# Patient Record
Sex: Female | Born: 1935
Health system: Southern US, Community
[De-identification: ages and names within clinical notes are randomized; demographics above are authoritative.]

## PROBLEM LIST (undated history)

## (undated) DIAGNOSIS — I701 Atherosclerosis of renal artery: Secondary | ICD-10-CM

## (undated) DIAGNOSIS — M1990 Unspecified osteoarthritis, unspecified site: Secondary | ICD-10-CM

## (undated) DIAGNOSIS — G8929 Other chronic pain: Secondary | ICD-10-CM

## (undated) DIAGNOSIS — J449 Chronic obstructive pulmonary disease, unspecified: Secondary | ICD-10-CM

## (undated) DIAGNOSIS — M545 Low back pain, unspecified: Secondary | ICD-10-CM

## (undated) DIAGNOSIS — R51 Headache: Secondary | ICD-10-CM

## (undated) DIAGNOSIS — J189 Pneumonia, unspecified organism: Secondary | ICD-10-CM

## (undated) DIAGNOSIS — D649 Anemia, unspecified: Secondary | ICD-10-CM

## (undated) DIAGNOSIS — K219 Gastro-esophageal reflux disease without esophagitis: Secondary | ICD-10-CM

## (undated) DIAGNOSIS — I10 Essential (primary) hypertension: Secondary | ICD-10-CM

## (undated) DIAGNOSIS — G43909 Migraine, unspecified, not intractable, without status migrainosus: Secondary | ICD-10-CM

## (undated) DIAGNOSIS — E781 Pure hyperglyceridemia: Secondary | ICD-10-CM

## (undated) DIAGNOSIS — H269 Unspecified cataract: Secondary | ICD-10-CM

## (undated) DIAGNOSIS — R519 Headache, unspecified: Secondary | ICD-10-CM

## (undated) DIAGNOSIS — J42 Unspecified chronic bronchitis: Secondary | ICD-10-CM

## (undated) HISTORY — DX: Unspecified cataract: H26.9

## (undated) HISTORY — DX: Essential (primary) hypertension: I10

## (undated) HISTORY — PX: CATARACT EXTRACTION, BILATERAL: SHX1313

## (undated) HISTORY — DX: Atherosclerosis of renal artery: I70.1

## (undated) HISTORY — DX: Unspecified chronic bronchitis: J42

## (undated) HISTORY — PX: TOE SURGERY: SHX1073

## (undated) HISTORY — PX: RENAL ANGIOPLASTY: SHX2316

## (undated) HISTORY — PX: BALLOON DILATION: SHX5330

## (undated) HISTORY — PX: TONSILLECTOMY: SUR1361

---

## 1971-12-01 HISTORY — PX: VAGINAL HYSTERECTOMY: SUR661

## 1999-01-03 ENCOUNTER — Other Ambulatory Visit: Admission: RE | Admit: 1999-01-03 | Discharge: 1999-01-03 | Payer: Self-pay | Admitting: Gastroenterology

## 1999-01-20 ENCOUNTER — Ambulatory Visit (HOSPITAL_COMMUNITY): Admission: RE | Admit: 1999-01-20 | Discharge: 1999-01-20 | Payer: Self-pay | Admitting: Gastroenterology

## 2000-03-29 ENCOUNTER — Other Ambulatory Visit: Admission: RE | Admit: 2000-03-29 | Discharge: 2000-03-29 | Payer: Self-pay | Admitting: Obstetrics and Gynecology

## 2002-06-26 ENCOUNTER — Ambulatory Visit (HOSPITAL_COMMUNITY): Admission: RE | Admit: 2002-06-26 | Discharge: 2002-06-26 | Payer: Self-pay | Admitting: Gastroenterology

## 2002-07-03 ENCOUNTER — Encounter: Admission: RE | Admit: 2002-07-03 | Discharge: 2002-07-03 | Payer: Self-pay | Admitting: Obstetrics and Gynecology

## 2002-07-03 ENCOUNTER — Encounter: Payer: Self-pay | Admitting: Obstetrics and Gynecology

## 2009-04-23 ENCOUNTER — Encounter: Admission: RE | Admit: 2009-04-23 | Discharge: 2009-04-23 | Payer: Self-pay | Admitting: Gastroenterology

## 2011-12-29 DIAGNOSIS — Z124 Encounter for screening for malignant neoplasm of cervix: Secondary | ICD-10-CM | POA: Diagnosis not present

## 2011-12-29 DIAGNOSIS — Z0000 Encounter for general adult medical examination without abnormal findings: Secondary | ICD-10-CM | POA: Diagnosis not present

## 2011-12-29 DIAGNOSIS — Z01419 Encounter for gynecological examination (general) (routine) without abnormal findings: Secondary | ICD-10-CM | POA: Diagnosis not present

## 2012-02-08 DIAGNOSIS — D126 Benign neoplasm of colon, unspecified: Secondary | ICD-10-CM | POA: Diagnosis not present

## 2012-02-08 DIAGNOSIS — R7301 Impaired fasting glucose: Secondary | ICD-10-CM | POA: Diagnosis not present

## 2012-02-08 DIAGNOSIS — I10 Essential (primary) hypertension: Secondary | ICD-10-CM | POA: Diagnosis not present

## 2012-02-08 DIAGNOSIS — E785 Hyperlipidemia, unspecified: Secondary | ICD-10-CM | POA: Diagnosis not present

## 2012-02-19 DIAGNOSIS — Z1231 Encounter for screening mammogram for malignant neoplasm of breast: Secondary | ICD-10-CM | POA: Diagnosis not present

## 2012-04-22 DIAGNOSIS — B079 Viral wart, unspecified: Secondary | ICD-10-CM | POA: Diagnosis not present

## 2012-04-22 DIAGNOSIS — B351 Tinea unguium: Secondary | ICD-10-CM | POA: Diagnosis not present

## 2012-04-22 DIAGNOSIS — L821 Other seborrheic keratosis: Secondary | ICD-10-CM | POA: Diagnosis not present

## 2012-08-09 DIAGNOSIS — I10 Essential (primary) hypertension: Secondary | ICD-10-CM | POA: Diagnosis not present

## 2012-08-09 DIAGNOSIS — M810 Age-related osteoporosis without current pathological fracture: Secondary | ICD-10-CM | POA: Diagnosis not present

## 2012-08-09 DIAGNOSIS — Z0000 Encounter for general adult medical examination without abnormal findings: Secondary | ICD-10-CM | POA: Diagnosis not present

## 2012-08-09 DIAGNOSIS — R7301 Impaired fasting glucose: Secondary | ICD-10-CM | POA: Diagnosis not present

## 2012-08-09 DIAGNOSIS — E785 Hyperlipidemia, unspecified: Secondary | ICD-10-CM | POA: Diagnosis not present

## 2012-08-09 DIAGNOSIS — D649 Anemia, unspecified: Secondary | ICD-10-CM | POA: Diagnosis not present

## 2012-08-12 DIAGNOSIS — Z0000 Encounter for general adult medical examination without abnormal findings: Secondary | ICD-10-CM | POA: Diagnosis not present

## 2012-08-12 DIAGNOSIS — R0602 Shortness of breath: Secondary | ICD-10-CM | POA: Diagnosis not present

## 2012-08-12 DIAGNOSIS — J209 Acute bronchitis, unspecified: Secondary | ICD-10-CM | POA: Diagnosis not present

## 2012-08-12 DIAGNOSIS — I10 Essential (primary) hypertension: Secondary | ICD-10-CM | POA: Diagnosis not present

## 2012-08-16 DIAGNOSIS — Z1212 Encounter for screening for malignant neoplasm of rectum: Secondary | ICD-10-CM | POA: Diagnosis not present

## 2012-09-12 DIAGNOSIS — Z23 Encounter for immunization: Secondary | ICD-10-CM | POA: Diagnosis not present

## 2012-10-25 DIAGNOSIS — Z85828 Personal history of other malignant neoplasm of skin: Secondary | ICD-10-CM | POA: Diagnosis not present

## 2012-10-25 DIAGNOSIS — D1801 Hemangioma of skin and subcutaneous tissue: Secondary | ICD-10-CM | POA: Diagnosis not present

## 2012-10-25 DIAGNOSIS — L821 Other seborrheic keratosis: Secondary | ICD-10-CM | POA: Diagnosis not present

## 2012-10-25 DIAGNOSIS — L820 Inflamed seborrheic keratosis: Secondary | ICD-10-CM | POA: Diagnosis not present

## 2012-10-25 DIAGNOSIS — L570 Actinic keratosis: Secondary | ICD-10-CM | POA: Diagnosis not present

## 2012-12-12 DIAGNOSIS — R7301 Impaired fasting glucose: Secondary | ICD-10-CM | POA: Diagnosis not present

## 2012-12-12 DIAGNOSIS — D649 Anemia, unspecified: Secondary | ICD-10-CM | POA: Diagnosis not present

## 2012-12-12 DIAGNOSIS — E785 Hyperlipidemia, unspecified: Secondary | ICD-10-CM | POA: Diagnosis not present

## 2012-12-12 DIAGNOSIS — R002 Palpitations: Secondary | ICD-10-CM | POA: Diagnosis not present

## 2012-12-27 DIAGNOSIS — H2510 Age-related nuclear cataract, unspecified eye: Secondary | ICD-10-CM | POA: Diagnosis not present

## 2012-12-30 DIAGNOSIS — R339 Retention of urine, unspecified: Secondary | ICD-10-CM | POA: Diagnosis not present

## 2012-12-30 DIAGNOSIS — Z01419 Encounter for gynecological examination (general) (routine) without abnormal findings: Secondary | ICD-10-CM | POA: Diagnosis not present

## 2012-12-30 DIAGNOSIS — Z124 Encounter for screening for malignant neoplasm of cervix: Secondary | ICD-10-CM | POA: Diagnosis not present

## 2013-01-23 DIAGNOSIS — H269 Unspecified cataract: Secondary | ICD-10-CM | POA: Diagnosis not present

## 2013-01-23 DIAGNOSIS — H2510 Age-related nuclear cataract, unspecified eye: Secondary | ICD-10-CM | POA: Diagnosis not present

## 2013-01-24 DIAGNOSIS — H2510 Age-related nuclear cataract, unspecified eye: Secondary | ICD-10-CM | POA: Diagnosis not present

## 2013-02-04 ENCOUNTER — Ambulatory Visit (INDEPENDENT_AMBULATORY_CARE_PROVIDER_SITE_OTHER): Payer: Medicare Other | Admitting: Internal Medicine

## 2013-02-04 VITALS — BP 150/62 | HR 90 | Temp 98.0°F | Resp 18 | Ht 62.0 in | Wt 95.0 lb

## 2013-02-04 DIAGNOSIS — R1310 Dysphagia, unspecified: Secondary | ICD-10-CM | POA: Diagnosis not present

## 2013-02-04 DIAGNOSIS — I10 Essential (primary) hypertension: Secondary | ICD-10-CM

## 2013-02-04 DIAGNOSIS — R0602 Shortness of breath: Secondary | ICD-10-CM | POA: Diagnosis not present

## 2013-02-04 DIAGNOSIS — T17208A Unspecified foreign body in pharynx causing other injury, initial encounter: Secondary | ICD-10-CM

## 2013-02-04 DIAGNOSIS — R070 Pain in throat: Secondary | ICD-10-CM | POA: Diagnosis not present

## 2013-02-06 DIAGNOSIS — I10 Essential (primary) hypertension: Secondary | ICD-10-CM | POA: Insufficient documentation

## 2013-02-06 NOTE — Progress Notes (Signed)
  Subjective:    Patient ID: Jessica [name], female    DOB: [birth year], 79 y.o.   MRN: [id number]  HPI called by staff state patient urgently because of her inability to speak and anxiety Actually she is hoarse/able to whisper/ complaining of pain in throat She swallowed an iron tablet which got lodged in her throat She began having a burning sensation and was coughing and coughed up several fragments of this pill over the next 30 or 40 minutes She continues to describe pain in her throat which gets worse with trying to swallow She is not short of breath  There is no problem list on file for this patient.  Prior to Admission medications   Medication Sig Start Date End Date Taking? Authorizing Provider  fenofibrate 160 MG tablet Take 160 mg by mouth daily.   Yes Historical Provider, MD  iron polysaccharides (NIFEREX) 150 MG capsule Take 150 mg by mouth 2 (two) times daily.   Yes Historical Provider, MD  mirtazapine (REMERON) 15 MG tablet Take 15 mg by mouth at bedtime.   Yes Historical Provider, MD  propranolol (INDERAL) 20 MG tablet Take 15 mg by mouth 3 (three) times daily.   Yes Historical Provider, MD  quinapril (ACCUPRIL) 20 MG tablet Take 20 mg by mouth at bedtime.   Yes Historical Provider, MD   Review of Systems Delayed due to urgency    Objective:   Physical Exam BP 150/62  Pulse 90  Temp(Src) 98 F (36.7 C)  Resp 18  Ht 5\' 2"  (1.575 m)  Wt 95 lb (43.092 kg)  BMI 17.37 kg/m2  SpO2 98% TMs clear/nares clear/oral pharynx clear/no thyromegaly/no nodes/neck has a good range of motion Chest-moving air well in all lungs Has tremendous rhonchi over the neck and anterior chest particularly expiration/no stridor No use of accessory muscles Heart regular without murmur  5 cc of viscous Xylocaine given to swallow No change in symptoms so this was repeated  Reports less discomfort in response/rhonchi  are still present though diminished She is more calm  Had her sip 30 cc of  children's Motrin slowly over 30 minutes Reexam-no longer complaining of pain/breathing easily/voice still hoarse but improving Rhonchi have resolved       Assessment & Plan:  Pain in throat  Shortness of breath  Foreign body in throat, initial encounter  Dysphagia, unspecified  Advised to liquids and soft diet for the next 24 hours No further pills except her blood pressure medicines for the next 2 days Followup with primary care/followup here tomorrow at if not stable

## 2013-02-07 DIAGNOSIS — I10 Essential (primary) hypertension: Secondary | ICD-10-CM | POA: Diagnosis not present

## 2013-02-07 DIAGNOSIS — R1310 Dysphagia, unspecified: Secondary | ICD-10-CM | POA: Diagnosis not present

## 2013-02-07 DIAGNOSIS — K219 Gastro-esophageal reflux disease without esophagitis: Secondary | ICD-10-CM | POA: Diagnosis not present

## 2013-02-07 DIAGNOSIS — J040 Acute laryngitis: Secondary | ICD-10-CM | POA: Diagnosis not present

## 2013-02-07 DIAGNOSIS — R490 Dysphonia: Secondary | ICD-10-CM | POA: Diagnosis not present

## 2013-02-09 DIAGNOSIS — R490 Dysphonia: Secondary | ICD-10-CM | POA: Diagnosis not present

## 2013-02-09 DIAGNOSIS — J020 Streptococcal pharyngitis: Secondary | ICD-10-CM | POA: Diagnosis not present

## 2013-02-09 DIAGNOSIS — K219 Gastro-esophageal reflux disease without esophagitis: Secondary | ICD-10-CM | POA: Diagnosis not present

## 2013-02-09 DIAGNOSIS — J029 Acute pharyngitis, unspecified: Secondary | ICD-10-CM | POA: Diagnosis not present

## 2013-02-09 DIAGNOSIS — R070 Pain in throat: Secondary | ICD-10-CM | POA: Diagnosis not present

## 2013-02-14 DIAGNOSIS — D380 Neoplasm of uncertain behavior of larynx: Secondary | ICD-10-CM | POA: Diagnosis not present

## 2013-02-14 DIAGNOSIS — J040 Acute laryngitis: Secondary | ICD-10-CM | POA: Diagnosis not present

## 2013-02-14 DIAGNOSIS — R490 Dysphonia: Secondary | ICD-10-CM | POA: Diagnosis not present

## 2013-02-14 DIAGNOSIS — J384 Edema of larynx: Secondary | ICD-10-CM | POA: Diagnosis not present

## 2013-02-20 DIAGNOSIS — J384 Edema of larynx: Secondary | ICD-10-CM | POA: Diagnosis not present

## 2013-02-20 DIAGNOSIS — J040 Acute laryngitis: Secondary | ICD-10-CM | POA: Diagnosis not present

## 2013-03-06 DIAGNOSIS — H2510 Age-related nuclear cataract, unspecified eye: Secondary | ICD-10-CM | POA: Diagnosis not present

## 2013-03-06 DIAGNOSIS — H52209 Unspecified astigmatism, unspecified eye: Secondary | ICD-10-CM | POA: Diagnosis not present

## 2013-03-06 DIAGNOSIS — H269 Unspecified cataract: Secondary | ICD-10-CM | POA: Diagnosis not present

## 2013-03-08 DIAGNOSIS — J383 Other diseases of vocal cords: Secondary | ICD-10-CM | POA: Diagnosis not present

## 2013-03-16 DIAGNOSIS — Z1231 Encounter for screening mammogram for malignant neoplasm of breast: Secondary | ICD-10-CM | POA: Diagnosis not present

## 2013-04-03 ENCOUNTER — Other Ambulatory Visit: Payer: Self-pay | Admitting: Nurse Practitioner

## 2013-04-03 NOTE — Telephone Encounter (Signed)
Patient requests rx for Estrace vaginal cream/[facility] [address] [phone].

## 2013-04-03 NOTE — Telephone Encounter (Signed)
Refill request for ESTRACE CREAM Last filled-12/29/11, #1 X 12 Last AEX - 12/12/12 Next AEX - not scheduled Please advise refills.

## 2013-04-04 MED ORDER — ESTRADIOL 0.1 MG/GM VA CREA: TOPICAL_CREAM | VAGINAL | Status: DC

## 2013-04-04 NOTE — Telephone Encounter (Signed)
Ok to refill vaginal estrogen for the 3 months and she can come in the interim for AEX.

## 2013-04-04 NOTE — Telephone Encounter (Signed)
Pt notified and RX sent. 

## 2013-04-19 DIAGNOSIS — R498 Other voice and resonance disorders: Secondary | ICD-10-CM | POA: Diagnosis not present

## 2013-04-19 DIAGNOSIS — J383 Other diseases of vocal cords: Secondary | ICD-10-CM | POA: Diagnosis not present

## 2013-04-19 DIAGNOSIS — R490 Dysphonia: Secondary | ICD-10-CM | POA: Diagnosis not present

## 2013-06-26 DIAGNOSIS — L820 Inflamed seborrheic keratosis: Secondary | ICD-10-CM | POA: Diagnosis not present

## 2013-06-26 DIAGNOSIS — L821 Other seborrheic keratosis: Secondary | ICD-10-CM | POA: Diagnosis not present

## 2013-06-26 DIAGNOSIS — L819 Disorder of pigmentation, unspecified: Secondary | ICD-10-CM | POA: Diagnosis not present

## 2013-06-26 DIAGNOSIS — Z85828 Personal history of other malignant neoplasm of skin: Secondary | ICD-10-CM | POA: Diagnosis not present

## 2013-06-26 DIAGNOSIS — D1801 Hemangioma of skin and subcutaneous tissue: Secondary | ICD-10-CM | POA: Diagnosis not present

## 2013-06-26 DIAGNOSIS — L570 Actinic keratosis: Secondary | ICD-10-CM | POA: Diagnosis not present

## 2013-06-26 DIAGNOSIS — L719 Rosacea, unspecified: Secondary | ICD-10-CM | POA: Diagnosis not present

## 2013-08-11 DIAGNOSIS — R7301 Impaired fasting glucose: Secondary | ICD-10-CM | POA: Diagnosis not present

## 2013-08-11 DIAGNOSIS — M899 Disorder of bone, unspecified: Secondary | ICD-10-CM | POA: Diagnosis not present

## 2013-08-11 DIAGNOSIS — E785 Hyperlipidemia, unspecified: Secondary | ICD-10-CM | POA: Diagnosis not present

## 2013-08-11 DIAGNOSIS — I10 Essential (primary) hypertension: Secondary | ICD-10-CM | POA: Diagnosis not present

## 2013-08-24 DIAGNOSIS — M810 Age-related osteoporosis without current pathological fracture: Secondary | ICD-10-CM | POA: Diagnosis not present

## 2013-08-24 DIAGNOSIS — I701 Atherosclerosis of renal artery: Secondary | ICD-10-CM | POA: Diagnosis not present

## 2013-08-24 DIAGNOSIS — F329 Major depressive disorder, single episode, unspecified: Secondary | ICD-10-CM | POA: Diagnosis not present

## 2013-08-24 DIAGNOSIS — Z0000 Encounter for general adult medical examination without abnormal findings: Secondary | ICD-10-CM | POA: Diagnosis not present

## 2013-08-24 DIAGNOSIS — Z23 Encounter for immunization: Secondary | ICD-10-CM | POA: Diagnosis not present

## 2013-08-24 DIAGNOSIS — F3289 Other specified depressive episodes: Secondary | ICD-10-CM | POA: Diagnosis not present

## 2013-08-24 DIAGNOSIS — D126 Benign neoplasm of colon, unspecified: Secondary | ICD-10-CM | POA: Diagnosis not present

## 2013-08-24 DIAGNOSIS — R002 Palpitations: Secondary | ICD-10-CM | POA: Diagnosis not present

## 2013-08-24 DIAGNOSIS — D649 Anemia, unspecified: Secondary | ICD-10-CM | POA: Diagnosis not present

## 2013-08-24 DIAGNOSIS — E785 Hyperlipidemia, unspecified: Secondary | ICD-10-CM | POA: Diagnosis not present

## 2013-09-13 DIAGNOSIS — Z1212 Encounter for screening for malignant neoplasm of rectum: Secondary | ICD-10-CM | POA: Diagnosis not present

## 2013-10-17 DIAGNOSIS — Z961 Presence of intraocular lens: Secondary | ICD-10-CM | POA: Diagnosis not present

## 2013-10-23 DIAGNOSIS — J383 Other diseases of vocal cords: Secondary | ICD-10-CM | POA: Diagnosis not present

## 2014-02-21 DIAGNOSIS — F329 Major depressive disorder, single episode, unspecified: Secondary | ICD-10-CM | POA: Diagnosis not present

## 2014-02-21 DIAGNOSIS — E785 Hyperlipidemia, unspecified: Secondary | ICD-10-CM | POA: Diagnosis not present

## 2014-02-21 DIAGNOSIS — G43909 Migraine, unspecified, not intractable, without status migrainosus: Secondary | ICD-10-CM | POA: Diagnosis not present

## 2014-02-21 DIAGNOSIS — F3289 Other specified depressive episodes: Secondary | ICD-10-CM | POA: Diagnosis not present

## 2014-02-21 DIAGNOSIS — R7301 Impaired fasting glucose: Secondary | ICD-10-CM | POA: Diagnosis not present

## 2014-02-21 DIAGNOSIS — I701 Atherosclerosis of renal artery: Secondary | ICD-10-CM | POA: Diagnosis not present

## 2014-02-21 DIAGNOSIS — Z1331 Encounter for screening for depression: Secondary | ICD-10-CM | POA: Diagnosis not present

## 2014-02-21 DIAGNOSIS — M1990 Unspecified osteoarthritis, unspecified site: Secondary | ICD-10-CM | POA: Diagnosis not present

## 2014-02-21 DIAGNOSIS — I10 Essential (primary) hypertension: Secondary | ICD-10-CM | POA: Diagnosis not present

## 2014-03-27 DIAGNOSIS — Z1231 Encounter for screening mammogram for malignant neoplasm of breast: Secondary | ICD-10-CM | POA: Diagnosis not present

## 2014-04-25 DIAGNOSIS — R0982 Postnasal drip: Secondary | ICD-10-CM | POA: Insufficient documentation

## 2014-04-25 DIAGNOSIS — J384 Edema of larynx: Secondary | ICD-10-CM | POA: Diagnosis not present

## 2014-04-25 DIAGNOSIS — R1310 Dysphagia, unspecified: Secondary | ICD-10-CM | POA: Insufficient documentation

## 2014-04-25 DIAGNOSIS — J383 Other diseases of vocal cords: Secondary | ICD-10-CM | POA: Diagnosis not present

## 2014-04-25 DIAGNOSIS — J329 Chronic sinusitis, unspecified: Secondary | ICD-10-CM | POA: Diagnosis not present

## 2014-05-09 ENCOUNTER — Ambulatory Visit (INDEPENDENT_AMBULATORY_CARE_PROVIDER_SITE_OTHER): Payer: Medicare Other | Admitting: Nurse Practitioner

## 2014-05-09 ENCOUNTER — Encounter: Payer: Self-pay | Admitting: Nurse Practitioner

## 2014-05-09 VITALS — BP 108/66 | HR 60 | Ht 61.5 in | Wt 95.0 lb

## 2014-05-09 DIAGNOSIS — R519 Headache, unspecified: Secondary | ICD-10-CM

## 2014-05-09 DIAGNOSIS — M545 Low back pain, unspecified: Secondary | ICD-10-CM | POA: Diagnosis not present

## 2014-05-09 DIAGNOSIS — R51 Headache: Secondary | ICD-10-CM | POA: Diagnosis not present

## 2014-05-09 DIAGNOSIS — Z124 Encounter for screening for malignant neoplasm of cervix: Secondary | ICD-10-CM | POA: Diagnosis not present

## 2014-05-09 DIAGNOSIS — N952 Postmenopausal atrophic vaginitis: Secondary | ICD-10-CM | POA: Diagnosis not present

## 2014-05-09 DIAGNOSIS — Z01419 Encounter for gynecological examination (general) (routine) without abnormal findings: Secondary | ICD-10-CM

## 2014-05-09 DIAGNOSIS — G8929 Other chronic pain: Secondary | ICD-10-CM | POA: Insufficient documentation

## 2014-05-09 DIAGNOSIS — M5416 Radiculopathy, lumbar region: Secondary | ICD-10-CM | POA: Insufficient documentation

## 2014-05-09 NOTE — Progress Notes (Signed)
Patient ID: Jessica [name], female   DOB: 1932/07/17, 80 y.o.   MRN: [id number] 80 y.o. G1P1001 Windowed Caucasian Fe here for annual exam. Husband died 2012/08/21 from CHF and respiratory distress.  Dr Forde Dandy is following for her chronic anemia.   Recent CBC and anemia was better at [facility]EX in 08/22/23.  Last year had taken an iron tablet that lodged in her throat.  It broke apart and burned the esophagus.  She ended up seeing specialist at [facility].  Could not talk for about a month last spring.   Patient's last menstrual period was 11/30/1976.          Sexually active: yes  The current method of family planning is status post hysterectomy.    Exercising: yes  Home exercise routine includes walking 4-5 times per week. Smoker:  no  Health Maintenance: Pap:  03/29/00, WNL, post hyst MMG:  03/16/13, Bi-Rads 1: negative Colonoscopy:  2008, Medoff, repeat in 5 years BMD:  07/2011, -1.9/-1.3/-0.9 TDaP:  ? 2004 Shingles: 2011 Labs:  PCP   reports that she has been smoking.  She does not have any smokeless tobacco history on file. She reports that she drinks alcohol. She reports that she does not use illicit drugs.  Past Medical History  Diagnosis Date  . Hypertension   . Bilateral renal artery stenosis     Past Surgical History  Procedure Laterality Date  . Total vaginal hysterectomy  1978    Current Outpatient Prescriptions  Medication Sig Dispense Refill  . aspirin EC 81 MG tablet Take 81 mg by mouth daily.      . fenofibrate 160 MG tablet Take 160 mg by mouth daily.      Marland Kitchen HYDROcodone-acetaminophen (NORCO/VICODIN) 5-325 MG per tablet Take 1 tablet by mouth as needed.      . mirtazapine (REMERON) 15 MG tablet Take 15 mg by mouth at bedtime.      . propranolol (INDERAL) 20 MG tablet Take 15 mg by mouth daily.       . quinapril (ACCUPRIL) 20 MG tablet Take 20 mg by mouth at bedtime.      Marland Kitchen estradiol (ESTRACE) 0.1 MG/GM vaginal cream Place 1 gram intravaginally two times per week as directed  43 g  3    No current facility-administered medications for this visit.    No family history on file.  ROS:  Pertinent items are noted in HPI.  Otherwise, a comprehensive ROS was negative.  Exam:   BP 108/66  Pulse 60  Ht 5' 1.5" (1.562 m)  Wt 95 lb (43.092 kg)  BMI 17.66 kg/m2  LMP 11/30/1976 Height: 5' 1.5" (156.2 cm)  Ht Readings from Last 3 Encounters:  05/09/14 5' 1.5" (1.562 m)  02/04/13 5\' 2"  (1.575 m)    General appearance: alert, cooperative and appears stated age, memory issues with some things.  She is able to live alone and functions OK. Head: Normocephalic, without obvious abnormality, atraumatic Neck: no adenopathy, supple, symmetrical, trachea midline and thyroid normal to inspection and palpation Lungs: clear to auscultation bilaterally Breasts: normal appearance, no masses or tenderness Heart: regular rate and rhythm Abdomen: soft, non-tender; no masses,  no organomegaly Extremities: extremities normal, atraumatic, no cyanosis or edema Skin: Skin color, texture, turgor normal. No rashes or lesions Lymph nodes: Cervical, supraclavicular, and axillary nodes normal. No abnormal inguinal nodes palpated Neurologic: Grossly normal   Pelvic: External genitalia:  no lesions  Urethra:  Pale but not prolapsed appearing urethra with no masses, tenderness or lesions              Bartholin's and Skene's: normal                 Vagina: normal appearing vagina with normal color and discharge, no lesions              Cervix: absent              Pap taken: no Bimanual Exam:  Uterus:  uterus absent              Adnexa: no mass, fullness, tenderness               Rectovaginal: Confirms               Anus:  normal sphincter tone, no lesions  A:  Well Woman with normal exam  Postmenopausal  off ERT 1993- 2004  S/P TVH  Atrophic vaginitis - off estrogen cream for a year  HTN  Osteoporosis - off Fosamax - PCP is following      P:   Reviewed health and wellness  pertinent to exam  Pap smear taken today  Mammogram is due 4/16 per pt.  Will get ROI for 2015 Mammo  Counseled on breast self exam, mammography screening, adequate intake of calcium and vitamin D, diet and exercise, Kegel's exercises return annually or prn  An After Visit Summary was printed and given to the patient.

## 2014-05-09 NOTE — Patient Instructions (Signed)

## 2014-05-14 NOTE — Progress Notes (Signed)
Encounter reviewed by Dr. Brook Silva.  

## 2014-06-18 DIAGNOSIS — L820 Inflamed seborrheic keratosis: Secondary | ICD-10-CM | POA: Diagnosis not present

## 2014-06-18 DIAGNOSIS — D1801 Hemangioma of skin and subcutaneous tissue: Secondary | ICD-10-CM | POA: Diagnosis not present

## 2014-06-18 DIAGNOSIS — L821 Other seborrheic keratosis: Secondary | ICD-10-CM | POA: Diagnosis not present

## 2014-06-18 DIAGNOSIS — D239 Other benign neoplasm of skin, unspecified: Secondary | ICD-10-CM | POA: Diagnosis not present

## 2014-06-18 DIAGNOSIS — B351 Tinea unguium: Secondary | ICD-10-CM | POA: Diagnosis not present

## 2014-06-18 DIAGNOSIS — B079 Viral wart, unspecified: Secondary | ICD-10-CM | POA: Diagnosis not present

## 2014-06-18 DIAGNOSIS — L819 Disorder of pigmentation, unspecified: Secondary | ICD-10-CM | POA: Diagnosis not present

## 2014-06-18 DIAGNOSIS — Z85828 Personal history of other malignant neoplasm of skin: Secondary | ICD-10-CM | POA: Diagnosis not present

## 2014-08-20 DIAGNOSIS — I10 Essential (primary) hypertension: Secondary | ICD-10-CM | POA: Diagnosis not present

## 2014-08-20 DIAGNOSIS — M810 Age-related osteoporosis without current pathological fracture: Secondary | ICD-10-CM | POA: Diagnosis not present

## 2014-08-20 DIAGNOSIS — R7301 Impaired fasting glucose: Secondary | ICD-10-CM | POA: Diagnosis not present

## 2014-08-27 DIAGNOSIS — I10 Essential (primary) hypertension: Secondary | ICD-10-CM | POA: Diagnosis not present

## 2014-08-27 DIAGNOSIS — I701 Atherosclerosis of renal artery: Secondary | ICD-10-CM | POA: Diagnosis not present

## 2014-08-27 DIAGNOSIS — Z23 Encounter for immunization: Secondary | ICD-10-CM | POA: Diagnosis not present

## 2014-08-27 DIAGNOSIS — R7301 Impaired fasting glucose: Secondary | ICD-10-CM | POA: Diagnosis not present

## 2014-08-27 DIAGNOSIS — Z0000 Encounter for general adult medical examination without abnormal findings: Secondary | ICD-10-CM | POA: Diagnosis not present

## 2014-08-27 DIAGNOSIS — M810 Age-related osteoporosis without current pathological fracture: Secondary | ICD-10-CM | POA: Diagnosis not present

## 2014-08-27 DIAGNOSIS — E785 Hyperlipidemia, unspecified: Secondary | ICD-10-CM | POA: Diagnosis not present

## 2014-08-27 DIAGNOSIS — G43909 Migraine, unspecified, not intractable, without status migrainosus: Secondary | ICD-10-CM | POA: Diagnosis not present

## 2014-08-27 DIAGNOSIS — R0602 Shortness of breath: Secondary | ICD-10-CM | POA: Diagnosis not present

## 2014-08-28 DIAGNOSIS — Z1212 Encounter for screening for malignant neoplasm of rectum: Secondary | ICD-10-CM | POA: Diagnosis not present

## 2014-09-03 ENCOUNTER — Telehealth: Payer: Self-pay | Admitting: Nurse Practitioner

## 2014-09-03 ENCOUNTER — Telehealth: Payer: Self-pay

## 2014-09-03 DIAGNOSIS — R300 Dysuria: Secondary | ICD-10-CM | POA: Diagnosis not present

## 2014-09-03 DIAGNOSIS — R8299 Other abnormal findings in urine: Secondary | ICD-10-CM | POA: Diagnosis not present

## 2014-09-03 NOTE — Telephone Encounter (Signed)
See next telephone encounter from 10/5.  Routing to provider for final review. Patient agreeable to disposition. Will close encounter

## 2014-09-03 NOTE — Telephone Encounter (Signed)
Patient calling stating she wants an appointment with Waldemar Dickens, NP due to "having spotting when urinating two times yesterday." She saw PCP and didn't have a UTI. Please advise?

## 2014-09-03 NOTE — Telephone Encounter (Signed)
Spoke with patient at time of incoming call. Patient states that she had bleeding twice yesterday with urination. "It really concerned me." Patient was seen with PCP today and urine culture was negative. Patient denies any pain or changes with urination. "I have had problems with vaginal dryness and some bleeding before. It may be that." Patient is requesting to see Milford Cage, FNP for evaluation. Appointment scheduled for tomorrow at 10:30am with Milford Cage, [place]. Patient is agreeable to date and time.  Routing to provider for final review. Patient agreeable to disposition. Will close encounter ;

## 2014-09-04 ENCOUNTER — Encounter: Payer: Self-pay | Admitting: Nurse Practitioner

## 2014-09-04 ENCOUNTER — Ambulatory Visit (INDEPENDENT_AMBULATORY_CARE_PROVIDER_SITE_OTHER): Payer: Medicare Other | Admitting: Nurse Practitioner

## 2014-09-04 VITALS — BP 140/82 | HR 72 | Ht 61.5 in | Wt 94.0 lb

## 2014-09-04 DIAGNOSIS — R319 Hematuria, unspecified: Secondary | ICD-10-CM

## 2014-09-04 DIAGNOSIS — S30814A Abrasion of vagina and vulva, initial encounter: Secondary | ICD-10-CM

## 2014-09-04 MED ORDER — ESTRADIOL 0.1 MG/GM VA CREA: TOPICAL_CREAM | VAGINAL | Status: DC

## 2014-09-04 NOTE — Progress Notes (Signed)
Subjective:     Patient ID: Jessica [name], female   DOB: [birth year], 80 y.o.   MRN: [id number]  HPI This 80 yo WD Fe presents with history of vaginal bleeding.  On Saturday pm was SA with partner.  She felt some dryness initially that soon improved.  She had been off her vaginal estrogen cream for several months.  He has erectile dysfunction and has to use a pump.  The ring of the pump is at the base of the penis and she felt this was uncomfortable for her.  The next day had hematuria without dysuria.  No fever or chills.  No abdominal pain.  She went to her PCP yesterday and urine was clear and she was told to monitor the symptoms.  She felt she should have further investigation and came here.  She has no vaginal pain or hematuria now.  She was just uncomfortable with that amount of bleeding at one time.  She was also pushing to have a BM at the same time.  The bleeding was not rectal.   Nothing since then.  Urine at PCP yesterday was normal.  No urine for C&S was done.   She is S/P  [facility].   Review of Systems  Constitutional: Negative for fever, chills and fatigue.  Respiratory: Negative.   Cardiovascular: Negative.   Gastrointestinal: Negative.  Negative for nausea, vomiting, abdominal pain, diarrhea, constipation and blood in stool.  Genitourinary: Positive for hematuria, vaginal bleeding, vaginal pain and dyspareunia. Negative for dysuria, urgency, frequency, flank pain, vaginal discharge, genital sores and pelvic pain.  Musculoskeletal: Positive for arthralgias.  Skin: Negative.   Neurological: Negative.   Psychiatric/Behavioral: Negative.        Objective:   Physical Exam  Constitutional: She is oriented to person, place, and time. She appears well-developed and well-nourished. No distress.  Abdominal: Soft. She exhibits no distension. There is no tenderness. There is no rebound and no guarding.  No flank pain  Genitourinary:  There is a tear at 6:00 position of the introitus.  No  bleeding at this time. No other vaginal abrasions or lesions.  No vaginal discharge.   Urethra is not red, friable, or lesions.  She does have atrophic findings. Uterus is absent.  Neurological: She is alert and oriented to person, place, and time.  Skin: Skin is warm and dry.  Psychiatric: She has a normal mood and affect. Her behavior is normal. Thought content normal.       Assessment:     Atrophic vaginitis Vaginal trauma with bleeding after SA Partner with use of penile pump with ring at the introitus most likely causing the abrasion S/P TVH 1973    Plan:     Will have her restart the vaginal estrogen cream 1/2 gm three times a week initially then twice weekly in a month Counseled her with risk of DVT, CVA, cancer, etc

## 2014-09-05 LAB — URINALYSIS, MICROSCOPIC ONLY
Bacteria, UA: NONE SEEN
CASTS: NONE SEEN
CRYSTALS: NONE SEEN
SQUAMOUS EPITHELIAL / LPF: NONE SEEN

## 2014-09-06 LAB — URINE CULTURE
COLONY COUNT: NO GROWTH
Organism ID, Bacteria: NO GROWTH

## 2014-09-09 NOTE — Progress Notes (Signed)
Encounter reviewed by Dr. Brook Silva.  

## 2014-10-01 ENCOUNTER — Encounter: Payer: Self-pay | Admitting: Nurse Practitioner

## 2014-10-19 DIAGNOSIS — Z961 Presence of intraocular lens: Secondary | ICD-10-CM | POA: Diagnosis not present

## 2014-10-19 DIAGNOSIS — I10 Essential (primary) hypertension: Secondary | ICD-10-CM | POA: Diagnosis not present

## 2014-10-19 DIAGNOSIS — H02839 Dermatochalasis of unspecified eye, unspecified eyelid: Secondary | ICD-10-CM | POA: Diagnosis not present

## 2014-10-24 DIAGNOSIS — R319 Hematuria, unspecified: Secondary | ICD-10-CM | POA: Diagnosis not present

## 2014-10-24 DIAGNOSIS — R350 Frequency of micturition: Secondary | ICD-10-CM | POA: Diagnosis not present

## 2014-10-24 DIAGNOSIS — N390 Urinary tract infection, site not specified: Secondary | ICD-10-CM | POA: Diagnosis not present

## 2015-02-25 DIAGNOSIS — Z1389 Encounter for screening for other disorder: Secondary | ICD-10-CM | POA: Diagnosis not present

## 2015-02-25 DIAGNOSIS — D126 Benign neoplasm of colon, unspecified: Secondary | ICD-10-CM | POA: Diagnosis not present

## 2015-02-25 DIAGNOSIS — J309 Allergic rhinitis, unspecified: Secondary | ICD-10-CM | POA: Diagnosis not present

## 2015-02-25 DIAGNOSIS — R7301 Impaired fasting glucose: Secondary | ICD-10-CM | POA: Diagnosis not present

## 2015-02-25 DIAGNOSIS — F329 Major depressive disorder, single episode, unspecified: Secondary | ICD-10-CM | POA: Diagnosis not present

## 2015-02-25 DIAGNOSIS — I10 Essential (primary) hypertension: Secondary | ICD-10-CM | POA: Diagnosis not present

## 2015-02-25 DIAGNOSIS — E785 Hyperlipidemia, unspecified: Secondary | ICD-10-CM | POA: Diagnosis not present

## 2015-02-25 DIAGNOSIS — R002 Palpitations: Secondary | ICD-10-CM | POA: Diagnosis not present

## 2015-02-25 DIAGNOSIS — M810 Age-related osteoporosis without current pathological fracture: Secondary | ICD-10-CM | POA: Diagnosis not present

## 2015-05-02 DIAGNOSIS — Z1231 Encounter for screening mammogram for malignant neoplasm of breast: Secondary | ICD-10-CM | POA: Diagnosis not present

## 2015-05-14 ENCOUNTER — Encounter: Payer: Self-pay | Admitting: Nurse Practitioner

## 2015-05-14 ENCOUNTER — Ambulatory Visit (INDEPENDENT_AMBULATORY_CARE_PROVIDER_SITE_OTHER): Payer: Medicare Other | Admitting: Nurse Practitioner

## 2015-05-14 VITALS — BP 120/76 | HR 64 | Ht 61.5 in | Wt 94.0 lb

## 2015-05-14 DIAGNOSIS — Z01419 Encounter for gynecological examination (general) (routine) without abnormal findings: Secondary | ICD-10-CM

## 2015-05-14 NOTE — Patient Instructions (Addendum)

## 2015-05-14 NOTE — Progress Notes (Signed)
Patient ID: Jessica [name], female   DOB: 02/24/1932, 81 y.o.   MRN: [id number] 81 y.o. G1P1001 Widowed Caucasian Fe here for annual exam.  Since last year voice quality is a little better but still raspy.  She had a lodged iron tablet in her throat that caused esophageal erosion.  Her 2 neighbor friends have passed this year and she is very sad about that.  Patient's last menstrual period was 11/30/1976.          Sexually active: Yes.    The current method of family planning is status post hysterectomy.    Exercising: Yes.    walking occasionally and housework Smoker:  no  Health Maintenance: Pap:  03/29/00, negative, (hysterectomy) MMG:  03/27/14, 3D, Bi-Rads 2:  Benign, [facility] - repeated 03/2015 - report is pending Colonoscopy:  2008, Medoff, repeat in 5 years BMD:   07/2011, T Score -1.9 S/-1.3 R/-0.9 L TDaP:  ? 2011 Shingles: up to date Prevnar 32:  Has not been done yet Labs:  PCP   reports that she has quit smoking. She has never used smokeless tobacco. She reports that she drinks alcohol. She reports that she does not use illicit drugs.  Past Medical History  Diagnosis Date  . Hypertension   . Bilateral renal artery stenosis     Past Surgical History  Procedure Laterality Date  . Total vaginal hysterectomy  1973    Current Outpatient Prescriptions  Medication Sig Dispense Refill  . aspirin EC 81 MG tablet Take 81 mg by mouth daily.    Marland Kitchen estradiol (ESTRACE) 0.1 MG/GM vaginal cream Use 1/2 g vaginally three times weekly 42.5 g 3  . fenofibrate 160 MG tablet Take 160 mg by mouth daily.    Marland Kitchen HYDROcodone-acetaminophen (NORCO/VICODIN) 5-325 MG per tablet Take 1 tablet by mouth as needed.    . mirtazapine (REMERON) 15 MG tablet Take 15 mg by mouth at bedtime.    . propranolol ER (INDERAL LA) 120 MG 24 hr capsule Take 120 mg by mouth daily.  3  . quinapril (ACCUPRIL) 20 MG tablet Take 20 mg by mouth at bedtime.    . Vitamin D, Ergocalciferol, (DRISDOL) 50000 UNITS CAPS capsule Take 1  capsule by mouth once a week.     No current facility-administered medications for this visit.    History reviewed. No pertinent family history.  ROS:  Pertinent items are noted in HPI.  Otherwise, a comprehensive ROS was negative.  Exam:   BP 120/76 mmHg  Pulse 64  Ht 5' 1.5" (1.562 m)  Wt 94 lb (42.638 kg)  BMI 17.48 kg/m2  LMP 11/30/1976 Height: 5' 1.5" (156.2 cm) Ht Readings from Last 3 Encounters:  05/14/15 5' 1.5" (1.562 m)  09/04/14 5' 1.5" (1.562 m)  05/09/14 5' 1.5" (1.562 m)    General appearance: alert, cooperative and appears stated age Head: Normocephalic, without obvious abnormality, atraumatic Neck: no adenopathy, supple, symmetrical, trachea midline and thyroid normal to inspection and palpation Lungs: clear to auscultation bilaterally Breasts: normal appearance, no masses or tenderness Heart: regular rate and rhythm Abdomen: soft, non-tender; no masses,  no organomegaly Extremities: extremities normal, atraumatic, no cyanosis or edema Skin: Skin color, texture, turgor normal. No rashes or lesions Lymph nodes: Cervical, supraclavicular, and axillary nodes normal. No abnormal inguinal nodes palpated Neurologic: Grossly normal   Pelvic: External genitalia:  no lesions              Urethra:  normal appearing urethra with no masses, tenderness  or lesions              Bartholin's and Skene's: normal                 Vagina: normal appearing vagina with normal color and discharge, no lesions              Cervix: absent              Pap taken: No. Bimanual Exam:  Uterus:  uterus absent              Adnexa: no mass, fullness, tenderness               Rectovaginal: Confirms               Anus:  normal sphincter tone, no lesions  Chaperone present:  no  A:  Well Woman with normal exam  Postmenopausal off ERT 1993- 2004 S/P TVH 1973 Atrophic vaginitis - off estrogen cream for a year HTN Osteoporosis - off  Fosamax - PCP is following   P:   Reviewed health and wellness pertinent to exam  Pap smear as above  Mammogram is due 03/2016 - ROI for current Mammo  Counseled on breast self exam, mammography screening, use and side effects of HRT, adequate intake of calcium and vitamin D, diet and exercise return annually or prn  An After Visit Summary was printed and given to the patient.

## 2015-05-16 NOTE — Progress Notes (Signed)
Reviewed personally.  M. Suzanne Husein Guedes, MD.  

## 2015-06-26 ENCOUNTER — Encounter (HOSPITAL_COMMUNITY): Payer: Self-pay | Admitting: *Deleted

## 2015-06-26 ENCOUNTER — Inpatient Hospital Stay (HOSPITAL_COMMUNITY)
Admission: EM | Admit: 2015-06-26 | Discharge: 2015-06-28 | DRG: 069 | Disposition: A | Discharge status: Home / Self Care | Payer: Medicare Other | Attending: Internal Medicine | Admitting: Internal Medicine

## 2015-06-26 DIAGNOSIS — G451 Carotid artery syndrome (hemispheric): Secondary | ICD-10-CM | POA: Diagnosis not present

## 2015-06-26 DIAGNOSIS — I509 Heart failure, unspecified: Secondary | ICD-10-CM | POA: Diagnosis not present

## 2015-06-26 DIAGNOSIS — Z87891 Personal history of nicotine dependence: Secondary | ICD-10-CM

## 2015-06-26 DIAGNOSIS — R200 Anesthesia of skin: Secondary | ICD-10-CM | POA: Diagnosis not present

## 2015-06-26 DIAGNOSIS — I10 Essential (primary) hypertension: Secondary | ICD-10-CM | POA: Diagnosis not present

## 2015-06-26 DIAGNOSIS — E781 Pure hyperglyceridemia: Secondary | ICD-10-CM | POA: Diagnosis present

## 2015-06-26 DIAGNOSIS — G8929 Other chronic pain: Secondary | ICD-10-CM | POA: Diagnosis present

## 2015-06-26 DIAGNOSIS — I169 Hypertensive crisis, unspecified: Secondary | ICD-10-CM

## 2015-06-26 DIAGNOSIS — Z8679 Personal history of other diseases of the circulatory system: Secondary | ICD-10-CM

## 2015-06-26 DIAGNOSIS — M1990 Unspecified osteoarthritis, unspecified site: Secondary | ICD-10-CM | POA: Diagnosis present

## 2015-06-26 DIAGNOSIS — R531 Weakness: Secondary | ICD-10-CM | POA: Diagnosis not present

## 2015-06-26 DIAGNOSIS — K219 Gastro-esophageal reflux disease without esophagitis: Secondary | ICD-10-CM | POA: Diagnosis present

## 2015-06-26 DIAGNOSIS — I77811 Abdominal aortic ectasia: Secondary | ICD-10-CM | POA: Diagnosis not present

## 2015-06-26 DIAGNOSIS — E785 Hyperlipidemia, unspecified: Secondary | ICD-10-CM

## 2015-06-26 DIAGNOSIS — I701 Atherosclerosis of renal artery: Secondary | ICD-10-CM | POA: Diagnosis present

## 2015-06-26 DIAGNOSIS — I6523 Occlusion and stenosis of bilateral carotid arteries: Secondary | ICD-10-CM | POA: Diagnosis not present

## 2015-06-26 DIAGNOSIS — G43909 Migraine, unspecified, not intractable, without status migrainosus: Secondary | ICD-10-CM | POA: Diagnosis present

## 2015-06-26 DIAGNOSIS — Z90710 Acquired absence of both cervix and uterus: Secondary | ICD-10-CM

## 2015-06-26 DIAGNOSIS — M545 Low back pain: Secondary | ICD-10-CM | POA: Diagnosis present

## 2015-06-26 DIAGNOSIS — G459 Transient cerebral ischemic attack, unspecified: Secondary | ICD-10-CM | POA: Diagnosis not present

## 2015-06-26 DIAGNOSIS — I6789 Other cerebrovascular disease: Secondary | ICD-10-CM | POA: Diagnosis not present

## 2015-06-26 DIAGNOSIS — I160 Hypertensive urgency: Secondary | ICD-10-CM

## 2015-06-26 DIAGNOSIS — I129 Hypertensive chronic kidney disease with stage 1 through stage 4 chronic kidney disease, or unspecified chronic kidney disease: Secondary | ICD-10-CM | POA: Diagnosis not present

## 2015-06-26 DIAGNOSIS — G450 Vertebro-basilar artery syndrome: Secondary | ICD-10-CM | POA: Diagnosis not present

## 2015-06-26 HISTORY — DX: Other chronic pain: G89.29

## 2015-06-26 HISTORY — DX: Gastro-esophageal reflux disease without esophagitis: K21.9

## 2015-06-26 HISTORY — DX: Headache, unspecified: R51.9

## 2015-06-26 HISTORY — DX: Low back pain: M54.5

## 2015-06-26 HISTORY — DX: Anemia, unspecified: D64.9

## 2015-06-26 HISTORY — DX: Headache: R51

## 2015-06-26 HISTORY — DX: Pure hyperglyceridemia: E78.1

## 2015-06-26 HISTORY — DX: Unspecified osteoarthritis, unspecified site: M19.90

## 2015-06-26 HISTORY — DX: Low back pain, unspecified: M54.50

## 2015-06-26 HISTORY — DX: Migraine, unspecified, not intractable, without status migrainosus: G43.909

## 2015-06-26 NOTE — ED Notes (Signed)
Pt to ED home via [facility] c/o numbness to R hand and face onset at 9pm, lasted approximately 5 mins, then resolved. At 11pm, Pt had numbness to R leg that has since resolved. Pt noted to be hypertensive at 239/109; symptoms have resolved at this time

## 2015-06-26 NOTE — ED Provider Notes (Addendum)
CSN: [id number]     Arrival date & time 06/26/15  2341 History  This chart was scribed for Linton Flemings, MD by Mercy Moore, ED scribe.  This patient was seen in room D31C/D31C and the patient's care was started at 12:00 AM.   Chief Complaint  Patient presents with  . Numbness    The history is provided by the EMS personnel. No language interpreter was used.   HPI Comments: Jessica [name] is a 81 y.o. female brought in by ambulance, who presents to the Emergency Department complaining of six total episodes of numbness this week to right first and second fingers, mouth and tongue. Patient describes fleeting episodes each enduring for approximately five minutes. Patient reports onset of right leg and foot weakness tonight, five hours ago, when getting up to let her dog out. Patient characterizes sensation as "not being able to pick up her foot." Patient reports full resolution since her episode tonight.   Patient reports remote history of renal artery stenosis 40 years ago for which she takes propanolol and  quinapril to control her blood pressure. Patient does not monitor her blood pressure at home.  Patient states that sees her PCP every six months and her blood pressure was normal in March at her last visit. Patient states that she has taken her medication as directed today. Patient denies numbness, shortness of breath, chest pain, weakness or numbness.  PCP: Dr. Reynold Bowen      Past Medical History  Diagnosis Date  . Hypertension   . Bilateral renal artery stenosis    Past Surgical History  Procedure Laterality Date  . Total vaginal hysterectomy  1973  . Balloon dilation    . Foot surgery     History reviewed. No pertinent family history. History  Substance Use Topics  . Smoking status: Former Research scientist (life sciences)  . Smokeless tobacco: Never Used  . Alcohol Use: 0.0 - 1.2 oz/week    0-2 Standard drinks or equivalent per week   OB History    Gravida Para Term Preterm AB TAB SAB Ectopic  Multiple Living   1 1 1  0 0 0 0 0 0 1     Review of Systems  Constitutional: Negative for fever.  Eyes: Negative for visual disturbance.  Respiratory: Negative for shortness of breath.   Cardiovascular: Negative for chest pain.  Neurological: Negative for speech difficulty, weakness and numbness.    Allergies  Review of patient's allergies indicates no known allergies.  Home Medications   Prior to Admission medications   Medication Sig Start Date End Date Taking? Authorizing Provider  aspirin EC 81 MG tablet Take 81 mg by mouth daily.    Historical Provider, MD  fenofibrate 160 MG tablet Take 160 mg by mouth daily.    Historical Provider, MD  HYDROcodone-acetaminophen (NORCO/VICODIN) 5-325 MG per tablet Take 1 tablet by mouth as needed. 05/04/14   Historical Provider, MD  mirtazapine (REMERON) 15 MG tablet Take 15 mg by mouth at bedtime.    Historical Provider, MD  propranolol ER (INDERAL LA) 120 MG 24 hr capsule Take 120 mg by mouth daily. 04/08/15   Reynold Bowen, MD  quinapril (ACCUPRIL) 20 MG tablet Take 20 mg by mouth at bedtime.    Historical Provider, MD  Vitamin D, Ergocalciferol, (DRISDOL) 50000 UNITS CAPS capsule Take 1 capsule by mouth once a week. 03/21/15   Reynold Bowen, MD   Triage Vitals: BP 225/83 mmHg  Pulse 80  Temp(Src) 97.9 F (36.6 C) (Oral)  Resp 18  Ht 5\' 2"  (1.575 m)  Wt 95 lb (43.092 kg)  BMI 17.37 kg/m2  SpO2 99%  LMP 11/30/1976 Physical Exam  Constitutional: She is oriented to person, place, and time. She appears well-developed and well-nourished. No distress.  HENT:  Head: Normocephalic and atraumatic.  Nose: Nose normal.  Mouth/Throat: Oropharynx is clear and moist.  Eyes: Conjunctivae and EOM are normal. Pupils are equal, round, and reactive to light.  Neck: Normal range of motion. Neck supple. No JVD present. No tracheal deviation present. No thyromegaly present.  Cardiovascular: Normal rate, regular rhythm, normal heart sounds and intact distal  pulses.  Exam reveals no gallop and no friction rub.   No murmur heard. Pulmonary/Chest: Effort normal and breath sounds normal. No stridor. No respiratory distress. She has no wheezes. She has no rales. She exhibits no tenderness.  Abdominal: Soft. Bowel sounds are normal. She exhibits no distension and no mass. There is no tenderness. There is no rebound and no guarding.  Musculoskeletal: Normal range of motion. She exhibits no edema or tenderness.  Lymphadenopathy:    She has no cervical adenopathy.  Neurological: She is alert and oriented to person, place, and time. She displays normal reflexes. She exhibits normal muscle tone. Coordination normal.  Skin: Skin is warm and dry. No rash noted. No erythema. No pallor.  Psychiatric: She has a normal mood and affect. Her behavior is normal. Judgment and thought content normal.  Nursing note and vitals reviewed.   ED Course  Procedures (including critical care time)  COORDINATION OF CARE: 12:11 AM- Discussed treatment plan with patient at bedside and patient agreed to plan.   Labs Review Labs Reviewed  CBC - Abnormal; Notable for the following:    MCH 25.2 (*)    RDW 15.7 (*)    All other components within normal limits  COMPREHENSIVE METABOLIC PANEL - Abnormal; Notable for the following:    Glucose, Bld 133 (*)    BUN 24 (*)    All other components within normal limits  PROTIME-INR  APTT  DIFFERENTIAL  TROPONIN I  I-STAT TROPOININ, ED  CBG MONITORING, ED  I-STAT CHEM 8, ED    Imaging Review Ct Head Wo Contrast  06/27/2015   CLINICAL DATA:  Stroke-like symptoms. Intermittent numbness to right side. Hypertension.  EXAM: CT HEAD WITHOUT CONTRAST  TECHNIQUE: Contiguous axial images were obtained from the base of the skull through the vertex without intravenous contrast.  COMPARISON:  None.  FINDINGS: Mild generalized cerebral atrophy. No CT findings of acute ischemia or territorial infarct. No intracranial hemorrhage, mass effect,  or midline shift. No hydrocephalus. The basilar cisterns are patent. No intracranial fluid collection. Atherosclerotic calcifications of the skullbase vasculature. Calvarium is intact. Included paranasal sinuses and mastoid air cells are well aerated.  IMPRESSION: Mild generalized atrophy without acute intracranial abnormality.   Electronically Signed   By: Jeb Levering M.D.   On: 06/27/2015 02:04   Mr Brain Wo Contrast  06/27/2015   CLINICAL DATA:  Intermittent facial and extremity numbness. Remote history of renal artery stenosis and hypertension.  EXAM: MRI HEAD WITHOUT CONTRAST  TECHNIQUE: Multiplanar, multiecho pulse sequences of the brain and surrounding structures were obtained without intravenous contrast.  COMPARISON:  CT head June 27, 2015 at 1:48 a.m.  FINDINGS: The ventricles and sulci are normal for patient's age. No suspicious parenchymal signal, mass lesions, mass effect. A few scattered subcentimeter supratentorial white matter T2 hyperintensities compatible chronic small vessel ischemic disease, less than expected  for age. No reduced diffusion to suggest acute ischemia. No susceptibility artifact to suggest hemorrhage.  No abnormal extra-axial fluid collections. No extra-axial masses though, contrast enhanced sequences would be more sensitive. Normal major intracranial vascular flow voids seen at the skull base. Tortuous LEFT vertebral artery mildly deforms the ventral LEFT medulla.  Ocular globes and orbital contents are nonsuspicious though not tailored for evaluation, bilateral ocular lens implants. No abnormal sellar expansion. Visualized paranasal sinuses and mastoid air cells are well-aerated. No suspicious calvarial bone marrow signal. No abnormal sellar expansion. Craniocervical junction maintained. 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  IMPRESSION: No acute intracranial process. Normal noncontrast MRI of the head for age.   Electronically Signed   By: Elon Alas  M.D.   On: 06/27/2015 05:57     EKG Interpretation   Date/Time:  Wednesday June 26 2015 23:51:19 EDT Ventricular Rate:  64 PR Interval:  146 QRS Duration: 84 QT Interval:  435 QTC Calculation: 449 R Axis:   20 Text Interpretation:  Sinus rhythm Atrial premature complex Borderline ST  depression, lateral leads No old tracing to compare Confirmed by Johnita Palleschi   MD, Seward Coran ([id number]) on 06/27/2015 2:29:06 AM      MDM   Final diagnoses:  Hypertensive urgency  Transient cerebral ischemia, unspecified transient cerebral ischemia type   I personally performed the services described in this documentation, which was scribed in my presence. The recorded information has been reviewed and is accurate.  81 year old female with intermittent paresthesias of face, tongue, and hand lasting about 5 minutes intermittently this week.  Patient arrives significantly hypertensive.  She reports that she has been upset recently due to flies in the house and this offense her as she normally keeps a very clean house.  After labetalol, patient's blood pressure has been well controlled.  She arrives a symptomatically has had no recurrence.  Case briefly discussed with Dr. Nicole Kindred on call with neurology.  If MRI negative, can follow-up with primary care Dr. for outpatient carotids.  Suspect hypertensive urgency versus TIA.  Patient sees Dr. Forde Dandy and has good follow-up with him.  Patient is awaiting MRI, if negative, she is stable for discharge home.   Linton Flemings, MD 06/27/15 [id number]  6:29 AM Patient's MRI has returned without acute findings.  Patient's blood pressure, however, has shot back up again.  Concerned given neurologic symptoms with hypertension earlier that she may have another episode of hypertensive urgency/crisis.  Patient to be admitted.    Linton Flemings, MD 06/27/15 [phone]

## 2015-06-27 ENCOUNTER — Inpatient Hospital Stay (HOSPITAL_COMMUNITY): Payer: Medicare Other

## 2015-06-27 ENCOUNTER — Emergency Department (HOSPITAL_COMMUNITY): Payer: Medicare Other

## 2015-06-27 ENCOUNTER — Encounter (HOSPITAL_COMMUNITY): Payer: Self-pay | Admitting: Radiology

## 2015-06-27 DIAGNOSIS — R531 Weakness: Secondary | ICD-10-CM | POA: Diagnosis present

## 2015-06-27 DIAGNOSIS — I129 Hypertensive chronic kidney disease with stage 1 through stage 4 chronic kidney disease, or unspecified chronic kidney disease: Secondary | ICD-10-CM | POA: Diagnosis not present

## 2015-06-27 DIAGNOSIS — R200 Anesthesia of skin: Secondary | ICD-10-CM | POA: Diagnosis not present

## 2015-06-27 DIAGNOSIS — I10 Essential (primary) hypertension: Secondary | ICD-10-CM | POA: Diagnosis not present

## 2015-06-27 DIAGNOSIS — E781 Pure hyperglyceridemia: Secondary | ICD-10-CM | POA: Diagnosis present

## 2015-06-27 DIAGNOSIS — Z90710 Acquired absence of both cervix and uterus: Secondary | ICD-10-CM | POA: Diagnosis not present

## 2015-06-27 DIAGNOSIS — G459 Transient cerebral ischemic attack, unspecified: Secondary | ICD-10-CM | POA: Diagnosis not present

## 2015-06-27 DIAGNOSIS — I160 Hypertensive urgency: Secondary | ICD-10-CM | POA: Insufficient documentation

## 2015-06-27 DIAGNOSIS — I6523 Occlusion and stenosis of bilateral carotid arteries: Secondary | ICD-10-CM | POA: Diagnosis not present

## 2015-06-27 DIAGNOSIS — K219 Gastro-esophageal reflux disease without esophagitis: Secondary | ICD-10-CM | POA: Diagnosis present

## 2015-06-27 DIAGNOSIS — G43909 Migraine, unspecified, not intractable, without status migrainosus: Secondary | ICD-10-CM | POA: Diagnosis present

## 2015-06-27 DIAGNOSIS — Z87891 Personal history of nicotine dependence: Secondary | ICD-10-CM | POA: Diagnosis not present

## 2015-06-27 DIAGNOSIS — M545 Low back pain: Secondary | ICD-10-CM | POA: Diagnosis present

## 2015-06-27 DIAGNOSIS — G451 Carotid artery syndrome (hemispheric): Secondary | ICD-10-CM | POA: Diagnosis not present

## 2015-06-27 DIAGNOSIS — M1990 Unspecified osteoarthritis, unspecified site: Secondary | ICD-10-CM | POA: Diagnosis present

## 2015-06-27 DIAGNOSIS — G450 Vertebro-basilar artery syndrome: Secondary | ICD-10-CM | POA: Diagnosis not present

## 2015-06-27 DIAGNOSIS — I509 Heart failure, unspecified: Secondary | ICD-10-CM | POA: Diagnosis not present

## 2015-06-27 DIAGNOSIS — I77811 Abdominal aortic ectasia: Secondary | ICD-10-CM | POA: Diagnosis not present

## 2015-06-27 DIAGNOSIS — I701 Atherosclerosis of renal artery: Secondary | ICD-10-CM | POA: Diagnosis not present

## 2015-06-27 DIAGNOSIS — G8929 Other chronic pain: Secondary | ICD-10-CM | POA: Diagnosis present

## 2015-06-27 LAB — DIFFERENTIAL
BASOS PCT: 1 % (ref 0–1)
Basophils Absolute: 0.1 10*3/uL (ref 0.0–0.1)
EOS ABS: 0.3 10*3/uL (ref 0.0–0.7)
EOS PCT: 4 % (ref 0–5)
LYMPHS ABS: 2.3 10*3/uL (ref 0.7–4.0)
Lymphocytes Relative: 29 % (ref 12–46)
MONO ABS: 0.6 10*3/uL (ref 0.1–1.0)
MONOS PCT: 8 % (ref 3–12)
Neutro Abs: 4.5 10*3/uL (ref 1.7–7.7)
Neutrophils Relative %: 58 % (ref 43–77)

## 2015-06-27 LAB — COMPREHENSIVE METABOLIC PANEL
ALBUMIN: 4 g/dL (ref 3.5–5.0)
ALT: 14 U/L (ref 14–54)
ANION GAP: 9 (ref 5–15)
AST: 25 U/L (ref 15–41)
Alkaline Phosphatase: 46 U/L (ref 38–126)
BILIRUBIN TOTAL: 0.4 mg/dL (ref 0.3–1.2)
BUN: 24 mg/dL — ABNORMAL HIGH (ref 6–20)
CO2: 24 mmol/L (ref 22–32)
Calcium: 10 mg/dL (ref 8.9–10.3)
Chloride: 106 mmol/L (ref 101–111)
Creatinine, Ser: 0.88 mg/dL (ref 0.44–1.00)
GFR calc Af Amer: >60 mL/min (ref >60)
GFR calc non Af Amer: >60 mL/min (ref >60)
Glucose, Bld: 133 mg/dL — ABNORMAL HIGH (ref 65–99)
Potassium: 4 mmol/L (ref 3.5–5.1)
SODIUM: 139 mmol/L (ref 135–145)
TOTAL PROTEIN: 7 g/dL (ref 6.5–8.1)

## 2015-06-27 LAB — CBC
HCT: 38.7 % (ref 36.0–46.0)
Hemoglobin: 12 g/dL (ref 12.0–15.0)
MCH: 25.2 pg — AB (ref 26.0–34.0)
MCHC: 31 g/dL (ref 30.0–36.0)
MCV: 81.1 fL (ref 78.0–100.0)
PLATELETS: 372 10*3/uL (ref 150–400)
RBC: 4.77 MIL/uL (ref 3.87–5.11)
RDW: 15.7 % — AB (ref 11.5–15.5)
WBC: 7.8 10*3/uL (ref 4.0–10.5)

## 2015-06-27 LAB — I-STAT TROPONIN, ED: TROPONIN I, POC: 0 ng/mL (ref 0.00–0.08)

## 2015-06-27 LAB — PROTIME-INR
INR: 1.03 (ref 0.00–1.49)
PROTHROMBIN TIME: 13.7 s (ref 11.6–15.2)

## 2015-06-27 LAB — TROPONIN I: Troponin I: <0.03 ng/mL (ref <0.031)

## 2015-06-27 LAB — APTT: aPTT: 30 seconds (ref 24–37)

## 2015-06-27 MED ORDER — ONDANSETRON HCL 4 MG/2ML IJ SOLN: 4.0000 mg | Freq: Four times a day (QID) | INTRAMUSCULAR | Status: DC | PRN

## 2015-06-27 MED ORDER — LISINOPRIL 20 MG PO TABS
20.0000 mg | ORAL_TABLET | Freq: Every day | ORAL | Status: DC
  Administered 2015-06-27: 20 mg via ORAL
  Filled 2015-06-27: qty 1

## 2015-06-27 MED ORDER — QUINAPRIL HCL 10 MG PO TABS: 20.0000 mg | ORAL_TABLET | Freq: Every day | ORAL | Status: DC

## 2015-06-27 MED ORDER — MIRTAZAPINE 15 MG PO TABS
15.0000 mg | ORAL_TABLET | Freq: Every day | ORAL | Status: DC
  Administered 2015-06-27: 15 mg via ORAL
  Filled 2015-06-27: qty 1

## 2015-06-27 MED ORDER — ACETAMINOPHEN 325 MG PO TABS: 650.0000 mg | ORAL_TABLET | Freq: Four times a day (QID) | ORAL | Status: DC | PRN

## 2015-06-27 MED ORDER — FENOFIBRATE 160 MG PO TABS
160.0000 mg | ORAL_TABLET | Freq: Every day | ORAL | Status: DC
  Administered 2015-06-27 – 2015-06-28 (×2): 160 mg via ORAL
  Filled 2015-06-27 (×2): qty 1

## 2015-06-27 MED ORDER — ONDANSETRON HCL 4 MG PO TABS: 4.0000 mg | ORAL_TABLET | Freq: Four times a day (QID) | ORAL | Status: DC | PRN

## 2015-06-27 MED ORDER — LABETALOL HCL 5 MG/ML IV SOLN
20.0000 mg | Freq: Once | INTRAVENOUS | Status: AC
  Administered 2015-06-27: 20 mg via INTRAVENOUS
  Filled 2015-06-27: qty 4

## 2015-06-27 MED ORDER — SENNOSIDES-DOCUSATE SODIUM 8.6-50 MG PO TABS: 1.0000 | ORAL_TABLET | Freq: Every evening | ORAL | Status: DC | PRN

## 2015-06-27 MED ORDER — ENOXAPARIN SODIUM 30 MG/0.3ML ~~LOC~~ SOLN
30.0000 mg | SUBCUTANEOUS | Status: DC
  Administered 2015-06-27: 30 mg via SUBCUTANEOUS
  Filled 2015-06-27: qty 0.3

## 2015-06-27 MED ORDER — ALUM & MAG HYDROXIDE-SIMETH 200-200-20 MG/5ML PO SUSP: 30.0000 mL | Freq: Four times a day (QID) | ORAL | Status: DC | PRN

## 2015-06-27 MED ORDER — ACETAMINOPHEN 650 MG RE SUPP: 650.0000 mg | Freq: Four times a day (QID) | RECTAL | Status: DC | PRN

## 2015-06-27 MED ORDER — SODIUM CHLORIDE 0.9 % IV SOLN
INTRAVENOUS | Status: AC
Start: 2015-06-27 — End: 2015-06-28
  Administered 2015-06-27: 18:00:00 via INTRAVENOUS

## 2015-06-27 MED ORDER — ASPIRIN EC 81 MG PO TBEC
81.0000 mg | DELAYED_RELEASE_TABLET | Freq: Every day | ORAL | Status: DC
  Administered 2015-06-27 – 2015-06-28 (×2): 81 mg via ORAL
  Filled 2015-06-27 (×2): qty 1

## 2015-06-27 MED ORDER — PROPRANOLOL HCL ER 120 MG PO CP24
120.0000 mg | ORAL_CAPSULE | Freq: Every day | ORAL | Status: DC
  Administered 2015-06-27 – 2015-06-28 (×2): 120 mg via ORAL
  Filled 2015-06-27 (×2): qty 1

## 2015-06-27 MED ORDER — SODIUM CHLORIDE 0.9 % IJ SOLN
3.0000 mL | Freq: Two times a day (BID) | INTRAMUSCULAR | Status: DC
  Administered 2015-06-28: 3 mL via INTRAVENOUS

## 2015-06-27 MED ORDER — ENOXAPARIN SODIUM 40 MG/0.4ML ~~LOC~~ SOLN: 40.0000 mg | SUBCUTANEOUS | Status: DC

## 2015-06-27 MED ORDER — HYDRALAZINE HCL 25 MG PO TABS
25.0000 mg | ORAL_TABLET | Freq: Three times a day (TID) | ORAL | Status: DC
  Administered 2015-06-27 – 2015-06-28 (×4): 25 mg via ORAL
  Filled 2015-06-27 (×4): qty 1

## 2015-06-27 MED ORDER — IOHEXOL 350 MG/ML SOLN
100.0000 mL | Freq: Once | INTRAVENOUS | Status: AC | PRN
  Administered 2015-06-27: 100 mL via INTRAVENOUS

## 2015-06-27 MED ORDER — PANTOPRAZOLE SODIUM 40 MG PO TBEC
40.0000 mg | DELAYED_RELEASE_TABLET | Freq: Every day | ORAL | Status: DC
  Administered 2015-06-27 – 2015-06-28 (×2): 40 mg via ORAL
  Filled 2015-06-27 (×2): qty 1

## 2015-06-27 NOTE — ED Notes (Signed)
Pt c/o chest tightness and fluttering in chest, MD aware; denies numbness.

## 2015-06-27 NOTE — ED Notes (Signed)
Meal tray ordered per Dr.Otter

## 2015-06-27 NOTE — ED Notes (Signed)
Pt from home c/o intermittent numbness for the past week. At 2100, reports numbness to R side of face and hand that resolved at 52mins. At 2300 pt reports numbness to R leg that has also resolved. Pt noted to be hypertensive. Denies dizziness or headache. No neuro deficits at this time

## 2015-06-27 NOTE — H&P (Signed)
History and Physical  Jessica [name]:[id number] DOB: 1932-04-10 DOA: 06/26/2015  Referring physician: Dr. Sharol Given, ED physician PCP: Sheela Stack, MD   Chief Complaint: Right leg and foot weakness  HPI: Jessica [name] is a 81 y.o. female  With a history of hypertension, history of bilateral renal artery stenosis - status post percutaneous angioplasty approximately 40 years ago, hyperlipidemia. The patient presents to the emergency department following several episodes of numbness in her right first and second digits and periorally. The episodes would last for approximately 5 minutes. Last night the patient had new onset of right leg and foot weakness after getting up to let the dog out. She reports difficulty in ambulating and picking up her foot. On the way to the hospital, her symptoms resolved. She reports no palliating or provoking factors. Upon arrival to the emergency department, her blood pressure was noted to be 225/83. She had IV labetalol, a CT of her head and an MRI of her head which ruled out stroke. She was about to be released from the hospital when her blood pressure spiked again to 213/98.  She reports no symptoms that she had prior.   Review of Systems:   Pt denies any fevers, chills, nausea, vomiting, chest pain, shortness of breath, palpitations, abdominal pain, nausea, vomiting, diarrhea, constipation, melena, rectal bleeding, headache, blurred vision, diplopia.  Review of systems are otherwise negative  Past Medical History  Diagnosis Date  . Hypertension   . Bilateral renal artery stenosis    Past Surgical History  Procedure Laterality Date  . Total vaginal hysterectomy  1973  . Balloon dilation    . Foot surgery     Social History:  reports that she has quit smoking. She has never used smokeless tobacco. She reports that she drinks alcohol. She reports that she does not use illicit drugs. Patient lives at home & is able to participate in activities of daily  living  No Known Allergies  Reports family history of hypertension  Prior to Admission medications   Medication Sig Start Date End Date Taking? Authorizing Provider  aspirin EC 81 MG tablet Take 81 mg by mouth daily.   Yes Historical Provider, MD  fenofibrate 160 MG tablet Take 160 mg by mouth daily.   Yes Historical Provider, MD  mirtazapine (REMERON) 15 MG tablet Take 15 mg by mouth at bedtime.   Yes Historical Provider, MD  omeprazole (PRILOSEC) 20 MG capsule TAKE 1 CAPSULE IN THE EVENING 06/13/15  Yes Historical Provider, MD  propranolol ER (INDERAL LA) 120 MG 24 hr capsule Take 120 mg by mouth daily. 04/08/15  Yes Reynold Bowen, MD  quinapril (ACCUPRIL) 20 MG tablet Take 20 mg by mouth at bedtime.   Yes Historical Provider, MD  Vitamin D, Ergocalciferol, (DRISDOL) 50000 UNITS CAPS capsule Take 1 capsule by mouth once a week. 03/21/15  Yes Reynold Bowen, MD    Physical Exam: BP 176/79 mmHg  Pulse 62  Temp(Src) 98.1 F (36.7 C) (Oral)  Resp 19  Ht 5\' 2"  (1.575 m)  Wt 43.092 kg (95 lb)  BMI 17.37 kg/m2  SpO2 95%  LMP 11/30/1976  General: Elderly Caucasian female who appears her stated age.. Awake and alert and oriented x3. No acute cardiopulmonary distress.  Eyes: Pupils equal, round, reactive to light. Extraocular muscles are intact. Sclerae anicteric and noninjected.  ENT: Moist mucosal membranes. No mucosal lesions. Teeth in moderate repair  Neck: Neck supple without lymphadenopathy. No carotid bruits. No masses palpated.  Cardiovascular: Regular  rate with normal S1-S2 sounds. No murmurs, rubs, gallops auscultated. No JVD.  Respiratory: Good respiratory effort with no wheezes, rales, rhonchi. Lungs clear to auscultation bilaterally.  Abdomen: Soft, nontender, nondistended. Active bowel sounds. No abdominal bruits auscultated. No masses or hepatosplenomegaly  Skin: Dry, warm to touch. 2+ dorsalis pedis and radial pulses. Musculoskeletal: No calf or leg pain. All major joints not  erythematous nontender.  Psychiatric: Intact judgment and insight.  Neurologic: No focal neurological deficits. Cranial nerves II through XII are grossly intact.           Labs on Admission:  Basic Metabolic Panel:  Recent Labs Lab 06/27/15 0025  NA 139  K 4.0  CL 106  CO2 24  GLUCOSE 133*  BUN 24*  CREATININE 0.88  CALCIUM 10.0   Liver Function Tests:  Recent Labs Lab 06/27/15 0025  AST 25  ALT 14  ALKPHOS 46  BILITOT 0.4  PROT 7.0  ALBUMIN 4.0   No results for input(s): LIPASE, AMYLASE in the last 168 hours. No results for input(s): AMMONIA in the last 168 hours. CBC:  Recent Labs Lab 06/27/15 0025  WBC 7.8  NEUTROABS 4.5  HGB 12.0  HCT 38.7  MCV 81.1  PLT 372   Cardiac Enzymes:  Recent Labs Lab 06/27/15 0025  TROPONINI <0.03    BNP (last 3 results) No results for input(s): BNP in the last 8760 hours.  ProBNP (last 3 results) No results for input(s): PROBNP in the last 8760 hours.  CBG: No results for input(s): GLUCAP in the last 168 hours.  Radiological Exams on Admission: Ct Head Wo Contrast  06/27/2015   CLINICAL DATA:  Stroke-like symptoms. Intermittent numbness to right side. Hypertension.  EXAM: CT HEAD WITHOUT CONTRAST  TECHNIQUE: Contiguous axial images were obtained from the base of the skull through the vertex without intravenous contrast.  COMPARISON:  None.  FINDINGS: Mild generalized cerebral atrophy. No CT findings of acute ischemia or territorial infarct. No intracranial hemorrhage, mass effect, or midline shift. No hydrocephalus. The basilar cisterns are patent. No intracranial fluid collection. Atherosclerotic calcifications of the skullbase vasculature. Calvarium is intact. Included paranasal sinuses and mastoid air cells are well aerated.  IMPRESSION: Mild generalized atrophy without acute intracranial abnormality.   Electronically Signed   By: Jeb Levering M.D.   On: 06/27/2015 02:04   Mr Brain Wo Contrast  06/27/2015    CLINICAL DATA:  Intermittent facial and extremity numbness. Remote history of renal artery stenosis and hypertension.  EXAM: MRI HEAD WITHOUT CONTRAST  TECHNIQUE: Multiplanar, multiecho pulse sequences of the brain and surrounding structures were obtained without intravenous contrast.  COMPARISON:  CT head June 27, 2015 at 1:48 a.m.  FINDINGS: The ventricles and sulci are normal for patient's age. No suspicious parenchymal signal, mass lesions, mass effect. A few scattered subcentimeter supratentorial white matter T2 hyperintensities compatible chronic small vessel ischemic disease, less than expected for age. No reduced diffusion to suggest acute ischemia. No susceptibility artifact to suggest hemorrhage.  No abnormal extra-axial fluid collections. No extra-axial masses though, contrast enhanced sequences would be more sensitive. Normal major intracranial vascular flow voids seen at the skull base. Tortuous LEFT vertebral artery mildly deforms the ventral LEFT medulla.  Ocular globes and orbital contents are nonsuspicious though not tailored for evaluation, bilateral ocular lens implants. No abnormal sellar expansion. Visualized paranasal sinuses and mastoid air cells are well-aerated. No suspicious calvarial bone marrow signal. No abnormal sellar expansion. Craniocervical junction maintained. 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  IMPRESSION: No acute intracranial process. Normal noncontrast MRI of the head for age.   Electronically Signed   By: Elon Alas M.D.   On: 06/27/2015 05:57    EKG: Independently reviewed. Normal sinus rhythm. Normal intervals. No acute ST changes. Negative for STEMI  Assessment/Plan Present on Admission:  . Hypertensive urgency  This patient was discussed with the ED physician, including pertinent vitals, physical exam findings, labs, and imaging.  We also discussed care given by the ED provider.  #1 hypertension urgency #2 history of renal artery  stenosis  Admit to telemetry  Patient has good blood pressure response to IV labetalol - current blood pressure is 973 systolically. At this point, I do not see the need to adjust her medications.  We'll give her her oral medications and see how her blood pressure maintains over the course of the day.  As she has a history of renal artery stenosis, will obtain a CT angiogram of the abdomen and pelvis to rule out recurrence  Check metabolic panel in the morning  DVT prophylaxis: Lovenox  Consultants: None  Code Status: Full code  Family Communication: Brother in the room   Disposition Plan: Home following with improved blood pressure control   Truett Mainland, DO [facility] Pager [phone]

## 2015-06-27 NOTE — ED Notes (Signed)
Pt transported to MRI 

## 2015-06-27 NOTE — Progress Notes (Signed)
Report received from [place], [place] for admission to [phone]

## 2015-06-27 NOTE — ED Notes (Signed)
MD at bedside. 

## 2015-06-27 NOTE — Progress Notes (Signed)
AUSTRALIA DROLL is a 81 y.o. female patient admitted from ED awake, alert - oriented  X 3 - no acute distress noted.  VSS - Blood pressure 170/54, pulse 71, temperature 98.2 F (36.8 C), temperature source Oral, resp. rate 20, height 5\' 2"  (1.575 m), weight 98.4 kg (216 lb 14.9 oz), last menstrual period 11/30/1976, SpO2 100 %.    IV in place, occlusive dsg intact without redness.  Orientation to room, and floor completed with information packet given to patient/family.  Patient declined safety video at this time.  Admission INP armband ID verified with patient/family, and in place.   SR up x 2, fall assessment complete, with patient and family able to verbalize understanding of risk associated with falls, and verbalized understanding to call nsg before up out of bed.  Call light within reach, patient able to voice, and demonstrate understanding.  Skin, clean-dry- intact without evidence of bruising, or skin tears.   No evidence of skin break down noted on exam.     Will cont to eval and treat per MD orders.  Janalyn Shy, RN 06/27/2015 5:49 PM

## 2015-06-27 NOTE — Progress Notes (Signed)
Utilization review completed.  

## 2015-06-27 NOTE — ED Notes (Signed)
Placed order for meal tray.

## 2015-06-28 ENCOUNTER — Inpatient Hospital Stay (HOSPITAL_COMMUNITY): Payer: Medicare Other

## 2015-06-28 DIAGNOSIS — E785 Hyperlipidemia, unspecified: Secondary | ICD-10-CM

## 2015-06-28 DIAGNOSIS — G451 Carotid artery syndrome (hemispheric): Secondary | ICD-10-CM

## 2015-06-28 DIAGNOSIS — I10 Essential (primary) hypertension: Secondary | ICD-10-CM

## 2015-06-28 DIAGNOSIS — I509 Heart failure, unspecified: Secondary | ICD-10-CM

## 2015-06-28 DIAGNOSIS — G459 Transient cerebral ischemic attack, unspecified: Secondary | ICD-10-CM | POA: Diagnosis present

## 2015-06-28 DIAGNOSIS — I701 Atherosclerosis of renal artery: Secondary | ICD-10-CM

## 2015-06-28 DIAGNOSIS — G450 Vertebro-basilar artery syndrome: Secondary | ICD-10-CM

## 2015-06-28 DIAGNOSIS — I6523 Occlusion and stenosis of bilateral carotid arteries: Secondary | ICD-10-CM

## 2015-06-28 LAB — LIPID PANEL
CHOL/HDL RATIO: 3.1 ratio
Cholesterol: 148 mg/dL (ref 0–200)
HDL: 48 mg/dL (ref >40)
LDL CALC: 62 mg/dL (ref 0–99)
Triglycerides: 188 mg/dL — ABNORMAL HIGH (ref <150)
VLDL: 38 mg/dL (ref 0–40)

## 2015-06-28 LAB — BASIC METABOLIC PANEL
ANION GAP: 6 (ref 5–15)
BUN: 15 mg/dL (ref 6–20)
CALCIUM: 8.8 mg/dL — AB (ref 8.9–10.3)
CO2: 21 mmol/L — AB (ref 22–32)
Chloride: 113 mmol/L — ABNORMAL HIGH (ref 101–111)
Creatinine, Ser: 0.81 mg/dL (ref 0.44–1.00)
Glucose, Bld: 95 mg/dL (ref 65–99)
Potassium: 3.7 mmol/L (ref 3.5–5.1)
Sodium: 140 mmol/L (ref 135–145)

## 2015-06-28 LAB — CBC
HEMATOCRIT: 35.7 % — AB (ref 36.0–46.0)
Hemoglobin: 11.1 g/dL — ABNORMAL LOW (ref 12.0–15.0)
MCH: 25.2 pg — ABNORMAL LOW (ref 26.0–34.0)
MCHC: 31.1 g/dL (ref 30.0–36.0)
MCV: 81 fL (ref 78.0–100.0)
Platelets: 284 10*3/uL (ref 150–400)
RBC: 4.41 MIL/uL (ref 3.87–5.11)
RDW: 15.7 % — ABNORMAL HIGH (ref 11.5–15.5)
WBC: 7.7 10*3/uL (ref 4.0–10.5)

## 2015-06-28 MED ORDER — SODIUM CHLORIDE 0.9 % IV SOLN: INTRAVENOUS | Status: DC

## 2015-06-28 MED ORDER — IOHEXOL 350 MG/ML SOLN
100.0000 mL | Freq: Once | INTRAVENOUS | Status: AC | PRN
Start: 2015-06-28 — End: 2015-06-28
  Administered 2015-06-28: 80 mL via INTRAVENOUS

## 2015-06-28 MED ORDER — AMLODIPINE BESYLATE 10 MG PO TABS: 10.0000 mg | ORAL_TABLET | Freq: Every day | ORAL | Status: DC

## 2015-06-28 MED ORDER — CLOPIDOGREL BISULFATE 75 MG PO TABS: 75.0000 mg | ORAL_TABLET | Freq: Every day | ORAL | Status: DC

## 2015-06-28 NOTE — Progress Notes (Signed)
VASCULAR LAB PRELIMINARY  PRELIMINARY  PRELIMINARY  PRELIMINARY  Carotid duplex completed.    Preliminary report:  Bilateral:  1-39% ICA stenosis.  Vertebral artery flow is antegrade.     Zema Lizardo, RVS 06/28/2015, 1:47 PM

## 2015-06-28 NOTE — Progress Notes (Signed)
PT Cancellation Note  Patient Details Name: CHAKA BOYSON MRN: [id number] DOB: 04/21/1932   Cancelled Treatment:    Reason Eval/Treat Not Completed: Patient at procedure or test/unavailable Pt off floor at Vascular lab. Will follow up next available time to perform PT evaluation.   Marguarite Arbour A Landynn Dupler 06/28/2015, 1:32 PM  Wray Kearns, [place], DPT [phone]

## 2015-06-28 NOTE — Progress Notes (Signed)
Patient Demographics:    Jessica [name], is a 81 y.o. female, DOB - 03-22-1932, ZOX:[id number]  Admit date - 06/26/2015   Admitting Physician Etta Quill, DO  Outpatient Primary MD for the patient is Sheela Stack, MD  LOS - 1   Chief Complaint  Patient presents with  . Numbness        Subjective:    Jessica [name] today has, No headache, No chest pain, No abdominal pain - No Nausea, No new weakness tingling or numbness, No Cough - SOB.   Assessment  & Plan :    Active Problems:   Hypertensive urgency   1. Right-sided tingling numbness. Suspicious for TIA., on tele, obtain PT, OT, speech input that patient has refused saying PTOT her speech, lipid panel stable, MRI brain stable, pending A1c, echogram, carotid duplex, neurology following. Currently placed on Plavix from aspirin.   No results found for: HGBA1C  Lab Results  Component Value Date   CHOL 148 06/28/2015   HDL 48 06/28/2015   LDLCALC 62 06/28/2015   TRIG 188* 06/28/2015   CHOLHDL 3.1 06/28/2015   2. Essential hypertension. Her pressure was high upon admission likely due to #1 above. Currently blood pressure is stable. Continue on current regimen which includes beta blocker and hydralazine.   3. Hypertriglyceridemia. Continue fibrate at home dose.  4. Smoking. Counseled to quit  5. Running bilateral renal artery stenosis, right-sided subclavian stenosis, aortoiliac plaque. Continue Plavix and fib rate for second-degree prevention. Vascular surgery consulted.     Code Status : Full  Family Communication  : Brother bedside   Disposition Plan  : Home likely in the morning  Consults  :  Neurology, vascular surgery  Procedures  :  CT and MRI head nonacute.  CT chest showing a right-sided subclavian stenosis,  aortoiliac plaque.  CT abdomen and pelvis showing some bilateral renal artery disease.  DVT Prophylaxis  :  Lovenox   Lab Results  Component Value Date   PLT 284 06/28/2015    Inpatient Medications  Scheduled Meds: . aspirin EC  81 mg Oral Daily  . enoxaparin (LOVENOX) injection  30 mg Subcutaneous Q24H  . fenofibrate  160 mg Oral Daily  . hydrALAZINE  25 mg Oral 3 times per day  . mirtazapine  15 mg Oral QHS  . pantoprazole  40 mg Oral Daily  . propranolol ER  120 mg Oral Daily  . sodium chloride  3 mL Intravenous Q12H   Continuous Infusions: . sodium chloride     PRN Meds:.acetaminophen **OR** acetaminophen, alum & mag hydroxide-simeth, ondansetron **OR** ondansetron (ZOFRAN) IV, senna-docusate  Antibiotics  :    Anti-infectives    None        Objective:   Filed Vitals:   06/28/15 1038 06/28/15 1043 06/28/15 1134 06/28/15 1136  BP: 122/50 122/50 166/65 158/52  Pulse: 68 63 63 65  Temp:      TempSrc:      Resp:   16   Height:      Weight:      SpO2:   98% 99%    Wt Readings from Last 3 Encounters:  06/28/15 42.5 kg (93 lb 11.1 oz)  05/14/15 42.638 kg (94 lb)  09/04/14 42.638 kg (  94 lb)     Intake/Output Summary (Last 24 hours) at 06/28/15 1308 Last data filed at 06/28/15 0933  Gross per 24 hour  Intake 1372.5 ml  Output    550 ml  Net  822.5 ml     Physical Exam  Awake Alert, Oriented X 3, No new F.N deficits, Normal affect Jessica [name].AT,PERRAL Supple Neck,No JVD, No cervical lymphadenopathy appriciated.  Symmetrical Chest wall movement, Good air movement bilaterally, CTAB RRR,No Gallops,Rubs or new Murmurs, No Parasternal Heave +ve B.Sounds, Abd Soft, No tenderness, No organomegaly appriciated, No rebound - guarding or rigidity. No Cyanosis, Clubbing or edema, No new Rash or bruise      Data Review:   Micro Results No results found for this or any previous visit (from the past 240 hour(s)).  Radiology Reports Ct Head Wo Contrast  06/27/2015    CLINICAL DATA:  Stroke-like symptoms. Intermittent numbness to right side. Hypertension.  EXAM: CT HEAD WITHOUT CONTRAST  TECHNIQUE: Contiguous axial images were obtained from the base of the skull through the vertex without intravenous contrast.  COMPARISON:  None.  FINDINGS: Mild generalized cerebral atrophy. No CT findings of acute ischemia or territorial infarct. No intracranial hemorrhage, mass effect, or midline shift. No hydrocephalus. The basilar cisterns are patent. No intracranial fluid collection. Atherosclerotic calcifications of the skullbase vasculature. Calvarium is intact. Included paranasal sinuses and mastoid air cells are well aerated.  IMPRESSION: Mild generalized atrophy without acute intracranial abnormality.   Electronically Signed   By: Jeb Levering M.D.   On: 06/27/2015 02:04   Mr Brain Wo Contrast  06/27/2015   CLINICAL DATA:  Intermittent facial and extremity numbness. Remote history of renal artery stenosis and hypertension.  EXAM: MRI HEAD WITHOUT CONTRAST  TECHNIQUE: Multiplanar, multiecho pulse sequences of the brain and surrounding structures were obtained without intravenous contrast.  COMPARISON:  CT head June 27, 2015 at 1:48 a.m.  FINDINGS: The ventricles and sulci are normal for patient's age. No suspicious parenchymal signal, mass lesions, mass effect. A few scattered subcentimeter supratentorial white matter T2 hyperintensities compatible chronic small vessel ischemic disease, less than expected for age. No reduced diffusion to suggest acute ischemia. No susceptibility artifact to suggest hemorrhage.  No abnormal extra-axial fluid collections. No extra-axial masses though, contrast enhanced sequences would be more sensitive. Normal major intracranial vascular flow voids seen at the skull base. Tortuous LEFT vertebral artery mildly deforms the ventral LEFT medulla.  Ocular globes and orbital contents are nonsuspicious though not tailored for evaluation, bilateral ocular  lens implants. No abnormal sellar expansion. Visualized paranasal sinuses and mastoid air cells are well-aerated. No suspicious calvarial bone marrow signal. No abnormal sellar expansion. Craniocervical junction maintained. 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  IMPRESSION: No acute intracranial process. Normal noncontrast MRI of the head for age.   Electronically Signed   By: Elon Alas M.D.   On: 06/27/2015 05:57   Ct Angio Abd/pel W/ And/or W/o  06/28/2015   CLINICAL DATA:  renal artery stenosis, post angioplasty. Currently asymptomatic.  EXAM: CT ANGIOGRAPHY ABDOMEN  TECHNIQUE: Multidetector CT imaging of the abdomen was performed using the standard protocol during bolus administration of intravenous contrast. Multiplanar reconstructed images including MIPs were obtained and reviewed to evaluate the vascular anatomy.  CONTRAST:  58mL OMNIPAQUE IOHEXOL 350 MG/ML SOLN  COMPARISON:  COMPARISON None available  FINDINGS: ARTERIAL FINDINGS:  Aorta: Calcified plaque throughout. Fusiform ectasia of the infrarenal segment up to 2 cm, with some eccentric mural thrombus. No dissection, stenosis, or overt aneurysm.  Celiac axis: Calcified ostial plaque resulting in only mild short segment stenosis, patent distally  Superior mesenteric: Eccentric calcified nonocclusive ostial plaque. Replaced right hepatic arterial supply, an anatomic variant.  Left renal: Single, with mild mild nonocclusive partially calcified ostial plaque as well as eccentric plaque more distally over a length of approximately 1 cm, just proximal to the bifurcation of the main renal artery, without definite high-grade stenosis.  Right renal: Single, widely patent, with some scattered plaque distally.  Inferior mesenteric:  Patent  Left iliac:           Visualized proximal common iliac unremarkable.  Right iliac: Extensive calcified plaque through the length of the common iliac without high-grade stenosis.  Venous findings: Patent  hepatic veins, portal vein, SMV, IMV, splenic vein, bilateral renal veins, and visualized segments of IVC.  Review of the MIP images confirms the above findings.  Nonvascular findings: Minimal dependent atelectasis in the visualized lung bases. Innumerable hepatic and small bilateral renal cysts with a dominant inferior cyst from the lower pole left kidney measuring 3.6 cm. No hydronephrosis. Unremarkable spleen, adrenal glands, pancreas. Stomach and visualized segments of small bowel and colon are nondilated. No free air. No ascites. No adenopathy. Mild spondylitic changes in the lower lumbar spine.  IMPRESSION: 1. Bilateral renal arterial plaque, left greater than right, without evidence of high-grade stenosis. 2. Extensive aortoiliac arterial plaque. 3. Additional  nonacute ancillary findings as above.   Electronically Signed   By: Lucrezia Europe M.D.   On: 06/28/2015 10:11   Ct Angio Abd/pel W/ And/or W/o  06/27/2015   CLINICAL DATA:  Hypertensive crisis. Evaluate for pulmonary embolism.  EXAM: CT ANGIOGRAPHY CHEST WITH CONTRAST  TECHNIQUE: Multidetector CT imaging of the chest was performed using the standard protocol during bolus administration of intravenous contrast. Multiplanar CT image reconstructions and MIPs were obtained to evaluate the vascular anatomy.  CONTRAST:  152mL OMNIPAQUE IOHEXOL 350 MG/ML SOLN  COMPARISON:  None.  FINDINGS: Vascular Findings:  There is adequate opacification of the pulmonary arterial system with the main pulmonary artery measuring 687 Hounsfield units. No discrete filling defects are seen within the pulmonary arterial tree to suggest pulmonary embolism. Normal caliber the main pulmonary artery.  Normal heart size. No pericardial effusion. Scattered coronary artery calcifications.  Scattered minimal amount of mixed calcified and noncalcified atherosclerotic plaque with a normal caliber thoracic aorta, not resulting in a hemodynamically significant stenosis. No definite thoracic  aortic dissection or periaortic stranding on this nongated examination.  There is eccentric calcified plaque involving the origin right subclavian artery (representative axial image 26, series 501 all coronal image 61, series 504) potentially resulting in 8 hemodynamically significant narrowing at this location. There is a minimal amount of eccentric mixed calcified and noncalcified atherosclerotic plaque involving the origin of the left subclavian artery, not definitely resulting in a hemodynamically significant stenosis. The branch vessels of the aortic arch are widely patent throughout their imaged course.  Review of the MIP images confirms the above findings.   ----------------------------------------------------------------------------------  Nonvascular Findings:  Moderate to severe apical predominant centrilobular emphysematous change.  Minimal dependent subpleural ground-glass atelectasis. Minimal grossly symmetric biapical pleural parenchymal thickening. No focal airspace opacities. No pleural effusion or pneumothorax. The central pulmonary airways are widely patent.  No discrete pulmonary nodules. No mediastinal, hilar or axillary lymphadenopathy.  Limited early arterial phase evaluation of the upper abdomen demonstrates a slightly ill-defined approximately 1.3 cm hypo attenuating (5 Hounsfield unit) cyst within in the dome of the left lobe  of the liver (image 95, series 501). Additional scattered sub cm hypoattenuating hepatic lesions are too small to adequately characterize of favored to represent additional hepatic cysts. There is mild thickening of the medial limb of the left adrenal gland without discrete nodule.  No acute or aggressive osseous abnormalities.  Regional soft tissues appear normal. Normal appearance of the thyroid gland.  IMPRESSION: 1. No acute cardiopulmonary disease. Specifically, no evidence of pulmonary embolism. 2. Moderate to severe emphysematous change. 3. Minimal coronary  artery calcifications. 4. Scattered atherosclerotic plaque within a normal caliber thoracic aorta. 5. Potential hemodynamically significant narrowing involving the origin of the right subclavian artery. Correlation for symptoms of right subclavian steal syndrome is recommended. Further evaluation could be performed with the acquisition of bilateral upper extremity blood pressures as well as a carotid Doppler ultrasound (to evaluate for retrograde flow within the ipsilateral right vertebral artery) as indicated.   Electronically Signed   By: Sandi Mariscal M.D.   On: 06/27/2015 15:43     CBC  Recent Labs Lab 06/27/15 0025 06/28/15 0610  WBC 7.8 7.7  HGB 12.0 11.1*  HCT 38.7 35.7*  PLT 372 284  MCV 81.1 81.0  MCH 25.2* 25.2*  MCHC 31.0 31.1  RDW 15.7* 15.7*  LYMPHSABS 2.3  --   MONOABS 0.6  --   EOSABS 0.3  --   BASOSABS 0.1  --     Chemistries   Recent Labs Lab 06/27/15 0025 06/28/15 0610  NA 139 140  K 4.0 3.7  CL 106 113*  CO2 24 21*  GLUCOSE 133* 95  BUN 24* 15  CREATININE 0.88 0.81  CALCIUM 10.0 8.8*  AST 25  --   ALT 14  --   ALKPHOS 46  --   BILITOT 0.4  --    ------------------------------------------------------------------------------------------------------------------ estimated creatinine clearance is 37.2 mL/min (by C-G formula based on Cr of 0.81). ------------------------------------------------------------------------------------------------------------------ No results for input(s): HGBA1C in the last 72 hours. ------------------------------------------------------------------------------------------------------------------  Recent Labs  06/28/15 0610  CHOL 148  HDL 48  LDLCALC 62  TRIG 188*  CHOLHDL 3.1   ------------------------------------------------------------------------------------------------------------------ No results for input(s): TSH, T4TOTAL, T3FREE, THYROIDAB in the last 72 hours.  Invalid input(s):  FREET3 ------------------------------------------------------------------------------------------------------------------ No results for input(s): VITAMINB12, FOLATE, FERRITIN, TIBC, IRON, RETICCTPCT in the last 72 hours.  Coagulation profile  Recent Labs Lab 06/27/15 0025  INR 1.03    No results for input(s): DDIMER in the last 72 hours.  Cardiac Enzymes  Recent Labs Lab 06/27/15 0025  TROPONINI <0.03   ------------------------------------------------------------------------------------------------------------------ Invalid input(s): POCBNP   Time Spent in minutes   30   SINGH,PRASHANT K M.D on 06/28/2015 at 1:08 PM  Between 7am to 7pm - Pager - [phone]  After 7pm go to [email or web address] - password [email or web address]  [facility] -  Office  [phone]

## 2015-06-28 NOTE — Discharge Summary (Signed)
Jessica [name], is a 81 y.o. female  DOB 1932/08/03  MRN [id number].  Admission date:  06/26/2015  Admitting Physician  Etta Quill, DO  Discharge Date:  06/28/2015   Primary MD  Sheela Stack, MD  Recommendations for primary care physician for things to follow:   Monitor CBC, CMP, blood pressure, A1c next visit  One-time outpatient neurology and vascular surgery follow-up recommended   Admission Diagnosis  Hypertensive crisis [I10] Hypertensive urgency [I10] Transient cerebral ischemia, unspecified transient cerebral ischemia type [G45.9]   Discharge Diagnosis  Hypertensive crisis [I10] Hypertensive urgency [I10] Transient cerebral ischemia, unspecified transient cerebral ischemia type [G45.9]     Active Problems:   Essential hypertension, benign   TIA (transient ischemic attack)   Dyslipidemia   Renal artery stenosis      Past Medical History  Diagnosis Date  . Hypertension   . Bilateral renal artery stenosis   . Hypertriglyceridemia   . Anemia   . GERD (gastroesophageal reflux disease)   . Headache     "probably weekly" (06/27/2015)  . Migraine     "maybe monthly" (06/27/2015)  . Arthritis     "thumbs" (06/27/2015)  . Chronic lower back pain     Past Surgical History  Procedure Laterality Date  . Balloon dilation  1980's?    "for renal stenosis"  . Toe surgery Bilateral     "straightened big toe"  . Vaginal hysterectomy  1973  . Tonsillectomy  ~ 1941       HPI  from the history and physical done on the day of admission:    Jessica [name] is a 81 y.o. female With a history of hypertension, history of bilateral renal artery stenosis - status post percutaneous angioplasty approximately 40 years ago, hyperlipidemia. The patient presents to the emergency department following several  episodes of numbness in her right first and second digits and periorally. The episodes would last for approximately 5 minutes. Last night the patient had new onset of right leg and foot weakness after getting up to let the dog out. She reports difficulty in ambulating and picking up her foot. On the way to the hospital, her symptoms resolved. She reports no palliating or provoking factors. Upon arrival to the emergency department, her blood pressure was noted to be 225/83. She had IV labetalol, a CT of her head and an MRI of her head which ruled out stroke. She was about to be released from the hospital when her blood pressure spiked again to 213/98. She reports no symptoms that she had prior.     Hospital Course:     1. Right-sided tingling numbness. Suspicious for TIA., on tele, patient has refused PTOT and speech therapy evaluation, lipid panel stable, MRI brain stable, pending A1c, stable echogram, stable carotid duplex, was seen by neurology. Currently placed on Plavix from aspirin. We'll request one-time outpatient neurology follow-up postdischarge. All her symptoms have resolved and she wants to be discharged right away.   2. Essential hypertension. Blood pressure was somewhat  high this morning as well, she is on ACE inhibitor which she will continue I will add Norvasc for better control.   3. Hypertriglyceridemia. Continue fibrate at home dose. LDL was less than 70.   4. Smoking. Counseled to quit   5. Running bilateral renal artery stenosis, right-sided subclavian stenosis, aortoiliac plaque. Continue Plavix and fibrate for secondary prevention. Discussed her case with vascular surgeon Dr. Adele Barthel for her subclavian stenosis since narrowing is under 50% and no intervention is needed, she can follow with him one time in the office. He attempted to see the patient but she was out for testing at that time.   Discharge Condition: Stable  Follow UP  Follow-up Information    Follow  up with Sheela Stack, MD. Schedule an appointment as soon as possible for a visit in 1 week.   Specialty:  Endocrinology   Contact information:   [address] [place] [address] [phone]       Follow up with Adele Barthel, MD. Schedule an appointment as soon as possible for a visit in 1 week.   Specialties:  Vascular Surgery, Cardiology   Contact information:   [address] [place] [address] [phone]       Follow up with [place]. Schedule an appointment as soon as possible for a visit in 2 weeks.   Why:  TIA   Contact information:   [address] [place] [address] [phone]       Consults obtained - Vascular, Neuro  Diet and Activity recommendation: See Discharge Instructions below  Discharge Instructions       Discharge Instructions    Diet - low sodium heart healthy    Complete by:  As directed      Discharge instructions    Complete by:  As directed   Follow with Primary MD Sheela Stack, MD in 7 days   Get CBC, CMP, A1c, 2 view Chest X ray checked  by Primary MD next visit.    Activity: As tolerated with Full fall precautions use walker/cane & assistance as needed   Disposition Home     Diet: Heart Healthy    For Heart failure patients - Check your Weight same time everyday, if you gain over 2 pounds, or you develop in leg swelling, experience more shortness of breath or chest pain, call your Primary MD immediately. Follow Cardiac Low Salt Diet and 1.5 lit/day fluid restriction.   On your next visit with your primary care physician please Get Medicines reviewed and adjusted.   Please request your Prim.MD to go over all Hospital Tests and Procedure/Radiological results at the follow up, please get all Hospital records sent to your Prim MD by signing hospital release before you go home.   If you experience worsening of your admission symptoms, develop shortness of breath, life  threatening emergency, suicidal or homicidal thoughts you must seek medical attention immediately by calling 911 or calling your MD immediately  if symptoms less severe.  You Must read complete instructions/literature along with all the possible adverse reactions/side effects for all the Medicines you take and that have been prescribed to you. Take any new Medicines after you have completely understood and accpet all the possible adverse reactions/side effects.   Do not drive, operating heavy machinery, perform activities at heights, swimming or participation in water activities or provide baby sitting services if your were admitted for syncope or siezures until you have seen by Primary MD or  a Neurologist and advised to do so again.  Do not drive when taking Pain medications.    Do not take more than prescribed Pain, Sleep and Anxiety Medications  Special Instructions: If you have smoked or chewed Tobacco  in the last 2 yrs please stop smoking, stop any regular Alcohol  and or any Recreational drug use.  Wear Seat belts while driving.   Please note  You were cared for by a hospitalist during your hospital stay. If you have any questions about your discharge medications or the care you received while you were in the hospital after you are discharged, you can call the unit and asked to speak with the hospitalist on call if the hospitalist that took care of you is not available. Once you are discharged, your primary care physician will handle any further medical issues. Please note that NO REFILLS for any discharge medications will be authorized once you are discharged, as it is imperative that you return to your primary care physician (or establish a relationship with a primary care physician if you do not have one) for your aftercare needs so that they can reassess your need for medications and monitor your lab values.     Increase activity slowly    Complete by:  As directed               Discharge Medications       Medication List    STOP taking these medications        aspirin EC 81 MG tablet      TAKE these medications        amLODipine 10 MG tablet  Commonly known as:  NORVASC  Take 1 tablet (10 mg total) by mouth daily.     clopidogrel 75 MG tablet  Commonly known as:  PLAVIX  Take 1 tablet (75 mg total) by mouth daily.     fenofibrate 160 MG tablet  Take 160 mg by mouth daily.     mirtazapine 15 MG tablet  Commonly known as:  REMERON  Take 15 mg by mouth at bedtime.     omeprazole 20 MG capsule  Commonly known as:  PRILOSEC  TAKE 1 CAPSULE IN THE EVENING     propranolol ER 120 MG 24 hr capsule  Commonly known as:  INDERAL LA  Take 120 mg by mouth daily.     quinapril 20 MG tablet  Commonly known as:  ACCUPRIL  Take 20 mg by mouth at bedtime.     Vitamin D (Ergocalciferol) 50000 UNITS Caps capsule  Commonly known as:  DRISDOL  Take 1 capsule by mouth once a week.        Major procedures and Radiology Reports - PLEASE review detailed and final reports for all details, in brief -   TTE  - Left ventricle: The cavity size was normal. Wall thickness was increased in a pattern of moderate LVH. Systolic function was vigorous. The estimated ejection fraction was in the range of 65% to 70%. Wall motion was normal; there were no regional wall motion abnormalities. Doppler parameters are consistent with abnormal left ventricular relaxation (grade 1 diastolic dysfunction). Doppler parameters are consistent with high ventricular filling pressure.   Ct Head Wo Contrast  06/27/2015   CLINICAL DATA:  Stroke-like symptoms. Intermittent numbness to right side. Hypertension.  EXAM: CT HEAD WITHOUT CONTRAST  TECHNIQUE: Contiguous axial images were obtained from the base of the skull through the vertex without intravenous contrast.  COMPARISON:  None.  FINDINGS: Mild generalized cerebral atrophy. No CT findings of acute ischemia or  territorial infarct. No intracranial hemorrhage, mass effect, or midline shift. No hydrocephalus. The basilar cisterns are patent. No intracranial fluid collection. Atherosclerotic calcifications of the skullbase vasculature. Calvarium is intact. Included paranasal sinuses and mastoid air cells are well aerated.  IMPRESSION: Mild generalized atrophy without acute intracranial abnormality.   Electronically Signed   By: Jeb Levering M.D.   On: 06/27/2015 02:04   Mr Brain Wo Contrast  06/27/2015   CLINICAL DATA:  Intermittent facial and extremity numbness. Remote history of renal artery stenosis and hypertension.  EXAM: MRI HEAD WITHOUT CONTRAST  TECHNIQUE: Multiplanar, multiecho pulse sequences of the brain and surrounding structures were obtained without intravenous contrast.  COMPARISON:  CT head June 27, 2015 at 1:48 a.m.  FINDINGS: The ventricles and sulci are normal for patient's age. No suspicious parenchymal signal, mass lesions, mass effect. A few scattered subcentimeter supratentorial white matter T2 hyperintensities compatible chronic small vessel ischemic disease, less than expected for age. No reduced diffusion to suggest acute ischemia. No susceptibility artifact to suggest hemorrhage.  No abnormal extra-axial fluid collections. No extra-axial masses though, contrast enhanced sequences would be more sensitive. Normal major intracranial vascular flow voids seen at the skull base. Tortuous LEFT vertebral artery mildly deforms the ventral LEFT medulla.  Ocular globes and orbital contents are nonsuspicious though not tailored for evaluation, bilateral ocular lens implants. No abnormal sellar expansion. Visualized paranasal sinuses and mastoid air cells are well-aerated. No suspicious calvarial bone marrow signal. No abnormal sellar expansion. Craniocervical junction maintained. 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  IMPRESSION: No acute intracranial process. Normal noncontrast MRI of the  head for age.   Electronically Signed   By: Elon Alas M.D.   On: 06/27/2015 05:57   Ct Angio Abd/pel W/ And/or W/o  06/28/2015   CLINICAL DATA:  renal artery stenosis, post angioplasty. Currently asymptomatic.  EXAM: CT ANGIOGRAPHY ABDOMEN  TECHNIQUE: Multidetector CT imaging of the abdomen was performed using the standard protocol during bolus administration of intravenous contrast. Multiplanar reconstructed images including MIPs were obtained and reviewed to evaluate the vascular anatomy.  CONTRAST:  86mL OMNIPAQUE IOHEXOL 350 MG/ML SOLN  COMPARISON:  COMPARISON None available  FINDINGS: ARTERIAL FINDINGS:  Aorta: Calcified plaque throughout. Fusiform ectasia of the infrarenal segment up to 2 cm, with some eccentric mural thrombus. No dissection, stenosis, or overt aneurysm.  Celiac axis: Calcified ostial plaque resulting in only mild short segment stenosis, patent distally  Superior mesenteric: Eccentric calcified nonocclusive ostial plaque. Replaced right hepatic arterial supply, an anatomic variant.  Left renal: Single, with mild mild nonocclusive partially calcified ostial plaque as well as eccentric plaque more distally over a length of approximately 1 cm, just proximal to the bifurcation of the main renal artery, without definite high-grade stenosis.  Right renal: Single, widely patent, with some scattered plaque distally.  Inferior mesenteric:  Patent  Left iliac:           Visualized proximal common iliac unremarkable.  Right iliac: Extensive calcified plaque through the length of the common iliac without high-grade stenosis.  Venous findings: Patent hepatic veins, portal vein, SMV, IMV, splenic vein, bilateral renal veins, and visualized segments of IVC.  Review of the MIP images confirms the above findings.  Nonvascular findings: Minimal dependent atelectasis in the visualized lung bases. Innumerable hepatic and small bilateral renal cysts with a dominant inferior cyst from the lower pole left  kidney measuring 3.6 cm. No hydronephrosis. Unremarkable  spleen, adrenal glands, pancreas. Stomach and visualized segments of small bowel and colon are nondilated. No free air. No ascites. No adenopathy. Mild spondylitic changes in the lower lumbar spine.  IMPRESSION: 1. Bilateral renal arterial plaque, left greater than right, without evidence of high-grade stenosis. 2. Extensive aortoiliac arterial plaque. 3. Additional  nonacute ancillary findings as above.   Electronically Signed   By: Lucrezia Europe M.D.   On: 06/28/2015 10:11   Ct Angio Abd/pel W/ And/or W/o  06/27/2015   CLINICAL DATA:  Hypertensive crisis. Evaluate for pulmonary embolism.  EXAM: CT ANGIOGRAPHY CHEST WITH CONTRAST  TECHNIQUE: Multidetector CT imaging of the chest was performed using the standard protocol during bolus administration of intravenous contrast. Multiplanar CT image reconstructions and MIPs were obtained to evaluate the vascular anatomy.  CONTRAST:  125mL OMNIPAQUE IOHEXOL 350 MG/ML SOLN  COMPARISON:  None.  FINDINGS: Vascular Findings:  There is adequate opacification of the pulmonary arterial system with the main pulmonary artery measuring 687 Hounsfield units. No discrete filling defects are seen within the pulmonary arterial tree to suggest pulmonary embolism. Normal caliber the main pulmonary artery.  Normal heart size. No pericardial effusion. Scattered coronary artery calcifications.  Scattered minimal amount of mixed calcified and noncalcified atherosclerotic plaque with a normal caliber thoracic aorta, not resulting in a hemodynamically significant stenosis. No definite thoracic aortic dissection or periaortic stranding on this nongated examination.  There is eccentric calcified plaque involving the origin right subclavian artery (representative axial image 26, series 501 all coronal image 61, series 504) potentially resulting in 8 hemodynamically significant narrowing at this location. There is a minimal amount of  eccentric mixed calcified and noncalcified atherosclerotic plaque involving the origin of the left subclavian artery, not definitely resulting in a hemodynamically significant stenosis. The branch vessels of the aortic arch are widely patent throughout their imaged course.  Review of the MIP images confirms the above findings.   ----------------------------------------------------------------------------------  Nonvascular Findings:  Moderate to severe apical predominant centrilobular emphysematous change.  Minimal dependent subpleural ground-glass atelectasis. Minimal grossly symmetric biapical pleural parenchymal thickening. No focal airspace opacities. No pleural effusion or pneumothorax. The central pulmonary airways are widely patent.  No discrete pulmonary nodules. No mediastinal, hilar or axillary lymphadenopathy.  Limited early arterial phase evaluation of the upper abdomen demonstrates a slightly ill-defined approximately 1.3 cm hypo attenuating (5 Hounsfield unit) cyst within in the dome of the left lobe of the liver (image 95, series 501). Additional scattered sub cm hypoattenuating hepatic lesions are too small to adequately characterize of favored to represent additional hepatic cysts. There is mild thickening of the medial limb of the left adrenal gland without discrete nodule.  No acute or aggressive osseous abnormalities.  Regional soft tissues appear normal. Normal appearance of the thyroid gland.  IMPRESSION: 1. No acute cardiopulmonary disease. Specifically, no evidence of pulmonary embolism. 2. Moderate to severe emphysematous change. 3. Minimal coronary artery calcifications. 4. Scattered atherosclerotic plaque within a normal caliber thoracic aorta. 5. Potential hemodynamically significant narrowing involving the origin of the right subclavian artery. Correlation for symptoms of right subclavian steal syndrome is recommended. Further evaluation could be performed with the acquisition of  bilateral upper extremity blood pressures as well as a carotid Doppler ultrasound (to evaluate for retrograde flow within the ipsilateral right vertebral artery) as indicated.   Electronically Signed   By: Sandi Mariscal M.D.   On: 06/27/2015 15:43    Micro Results      No results found for this or  any previous visit (from the past 240 hour(s)).     Today   Subjective    Jessica [name] today has no headache,no chest abdominal pain,no new weakness tingling or numbness, feels much better wants to go home today.    Objective   Blood pressure 174/61, pulse 61, temperature 98.1 F (36.7 C), temperature source Oral, resp. rate 20, height 5\' 2"  (1.575 m), weight 42.5 kg (93 lb 11.1 oz), last menstrual period 11/30/1976, SpO2 100 %.   Intake/Output Summary (Last 24 hours) at 06/28/15 1634 Last data filed at 06/28/15 0933  Gross per 24 hour  Intake 1372.5 ml  Output    550 ml  Net  822.5 ml    Exam Awake Alert, Oriented x 3, No new F.N deficits, Normal affect [place].AT,PERRAL Supple Neck,No JVD, No cervical lymphadenopathy appriciated.  Symmetrical Chest wall movement, Good air movement bilaterally, CTAB RRR,No Gallops,Rubs or new Murmurs, No Parasternal Heave +ve B.Sounds, Abd Soft, Non tender, No organomegaly appriciated, No rebound -guarding or rigidity. No Cyanosis, Clubbing or edema, No new Rash or bruise   Data Review   CBC w Diff: Lab Results  Component Value Date   WBC 7.7 06/28/2015   HGB 11.1* 06/28/2015   HCT 35.7* 06/28/2015   PLT 284 06/28/2015   LYMPHOPCT 29 06/27/2015   MONOPCT 8 06/27/2015   EOSPCT 4 06/27/2015   BASOPCT 1 06/27/2015    CMP: Lab Results  Component Value Date   NA 140 06/28/2015   K 3.7 06/28/2015   CL 113* 06/28/2015   CO2 21* 06/28/2015   BUN 15 06/28/2015   CREATININE 0.81 06/28/2015   PROT 7.0 06/27/2015   ALBUMIN 4.0 06/27/2015   BILITOT 0.4 06/27/2015   ALKPHOS 46 06/27/2015   AST 25 06/27/2015   ALT 14 06/27/2015  . Lab Results   Component Value Date   CHOL 148 06/28/2015   HDL 48 06/28/2015   LDLCALC 62 06/28/2015   TRIG 188* 06/28/2015   CHOLHDL 3.1 06/28/2015    No results found for: HGBA1C   Total Time in preparing paper work, data evaluation and todays exam - 35 minutes  Thurnell Lose M.D on 06/28/2015 at 4:34 PM  [facility]   Office  [phone]

## 2015-06-28 NOTE — Consult Note (Signed)
Referring Physician: Dr Candiss Norse    Chief Complaint: transient right leg weakness-right face and hand tingling  HPI:                                                                                                                                         Jessica [name] is an 81 y.o. female with a past medical history significant for HTN, hypertriglyceridemia, bilateral renal artery stenosis s/p balloon dilatation, admitted to [facility] after sustaining a transient episode of the above stated symptoms. Patient indicated that few days before admission to the hospital she experienced " couple of short lived episodes of tingling/numbness of the right hand and around the lower portion of the right side of my mouth". Then, yesterday she had simultaneous onset of tingling of the right lower face-right hand and heaviness of the right leg. She tells me that she noted those symptoms when she got up to let the dog out. By the time EMS arrived her symptoms were gone. Upon arrival to the ED she was noted to have blood pressure 225/83 and was treated with IV labetalol.  CT/MRI brain were personally reviewed and showed no acute abnormality. She takes aspirin 81 mg daily.   Date last known well: 06/27/15 Time last known well: unclear tPA Given: no, symptoms resolved NIHSS: 0 MRS: 0  Past Medical History  Diagnosis Date  . Hypertension   . Bilateral renal artery stenosis   . Hypertriglyceridemia   . Anemia   . GERD (gastroesophageal reflux disease)   . Headache     "probably weekly" (06/27/2015)  . Migraine     "maybe monthly" (06/27/2015)  . Arthritis     "thumbs" (06/27/2015)  . Chronic lower back pain     Past Surgical History  Procedure Laterality Date  . Balloon dilation  1980's?    "for renal stenosis"  . Toe surgery Bilateral     "straightened big toe"  . Vaginal hysterectomy  1973  . Tonsillectomy  ~ 1941    History reviewed. No pertinent family history. Social History:  reports that she has quit  smoking. Her smoking use included Cigarettes. She has a 10 pack-year smoking history. She has never used smokeless tobacco. She reports that she drinks about 0.6 - 1.8 oz of alcohol per week. She reports that she does not use illicit drugs.  Allergies: No Known Allergies  Medications:  Scheduled: . aspirin EC  81 mg Oral Daily  . enoxaparin (LOVENOX) injection  30 mg Subcutaneous Q24H  . fenofibrate  160 mg Oral Daily  . hydrALAZINE  25 mg Oral 3 times per day  . mirtazapine  15 mg Oral QHS  . pantoprazole  40 mg Oral Daily  . propranolol ER  120 mg Oral Daily  . sodium chloride  3 mL Intravenous Q12H    ROS:                                                                                                                                       History obtained from chart review and the patient  General ROS: negative for - chills, fatigue, fever, night sweats, weight gain or weight loss Psychological ROS: negative for - behavioral disorder, hallucinations, memory difficulties, mood swings or suicidal ideation Ophthalmic ROS: negative for - blurry vision, double vision, eye pain or loss of vision ENT ROS: negative for - epistaxis, nasal discharge, oral lesions, sore throat, tinnitus or vertigo Allergy and Immunology ROS: negative for - hives or itchy/watery eyes Hematological and Lymphatic ROS: negative for - bleeding problems, bruising or swollen lymph nodes Endocrine ROS: negative for - galactorrhea, hair pattern changes, polydipsia/polyuria or temperature intolerance Respiratory ROS: negative for - cough, hemoptysis, shortness of breath or wheezing Cardiovascular ROS: negative for - chest pain, dyspnea on exertion, edema or irregular heartbeat Gastrointestinal ROS: negative for - abdominal pain, diarrhea, hematemesis, nausea/vomiting or stool incontinence Genito-Urinary  ROS: negative for - dysuria, hematuria, incontinence or urinary frequency/urgency Musculoskeletal ROS: negative for - joint swelling or muscular weakness Neurological ROS: as noted in HPI Dermatological ROS: negative for rash and skin lesion changes    Physical exam: pleasant female in no apparent distress. Blood pressure 158/52, pulse 65, temperature 98.3 F (36.8 C), temperature source Oral, resp. rate 16, height 5' 2"  (1.575 m), weight 42.5 kg (93 lb 11.1 oz), last menstrual period 11/30/1976, SpO2 99 %. Head: normocephalic. Neck: supple, no bruits, no JVD. Cardiac: no murmurs. Lungs: clear. Abdomen: soft, no tender, no mass. Extremities: no edema. Skin: no rash  Neurologic Examination:                                                                                                      General: Mental Status: Alert, oriented, thought content appropriate.  Speech fluent without evidence of aphasia.  Able to follow 3 step commands without difficulty. Cranial Nerves: II: Discs flat bilaterally; Visual fields  grossly normal, pupils equal, round, reactive to light and accommodation III,IV, VI: ptosis not present, extra-ocular motions intact bilaterally V,VII: smile symmetric, facial light touch sensation normal bilaterally VIII: hearing normal bilaterally IX,X: uvula rises symmetrically XI: bilateral shoulder shrug XII: midline tongue extension without atrophy or fasciculations  Motor: Right : Upper extremity   5/5    Left:     Upper extremity   5/5  Lower extremity   5/5     Lower extremity   5/5 Tone and bulk:normal tone throughout; no atrophy noted Sensory: Pinprick and light touch intact throughout, bilaterally Deep Tendon Reflexes:  1+ all over Plantars: Right: downgoing   Left: downgoing Cerebellar: normal finger-to-nose,  normal heel-to-shin test Gait: No ataxia.    Results for orders placed or performed during the hospital encounter of 06/26/15 (from the past 48  hour(s))  Protime-INR     Status: None   Collection Time: 06/27/15 12:25 AM  Result Value Ref Range   Prothrombin Time 13.7 11.6 - 15.2 seconds   INR 1.03 0.00 - 1.49  APTT     Status: None   Collection Time: 06/27/15 12:25 AM  Result Value Ref Range   aPTT 30 24 - 37 seconds  CBC     Status: Abnormal   Collection Time: 06/27/15 12:25 AM  Result Value Ref Range   WBC 7.8 4.0 - 10.5 K/uL   RBC 4.77 3.87 - 5.11 MIL/uL   Hemoglobin 12.0 12.0 - 15.0 g/dL   HCT 38.7 36.0 - 46.0 %   MCV 81.1 78.0 - 100.0 fL   MCH 25.2 (L) 26.0 - 34.0 pg   MCHC 31.0 30.0 - 36.0 g/dL   RDW 15.7 (H) 11.5 - 15.5 %   Platelets 372 150 - 400 K/uL  Differential     Status: None   Collection Time: 06/27/15 12:25 AM  Result Value Ref Range   Neutrophils Relative % 58 43 - 77 %   Neutro Abs 4.5 1.7 - 7.7 K/uL   Lymphocytes Relative 29 12 - 46 %   Lymphs Abs 2.3 0.7 - 4.0 K/uL   Monocytes Relative 8 3 - 12 %   Monocytes Absolute 0.6 0.1 - 1.0 K/uL   Eosinophils Relative 4 0 - 5 %   Eosinophils Absolute 0.3 0.0 - 0.7 K/uL   Basophils Relative 1 0 - 1 %   Basophils Absolute 0.1 0.0 - 0.1 K/uL  Comprehensive metabolic panel     Status: Abnormal   Collection Time: 06/27/15 12:25 AM  Result Value Ref Range   Sodium 139 135 - 145 mmol/L   Potassium 4.0 3.5 - 5.1 mmol/L   Chloride 106 101 - 111 mmol/L   CO2 24 22 - 32 mmol/L   Glucose, Bld 133 (H) 65 - 99 mg/dL   BUN 24 (H) 6 - 20 mg/dL   Creatinine, Ser 0.88 0.44 - 1.00 mg/dL   Calcium 10.0 8.9 - 10.3 mg/dL   Total Protein 7.0 6.5 - 8.1 g/dL   Albumin 4.0 3.5 - 5.0 g/dL   AST 25 15 - 41 U/L   ALT 14 14 - 54 U/L   Alkaline Phosphatase 46 38 - 126 U/L   Total Bilirubin 0.4 0.3 - 1.2 mg/dL   GFR calc non Af Amer >60 >60 mL/min   GFR calc Af Amer >60 >60 mL/min    Comment: (NOTE) The eGFR has been calculated using the CKD EPI equation. This calculation has not been validated in all clinical situations.  eGFR's persistently <60 mL/min signify possible  Chronic Kidney Disease.    Anion gap 9 5 - 15  Troponin I     Status: None   Collection Time: 06/27/15 12:25 AM  Result Value Ref Range   Troponin I <0.03 <0.031 ng/mL    Comment:        NO INDICATION OF MYOCARDIAL INJURY.   I-stat troponin, ED (not at [facility])     Status: None   Collection Time: 06/27/15 12:31 AM  Result Value Ref Range   Troponin i, poc 0.00 0.00 - 0.08 ng/mL   Comment 3            Comment: Due to the release kinetics of cTnI, a negative result within the first hours of the onset of symptoms does not rule out myocardial infarction with certainty. If myocardial infarction is still suspected, repeat the test at appropriate intervals.   Basic metabolic panel     Status: Abnormal   Collection Time: 06/28/15  6:10 AM  Result Value Ref Range   Sodium 140 135 - 145 mmol/L   Potassium 3.7 3.5 - 5.1 mmol/L   Chloride 113 (H) 101 - 111 mmol/L   CO2 21 (L) 22 - 32 mmol/L   Glucose, Bld 95 65 - 99 mg/dL   BUN 15 6 - 20 mg/dL   Creatinine, Ser 0.81 0.44 - 1.00 mg/dL   Calcium 8.8 (L) 8.9 - 10.3 mg/dL   GFR calc non Af Amer >60 >60 mL/min   GFR calc Af Amer >60 >60 mL/min    Comment: (NOTE) The eGFR has been calculated using the CKD EPI equation. This calculation has not been validated in all clinical situations. eGFR's persistently <60 mL/min signify possible Chronic Kidney Disease.    Anion gap 6 5 - 15  CBC     Status: Abnormal   Collection Time: 06/28/15  6:10 AM  Result Value Ref Range   WBC 7.7 4.0 - 10.5 K/uL   RBC 4.41 3.87 - 5.11 MIL/uL   Hemoglobin 11.1 (L) 12.0 - 15.0 g/dL   HCT 35.7 (L) 36.0 - 46.0 %   MCV 81.0 78.0 - 100.0 fL   MCH 25.2 (L) 26.0 - 34.0 pg   MCHC 31.1 30.0 - 36.0 g/dL   RDW 15.7 (H) 11.5 - 15.5 %   Platelets 284 150 - 400 K/uL   Ct Head Wo Contrast  06/27/2015   CLINICAL DATA:  Stroke-like symptoms. Intermittent numbness to right side. Hypertension.  EXAM: CT HEAD WITHOUT CONTRAST  TECHNIQUE: Contiguous axial images were  obtained from the base of the skull through the vertex without intravenous contrast.  COMPARISON:  None.  FINDINGS: Mild generalized cerebral atrophy. No CT findings of acute ischemia or territorial infarct. No intracranial hemorrhage, mass effect, or midline shift. No hydrocephalus. The basilar cisterns are patent. No intracranial fluid collection. Atherosclerotic calcifications of the skullbase vasculature. Calvarium is intact. Included paranasal sinuses and mastoid air cells are well aerated.  IMPRESSION: Mild generalized atrophy without acute intracranial abnormality.   Electronically Signed   By: Jeb Levering M.D.   On: 06/27/2015 02:04   Mr Brain Wo Contrast  06/27/2015   CLINICAL DATA:  Intermittent facial and extremity numbness. Remote history of renal artery stenosis and hypertension.  EXAM: MRI HEAD WITHOUT CONTRAST  TECHNIQUE: Multiplanar, multiecho pulse sequences of the brain and surrounding structures were obtained without intravenous contrast.  COMPARISON:  CT head June 27, 2015 at 1:48 a.m.  FINDINGS: The ventricles  and sulci are normal for patient's age. No suspicious parenchymal signal, mass lesions, mass effect. A few scattered subcentimeter supratentorial white matter T2 hyperintensities compatible chronic small vessel ischemic disease, less than expected for age. No reduced diffusion to suggest acute ischemia. No susceptibility artifact to suggest hemorrhage.  No abnormal extra-axial fluid collections. No extra-axial masses though, contrast enhanced sequences would be more sensitive. Normal major intracranial vascular flow voids seen at the skull base. Tortuous LEFT vertebral artery mildly deforms the ventral LEFT medulla.  Ocular globes and orbital contents are nonsuspicious though not tailored for evaluation, bilateral ocular lens implants. No abnormal sellar expansion. Visualized paranasal sinuses and mastoid air cells are well-aerated. No suspicious calvarial bone marrow signal. No  abnormal sellar expansion. Craniocervical junction maintained. 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  IMPRESSION: No acute intracranial process. Normal noncontrast MRI of the head for age.   Electronically Signed   By: Elon Alas M.D.   On: 06/27/2015 05:57   Ct Angio Abd/pel W/ And/or W/o  06/28/2015   CLINICAL DATA:  renal artery stenosis, post angioplasty. Currently asymptomatic.  EXAM: CT ANGIOGRAPHY ABDOMEN  TECHNIQUE: Multidetector CT imaging of the abdomen was performed using the standard protocol during bolus administration of intravenous contrast. Multiplanar reconstructed images including MIPs were obtained and reviewed to evaluate the vascular anatomy.  CONTRAST:  43m OMNIPAQUE IOHEXOL 350 MG/ML SOLN  COMPARISON:  COMPARISON None available  FINDINGS: ARTERIAL FINDINGS:  Aorta: Calcified plaque throughout. Fusiform ectasia of the infrarenal segment up to 2 cm, with some eccentric mural thrombus. No dissection, stenosis, or overt aneurysm.  Celiac axis: Calcified ostial plaque resulting in only mild short segment stenosis, patent distally  Superior mesenteric: Eccentric calcified nonocclusive ostial plaque. Replaced right hepatic arterial supply, an anatomic variant.  Left renal: Single, with mild mild nonocclusive partially calcified ostial plaque as well as eccentric plaque more distally over a length of approximately 1 cm, just proximal to the bifurcation of the main renal artery, without definite high-grade stenosis.  Right renal: Single, widely patent, with some scattered plaque distally.  Inferior mesenteric:  Patent  Left iliac:           Visualized proximal common iliac unremarkable.  Right iliac: Extensive calcified plaque through the length of the common iliac without high-grade stenosis.  Venous findings: Patent hepatic veins, portal vein, SMV, IMV, splenic vein, bilateral renal veins, and visualized segments of IVC.  Review of the MIP images confirms the above findings.   Nonvascular findings: Minimal dependent atelectasis in the visualized lung bases. Innumerable hepatic and small bilateral renal cysts with a dominant inferior cyst from the lower pole left kidney measuring 3.6 cm. No hydronephrosis. Unremarkable spleen, adrenal glands, pancreas. Stomach and visualized segments of small bowel and colon are nondilated. No free air. No ascites. No adenopathy. Mild spondylitic changes in the lower lumbar spine.  IMPRESSION: 1. Bilateral renal arterial plaque, left greater than right, without evidence of high-grade stenosis. 2. Extensive aortoiliac arterial plaque. 3. Additional  nonacute ancillary findings as above.   Electronically Signed   By: DLucrezia EuropeM.D.   On: 06/28/2015 10:11   Ct Angio Abd/pel W/ And/or W/o  06/27/2015   CLINICAL DATA:  Hypertensive crisis. Evaluate for pulmonary embolism.  EXAM: CT ANGIOGRAPHY CHEST WITH CONTRAST  TECHNIQUE: Multidetector CT imaging of the chest was performed using the standard protocol during bolus administration of intravenous contrast. Multiplanar CT image reconstructions and MIPs were obtained to evaluate the vascular anatomy.  CONTRAST:  1056mOMNIPAQUE IOHEXOL  350 MG/ML SOLN  COMPARISON:  None.  FINDINGS: Vascular Findings:  There is adequate opacification of the pulmonary arterial system with the main pulmonary artery measuring 687 Hounsfield units. No discrete filling defects are seen within the pulmonary arterial tree to suggest pulmonary embolism. Normal caliber the main pulmonary artery.  Normal heart size. No pericardial effusion. Scattered coronary artery calcifications.  Scattered minimal amount of mixed calcified and noncalcified atherosclerotic plaque with a normal caliber thoracic aorta, not resulting in a hemodynamically significant stenosis. No definite thoracic aortic dissection or periaortic stranding on this nongated examination.  There is eccentric calcified plaque involving the origin right subclavian artery  (representative axial image 26, series 501 all coronal image 61, series 504) potentially resulting in 8 hemodynamically significant narrowing at this location. There is a minimal amount of eccentric mixed calcified and noncalcified atherosclerotic plaque involving the origin of the left subclavian artery, not definitely resulting in a hemodynamically significant stenosis. The branch vessels of the aortic arch are widely patent throughout their imaged course.  Review of the MIP images confirms the above findings.   ----------------------------------------------------------------------------------  Nonvascular Findings:  Moderate to severe apical predominant centrilobular emphysematous change.  Minimal dependent subpleural ground-glass atelectasis. Minimal grossly symmetric biapical pleural parenchymal thickening. No focal airspace opacities. No pleural effusion or pneumothorax. The central pulmonary airways are widely patent.  No discrete pulmonary nodules. No mediastinal, hilar or axillary lymphadenopathy.  Limited early arterial phase evaluation of the upper abdomen demonstrates a slightly ill-defined approximately 1.3 cm hypo attenuating (5 Hounsfield unit) cyst within in the dome of the left lobe of the liver (image 95, series 501). Additional scattered sub cm hypoattenuating hepatic lesions are too small to adequately characterize of favored to represent additional hepatic cysts. There is mild thickening of the medial limb of the left adrenal gland without discrete nodule.  No acute or aggressive osseous abnormalities.  Regional soft tissues appear normal. Normal appearance of the thyroid gland.  IMPRESSION: 1. No acute cardiopulmonary disease. Specifically, no evidence of pulmonary embolism. 2. Moderate to severe emphysematous change. 3. Minimal coronary artery calcifications. 4. Scattered atherosclerotic plaque within a normal caliber thoracic aorta. 5. Potential hemodynamically significant narrowing involving  the origin of the right subclavian artery. Correlation for symptoms of right subclavian steal syndrome is recommended. Further evaluation could be performed with the acquisition of bilateral upper extremity blood pressures as well as a carotid Doppler ultrasound (to evaluate for retrograde flow within the ipsilateral right vertebral artery) as indicated.   Electronically Signed   By: Sandi Mariscal M.D.   On: 06/27/2015 15:43    Assessment: 81 y.o. female with a constellation of transient symptoms suggestive of recurrent TIA's involving left brain. CT/MRI brain unremarkable for acute abnormality.  Lipid profile, HgbA1c, TTE and CUS results pending. Consider switching to plavix. Stroke team will follow up tomorrow.  Stroke Risk Factors - age, HTN, hyperlipidemia  Plan: 1. HgbA1c, fasting lipid panel 2. MRI, MRA  of the brain without contrast 3. Echocardiogram 4. Carotid dopplers 5. Prophylactic therapy-plavix 6. Risk factor modification 7. Telemetry monitoring 8. Frequent neuro checks 9. PT/OT SLP (NO NEEDED AT THIS TIME)  Dorian Pod, MD [facility] [phone]  06/28/2015, 12:04 PM

## 2015-06-28 NOTE — Care Management Note (Signed)
Case Management Note  Patient Details  Name: Jessica [name] MRN: [id number] Date of Birth: May 20, 1932  Subjective/Objective:   Patient lives alone, pta indep.  NCM will cont to follow for dc needs.                 Action/Plan:   Expected Discharge Date:                  Expected Discharge Plan:  Home/Self Care  In-House Referral:     Discharge planning Services  CM Consult  Post Acute Care Choice:    Choice offered to:     DME Arranged:    DME Agency:     HH Arranged:    [place] Agency:     Status of Service:  Completed, signed off  Medicare Important Message Given:    Date Medicare IM Given:    Medicare IM give by:    Date Additional Medicare IM Given:    Additional Medicare Important Message give by:     If discussed at [place] of Stay Meetings, dates discussed:    Additional Comments:  Zenon Mayo, RN 06/28/2015, 4:34 PM

## 2015-06-28 NOTE — Consult Note (Addendum)
CONSULT NOTE   Referral: [facility]  MRN : [id number]  Reason for Consult: subclavian stenosis without significant BP differences in bilateral UEs.   History of Present Illness: 81 y/o female with complaints of right hand and right perioral numbness and tingling followed by an episode of weakness in the right leg and foot with difficulty ambulating.  Both episodes resolved.   CVA/TIA was suspected CT/MRI brain showed no acute abnormality.  She was noted to have hypertension urgency in the ED 225/83 and received IV labetalol.   She is currently awaiting carotid duplex studies.  Past medical history includes: tobacco use history 10 years, HTN managed with propranolol and accupril, hypertriglyceridemia managed with fenfibrate, bilateral renal artery stenosis s/p balloon dilatation and daily 81 mg aspirin, admitted to [facility] after sustaining a transient episode of the above stated symptoms.    Current Facility-Administered Medications  Medication Dose Route Frequency Provider Last Rate Last Dose  . 0.9 %  sodium chloride infusion   Intravenous Continuous Thurnell Lose, MD      . acetaminophen (TYLENOL) tablet 650 mg  650 mg Oral Q6H PRN Truett Mainland, DO       Or  . acetaminophen (TYLENOL) suppository 650 mg  650 mg Rectal Q6H PRN Truett Mainland, DO      . alum & mag hydroxide-simeth (MAALOX/MYLANTA) 200-200-20 MG/5ML suspension 30 mL  30 mL Oral Q6H PRN Truett Mainland, DO      . aspirin EC tablet 81 mg  81 mg Oral Daily Tanna Savoy Stinson, DO   81 mg at 06/28/15 1038  . enoxaparin (LOVENOX) injection 30 mg  30 mg Subcutaneous Q24H Etta Quill, DO   30 mg at 06/27/15 2223  . fenofibrate tablet 160 mg  160 mg Oral Daily Tanna Savoy Stinson, DO   160 mg at 06/28/15 1038  . hydrALAZINE (APRESOLINE) tablet 25 mg  25 mg Oral 3 times per day Truett Mainland, DO   25 mg at 06/28/15 0610  . mirtazapine (REMERON) tablet 15 mg  15 mg Oral QHS Tanna Savoy Stinson, DO   15 mg at 06/27/15 2223  .  ondansetron (ZOFRAN) tablet 4 mg  4 mg Oral Q6H PRN Tanna Savoy Stinson, DO       Or  . ondansetron [facility]) injection 4 mg  4 mg Intravenous Q6H PRN Tanna Savoy Stinson, DO      . pantoprazole (PROTONIX) EC tablet 40 mg  40 mg Oral Daily Tanna Savoy Stinson, DO   40 mg at 06/28/15 1044  . propranolol ER (INDERAL LA) 24 hr capsule 120 mg  120 mg Oral Daily Tanna Savoy Stinson, DO   120 mg at 06/28/15 1038  . senna-docusate (Senokot-S) tablet 1 tablet  1 tablet Oral QHS PRN Tanna Savoy Stinson, DO      . sodium chloride 0.9 % injection 3 mL  3 mL Intravenous Q12H Tanna Savoy Stinson, DO   3 mL at 06/28/15 1044    Pt meds include: Statin :No Betablocker: Yes ASA: Yes Other anticoagulants/antiplatelets: none   Past Medical History  Diagnosis Date  . Hypertension   . Bilateral renal artery stenosis   . Hypertriglyceridemia   . Anemia   . GERD (gastroesophageal reflux disease)   . Headache     "probably weekly" (06/27/2015)  . Migraine     "maybe monthly" (06/27/2015)  . Arthritis     "thumbs" (06/27/2015)  . Chronic lower back pain  Past Surgical History  Procedure Laterality Date  . Balloon dilation  1980's?    "for renal stenosis"  . Toe surgery Bilateral     "straightened big toe"  . Vaginal hysterectomy  1973  . Tonsillectomy  ~ 1941    Social History History  Substance Use Topics  . Smoking status: Former Smoker -- 0.50 packs/day for 20 years    Types: Cigarettes  . Smokeless tobacco: Never Used     Comment: 'quit smoking in the 1990's?"  . Alcohol Use: 0.6 - 1.8 oz/week    0-2 Standard drinks or equivalent, 1 Cans of beer per week     Comment: 06/27/2015 "a beer or a glass of wine once/wk"    Family History HTN, CAD  No Known Allergies   REVIEW OF SYSTEMS  General: [ ]  Weight loss, [ ]  Fever, [ ]  chills Neurologic: [ ]  Dizziness, [ ]  Blackouts, [ ]  Seizure, [ ]  Stroke, [ ]  "Mini stroke", [ ]  Slurred speech, [ ]  Temporary blindness; [ x] weakness in arms or legs, [ ]  Hoarseness  [ ]  Dysphagia Cardiac: [ ]  Chest pain/pressure, [ ]  Shortness of breath at rest [ ]  Shortness of breath with exertion, [ ]  Atrial fibrillation or irregular heartbeat  Vascular: [ ]  Pain in legs with walking, [ ]  Pain in legs at rest, [ ]  Pain in legs at night,  [ ]  Non-healing ulcer, [ ]  Blood clot in vein/DVT,   Pulmonary: [ ]  Home oxygen, [ ]  Productive cough, [ ]  Coughing up blood, [ ]  Asthma,  [ ]  Wheezing [ ]  COPD Musculoskeletal:  [ ]  Arthritis, [ ]  Low back pain, [ ]  Joint pain Hematologic: [ ]  Easy Bruising, [ ]  Anemia; [ ]  Hepatitis Gastrointestinal: [ ]  Blood in stool, [ ]  Gastroesophageal Reflux/heartburn, Urinary: [ ]  chronic Kidney disease, [ ]  on HD - [ ]  MWF or [ ]  TTHS, [ ]  Burning with urination, [ ]  Difficulty urinating Skin: [ ]  Rashes, [ ]  Wounds Psychological: [ ]  Anxiety, [ ]  Depression   Physical Examination Filed Vitals:   06/28/15 1038 06/28/15 1043 06/28/15 1134 06/28/15 1136  BP: 122/50 122/50 166/65 158/52  Pulse: 68 63 63 65  Temp:      TempSrc:      Resp:   16   Height:      Weight:      SpO2:   98% 99%   Body mass index is 17.13 kg/(m^2).  General:  WDWN in NAD Gait: Normal HENT: WNL Eyes: Pupils equal Pulmonary: normal non-labored breathing , without Rales, rhonchi,  wheezing Cardiac: RRR, without  Murmurs, rubs or gallops; No carotid bruits Abdomen: soft, NT, no masses Skin: no rashes, ulcers noted;  no Gangrene , no cellulitis; no open wounds;  Vascular Exam/Pulses:Palpable radial and brachial bilaterally. Musculoskeletal: no muscle wasting or atrophy; no edema  Neurologic: A&O X 3; Appropriate Affect ; SENSATION: normal; MOTOR FUNCTION: 5/5 Symmetric; speech is fluent/normal   Significant Diagnostic Studies: CBC Lab Results  Component Value Date   WBC 7.7 06/28/2015   HGB 11.1* 06/28/2015   HCT 35.7* 06/28/2015   MCV 81.0 06/28/2015   PLT 284 06/28/2015    BMET    Component Value Date/Time   NA 140 06/28/2015 0610   K 3.7  06/28/2015 0610   CL 113* 06/28/2015 0610   CO2 21* 06/28/2015 0610   GLUCOSE 95 06/28/2015 0610   BUN 15 06/28/2015 0610   CREATININE 0.81 06/28/2015 0610  CALCIUM 8.8* 06/28/2015 0610   GFRNONAA >60 06/28/2015 0610   GFRAA >60 06/28/2015 0610   Estimated Creatinine Clearance: 37.2 mL/min (by C-G formula based on Cr of 0.81).  COAG Lab Results  Component Value Date   INR 1.03 06/27/2015     Radiology: Ct Head Wo Contrast  06/27/2015   CLINICAL DATA:  Stroke-like symptoms. Intermittent numbness to right side. Hypertension.  EXAM: CT HEAD WITHOUT CONTRAST  TECHNIQUE: Contiguous axial images were obtained from the base of the skull through the vertex without intravenous contrast.  COMPARISON:  None.  FINDINGS: Mild generalized cerebral atrophy. No CT findings of acute ischemia or territorial infarct. No intracranial hemorrhage, mass effect, or midline shift. No hydrocephalus. The basilar cisterns are patent. No intracranial fluid collection. Atherosclerotic calcifications of the skullbase vasculature. Calvarium is intact. Included paranasal sinuses and mastoid air cells are well aerated.  IMPRESSION: Mild generalized atrophy without acute intracranial abnormality.   Electronically Signed   By: Jeb Levering M.D.   On: 06/27/2015 02:04   Mr Brain Wo Contrast  06/27/2015   CLINICAL DATA:  Intermittent facial and extremity numbness. Remote history of renal artery stenosis and hypertension.  EXAM: MRI HEAD WITHOUT CONTRAST  TECHNIQUE: Multiplanar, multiecho pulse sequences of the brain and surrounding structures were obtained without intravenous contrast.  COMPARISON:  CT head June 27, 2015 at 1:48 a.m.  FINDINGS: The ventricles and sulci are normal for patient's age. No suspicious parenchymal signal, mass lesions, mass effect. A few scattered subcentimeter supratentorial white matter T2 hyperintensities compatible chronic small vessel ischemic disease, less than expected for age. No reduced  diffusion to suggest acute ischemia. No susceptibility artifact to suggest hemorrhage.  No abnormal extra-axial fluid collections. No extra-axial masses though, contrast enhanced sequences would be more sensitive. Normal major intracranial vascular flow voids seen at the skull base. Tortuous LEFT vertebral artery mildly deforms the ventral LEFT medulla.  Ocular globes and orbital contents are nonsuspicious though not tailored for evaluation, bilateral ocular lens implants. No abnormal sellar expansion. Visualized paranasal sinuses and mastoid air cells are well-aerated. No suspicious calvarial bone marrow signal. No abnormal sellar expansion. Craniocervical junction maintained. 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  IMPRESSION: No acute intracranial process. Normal noncontrast MRI of the head for age.   Electronically Signed   By: Elon Alas M.D.   On: 06/27/2015 05:57   Ct Angio Abd/pel W/ And/or W/o  06/28/2015   CLINICAL DATA:  renal artery stenosis, post angioplasty. Currently asymptomatic.  EXAM: CT ANGIOGRAPHY ABDOMEN  TECHNIQUE: Multidetector CT imaging of the abdomen was performed using the standard protocol during bolus administration of intravenous contrast. Multiplanar reconstructed images including MIPs were obtained and reviewed to evaluate the vascular anatomy.  CONTRAST:  74mL OMNIPAQUE IOHEXOL 350 MG/ML SOLN  COMPARISON:  COMPARISON None available  FINDINGS: ARTERIAL FINDINGS:  Aorta: Calcified plaque throughout. Fusiform ectasia of the infrarenal segment up to 2 cm, with some eccentric mural thrombus. No dissection, stenosis, or overt aneurysm.  Celiac axis: Calcified ostial plaque resulting in only mild short segment stenosis, patent distally  Superior mesenteric: Eccentric calcified nonocclusive ostial plaque. Replaced right hepatic arterial supply, an anatomic variant.  Left renal: Single, with mild mild nonocclusive partially calcified ostial plaque as well as eccentric  plaque more distally over a length of approximately 1 cm, just proximal to the bifurcation of the main renal artery, without definite high-grade stenosis.  Right renal: Single, widely patent, with some scattered plaque distally.  Inferior mesenteric:  Patent  Left  iliac:           Visualized proximal common iliac unremarkable.  Right iliac: Extensive calcified plaque through the length of the common iliac without high-grade stenosis.  Venous findings: Patent hepatic veins, portal vein, SMV, IMV, splenic vein, bilateral renal veins, and visualized segments of IVC.  Review of the MIP images confirms the above findings.  Nonvascular findings: Minimal dependent atelectasis in the visualized lung bases. Innumerable hepatic and small bilateral renal cysts with a dominant inferior cyst from the lower pole left kidney measuring 3.6 cm. No hydronephrosis. Unremarkable spleen, adrenal glands, pancreas. Stomach and visualized segments of small bowel and colon are nondilated. No free air. No ascites. No adenopathy. Mild spondylitic changes in the lower lumbar spine.  IMPRESSION: 1. Bilateral renal arterial plaque, left greater than right, without evidence of high-grade stenosis. 2. Extensive aortoiliac arterial plaque. 3. Additional  nonacute ancillary findings as above.   Electronically Signed   By: Lucrezia Europe M.D.   On: 06/28/2015 10:11   Ct Angio Abd/pel W/ And/or W/o  06/27/2015   CLINICAL DATA:  Hypertensive crisis. Evaluate for pulmonary embolism.  EXAM: CT ANGIOGRAPHY CHEST WITH CONTRAST  TECHNIQUE: Multidetector CT imaging of the chest was performed using the standard protocol during bolus administration of intravenous contrast. Multiplanar CT image reconstructions and MIPs were obtained to evaluate the vascular anatomy.  CONTRAST:  111mL OMNIPAQUE IOHEXOL 350 MG/ML SOLN  COMPARISON:  None.  FINDINGS: Vascular Findings:  There is adequate opacification of the pulmonary arterial system with the main pulmonary artery  measuring 687 Hounsfield units. No discrete filling defects are seen within the pulmonary arterial tree to suggest pulmonary embolism. Normal caliber the main pulmonary artery.  Normal heart size. No pericardial effusion. Scattered coronary artery calcifications.  Scattered minimal amount of mixed calcified and noncalcified atherosclerotic plaque with a normal caliber thoracic aorta, not resulting in a hemodynamically significant stenosis. No definite thoracic aortic dissection or periaortic stranding on this nongated examination.  There is eccentric calcified plaque involving the origin right subclavian artery (representative axial image 26, series 501 all coronal image 61, series 504) potentially resulting in 8 hemodynamically significant narrowing at this location. There is a minimal amount of eccentric mixed calcified and noncalcified atherosclerotic plaque involving the origin of the left subclavian artery, not definitely resulting in a hemodynamically significant stenosis. The branch vessels of the aortic arch are widely patent throughout their imaged course.  Review of the MIP images confirms the above findings.   ----------------------------------------------------------------------------------  Nonvascular Findings:  Moderate to severe apical predominant centrilobular emphysematous change.  Minimal dependent subpleural ground-glass atelectasis. Minimal grossly symmetric biapical pleural parenchymal thickening. No focal airspace opacities. No pleural effusion or pneumothorax. The central pulmonary airways are widely patent.  No discrete pulmonary nodules. No mediastinal, hilar or axillary lymphadenopathy.  Limited early arterial phase evaluation of the upper abdomen demonstrates a slightly ill-defined approximately 1.3 cm hypo attenuating (5 Hounsfield unit) cyst within in the dome of the left lobe of the liver (image 95, series 501). Additional scattered sub cm hypoattenuating hepatic lesions are too small  to adequately characterize of favored to represent additional hepatic cysts. There is mild thickening of the medial limb of the left adrenal gland without discrete nodule.  No acute or aggressive osseous abnormalities.  Regional soft tissues appear normal. Normal appearance of the thyroid gland.  IMPRESSION: 1. No acute cardiopulmonary disease. Specifically, no evidence of pulmonary embolism. 2. Moderate to severe emphysematous change. 3. Minimal coronary artery calcifications. 4. Scattered  atherosclerotic plaque within a normal caliber thoracic aorta. 5. Potential hemodynamically significant narrowing involving the origin of the right subclavian artery. Correlation for symptoms of right subclavian steal syndrome is recommended. Further evaluation could be performed with the acquisition of bilateral upper extremity blood pressures as well as a carotid Doppler ultrasound (to evaluate for retrograde flow within the ipsilateral right vertebral artery) as indicated.   Electronically Signed   By: Sandi Mariscal M.D.   On: 06/27/2015 15:43    Non-Invasive Vascular Imaging:  Carotid duplex Bilateral: 1-39% ICA stenosis. Vertebral artery flow is antegrade  ASSESSMENT/PLAN:  1. Possible L side TIA,  2. No evidence of ICA stenoses 3. R SCA stenosis <50%    No intervention needed on either carotid artery  No intervention needed on R SCA stenosis: patient's systolic BP earlier was 163 on the right and 158 on the left.  Repeated at  1340 today,  right 190/80 and left 174/61.  This suggests the R SCA stenosis is not hemodynamically significant.   Laurence Slate [facility] 06/28/2015 12:45 PM   Addendum  I have independently interviewed and examined the patient, and I agree with the physician assistant's findings.  I reviewed the patient's chest CTA and B carotid duplex.  Pt's hx is suggestive of a L sided TIA.  B carotid duplex, however, suggests the ICA disease is NOT the etiology.  Despite the CTA chest  reading, the ELEVATED BP in R arm demonstrates the <50% stenosis in the proximal R SCA is not hemodynamically significant.  I doubt this patient's R arm sx are related the R SCA stenosis.  The L SCA has minimal stenosis on CTA, so no intervention on that side is needed either.    - I doubt that any Vascular Surgery intervention are needed in this patient. - Thank you for the consult!  Adele Barthel, MD [facility] Office: [phone] Pager: [phone]  06/28/2015, 3:08 PM

## 2015-06-28 NOTE — Progress Notes (Signed)
  Echocardiogram 2D Echocardiogram has been performed.  Jessica [name] 06/28/2015, 1:21 PM

## 2015-06-28 NOTE — Discharge Instructions (Signed)
Follow with Primary MD Sheela Stack, MD in 7 days   Get CBC, CMP, A1c, 2 view Chest X ray checked  by Primary MD next visit.    Activity: As tolerated with Full fall precautions use walker/cane & assistance as needed   Disposition Home     Diet: Heart Healthy    For Heart failure patients - Check your Weight same time everyday, if you gain over 2 pounds, or you develop in leg swelling, experience more shortness of breath or chest pain, call your Primary MD immediately. Follow Cardiac Low Salt Diet and 1.5 lit/day fluid restriction.   On your next visit with your primary care physician please Get Medicines reviewed and adjusted.   Please request your Prim.MD to go over all Hospital Tests and Procedure/Radiological results at the follow up, please get all Hospital records sent to your Prim MD by signing hospital release before you go home.   If you experience worsening of your admission symptoms, develop shortness of breath, life threatening emergency, suicidal or homicidal thoughts you must seek medical attention immediately by calling 911 or calling your MD immediately  if symptoms less severe.  You Must read complete instructions/literature along with all the possible adverse reactions/side effects for all the Medicines you take and that have been prescribed to you. Take any new Medicines after you have completely understood and accpet all the possible adverse reactions/side effects.   Do not drive, operating heavy machinery, perform activities at heights, swimming or participation in water activities or provide baby sitting services if your were admitted for syncope or siezures until you have seen by Primary MD or a Neurologist and advised to do so again.  Do not drive when taking Pain medications.    Do not take more than prescribed Pain, Sleep and Anxiety Medications  Special Instructions: If you have smoked or chewed Tobacco  in the last 2 yrs please stop smoking, stop any  regular Alcohol  and or any Recreational drug use.  Wear Seat belts while driving.   Please note  You were cared for by a hospitalist during your hospital stay. If you have any questions about your discharge medications or the care you received while you were in the hospital after you are discharged, you can call the unit and asked to speak with the hospitalist on call if the hospitalist that took care of you is not available. Once you are discharged, your primary care physician will handle any further medical issues. Please note that NO REFILLS for any discharge medications will be authorized once you are discharged, as it is imperative that you return to your primary care physician (or establish a relationship with a primary care physician if you do not have one) for your aftercare needs so that they can reassess your need for medications and monitor your lab values.

## 2015-06-28 NOTE — Progress Notes (Signed)
Nsg Discharge Note  Admit Date:  06/26/2015 Discharge date: 06/28/2015   [name] to be D/C'd Home per MD order.  AVS completed.  Copy for chart, and copy for patient signed, and dated. Patient/caregiver able to verbalize understanding.  Discharge Medication:   Medication List    STOP taking these medications        aspirin EC 81 MG tablet      TAKE these medications        amLODipine 10 MG tablet  Commonly known as:  NORVASC  Take 1 tablet (10 mg total) by mouth daily.     clopidogrel 75 MG tablet  Commonly known as:  PLAVIX  Take 1 tablet (75 mg total) by mouth daily.     fenofibrate 160 MG tablet  Take 160 mg by mouth daily.     mirtazapine 15 MG tablet  Commonly known as:  REMERON  Take 15 mg by mouth at bedtime.     omeprazole 20 MG capsule  Commonly known as:  PRILOSEC  TAKE 1 CAPSULE IN THE EVENING     propranolol ER 120 MG 24 hr capsule  Commonly known as:  INDERAL LA  Take 120 mg by mouth daily.     quinapril 20 MG tablet  Commonly known as:  ACCUPRIL  Take 20 mg by mouth at bedtime.     Vitamin D (Ergocalciferol) 50000 UNITS Caps capsule  Commonly known as:  DRISDOL  Take 1 capsule by mouth once a week.        Discharge Assessment: Filed Vitals:   06/28/15 1436  BP: 174/61  Pulse: 61  Temp:   Resp: 20   Skin clean, dry and intact without evidence of skin break down, no evidence of skin tears noted. IV catheter discontinued intact. Site without signs and symptoms of complications - no redness or edema noted at insertion site, patient denies c/o pain - only slight tenderness at site.  Dressing with slight pressure applied.  D/c Instructions-Education: Discharge instructions given to patient/family with verbalized understanding. D/c education completed with patient/family including follow up instructions, medication list, d/c activities limitations if indicated, with other d/c instructions as indicated by MD - patient able to verbalize  understanding, all questions fully answered. Patient instructed to return to ED, call 911, or call MD for any changes in condition.  Patient escorted via [place], and D/C home via private auto.  Loveta Dellis Margaretha [place], RN 06/28/2015 6:14 PM

## 2015-06-28 NOTE — Evaluation (Signed)
Physical Therapy Evaluation Patient Details Name: Jessica [name] MRN: [id number] DOB: [birth year] Today's Date: 06/28/2015   History of Present Illness  Patient is a 81 y/o female HTN, hypertriglyceridemia, bilateral renal artery stenosis s/p balloon dilatation, admitted to [facility] after sustaining a transient episode of right sided weakness, tingling/numbness which resolved after arrival to ED. In ED, pt's BP was blood pressure 225/83. Brain MRI, CT (-).  Clinical Impression  Patient presents close to functional baseline and able to ambulate community distances, while performing balance activities and negotiate steps without difficulty. Pt reports all symptoms have resolved. Encouraged daily ambulation while in hospital. Pt does not require further skilled therapy services. Discharge from therapy.    Follow Up Recommendations No PT follow up    Equipment Recommendations  None recommended by PT    Recommendations for Other Services       Precautions / Restrictions Precautions Precautions: None Restrictions Weight Bearing Restrictions: No      Mobility  Bed Mobility               General bed mobility comments: Sitting in chair upon PT arrival.   Transfers Overall transfer level: Independent Equipment used: None                Ambulation/Gait Ambulation/Gait assistance: Modified independent (Device/Increase time) Ambulation Distance (Feet): 250 Feet Assistive device: None Gait Pattern/deviations: Step-through pattern;Decreased stride length   Gait velocity interpretation: at or above normal speed for age/gender General Gait Details: Pt with steady gait. No LOB even with head turns/challenges.  Stairs Stairs: Yes Stairs assistance: Modified independent (Device/Increase time) Stair Management: One rail Right;Alternating pattern Number of Stairs: 4 General stair comments: safe technique.   Wheelchair Mobility    Modified Rankin (Stroke Patients Only) Modified  Rankin (Stroke Patients Only) Pre-Morbid Rankin Score: No symptoms Modified Rankin: No symptoms     Balance Overall balance assessment: No apparent balance deficits (not formally assessed)                                           Pertinent Vitals/Pain Pain Assessment: No/denies pain    Home Living Family/patient expects to be discharged to:: Private residence Living Arrangements: Alone   Type of Home: House Home Access: Stairs to enter Entrance Stairs-Rails: Right Entrance Stairs-Number of Steps: 3 Home Layout: One level Home Equipment: None      Prior Function Level of Independence: Independent         Comments: Walks dog daily, drives.      Hand Dominance        Extremity/Trunk Assessment   Upper Extremity Assessment: Defer to OT evaluation;Overall WFL for tasks assessed           Lower Extremity Assessment: Overall WFL for tasks assessed         Communication   Communication: No difficulties  Cognition Arousal/Alertness: Awake/alert Behavior During Therapy: WFL for tasks assessed/performed Overall Cognitive Status: Within Functional Limits for tasks assessed                      General Comments      Exercises        Assessment/Plan    PT Assessment Patent does not need any further PT services  PT Diagnosis     PT Problem List    PT Treatment Interventions     PT Goals (Current  goals can be found in the Care Plan section) Acute Rehab PT Goals PT Goal Formulation: All assessment and education complete, DC therapy    Frequency     Barriers to discharge        Co-evaluation               End of Session Equipment Utilized During Treatment: Gait belt Activity Tolerance: Patient tolerated treatment well Patient left: in chair;with call bell/phone within reach Nurse Communication: Mobility status         Time: 4356-8616 PT Time Calculation (min) (ACUTE ONLY): 10 min   Charges:   PT  Evaluation $Initial PT Evaluation Tier I: 1 Procedure     PT G Codes:        Ethin Drummond A Larwence Tu 06/28/2015, 4:45 PM Wray Kearns, [place], DPT [phone]

## 2015-06-28 NOTE — Progress Notes (Signed)
   Daily Progress Note  Pt was not in room when I came by.  I reviewed the chest CTA.  The R SCA stenosis is <50% luminal diameter, thus not hemodynamically significant.  No intervention is needed.  Will check on patient later when she is in her room.  Adele Barthel, MD [facility] Office: [phone] Pager: [phone]  06/28/2015, 2:13 PM

## 2015-06-29 LAB — HEMOGLOBIN A1C
HEMOGLOBIN A1C: 6.2 % — AB (ref 4.8–5.6)
Mean Plasma Glucose: 131 mg/dL

## 2015-07-01 DIAGNOSIS — I10 Essential (primary) hypertension: Secondary | ICD-10-CM | POA: Diagnosis not present

## 2015-07-01 DIAGNOSIS — R7301 Impaired fasting glucose: Secondary | ICD-10-CM | POA: Diagnosis not present

## 2015-07-10 DIAGNOSIS — Z681 Body mass index (BMI) 19 or less, adult: Secondary | ICD-10-CM | POA: Diagnosis not present

## 2015-07-10 DIAGNOSIS — R202 Paresthesia of skin: Secondary | ICD-10-CM | POA: Diagnosis not present

## 2015-07-10 DIAGNOSIS — I152 Hypertension secondary to endocrine disorders: Secondary | ICD-10-CM | POA: Diagnosis not present

## 2015-07-10 DIAGNOSIS — R7301 Impaired fasting glucose: Secondary | ICD-10-CM | POA: Diagnosis not present

## 2015-07-15 DIAGNOSIS — I152 Hypertension secondary to endocrine disorders: Secondary | ICD-10-CM | POA: Diagnosis not present

## 2015-07-27 DIAGNOSIS — Z681 Body mass index (BMI) 19 or less, adult: Secondary | ICD-10-CM | POA: Diagnosis not present

## 2015-07-27 DIAGNOSIS — R7301 Impaired fasting glucose: Secondary | ICD-10-CM | POA: Diagnosis not present

## 2015-07-27 DIAGNOSIS — R209 Unspecified disturbances of skin sensation: Secondary | ICD-10-CM | POA: Diagnosis not present

## 2015-07-27 DIAGNOSIS — I701 Atherosclerosis of renal artery: Secondary | ICD-10-CM | POA: Diagnosis not present

## 2015-07-27 DIAGNOSIS — I10 Essential (primary) hypertension: Secondary | ICD-10-CM | POA: Diagnosis not present

## 2015-07-27 DIAGNOSIS — E785 Hyperlipidemia, unspecified: Secondary | ICD-10-CM | POA: Diagnosis not present

## 2015-08-26 DIAGNOSIS — E785 Hyperlipidemia, unspecified: Secondary | ICD-10-CM | POA: Diagnosis not present

## 2015-08-26 DIAGNOSIS — N390 Urinary tract infection, site not specified: Secondary | ICD-10-CM | POA: Diagnosis not present

## 2015-08-26 DIAGNOSIS — R8290 Unspecified abnormal findings in urine: Secondary | ICD-10-CM | POA: Diagnosis not present

## 2015-08-26 DIAGNOSIS — M810 Age-related osteoporosis without current pathological fracture: Secondary | ICD-10-CM | POA: Diagnosis not present

## 2015-08-26 DIAGNOSIS — I10 Essential (primary) hypertension: Secondary | ICD-10-CM | POA: Diagnosis not present

## 2015-08-26 DIAGNOSIS — R7301 Impaired fasting glucose: Secondary | ICD-10-CM | POA: Diagnosis not present

## 2015-08-28 DIAGNOSIS — K116 Mucocele of salivary gland: Secondary | ICD-10-CM | POA: Diagnosis not present

## 2015-08-28 DIAGNOSIS — L720 Epidermal cyst: Secondary | ICD-10-CM | POA: Diagnosis not present

## 2015-08-28 DIAGNOSIS — Z85828 Personal history of other malignant neoplasm of skin: Secondary | ICD-10-CM | POA: Diagnosis not present

## 2015-08-28 DIAGNOSIS — L570 Actinic keratosis: Secondary | ICD-10-CM | POA: Diagnosis not present

## 2015-08-28 DIAGNOSIS — L821 Other seborrheic keratosis: Secondary | ICD-10-CM | POA: Diagnosis not present

## 2015-08-30 ENCOUNTER — Encounter: Payer: Federal, State, Local not specified - PPO | Admitting: Vascular Surgery

## 2015-09-02 DIAGNOSIS — D649 Anemia, unspecified: Secondary | ICD-10-CM | POA: Diagnosis not present

## 2015-09-02 DIAGNOSIS — I779 Disorder of arteries and arterioles, unspecified: Secondary | ICD-10-CM | POA: Diagnosis not present

## 2015-09-02 DIAGNOSIS — I10 Essential (primary) hypertension: Secondary | ICD-10-CM | POA: Diagnosis not present

## 2015-09-02 DIAGNOSIS — F329 Major depressive disorder, single episode, unspecified: Secondary | ICD-10-CM | POA: Diagnosis not present

## 2015-09-02 DIAGNOSIS — R7301 Impaired fasting glucose: Secondary | ICD-10-CM | POA: Diagnosis not present

## 2015-09-02 DIAGNOSIS — D126 Benign neoplasm of colon, unspecified: Secondary | ICD-10-CM | POA: Diagnosis not present

## 2015-09-02 DIAGNOSIS — Z1389 Encounter for screening for other disorder: Secondary | ICD-10-CM | POA: Diagnosis not present

## 2015-09-02 DIAGNOSIS — I701 Atherosclerosis of renal artery: Secondary | ICD-10-CM | POA: Diagnosis not present

## 2015-09-02 DIAGNOSIS — Z23 Encounter for immunization: Secondary | ICD-10-CM | POA: Diagnosis not present

## 2015-09-02 DIAGNOSIS — M810 Age-related osteoporosis without current pathological fracture: Secondary | ICD-10-CM | POA: Diagnosis not present

## 2015-09-02 DIAGNOSIS — Z0000 Encounter for general adult medical examination without abnormal findings: Secondary | ICD-10-CM | POA: Diagnosis not present

## 2015-09-18 DIAGNOSIS — Z1212 Encounter for screening for malignant neoplasm of rectum: Secondary | ICD-10-CM | POA: Diagnosis not present

## 2015-10-29 DIAGNOSIS — Z961 Presence of intraocular lens: Secondary | ICD-10-CM | POA: Diagnosis not present

## 2015-10-29 DIAGNOSIS — I10 Essential (primary) hypertension: Secondary | ICD-10-CM | POA: Diagnosis not present

## 2015-10-29 DIAGNOSIS — H04121 Dry eye syndrome of right lacrimal gland: Secondary | ICD-10-CM | POA: Diagnosis not present

## 2015-10-29 DIAGNOSIS — H02839 Dermatochalasis of unspecified eye, unspecified eyelid: Secondary | ICD-10-CM | POA: Diagnosis not present

## 2015-12-30 DIAGNOSIS — H53483 Generalized contraction of visual field, bilateral: Secondary | ICD-10-CM | POA: Diagnosis not present

## 2015-12-30 DIAGNOSIS — H11433 Conjunctival hyperemia, bilateral: Secondary | ICD-10-CM | POA: Diagnosis not present

## 2015-12-30 DIAGNOSIS — H02831 Dermatochalasis of right upper eyelid: Secondary | ICD-10-CM | POA: Diagnosis not present

## 2015-12-30 DIAGNOSIS — H02834 Dermatochalasis of left upper eyelid: Secondary | ICD-10-CM | POA: Diagnosis not present

## 2016-01-01 HISTORY — PX: RECONSTRUCTION OF EYELID: SHX6576

## 2016-01-02 DIAGNOSIS — J209 Acute bronchitis, unspecified: Secondary | ICD-10-CM | POA: Diagnosis not present

## 2016-01-02 DIAGNOSIS — Z681 Body mass index (BMI) 19 or less, adult: Secondary | ICD-10-CM | POA: Diagnosis not present

## 2016-01-02 DIAGNOSIS — R05 Cough: Secondary | ICD-10-CM | POA: Diagnosis not present

## 2016-01-02 DIAGNOSIS — R062 Wheezing: Secondary | ICD-10-CM | POA: Diagnosis not present

## 2016-02-20 DIAGNOSIS — G459 Transient cerebral ischemic attack, unspecified: Secondary | ICD-10-CM | POA: Diagnosis not present

## 2016-02-20 DIAGNOSIS — I10 Essential (primary) hypertension: Secondary | ICD-10-CM | POA: Diagnosis not present

## 2016-02-20 DIAGNOSIS — I779 Disorder of arteries and arterioles, unspecified: Secondary | ICD-10-CM | POA: Diagnosis not present

## 2016-02-20 DIAGNOSIS — M810 Age-related osteoporosis without current pathological fracture: Secondary | ICD-10-CM | POA: Diagnosis not present

## 2016-02-20 DIAGNOSIS — Z1389 Encounter for screening for other disorder: Secondary | ICD-10-CM | POA: Diagnosis not present

## 2016-02-20 DIAGNOSIS — R7301 Impaired fasting glucose: Secondary | ICD-10-CM | POA: Diagnosis not present

## 2016-02-20 DIAGNOSIS — I701 Atherosclerosis of renal artery: Secondary | ICD-10-CM | POA: Diagnosis not present

## 2016-02-20 DIAGNOSIS — D126 Benign neoplasm of colon, unspecified: Secondary | ICD-10-CM | POA: Diagnosis not present

## 2016-02-20 DIAGNOSIS — E784 Other hyperlipidemia: Secondary | ICD-10-CM | POA: Diagnosis not present

## 2016-03-02 DIAGNOSIS — H02834 Dermatochalasis of left upper eyelid: Secondary | ICD-10-CM | POA: Diagnosis not present

## 2016-03-02 DIAGNOSIS — H02132 Senile ectropion of right lower eyelid: Secondary | ICD-10-CM | POA: Diagnosis not present

## 2016-03-02 DIAGNOSIS — H02102 Unspecified ectropion of right lower eyelid: Secondary | ICD-10-CM | POA: Diagnosis not present

## 2016-03-02 DIAGNOSIS — H02831 Dermatochalasis of right upper eyelid: Secondary | ICD-10-CM | POA: Diagnosis not present

## 2016-03-02 DIAGNOSIS — H0289 Other specified disorders of eyelid: Secondary | ICD-10-CM | POA: Diagnosis not present

## 2016-03-02 DIAGNOSIS — H02135 Senile ectropion of left lower eyelid: Secondary | ICD-10-CM | POA: Diagnosis not present

## 2016-03-02 DIAGNOSIS — H02105 Unspecified ectropion of left lower eyelid: Secondary | ICD-10-CM | POA: Diagnosis not present

## 2016-03-26 DIAGNOSIS — Z681 Body mass index (BMI) 19 or less, adult: Secondary | ICD-10-CM | POA: Diagnosis not present

## 2016-03-26 DIAGNOSIS — M5432 Sciatica, left side: Secondary | ICD-10-CM | POA: Diagnosis not present

## 2016-05-01 DIAGNOSIS — Z85828 Personal history of other malignant neoplasm of skin: Secondary | ICD-10-CM | POA: Diagnosis not present

## 2016-05-01 DIAGNOSIS — L821 Other seborrheic keratosis: Secondary | ICD-10-CM | POA: Diagnosis not present

## 2016-05-01 DIAGNOSIS — L570 Actinic keratosis: Secondary | ICD-10-CM | POA: Diagnosis not present

## 2016-05-04 DIAGNOSIS — Z1231 Encounter for screening mammogram for malignant neoplasm of breast: Secondary | ICD-10-CM | POA: Diagnosis not present

## 2016-05-18 ENCOUNTER — Ambulatory Visit (INDEPENDENT_AMBULATORY_CARE_PROVIDER_SITE_OTHER): Payer: Medicare Other | Admitting: Nurse Practitioner

## 2016-05-18 ENCOUNTER — Encounter: Payer: Self-pay | Admitting: Nurse Practitioner

## 2016-05-18 VITALS — BP 124/74 | HR 72 | Resp 16 | Ht 61.25 in | Wt 97.0 lb

## 2016-05-18 DIAGNOSIS — Z0000 Encounter for general adult medical examination without abnormal findings: Secondary | ICD-10-CM

## 2016-05-18 DIAGNOSIS — Z01419 Encounter for gynecological examination (general) (routine) without abnormal findings: Secondary | ICD-10-CM

## 2016-05-18 LAB — POCT URINALYSIS DIPSTICK
BILIRUBIN UA: NEGATIVE
Blood, UA: NEGATIVE
GLUCOSE UA: NEGATIVE
KETONES UA: NEGATIVE
Leukocytes, UA: NEGATIVE
Nitrite, UA: NEGATIVE
Protein, UA: NEGATIVE
UROBILINOGEN UA: NEGATIVE
pH, UA: 6

## 2016-05-18 MED ORDER — ESTRADIOL 0.1 MG/GM VA CREA: 1.0000 | TOPICAL_CREAM | VAGINAL | Status: DC

## 2016-05-18 NOTE — Progress Notes (Signed)
Encounter reviewed Jill Jertson, MD   

## 2016-05-18 NOTE — Progress Notes (Signed)
82 y.o. G59P1001 Widowed  Caucasian Fe here for annual exam.  Doing well except for a BP spike that sent her to ED.  She had a multitude of test and PCP did a 24 hour urine test.  No evidence of TIA.   She was upset over making a decision about upgrade in her kitchen and how that would displace her and pet dog.  She has decided not to do the upgrade and wait until she sold the house and allow the price to reflect that someone else could do the upgrade.  Now that she has decided that she is OK and no BP spikes.  Patient's last menstrual period was 11/30/1976.          Sexually active: Yes.    The current method of family planning is status post hysterectomy.    Exercising: Yes.    Walking Smoker:  no  Health Maintenance: Pap:  03/29/2000 Normal  MMG:  05/02/15 BIRADS2:Benign repeat done 2 weeks ago Colonoscopy:  2008 Dr. Earlean Shawl not due secondary to age - IFOB is done with PCP BMD:   07/2011 T Score -1.9 S/-1.3 R/-0.9 L TDaP:  ~2011 with PCP Shingles: done with PCP Pneumonia: done with PCP, Prevnar 13 is pending Hep C and HIV: not done due to age Labs: PCP [id number]   reports that she has quit smoking. Her smoking use included Cigarettes. She has a 10 pack-year smoking history. She has never used smokeless tobacco. She reports that she drinks about 0.6 - 1.8 oz of alcohol per week. She reports that she does not use illicit drugs.  Past Medical History  Diagnosis Date  . Hypertension   . Bilateral renal artery stenosis ([place])   . Hypertriglyceridemia   . Anemia   . GERD (gastroesophageal reflux disease)   . Headache     "probably weekly" (06/27/2015)  . Migraine     "maybe monthly" (06/27/2015)  . Arthritis     "thumbs" (06/27/2015)  . Chronic lower back pain     Past Surgical History  Procedure Laterality Date  . Balloon dilation  1980's?    "for renal stenosis"  . Toe surgery Bilateral     "straightened big toe"  . Vaginal hysterectomy  1973  . Tonsillectomy  ~ 1941  .  Reconstruction of eyelid Bilateral 01/2016    Upper and lower eyelid    Current Outpatient Prescriptions  Medication Sig Dispense Refill  . amLODipine (NORVASC) 10 MG tablet Take 1 tablet (10 mg total) by mouth daily. 30 tablet 0  . clopidogrel (PLAVIX) 75 MG tablet Take 1 tablet (75 mg total) by mouth daily. 30 tablet 0  . estradiol (ESTRACE) 0.1 MG/GM vaginal cream Place 1 Applicatorful vaginally once a week. 1/4 -1/2 applicator once a week or just at the urethra 42.5 g 1  . fenofibrate 160 MG tablet Take 160 mg by mouth daily.    Marland Kitchen HYDROcodone-acetaminophen (NORCO/VICODIN) 5-325 MG tablet Take 1 tablet by mouth every 6 (six) hours as needed. for pain  0  . mirtazapine (REMERON) 15 MG tablet Take 15 mg by mouth at bedtime.    . montelukast (SINGULAIR) 10 MG tablet Take 10 mg by mouth daily.  6  . omeprazole (PRILOSEC) 20 MG capsule TAKE 1 CAPSULE IN THE EVENING  5  . propranolol ER (INDERAL LA) 120 MG 24 hr capsule Take 120 mg by mouth daily.  3  . quinapril (ACCUPRIL) 20 MG tablet Take 20 mg by mouth at bedtime.    Marland Kitchen  Vitamin D, Ergocalciferol, (DRISDOL) 50000 UNITS CAPS capsule Take 1 capsule by mouth once a week.     No current facility-administered medications for this visit.    Family History  Problem Relation Age of Onset  . Hypertension Brother   . Hyperlipidemia Brother   . Parkinsonism Father   . Stroke Maternal Grandfather     ROS:  Pertinent items are noted in HPI.  Otherwise, a comprehensive ROS was negative.  Exam:   BP 124/74 mmHg  Pulse 72  Resp 16  Ht 5' 1.25" (1.556 m)  Wt 97 lb (43.999 kg)  BMI 18.17 kg/m2  LMP 11/30/1976 Height: 5' 1.25" (155.6 cm) Ht Readings from Last 3 Encounters:  05/18/16 5' 1.25" (1.556 m)  06/27/15 5\' 2"  (1.575 m)  05/14/15 5' 1.5" (1.562 m)    General appearance: alert, cooperative and appears stated age Head: Normocephalic, without obvious abnormality, atraumatic Neck: no adenopathy, supple, symmetrical, trachea midline and  thyroid normal to inspection and palpation Lungs: clear to auscultation bilaterally Breasts: normal appearance, no masses or tenderness Heart: regular rate and rhythm Abdomen: soft, non-tender; no masses,  no organomegaly Extremities: extremities normal, atraumatic, no cyanosis or edema Skin: Skin color, texture, turgor normal. No rashes or lesions Lymph nodes: Cervical, supraclavicular, and axillary nodes normal. No abnormal inguinal nodes palpated Neurologic: Grossly normal   Pelvic: External genitalia:  no lesions              Urethra:  normal appearing urethra with no masses, tenderness or lesions              Bartholin's and Skene's: normal                 Vagina: normal appearing vagina with normal color and discharge, no lesions              Cervix: absent              Pap taken: No. Bimanual Exam:  Uterus:  uterus absent              Adnexa: no mass, fullness, tenderness               Rectovaginal: Confirms               Anus:  normal sphincter tone, no lesions  Chaperone present: no  A:  Well Woman with normal exam  Postmenopausal off ERT 1993- 2004 S/P TVH 1973 Atrophic vaginitis/ urethritis - rare use of vaginal estrogen cream HTN with renal stenosis Osteoporosis - off Fosamax - PCP is following   P:   Reviewed health and wellness pertinent to exam  Pap smear as above  Mammogram is due 04/2017- - ROI for current Mammo 2 weeks ago  Refill on Estrace vaginal cream pea size to the urethra only prn - rare use @ 1 tube per year  Counseled with risk of DVT, CVA, cancer, etc  Counseled on breast self exam, mammography screening, use and side effects of ERT, adequate intake of calcium and vitamin D, diet and exercise return annually or prn  An After Visit Summary was printed and given to the patient.

## 2016-05-18 NOTE — Patient Instructions (Signed)

## 2016-06-30 DIAGNOSIS — Z681 Body mass index (BMI) 19 or less, adult: Secondary | ICD-10-CM | POA: Diagnosis not present

## 2016-06-30 DIAGNOSIS — M810 Age-related osteoporosis without current pathological fracture: Secondary | ICD-10-CM | POA: Diagnosis not present

## 2016-06-30 DIAGNOSIS — I779 Disorder of arteries and arterioles, unspecified: Secondary | ICD-10-CM | POA: Diagnosis not present

## 2016-06-30 DIAGNOSIS — R7301 Impaired fasting glucose: Secondary | ICD-10-CM | POA: Diagnosis not present

## 2016-06-30 DIAGNOSIS — G458 Other transient cerebral ischemic attacks and related syndromes: Secondary | ICD-10-CM | POA: Diagnosis not present

## 2016-06-30 DIAGNOSIS — I10 Essential (primary) hypertension: Secondary | ICD-10-CM | POA: Diagnosis not present

## 2016-06-30 DIAGNOSIS — J208 Acute bronchitis due to other specified organisms: Secondary | ICD-10-CM | POA: Diagnosis not present

## 2016-09-09 DIAGNOSIS — E784 Other hyperlipidemia: Secondary | ICD-10-CM | POA: Diagnosis not present

## 2016-09-09 DIAGNOSIS — R7301 Impaired fasting glucose: Secondary | ICD-10-CM | POA: Diagnosis not present

## 2016-09-09 DIAGNOSIS — M810 Age-related osteoporosis without current pathological fracture: Secondary | ICD-10-CM | POA: Diagnosis not present

## 2016-09-09 DIAGNOSIS — I152 Hypertension secondary to endocrine disorders: Secondary | ICD-10-CM | POA: Diagnosis not present

## 2016-09-16 DIAGNOSIS — M810 Age-related osteoporosis without current pathological fracture: Secondary | ICD-10-CM | POA: Diagnosis not present

## 2016-09-16 DIAGNOSIS — Z23 Encounter for immunization: Secondary | ICD-10-CM | POA: Diagnosis not present

## 2016-09-16 DIAGNOSIS — Z1389 Encounter for screening for other disorder: Secondary | ICD-10-CM | POA: Diagnosis not present

## 2016-09-16 DIAGNOSIS — I10 Essential (primary) hypertension: Secondary | ICD-10-CM | POA: Diagnosis not present

## 2016-09-16 DIAGNOSIS — D6489 Other specified anemias: Secondary | ICD-10-CM | POA: Diagnosis not present

## 2016-09-16 DIAGNOSIS — R7301 Impaired fasting glucose: Secondary | ICD-10-CM | POA: Diagnosis not present

## 2016-09-16 DIAGNOSIS — E784 Other hyperlipidemia: Secondary | ICD-10-CM | POA: Diagnosis not present

## 2016-09-16 DIAGNOSIS — F329 Major depressive disorder, single episode, unspecified: Secondary | ICD-10-CM | POA: Diagnosis not present

## 2016-09-16 DIAGNOSIS — Z0000 Encounter for general adult medical examination without abnormal findings: Secondary | ICD-10-CM | POA: Diagnosis not present

## 2016-09-16 DIAGNOSIS — I7789 Other specified disorders of arteries and arterioles: Secondary | ICD-10-CM | POA: Diagnosis not present

## 2016-10-14 DIAGNOSIS — D509 Iron deficiency anemia, unspecified: Secondary | ICD-10-CM | POA: Diagnosis not present

## 2016-11-03 DIAGNOSIS — I10 Essential (primary) hypertension: Secondary | ICD-10-CM | POA: Diagnosis not present

## 2016-11-03 DIAGNOSIS — Z961 Presence of intraocular lens: Secondary | ICD-10-CM | POA: Diagnosis not present

## 2016-11-03 DIAGNOSIS — H04121 Dry eye syndrome of right lacrimal gland: Secondary | ICD-10-CM | POA: Diagnosis not present

## 2016-11-03 DIAGNOSIS — H02839 Dermatochalasis of unspecified eye, unspecified eyelid: Secondary | ICD-10-CM | POA: Diagnosis not present

## 2016-11-11 DIAGNOSIS — Z5181 Encounter for therapeutic drug level monitoring: Secondary | ICD-10-CM | POA: Diagnosis not present

## 2016-11-12 DIAGNOSIS — K641 Second degree hemorrhoids: Secondary | ICD-10-CM | POA: Diagnosis not present

## 2016-11-12 DIAGNOSIS — K5520 Angiodysplasia of colon without hemorrhage: Secondary | ICD-10-CM | POA: Diagnosis not present

## 2016-11-12 DIAGNOSIS — D509 Iron deficiency anemia, unspecified: Secondary | ICD-10-CM | POA: Diagnosis not present

## 2016-11-12 DIAGNOSIS — K5730 Diverticulosis of large intestine without perforation or abscess without bleeding: Secondary | ICD-10-CM | POA: Diagnosis not present

## 2016-11-12 DIAGNOSIS — Z86010 Personal history of colonic polyps: Secondary | ICD-10-CM | POA: Diagnosis not present

## 2016-11-12 DIAGNOSIS — K648 Other hemorrhoids: Secondary | ICD-10-CM | POA: Diagnosis not present

## 2016-11-17 DIAGNOSIS — D509 Iron deficiency anemia, unspecified: Secondary | ICD-10-CM | POA: Diagnosis not present

## 2016-12-10 ENCOUNTER — Other Ambulatory Visit (HOSPITAL_COMMUNITY): Payer: Self-pay | Admitting: *Deleted

## 2016-12-11 ENCOUNTER — Encounter (HOSPITAL_COMMUNITY)
Admission: RE | Admit: 2016-12-11 | Discharge: 2016-12-11 | Disposition: A | Discharge status: Home / Self Care | Payer: Medicare Other | Source: Ambulatory Visit | Attending: Endocrinology | Admitting: Endocrinology

## 2016-12-11 DIAGNOSIS — D649 Anemia, unspecified: Secondary | ICD-10-CM | POA: Insufficient documentation

## 2016-12-11 MED ORDER — SODIUM CHLORIDE 0.9 % IV SOLN
510.0000 mg | INTRAVENOUS | Status: DC
  Administered 2016-12-11: 510 mg via INTRAVENOUS
  Filled 2016-12-11: qty 17

## 2016-12-11 NOTE — Discharge Instructions (Signed)
Ferumoxytol injection What is this medicine? FERUMOXYTOL is an iron complex. Iron is used to make healthy red blood cells, which carry oxygen and nutrients throughout the body. This medicine is used to treat iron deficiency anemia in people with chronic kidney disease. COMMON BRAND NAME(S): Feraheme What should I tell my health care provider before I take this medicine? They need to know if you have any of these conditions: -anemia not caused by low iron levels -high levels of iron in the blood -magnetic resonance imaging (MRI) test scheduled -an unusual or allergic reaction to iron, other medicines, foods, dyes, or preservatives -pregnant or trying to get pregnant -breast-feeding How should I use this medicine? This medicine is for injection into a vein. It is given by a health care professional in a hospital or clinic setting. Talk to your pediatrician regarding the use of this medicine in children. Special care may be needed. What if I miss a dose? It is important not to miss your dose. Call your doctor or health care professional if you are unable to keep an appointment. What may interact with this medicine? This medicine may interact with the following medications: -other iron products What should I watch for while using this medicine? Visit your doctor or healthcare professional regularly. Tell your doctor or healthcare professional if your symptoms do not start to get better or if they get worse. You may need blood work done while you are taking this medicine. You may need to follow a special diet. Talk to your doctor. Foods that contain iron include: whole grains/cereals, dried fruits, beans, or peas, leafy green vegetables, and organ meats (liver, kidney). What side effects may I notice from receiving this medicine? Side effects that you should report to your doctor or health care professional as soon as possible: -allergic reactions like skin rash, itching or hives, swelling of the  face, lips, or tongue -breathing problems -changes in blood pressure -feeling faint or lightheaded, falls -fever or chills -flushing, sweating, or hot feelings -swelling of the ankles or feet Side effects that usually do not require medical attention (report to your doctor or health care professional if they continue or are bothersome): -diarrhea -headache -nausea, vomiting -stomach pain Where should I keep my medicine? This drug is given in a hospital or clinic and will not be stored at home.  2017 Elsevier/Gold Standard (2015-12-19 12:41:49)  

## 2016-12-18 ENCOUNTER — Encounter (HOSPITAL_COMMUNITY)
Admission: RE | Admit: 2016-12-18 | Discharge: 2016-12-18 | Disposition: A | Discharge status: Home / Self Care | Payer: Medicare Other | Source: Ambulatory Visit | Attending: Endocrinology | Admitting: Endocrinology

## 2016-12-18 DIAGNOSIS — D649 Anemia, unspecified: Secondary | ICD-10-CM | POA: Diagnosis not present

## 2016-12-18 MED ORDER — SODIUM CHLORIDE 0.9 % IV SOLN
510.0000 mg | INTRAVENOUS | Status: AC
  Administered 2016-12-18: 510 mg via INTRAVENOUS
  Filled 2016-12-18: qty 17

## 2016-12-24 DIAGNOSIS — L821 Other seborrheic keratosis: Secondary | ICD-10-CM | POA: Diagnosis not present

## 2016-12-24 DIAGNOSIS — D3611 Benign neoplasm of peripheral nerves and autonomic nervous system of face, head, and neck: Secondary | ICD-10-CM | POA: Diagnosis not present

## 2016-12-24 DIAGNOSIS — D485 Neoplasm of uncertain behavior of skin: Secondary | ICD-10-CM | POA: Diagnosis not present

## 2016-12-24 DIAGNOSIS — L570 Actinic keratosis: Secondary | ICD-10-CM | POA: Diagnosis not present

## 2016-12-24 DIAGNOSIS — D1801 Hemangioma of skin and subcutaneous tissue: Secondary | ICD-10-CM | POA: Diagnosis not present

## 2016-12-24 DIAGNOSIS — D2262 Melanocytic nevi of left upper limb, including shoulder: Secondary | ICD-10-CM | POA: Diagnosis not present

## 2016-12-24 DIAGNOSIS — L814 Other melanin hyperpigmentation: Secondary | ICD-10-CM | POA: Diagnosis not present

## 2016-12-24 DIAGNOSIS — Z85828 Personal history of other malignant neoplasm of skin: Secondary | ICD-10-CM | POA: Diagnosis not present

## 2016-12-28 ENCOUNTER — Other Ambulatory Visit: Payer: Self-pay | Admitting: Nurse Practitioner

## 2017-01-18 DIAGNOSIS — M810 Age-related osteoporosis without current pathological fracture: Secondary | ICD-10-CM | POA: Diagnosis not present

## 2017-01-18 DIAGNOSIS — I779 Disorder of arteries and arterioles, unspecified: Secondary | ICD-10-CM | POA: Diagnosis not present

## 2017-01-18 DIAGNOSIS — D649 Anemia, unspecified: Secondary | ICD-10-CM | POA: Diagnosis not present

## 2017-01-18 DIAGNOSIS — R7301 Impaired fasting glucose: Secondary | ICD-10-CM | POA: Diagnosis not present

## 2017-01-18 DIAGNOSIS — F329 Major depressive disorder, single episode, unspecified: Secondary | ICD-10-CM | POA: Diagnosis not present

## 2017-01-18 DIAGNOSIS — I10 Essential (primary) hypertension: Secondary | ICD-10-CM | POA: Diagnosis not present

## 2017-01-18 DIAGNOSIS — R209 Unspecified disturbances of skin sensation: Secondary | ICD-10-CM | POA: Diagnosis not present

## 2017-01-18 DIAGNOSIS — E784 Other hyperlipidemia: Secondary | ICD-10-CM | POA: Diagnosis not present

## 2017-01-29 DIAGNOSIS — L237 Allergic contact dermatitis due to plants, except food: Secondary | ICD-10-CM | POA: Diagnosis not present

## 2017-03-30 DIAGNOSIS — M1990 Unspecified osteoarthritis, unspecified site: Secondary | ICD-10-CM | POA: Diagnosis not present

## 2017-03-30 DIAGNOSIS — Z681 Body mass index (BMI) 19 or less, adult: Secondary | ICD-10-CM | POA: Diagnosis not present

## 2017-03-30 DIAGNOSIS — M76899 Other specified enthesopathies of unspecified lower limb, excluding foot: Secondary | ICD-10-CM | POA: Diagnosis not present

## 2017-04-09 DIAGNOSIS — M25552 Pain in left hip: Secondary | ICD-10-CM | POA: Diagnosis not present

## 2017-04-19 DIAGNOSIS — S76312A Strain of muscle, fascia and tendon of the posterior muscle group at thigh level, left thigh, initial encounter: Secondary | ICD-10-CM | POA: Diagnosis not present

## 2017-04-24 DIAGNOSIS — S76312A Strain of muscle, fascia and tendon of the posterior muscle group at thigh level, left thigh, initial encounter: Secondary | ICD-10-CM | POA: Diagnosis not present

## 2017-04-29 DIAGNOSIS — S76012A Strain of muscle, fascia and tendon of left hip, initial encounter: Secondary | ICD-10-CM | POA: Diagnosis not present

## 2017-04-29 DIAGNOSIS — S76312D Strain of muscle, fascia and tendon of the posterior muscle group at thigh level, left thigh, subsequent encounter: Secondary | ICD-10-CM | POA: Diagnosis not present

## 2017-05-10 DIAGNOSIS — I10 Essential (primary) hypertension: Secondary | ICD-10-CM | POA: Diagnosis not present

## 2017-05-10 DIAGNOSIS — M76899 Other specified enthesopathies of unspecified lower limb, excluding foot: Secondary | ICD-10-CM | POA: Diagnosis not present

## 2017-05-10 DIAGNOSIS — F329 Major depressive disorder, single episode, unspecified: Secondary | ICD-10-CM | POA: Diagnosis not present

## 2017-05-10 DIAGNOSIS — Z681 Body mass index (BMI) 19 or less, adult: Secondary | ICD-10-CM | POA: Diagnosis not present

## 2017-05-10 DIAGNOSIS — R7301 Impaired fasting glucose: Secondary | ICD-10-CM | POA: Diagnosis not present

## 2017-05-11 DIAGNOSIS — M79605 Pain in left leg: Secondary | ICD-10-CM | POA: Diagnosis not present

## 2017-05-17 DIAGNOSIS — S76312D Strain of muscle, fascia and tendon of the posterior muscle group at thigh level, left thigh, subsequent encounter: Secondary | ICD-10-CM | POA: Diagnosis not present

## 2017-05-17 DIAGNOSIS — S76319D Strain of muscle, fascia and tendon of the posterior muscle group at thigh level, unspecified thigh, subsequent encounter: Secondary | ICD-10-CM | POA: Diagnosis not present

## 2017-05-20 DIAGNOSIS — M79605 Pain in left leg: Secondary | ICD-10-CM | POA: Diagnosis not present

## 2017-05-21 ENCOUNTER — Ambulatory Visit: Payer: Medicare Other | Admitting: Nurse Practitioner

## 2017-05-27 ENCOUNTER — Other Ambulatory Visit: Payer: Self-pay | Admitting: Orthopedic Surgery

## 2017-05-27 DIAGNOSIS — S76319D Strain of muscle, fascia and tendon of the posterior muscle group at thigh level, unspecified thigh, subsequent encounter: Secondary | ICD-10-CM

## 2017-05-28 DIAGNOSIS — S76312D Strain of muscle, fascia and tendon of the posterior muscle group at thigh level, left thigh, subsequent encounter: Secondary | ICD-10-CM | POA: Diagnosis not present

## 2017-06-04 DIAGNOSIS — S76312D Strain of muscle, fascia and tendon of the posterior muscle group at thigh level, left thigh, subsequent encounter: Secondary | ICD-10-CM | POA: Diagnosis not present

## 2017-06-04 DIAGNOSIS — M47816 Spondylosis without myelopathy or radiculopathy, lumbar region: Secondary | ICD-10-CM | POA: Diagnosis not present

## 2017-06-08 ENCOUNTER — Other Ambulatory Visit: Payer: Federal, State, Local not specified - PPO

## 2017-06-11 DIAGNOSIS — M5432 Sciatica, left side: Secondary | ICD-10-CM | POA: Diagnosis not present

## 2017-06-11 DIAGNOSIS — I10 Essential (primary) hypertension: Secondary | ICD-10-CM | POA: Diagnosis not present

## 2017-06-11 DIAGNOSIS — D126 Benign neoplasm of colon, unspecified: Secondary | ICD-10-CM | POA: Diagnosis not present

## 2017-06-11 DIAGNOSIS — F329 Major depressive disorder, single episode, unspecified: Secondary | ICD-10-CM | POA: Diagnosis not present

## 2017-06-11 DIAGNOSIS — R7301 Impaired fasting glucose: Secondary | ICD-10-CM | POA: Diagnosis not present

## 2017-06-11 DIAGNOSIS — Z681 Body mass index (BMI) 19 or less, adult: Secondary | ICD-10-CM | POA: Diagnosis not present

## 2017-06-11 DIAGNOSIS — Z1389 Encounter for screening for other disorder: Secondary | ICD-10-CM | POA: Diagnosis not present

## 2017-06-11 DIAGNOSIS — E785 Hyperlipidemia, unspecified: Secondary | ICD-10-CM | POA: Diagnosis not present

## 2017-06-11 DIAGNOSIS — M810 Age-related osteoporosis without current pathological fracture: Secondary | ICD-10-CM | POA: Diagnosis not present

## 2017-06-17 DIAGNOSIS — M48062 Spinal stenosis, lumbar region with neurogenic claudication: Secondary | ICD-10-CM | POA: Diagnosis not present

## 2017-06-17 DIAGNOSIS — M47816 Spondylosis without myelopathy or radiculopathy, lumbar region: Secondary | ICD-10-CM | POA: Diagnosis not present

## 2017-06-17 DIAGNOSIS — M5416 Radiculopathy, lumbar region: Secondary | ICD-10-CM | POA: Diagnosis not present

## 2017-06-25 ENCOUNTER — Other Ambulatory Visit: Payer: Self-pay | Admitting: Endocrinology

## 2017-06-25 DIAGNOSIS — I701 Atherosclerosis of renal artery: Secondary | ICD-10-CM

## 2017-06-25 DIAGNOSIS — I10 Essential (primary) hypertension: Secondary | ICD-10-CM

## 2017-06-29 ENCOUNTER — Ambulatory Visit
Admission: RE | Admit: 2017-06-29 | Discharge: 2017-06-29 | Disposition: A | Discharge status: Home / Self Care | Payer: Self-pay | Source: Ambulatory Visit | Attending: Endocrinology | Admitting: Endocrinology

## 2017-06-29 ENCOUNTER — Ambulatory Visit
Admission: RE | Admit: 2017-06-29 | Discharge: 2017-06-29 | Disposition: A | Discharge status: Home / Self Care | Payer: Medicare Other | Source: Ambulatory Visit | Attending: Endocrinology | Admitting: Endocrinology

## 2017-06-29 ENCOUNTER — Other Ambulatory Visit: Payer: Federal, State, Local not specified - PPO

## 2017-06-29 ENCOUNTER — Other Ambulatory Visit: Payer: Self-pay | Admitting: Endocrinology

## 2017-06-29 DIAGNOSIS — I701 Atherosclerosis of renal artery: Secondary | ICD-10-CM

## 2017-06-29 DIAGNOSIS — N281 Cyst of kidney, acquired: Secondary | ICD-10-CM | POA: Diagnosis not present

## 2017-06-29 DIAGNOSIS — I10 Essential (primary) hypertension: Secondary | ICD-10-CM

## 2017-07-15 DIAGNOSIS — M47816 Spondylosis without myelopathy or radiculopathy, lumbar region: Secondary | ICD-10-CM | POA: Diagnosis not present

## 2017-07-15 DIAGNOSIS — M48062 Spinal stenosis, lumbar region with neurogenic claudication: Secondary | ICD-10-CM | POA: Diagnosis not present

## 2017-07-15 DIAGNOSIS — M5416 Radiculopathy, lumbar region: Secondary | ICD-10-CM | POA: Diagnosis not present

## 2017-07-20 ENCOUNTER — Other Ambulatory Visit: Payer: Self-pay | Admitting: *Deleted

## 2017-07-20 MED ORDER — ESTRADIOL 0.1 MG/GM VA CREA: TOPICAL_CREAM | VAGINAL | 0 refills | Status: DC

## 2017-07-20 NOTE — Telephone Encounter (Signed)
Faxed refill request received from [facility] for ESTRACE CREAM Last filled by [facility] on 05/18/16, 42.5 gm with 1 RF Last AEX - 05/18/16 [facility] Next AEX - not scheduled, pt cancelled Last MMG - 05/04/16, Bi-Rads 2:  Benign, repeat in one year. Per Lannette Donath at [facility], patient has not had MMG in 2018 and is not scheduled.  I spoke to the patient to advise she is due for annual exam and mammogram. Patient states she has had some health issues this year and now has a pulled muscle in her leg and has not been up to having physical exam.  Patient is scheduled for annual exam on 10/07/17 @ 2:30 with Dr. Talbert Nan. Patient will call [facility] for appointment for mammogram.  Please advise refills. Thank you.

## 2017-07-20 NOTE — Telephone Encounter (Signed)
Patient notified RX has been sent to pharmacy. Patient appreciative of call.

## 2017-07-21 DIAGNOSIS — Z1231 Encounter for screening mammogram for malignant neoplasm of breast: Secondary | ICD-10-CM | POA: Diagnosis not present

## 2017-09-22 DIAGNOSIS — Z681 Body mass index (BMI) 19 or less, adult: Secondary | ICD-10-CM | POA: Diagnosis not present

## 2017-09-22 DIAGNOSIS — I10 Essential (primary) hypertension: Secondary | ICD-10-CM | POA: Diagnosis not present

## 2017-09-22 DIAGNOSIS — R7301 Impaired fasting glucose: Secondary | ICD-10-CM | POA: Diagnosis not present

## 2017-09-22 DIAGNOSIS — D649 Anemia, unspecified: Secondary | ICD-10-CM | POA: Diagnosis not present

## 2017-09-22 DIAGNOSIS — I779 Disorder of arteries and arterioles, unspecified: Secondary | ICD-10-CM | POA: Diagnosis not present

## 2017-09-22 DIAGNOSIS — I701 Atherosclerosis of renal artery: Secondary | ICD-10-CM | POA: Diagnosis not present

## 2017-09-22 DIAGNOSIS — Z1389 Encounter for screening for other disorder: Secondary | ICD-10-CM | POA: Diagnosis not present

## 2017-09-22 DIAGNOSIS — R05 Cough: Secondary | ICD-10-CM | POA: Diagnosis not present

## 2017-09-22 DIAGNOSIS — M810 Age-related osteoporosis without current pathological fracture: Secondary | ICD-10-CM | POA: Diagnosis not present

## 2017-09-24 DIAGNOSIS — Z23 Encounter for immunization: Secondary | ICD-10-CM | POA: Diagnosis not present

## 2017-10-07 ENCOUNTER — Ambulatory Visit: Payer: Self-pay | Admitting: Obstetrics and Gynecology

## 2017-10-11 DIAGNOSIS — K921 Melena: Secondary | ICD-10-CM | POA: Diagnosis not present

## 2017-10-11 DIAGNOSIS — R05 Cough: Secondary | ICD-10-CM | POA: Diagnosis not present

## 2017-10-11 DIAGNOSIS — J069 Acute upper respiratory infection, unspecified: Secondary | ICD-10-CM | POA: Diagnosis not present

## 2017-10-11 DIAGNOSIS — H6121 Impacted cerumen, right ear: Secondary | ICD-10-CM | POA: Diagnosis not present

## 2017-10-11 DIAGNOSIS — R197 Diarrhea, unspecified: Secondary | ICD-10-CM | POA: Diagnosis not present

## 2017-10-18 DIAGNOSIS — M47816 Spondylosis without myelopathy or radiculopathy, lumbar region: Secondary | ICD-10-CM | POA: Diagnosis not present

## 2017-11-02 ENCOUNTER — Encounter: Payer: Self-pay | Admitting: Certified Nurse Midwife

## 2017-11-02 ENCOUNTER — Ambulatory Visit (INDEPENDENT_AMBULATORY_CARE_PROVIDER_SITE_OTHER): Payer: Medicare Other | Admitting: Certified Nurse Midwife

## 2017-11-02 VITALS — BP 102/60 | HR 60 | Resp 16 | Ht 61.0 in | Wt 92.0 lb

## 2017-11-02 DIAGNOSIS — Z124 Encounter for screening for malignant neoplasm of cervix: Secondary | ICD-10-CM | POA: Diagnosis not present

## 2017-11-02 DIAGNOSIS — Z01419 Encounter for gynecological examination (general) (routine) without abnormal findings: Secondary | ICD-10-CM

## 2017-11-02 DIAGNOSIS — Z8673 Personal history of transient ischemic attack (TIA), and cerebral infarction without residual deficits: Secondary | ICD-10-CM | POA: Diagnosis not present

## 2017-11-02 DIAGNOSIS — Z780 Asymptomatic menopausal state: Secondary | ICD-10-CM

## 2017-11-02 DIAGNOSIS — N952 Postmenopausal atrophic vaginitis: Secondary | ICD-10-CM | POA: Diagnosis not present

## 2017-11-02 MED ORDER — ESTRADIOL 0.1 MG/GM VA CREA: TOPICAL_CREAM | VAGINAL | 3 refills | Status: DC

## 2017-11-02 NOTE — Patient Instructions (Signed)

## 2017-11-02 NOTE — Progress Notes (Signed)
83 y.o. G2P1001 Widowed  Caucasian Fe here for annual exam.  Menopausal no HRT. Denies vaginal bleeding. Continues Estrogen cream for vaginal dryness. Having some urinary frequency that has increased. Has 4 cups of caffeinated coffee daily and some water and juice. Denies urinary pain or odor. Sees Dr. Forde Dandy for aex and medication management for hypertension, asthma, heart and all labs , has a visit every 3 months. Still driving and working in the yard. No other health issues today.  Patient's last menstrual period was 11/30/1976.          Sexually active: No.  The current method of family planning is status post hysterectomy.    Exercising: No.  exercise Smoker:  no  Health Maintenance: Pap:  03-29-00 neg History of Abnormal Pap: no MMG:  2018 neg pet patient Self Breast exams: yes Colonoscopy:  2017 BMD:   2016 TDaP:  2011? Shingles: pcp Pneumonia: had the older one but not the update Hep C and HIV: not done Labs: yes   reports that she has quit smoking. Her smoking use included cigarettes. She has a 10.00 pack-year smoking history. she has never used smokeless tobacco. She reports that she drinks about 0.6 oz of alcohol per week. She reports that she does not use drugs.  Past Medical History:  Diagnosis Date  . Anemia   . Arthritis    "thumbs" (06/27/2015)  . Bilateral renal artery stenosis ([place])   . Chronic lower back pain   . GERD (gastroesophageal reflux disease)   . Headache    "probably weekly" (06/27/2015)  . Hypertension   . Hypertriglyceridemia   . Migraine    "maybe monthly" (06/27/2015)    Past Surgical History:  Procedure Laterality Date  . BALLOON DILATION  1980's?   "for renal stenosis"  . RECONSTRUCTION OF EYELID Bilateral 01/2016   Upper and lower eyelid  . TOE SURGERY Bilateral    "straightened big toe"  . TONSILLECTOMY  ~ 1941  . VAGINAL HYSTERECTOMY  1973    Current Outpatient Medications  Medication Sig Dispense Refill  . amLODipine (NORVASC) 10  MG tablet Take 1 tablet (10 mg total) by mouth daily. (Patient taking differently: Take 5 mg by mouth daily. ) 30 tablet 0  . clopidogrel (PLAVIX) 75 MG tablet Take 1 tablet (75 mg total) by mouth daily. 30 tablet 0  . estradiol (ESTRACE) 0.1 MG/GM vaginal cream Apply a pea sized amount once a week or just at the urethra 42.5 g 0  . fenofibrate 160 MG tablet Take 160 mg by mouth daily.    Marland Kitchen HYDROcodone-acetaminophen (NORCO/VICODIN) 5-325 MG tablet Take 3 tablets by mouth every 6 (six) hours as needed. for pain  0  . mirtazapine (REMERON) 15 MG tablet Take 15 mg by mouth at bedtime.    . montelukast (SINGULAIR) 10 MG tablet Take 10 mg by mouth daily.  6  . omeprazole (PRILOSEC) 20 MG capsule TAKE 1 CAPSULE IN THE EVENING  5  . propranolol ER (INDERAL LA) 120 MG 24 hr capsule Take 120 mg by mouth daily.  3   No current facility-administered medications for this visit.     Family History  Problem Relation Age of Onset  . Hypertension Brother   . Hyperlipidemia Brother   . Parkinsonism Father   . Stroke Maternal Grandfather     ROS:  Pertinent items are noted in HPI.  Otherwise, a comprehensive ROS was negative.  Exam:   BP 102/60   Pulse 60  Resp 16   Ht 5\' 1"  (1.549 m)   Wt 92 lb (41.7 kg)   LMP 11/30/1976   BMI 17.38 kg/m  Height: 5\' 1"  (154.9 cm) Ht Readings from Last 3 Encounters:  11/02/17 5\' 1"  (1.549 m)  12/18/16 5\' 2"  (1.575 m)  12/11/16 5\' 2"  (1.575 m)    General appearance: alert, cooperative and appears stated age Head: Normocephalic, without obvious abnormality, atraumatic Neck: no adenopathy, supple, symmetrical, trachea midline and thyroid normal to inspection and palpation Lungs: clear to auscultation bilaterally Breasts: normal appearance, no masses or tenderness, No nipple retraction or dimpling, No nipple discharge or bleeding, No axillary or supraclavicular adenopathy, dense feel Heart: regular rate and rhythm Abdomen: soft, non-tender; no masses,  no  organomegaly Extremities: extremities normal, atraumatic, no cyanosis or edema Skin: Skin color, texture, turgor normal. No rashes or lesions Lymph nodes: Cervical, supraclavicular, and axillary nodes normal. No abnormal inguinal nodes palpated Neurologic: Grossly normal   Pelvic: External genitalia:  no lesions, normal female, atrophic appearance              Urethra:  normal appearing urethra with no masses, tenderness or lesions, very prominent              Bartholin's and Skene's: normal                 Vagina: atrophic appearing vagina with normal color and discharge, no lesions              Cervix: absent              Pap taken: No. Bimanual Exam:  Uterus:  uterus absent              Adnexa: no mass, fullness, tenderness               Rectovaginal: Confirms               Anus:  normal sphincter tone, no lesions  Chaperone present: yes  A:  Well Woman with normal exam  Menopausal no HRT  Atrophic vaginitis with Estrogen cream use with good results  Urinary frequency at times, declines urine, drinks caffeine frequently also  Cholesterol, hypertension, asthma with PCP management, history of TIA with same management team per patient   P:   Reviewed health and wellness pertinent to exam  Aware if vaginal bleeding to advise.  Discussed risks/benefits of estrogen cream and warning signs, would like to continue. Encouraged to put pea size around urethral meatus to see if helps with frequency.  Rx Estrace vaginal cream see order with instructions Continue follow up with PCP as indicated.   Discussed driving only in daytime and routes she is familiar with, to be safe.   Pap smear: no   counseled on breast self exam, mammography screening, feminine hygiene, adequate intake of calcium and vitamin D, diet and exercise, Kegel's exercises  return annually or prn  An After Visit Summary was printed and given to the patient.

## 2017-11-05 DIAGNOSIS — Z681 Body mass index (BMI) 19 or less, adult: Secondary | ICD-10-CM | POA: Diagnosis not present

## 2017-11-05 DIAGNOSIS — J40 Bronchitis, not specified as acute or chronic: Secondary | ICD-10-CM | POA: Diagnosis not present

## 2017-11-05 DIAGNOSIS — R05 Cough: Secondary | ICD-10-CM | POA: Diagnosis not present

## 2017-11-05 DIAGNOSIS — J069 Acute upper respiratory infection, unspecified: Secondary | ICD-10-CM | POA: Diagnosis not present

## 2017-12-14 DIAGNOSIS — I10 Essential (primary) hypertension: Secondary | ICD-10-CM | POA: Diagnosis not present

## 2017-12-14 DIAGNOSIS — Z961 Presence of intraocular lens: Secondary | ICD-10-CM | POA: Diagnosis not present

## 2017-12-14 DIAGNOSIS — H02839 Dermatochalasis of unspecified eye, unspecified eyelid: Secondary | ICD-10-CM | POA: Diagnosis not present

## 2017-12-14 DIAGNOSIS — H353130 Nonexudative age-related macular degeneration, bilateral, stage unspecified: Secondary | ICD-10-CM | POA: Diagnosis not present

## 2017-12-14 DIAGNOSIS — H18413 Arcus senilis, bilateral: Secondary | ICD-10-CM | POA: Diagnosis not present

## 2017-12-24 DIAGNOSIS — L821 Other seborrheic keratosis: Secondary | ICD-10-CM | POA: Diagnosis not present

## 2017-12-24 DIAGNOSIS — L570 Actinic keratosis: Secondary | ICD-10-CM | POA: Diagnosis not present

## 2017-12-24 DIAGNOSIS — D1801 Hemangioma of skin and subcutaneous tissue: Secondary | ICD-10-CM | POA: Diagnosis not present

## 2017-12-24 DIAGNOSIS — L814 Other melanin hyperpigmentation: Secondary | ICD-10-CM | POA: Diagnosis not present

## 2017-12-24 DIAGNOSIS — Z85828 Personal history of other malignant neoplasm of skin: Secondary | ICD-10-CM | POA: Diagnosis not present

## 2017-12-24 DIAGNOSIS — H61001 Unspecified perichondritis of right external ear: Secondary | ICD-10-CM | POA: Diagnosis not present

## 2018-01-05 DIAGNOSIS — G894 Chronic pain syndrome: Secondary | ICD-10-CM | POA: Diagnosis not present

## 2018-01-05 DIAGNOSIS — Z79891 Long term (current) use of opiate analgesic: Secondary | ICD-10-CM | POA: Diagnosis not present

## 2018-01-05 DIAGNOSIS — M545 Low back pain: Secondary | ICD-10-CM | POA: Diagnosis not present

## 2018-01-05 DIAGNOSIS — M5416 Radiculopathy, lumbar region: Secondary | ICD-10-CM | POA: Diagnosis not present

## 2018-01-19 DIAGNOSIS — Z1389 Encounter for screening for other disorder: Secondary | ICD-10-CM | POA: Diagnosis not present

## 2018-01-19 DIAGNOSIS — D6489 Other specified anemias: Secondary | ICD-10-CM | POA: Diagnosis not present

## 2018-01-19 DIAGNOSIS — Z23 Encounter for immunization: Secondary | ICD-10-CM | POA: Diagnosis not present

## 2018-01-19 DIAGNOSIS — G894 Chronic pain syndrome: Secondary | ICD-10-CM | POA: Diagnosis not present

## 2018-01-19 DIAGNOSIS — Z681 Body mass index (BMI) 19 or less, adult: Secondary | ICD-10-CM | POA: Diagnosis not present

## 2018-01-19 DIAGNOSIS — I701 Atherosclerosis of renal artery: Secondary | ICD-10-CM | POA: Diagnosis not present

## 2018-01-19 DIAGNOSIS — R7301 Impaired fasting glucose: Secondary | ICD-10-CM | POA: Diagnosis not present

## 2018-01-19 DIAGNOSIS — M79671 Pain in right foot: Secondary | ICD-10-CM | POA: Diagnosis not present

## 2018-01-19 DIAGNOSIS — I779 Disorder of arteries and arterioles, unspecified: Secondary | ICD-10-CM | POA: Diagnosis not present

## 2018-01-19 DIAGNOSIS — E7849 Other hyperlipidemia: Secondary | ICD-10-CM | POA: Diagnosis not present

## 2018-01-19 DIAGNOSIS — M810 Age-related osteoporosis without current pathological fracture: Secondary | ICD-10-CM | POA: Diagnosis not present

## 2018-01-19 DIAGNOSIS — D126 Benign neoplasm of colon, unspecified: Secondary | ICD-10-CM | POA: Diagnosis not present

## 2018-01-20 ENCOUNTER — Ambulatory Visit (HOSPITAL_COMMUNITY)
Admission: RE | Admit: 2018-01-20 | Discharge: 2018-01-20 | Disposition: A | Discharge status: Home / Self Care | Payer: Medicare Other | Source: Ambulatory Visit | Attending: Vascular Surgery | Admitting: Vascular Surgery

## 2018-01-20 ENCOUNTER — Other Ambulatory Visit (HOSPITAL_COMMUNITY): Payer: Self-pay | Admitting: Endocrinology

## 2018-01-20 DIAGNOSIS — M79671 Pain in right foot: Secondary | ICD-10-CM | POA: Diagnosis not present

## 2018-04-20 DIAGNOSIS — M810 Age-related osteoporosis without current pathological fracture: Secondary | ICD-10-CM | POA: Diagnosis not present

## 2018-04-20 DIAGNOSIS — Z681 Body mass index (BMI) 19 or less, adult: Secondary | ICD-10-CM | POA: Diagnosis not present

## 2018-04-20 DIAGNOSIS — G458 Other transient cerebral ischemic attacks and related syndromes: Secondary | ICD-10-CM | POA: Diagnosis not present

## 2018-04-20 DIAGNOSIS — Z23 Encounter for immunization: Secondary | ICD-10-CM | POA: Diagnosis not present

## 2018-04-20 DIAGNOSIS — Z1389 Encounter for screening for other disorder: Secondary | ICD-10-CM | POA: Diagnosis not present

## 2018-04-20 DIAGNOSIS — I701 Atherosclerosis of renal artery: Secondary | ICD-10-CM | POA: Diagnosis not present

## 2018-04-20 DIAGNOSIS — R7301 Impaired fasting glucose: Secondary | ICD-10-CM | POA: Diagnosis not present

## 2018-04-20 DIAGNOSIS — G894 Chronic pain syndrome: Secondary | ICD-10-CM | POA: Diagnosis not present

## 2018-04-20 DIAGNOSIS — I10 Essential (primary) hypertension: Secondary | ICD-10-CM | POA: Diagnosis not present

## 2018-04-20 DIAGNOSIS — E7849 Other hyperlipidemia: Secondary | ICD-10-CM | POA: Diagnosis not present

## 2018-05-03 DIAGNOSIS — I10 Essential (primary) hypertension: Secondary | ICD-10-CM | POA: Diagnosis not present

## 2018-05-03 DIAGNOSIS — K219 Gastro-esophageal reflux disease without esophagitis: Secondary | ICD-10-CM | POA: Insufficient documentation

## 2018-05-03 DIAGNOSIS — R350 Frequency of micturition: Secondary | ICD-10-CM | POA: Diagnosis not present

## 2018-05-14 DIAGNOSIS — M545 Low back pain: Secondary | ICD-10-CM | POA: Diagnosis not present

## 2018-05-14 DIAGNOSIS — M5416 Radiculopathy, lumbar region: Secondary | ICD-10-CM | POA: Diagnosis not present

## 2018-05-14 DIAGNOSIS — Z79899 Other long term (current) drug therapy: Secondary | ICD-10-CM | POA: Diagnosis not present

## 2018-05-14 DIAGNOSIS — Z79891 Long term (current) use of opiate analgesic: Secondary | ICD-10-CM | POA: Diagnosis not present

## 2018-05-14 DIAGNOSIS — G894 Chronic pain syndrome: Secondary | ICD-10-CM | POA: Diagnosis not present

## 2018-06-06 DIAGNOSIS — R350 Frequency of micturition: Secondary | ICD-10-CM | POA: Diagnosis not present

## 2018-06-06 DIAGNOSIS — I10 Essential (primary) hypertension: Secondary | ICD-10-CM | POA: Diagnosis not present

## 2018-06-06 DIAGNOSIS — N390 Urinary tract infection, site not specified: Secondary | ICD-10-CM | POA: Diagnosis not present

## 2018-07-20 DIAGNOSIS — M5416 Radiculopathy, lumbar region: Secondary | ICD-10-CM | POA: Diagnosis not present

## 2018-07-20 DIAGNOSIS — M545 Low back pain: Secondary | ICD-10-CM | POA: Diagnosis not present

## 2018-07-20 DIAGNOSIS — Z79891 Long term (current) use of opiate analgesic: Secondary | ICD-10-CM | POA: Diagnosis not present

## 2018-07-20 DIAGNOSIS — G894 Chronic pain syndrome: Secondary | ICD-10-CM | POA: Diagnosis not present

## 2018-08-22 DIAGNOSIS — Z1231 Encounter for screening mammogram for malignant neoplasm of breast: Secondary | ICD-10-CM | POA: Diagnosis not present

## 2018-09-13 DIAGNOSIS — Z79891 Long term (current) use of opiate analgesic: Secondary | ICD-10-CM | POA: Diagnosis not present

## 2018-09-13 DIAGNOSIS — M5416 Radiculopathy, lumbar region: Secondary | ICD-10-CM | POA: Diagnosis not present

## 2018-09-14 DIAGNOSIS — Z681 Body mass index (BMI) 19 or less, adult: Secondary | ICD-10-CM | POA: Diagnosis not present

## 2018-09-14 DIAGNOSIS — I10 Essential (primary) hypertension: Secondary | ICD-10-CM | POA: Diagnosis not present

## 2018-09-14 DIAGNOSIS — R05 Cough: Secondary | ICD-10-CM | POA: Diagnosis not present

## 2018-09-14 DIAGNOSIS — D649 Anemia, unspecified: Secondary | ICD-10-CM | POA: Diagnosis not present

## 2018-09-14 DIAGNOSIS — I701 Atherosclerosis of renal artery: Secondary | ICD-10-CM | POA: Diagnosis not present

## 2018-09-14 DIAGNOSIS — J432 Centrilobular emphysema: Secondary | ICD-10-CM | POA: Diagnosis not present

## 2018-09-14 DIAGNOSIS — M1990 Unspecified osteoarthritis, unspecified site: Secondary | ICD-10-CM | POA: Diagnosis not present

## 2018-09-14 DIAGNOSIS — F329 Major depressive disorder, single episode, unspecified: Secondary | ICD-10-CM | POA: Diagnosis not present

## 2018-09-14 DIAGNOSIS — D126 Benign neoplasm of colon, unspecified: Secondary | ICD-10-CM | POA: Diagnosis not present

## 2018-09-14 DIAGNOSIS — R7301 Impaired fasting glucose: Secondary | ICD-10-CM | POA: Diagnosis not present

## 2018-09-14 DIAGNOSIS — N281 Cyst of kidney, acquired: Secondary | ICD-10-CM | POA: Diagnosis not present

## 2018-09-14 DIAGNOSIS — E7849 Other hyperlipidemia: Secondary | ICD-10-CM | POA: Diagnosis not present

## 2018-09-16 ENCOUNTER — Other Ambulatory Visit: Payer: Self-pay | Admitting: Endocrinology

## 2018-09-16 DIAGNOSIS — N281 Cyst of kidney, acquired: Secondary | ICD-10-CM

## 2018-09-20 ENCOUNTER — Ambulatory Visit
Admission: RE | Admit: 2018-09-20 | Discharge: 2018-09-20 | Disposition: A | Discharge status: Home / Self Care | Payer: Medicare Other | Source: Ambulatory Visit | Attending: Endocrinology | Admitting: Endocrinology

## 2018-09-20 DIAGNOSIS — N281 Cyst of kidney, acquired: Secondary | ICD-10-CM | POA: Diagnosis not present

## 2018-09-21 DIAGNOSIS — Z23 Encounter for immunization: Secondary | ICD-10-CM | POA: Diagnosis not present

## 2018-11-09 ENCOUNTER — Ambulatory Visit: Payer: Medicare Other | Admitting: Certified Nurse Midwife

## 2018-11-14 DIAGNOSIS — R05 Cough: Secondary | ICD-10-CM | POA: Diagnosis not present

## 2018-11-14 DIAGNOSIS — J0100 Acute maxillary sinusitis, unspecified: Secondary | ICD-10-CM | POA: Diagnosis not present

## 2018-11-14 DIAGNOSIS — R062 Wheezing: Secondary | ICD-10-CM | POA: Diagnosis not present

## 2018-12-07 ENCOUNTER — Ambulatory Visit: Payer: Medicare Other | Admitting: Certified Nurse Midwife

## 2018-12-20 DIAGNOSIS — H04123 Dry eye syndrome of bilateral lacrimal glands: Secondary | ICD-10-CM | POA: Diagnosis not present

## 2018-12-20 DIAGNOSIS — Z961 Presence of intraocular lens: Secondary | ICD-10-CM | POA: Diagnosis not present

## 2018-12-20 DIAGNOSIS — H18413 Arcus senilis, bilateral: Secondary | ICD-10-CM | POA: Diagnosis not present

## 2018-12-20 DIAGNOSIS — H353131 Nonexudative age-related macular degeneration, bilateral, early dry stage: Secondary | ICD-10-CM | POA: Diagnosis not present

## 2018-12-22 ENCOUNTER — Other Ambulatory Visit: Payer: Self-pay

## 2018-12-22 ENCOUNTER — Encounter: Payer: Self-pay | Admitting: Certified Nurse Midwife

## 2018-12-22 ENCOUNTER — Ambulatory Visit (INDEPENDENT_AMBULATORY_CARE_PROVIDER_SITE_OTHER): Payer: Medicare Other | Admitting: Certified Nurse Midwife

## 2018-12-22 VITALS — BP 120/80 | HR 60 | Resp 16 | Ht 61.25 in | Wt 92.0 lb

## 2018-12-22 DIAGNOSIS — N952 Postmenopausal atrophic vaginitis: Secondary | ICD-10-CM

## 2018-12-22 DIAGNOSIS — Z01419 Encounter for gynecological examination (general) (routine) without abnormal findings: Secondary | ICD-10-CM

## 2018-12-22 DIAGNOSIS — Z124 Encounter for screening for malignant neoplasm of cervix: Secondary | ICD-10-CM

## 2018-12-22 NOTE — Patient Instructions (Signed)

## 2018-12-22 NOTE — Progress Notes (Signed)
85 y.o. [id number] Widowed  Caucasian Fe here for annual exam. Post menopausal denies vaginal dryness. Sexually active twice weekly with partner. Sees PCP Dr. Forde Dandy for aex and labs every 4 months for Hypertension, cholesterol, GERD. Sees Cardiology as needed. Has noted occasional urinary leakage with holding urine too long. Eats well, no constipation issues. Drives locally. Daughter supportive and checks on her frequently. Stays socially involved. "Happy to be here today"  No health issues today.  Patient's last menstrual period was 11/30/1976.          Sexually active: Yes.    The current method of family planning is status post hysterectomy.    Exercising: Yes.    walking Smoker:  no  Review of Systems  Constitutional: Negative.   HENT: Negative.   Eyes: Negative.   Respiratory: Negative.   Cardiovascular: Negative.   Gastrointestinal: Negative.   Genitourinary: Negative.   Musculoskeletal: Negative.   Skin: Negative.   Neurological: Negative.   Endo/Heme/Allergies: Negative.   Psychiatric/Behavioral: Negative.     Health Maintenance: Pap:  03-29-00 neg History of Abnormal Pap: no MMG:  2019 waiting on fax Self Breast exams: yes Colonoscopy:  2017 BMD:   2016 TDaP:  UTD Shingles: had done Pneumonia: had done Hep C and HIV: not done Labs: yes   reports that she has quit smoking. Her smoking use included cigarettes. She has a 10.00 pack-year smoking history. She has never used smokeless tobacco. She reports current alcohol use of about 1.0 standard drinks of alcohol per week. She reports that she does not use drugs.  Past Medical History:  Diagnosis Date  . Anemia   . Arthritis    "thumbs" (06/27/2015)  . Bilateral renal artery stenosis ([place])   . Chronic lower back pain   . GERD (gastroesophageal reflux disease)   . Headache    "probably weekly" (06/27/2015)  . Hypertension   . Hypertriglyceridemia   . Migraine    "maybe monthly" (06/27/2015)    Past Surgical History:   Procedure Laterality Date  . BALLOON DILATION  1980's?   "for renal stenosis"  . RECONSTRUCTION OF EYELID Bilateral 01/2016   Upper and lower eyelid  . TOE SURGERY Bilateral    "straightened big toe"  . TONSILLECTOMY  ~ 1941  . VAGINAL HYSTERECTOMY  1973    Current Outpatient Medications  Medication Sig Dispense Refill  . amLODipine (NORVASC) 10 MG tablet Take 1 tablet (10 mg total) by mouth daily. (Patient taking differently: Take 5 mg by mouth daily. ) 30 tablet 0  . clopidogrel (PLAVIX) 75 MG tablet Take 1 tablet (75 mg total) by mouth daily. 30 tablet 0  . estradiol (ESTRACE) 0.1 MG/GM vaginal cream Apply a pea sized amount once a week or just at the urethra 42.5 g 3  . fenofibrate 160 MG tablet Take 160 mg by mouth daily.    Marland Kitchen HYDROcodone-acetaminophen (NORCO/VICODIN) 5-325 MG tablet Take 3 tablets by mouth every 6 (six) hours as needed. for pain  0  . mirtazapine (REMERON) 15 MG tablet Take 15 mg by mouth at bedtime.    . montelukast (SINGULAIR) 10 MG tablet Take 10 mg by mouth daily.  6  . omeprazole (PRILOSEC) 20 MG capsule TAKE 1 CAPSULE IN THE EVENING  5  . propranolol ER (INDERAL LA) 120 MG 24 hr capsule Take 120 mg by mouth daily.  3   No current facility-administered medications for this visit.     Family History  Problem Relation Age of Onset  .  Hypertension Brother   . Hyperlipidemia Brother   . Parkinsonism Father   . Stroke Maternal Grandfather     ROS:  Pertinent items are noted in HPI.  Otherwise, a comprehensive ROS was negative.  Exam:   LMP 11/30/1976    Ht Readings from Last 3 Encounters:  11/02/17 5\' 1"  (1.549 m)  12/18/16 5\' 2"  (1.575 m)  12/11/16 5\' 2"  (1.575 m)    General appearance: alert, cooperative and appears stated age Head: Normocephalic, without obvious abnormality, atraumatic Neck: no adenopathy, supple, symmetrical, trachea midline and thyroid normal to inspection and palpation Lungs: clear to auscultation bilaterally Breasts:  normal appearance, no masses or tenderness, No nipple retraction or dimpling, No nipple discharge or bleeding, No axillary or supraclavicular adenopathy Heart: regular rate and rhythm Abdomen: soft, non-tender; no masses,  no organomegaly Extremities: extremities normal, atraumatic, no cyanosis or edema Skin: Skin color, texture, turgor normal. No rashes or lesions Lymph nodes: Cervical, supraclavicular, and axillary nodes normal. No abnormal inguinal nodes palpated Neurologic: Grossly normal   Pelvic: External genitalia:  no lesions, atrophic appearance               Urethra:  normal appearing urethra with no masses, tenderness or lesions              Bartholin's and Skene's: normal                 Vagina:atrophic appearing vagina with normal color and moisture noted, no lesions              Cervix: absent              Pap taken: No. Bimanual Exam:  Uterus:  uterus absent              Adnexa: normal adnexa and no mass, fullness, tenderness               Rectovaginal: Confirms               Anus:  normal sphincter tone, no lesions  Chaperone present: yes  A:  Well Woman with normal exam   Post menopausal s/p TVH, uses Estrace cream occasional  For vaginal dryness, prefers not to use and would another option.   PCP management of GERD, cholesterol, hypertension, all stable per patient.  P:   Reviewed health and wellness pertinent to exam  Discussed she can use coconut oil or Olive oil for dryness and sexual activity if needed. Discussed using small amount around urinary meatus once weekly to help with stress incontinence if she desires. Has Rx, declines refill.  Continue follow up with PCP as indicated  Pap smear: no   counseled on breast self exam, mammography screening, feminine hygiene, discussed not holding urine for long periods of time to help with incontinence, adequate intake of calcium and vitamin D, diet and exercise  return annually or prn  An After Visit Summary was  printed and given to the patient.

## 2018-12-27 DIAGNOSIS — D2262 Melanocytic nevi of left upper limb, including shoulder: Secondary | ICD-10-CM | POA: Diagnosis not present

## 2018-12-27 DIAGNOSIS — L570 Actinic keratosis: Secondary | ICD-10-CM | POA: Diagnosis not present

## 2018-12-27 DIAGNOSIS — L821 Other seborrheic keratosis: Secondary | ICD-10-CM | POA: Diagnosis not present

## 2018-12-27 DIAGNOSIS — D1801 Hemangioma of skin and subcutaneous tissue: Secondary | ICD-10-CM | POA: Diagnosis not present

## 2018-12-27 DIAGNOSIS — Z85828 Personal history of other malignant neoplasm of skin: Secondary | ICD-10-CM | POA: Diagnosis not present

## 2018-12-27 DIAGNOSIS — D0471 Carcinoma in situ of skin of right lower limb, including hip: Secondary | ICD-10-CM | POA: Diagnosis not present

## 2018-12-27 DIAGNOSIS — D485 Neoplasm of uncertain behavior of skin: Secondary | ICD-10-CM | POA: Diagnosis not present

## 2019-01-05 DIAGNOSIS — D0471 Carcinoma in situ of skin of right lower limb, including hip: Secondary | ICD-10-CM | POA: Diagnosis not present

## 2019-01-11 DIAGNOSIS — Z79899 Other long term (current) drug therapy: Secondary | ICD-10-CM | POA: Diagnosis not present

## 2019-01-11 DIAGNOSIS — Z79891 Long term (current) use of opiate analgesic: Secondary | ICD-10-CM | POA: Diagnosis not present

## 2019-01-11 DIAGNOSIS — M5416 Radiculopathy, lumbar region: Secondary | ICD-10-CM | POA: Diagnosis not present

## 2019-01-11 DIAGNOSIS — M545 Low back pain: Secondary | ICD-10-CM | POA: Diagnosis not present

## 2019-01-11 DIAGNOSIS — Z5181 Encounter for therapeutic drug level monitoring: Secondary | ICD-10-CM | POA: Diagnosis not present

## 2019-01-11 DIAGNOSIS — G894 Chronic pain syndrome: Secondary | ICD-10-CM | POA: Diagnosis not present

## 2019-05-04 DIAGNOSIS — I10 Essential (primary) hypertension: Secondary | ICD-10-CM | POA: Diagnosis not present

## 2019-05-04 DIAGNOSIS — M810 Age-related osteoporosis without current pathological fracture: Secondary | ICD-10-CM | POA: Diagnosis not present

## 2019-05-04 DIAGNOSIS — R636 Underweight: Secondary | ICD-10-CM | POA: Diagnosis not present

## 2019-05-04 DIAGNOSIS — N281 Cyst of kidney, acquired: Secondary | ICD-10-CM | POA: Diagnosis not present

## 2019-05-04 DIAGNOSIS — F329 Major depressive disorder, single episode, unspecified: Secondary | ICD-10-CM | POA: Diagnosis not present

## 2019-05-04 DIAGNOSIS — G894 Chronic pain syndrome: Secondary | ICD-10-CM | POA: Diagnosis not present

## 2019-05-04 DIAGNOSIS — E785 Hyperlipidemia, unspecified: Secondary | ICD-10-CM | POA: Diagnosis not present

## 2019-05-04 DIAGNOSIS — J432 Centrilobular emphysema: Secondary | ICD-10-CM | POA: Diagnosis not present

## 2019-05-04 DIAGNOSIS — I739 Peripheral vascular disease, unspecified: Secondary | ICD-10-CM | POA: Diagnosis not present

## 2019-05-04 DIAGNOSIS — R05 Cough: Secondary | ICD-10-CM | POA: Diagnosis not present

## 2019-05-04 DIAGNOSIS — D649 Anemia, unspecified: Secondary | ICD-10-CM | POA: Diagnosis not present

## 2019-05-04 DIAGNOSIS — D126 Benign neoplasm of colon, unspecified: Secondary | ICD-10-CM | POA: Diagnosis not present

## 2019-05-05 DIAGNOSIS — M810 Age-related osteoporosis without current pathological fracture: Secondary | ICD-10-CM | POA: Diagnosis not present

## 2019-05-05 DIAGNOSIS — R634 Abnormal weight loss: Secondary | ICD-10-CM | POA: Diagnosis not present

## 2019-05-05 DIAGNOSIS — I10 Essential (primary) hypertension: Secondary | ICD-10-CM | POA: Diagnosis not present

## 2019-05-05 DIAGNOSIS — E7849 Other hyperlipidemia: Secondary | ICD-10-CM | POA: Diagnosis not present

## 2019-05-05 DIAGNOSIS — R7301 Impaired fasting glucose: Secondary | ICD-10-CM | POA: Diagnosis not present

## 2019-05-10 DIAGNOSIS — M5416 Radiculopathy, lumbar region: Secondary | ICD-10-CM | POA: Diagnosis not present

## 2019-05-10 DIAGNOSIS — Z79891 Long term (current) use of opiate analgesic: Secondary | ICD-10-CM | POA: Diagnosis not present

## 2019-05-10 DIAGNOSIS — G894 Chronic pain syndrome: Secondary | ICD-10-CM | POA: Diagnosis not present

## 2019-05-10 DIAGNOSIS — M545 Low back pain: Secondary | ICD-10-CM | POA: Diagnosis not present

## 2019-08-22 DIAGNOSIS — I10 Essential (primary) hypertension: Secondary | ICD-10-CM | POA: Diagnosis not present

## 2019-08-22 DIAGNOSIS — I701 Atherosclerosis of renal artery: Secondary | ICD-10-CM | POA: Diagnosis not present

## 2019-08-22 DIAGNOSIS — I5189 Other ill-defined heart diseases: Secondary | ICD-10-CM | POA: Diagnosis not present

## 2019-08-22 DIAGNOSIS — R05 Cough: Secondary | ICD-10-CM | POA: Diagnosis not present

## 2019-08-22 DIAGNOSIS — N281 Cyst of kidney, acquired: Secondary | ICD-10-CM | POA: Diagnosis not present

## 2019-08-22 DIAGNOSIS — I739 Peripheral vascular disease, unspecified: Secondary | ICD-10-CM | POA: Diagnosis not present

## 2019-08-22 DIAGNOSIS — G43909 Migraine, unspecified, not intractable, without status migrainosus: Secondary | ICD-10-CM | POA: Diagnosis not present

## 2019-08-22 DIAGNOSIS — Z1331 Encounter for screening for depression: Secondary | ICD-10-CM | POA: Diagnosis not present

## 2019-08-22 DIAGNOSIS — G894 Chronic pain syndrome: Secondary | ICD-10-CM | POA: Diagnosis not present

## 2019-08-22 DIAGNOSIS — M810 Age-related osteoporosis without current pathological fracture: Secondary | ICD-10-CM | POA: Diagnosis not present

## 2019-08-22 DIAGNOSIS — R7301 Impaired fasting glucose: Secondary | ICD-10-CM | POA: Diagnosis not present

## 2019-08-22 DIAGNOSIS — M1990 Unspecified osteoarthritis, unspecified site: Secondary | ICD-10-CM | POA: Diagnosis not present

## 2019-08-22 DIAGNOSIS — R636 Underweight: Secondary | ICD-10-CM | POA: Diagnosis not present

## 2019-08-28 ENCOUNTER — Encounter: Payer: Self-pay | Admitting: Certified Nurse Midwife

## 2019-08-28 DIAGNOSIS — Z1231 Encounter for screening mammogram for malignant neoplasm of breast: Secondary | ICD-10-CM | POA: Diagnosis not present

## 2019-08-31 DIAGNOSIS — Z23 Encounter for immunization: Secondary | ICD-10-CM | POA: Diagnosis not present

## 2019-09-22 DIAGNOSIS — M545 Low back pain: Secondary | ICD-10-CM | POA: Diagnosis not present

## 2019-09-22 DIAGNOSIS — G894 Chronic pain syndrome: Secondary | ICD-10-CM | POA: Diagnosis not present

## 2019-09-22 DIAGNOSIS — M5416 Radiculopathy, lumbar region: Secondary | ICD-10-CM | POA: Diagnosis not present

## 2019-09-22 DIAGNOSIS — R52 Pain, unspecified: Secondary | ICD-10-CM | POA: Diagnosis not present

## 2019-09-22 DIAGNOSIS — Z79891 Long term (current) use of opiate analgesic: Secondary | ICD-10-CM | POA: Diagnosis not present

## 2019-12-26 DIAGNOSIS — H18413 Arcus senilis, bilateral: Secondary | ICD-10-CM | POA: Diagnosis not present

## 2019-12-26 DIAGNOSIS — H353131 Nonexudative age-related macular degeneration, bilateral, early dry stage: Secondary | ICD-10-CM | POA: Diagnosis not present

## 2019-12-26 DIAGNOSIS — H04123 Dry eye syndrome of bilateral lacrimal glands: Secondary | ICD-10-CM | POA: Diagnosis not present

## 2019-12-26 DIAGNOSIS — Z961 Presence of intraocular lens: Secondary | ICD-10-CM | POA: Diagnosis not present

## 2020-01-16 ENCOUNTER — Ambulatory Visit: Payer: Medicare Other | Admitting: Certified Nurse Midwife

## 2020-01-19 DIAGNOSIS — Z79891 Long term (current) use of opiate analgesic: Secondary | ICD-10-CM | POA: Diagnosis not present

## 2020-01-19 DIAGNOSIS — D6869 Other thrombophilia: Secondary | ICD-10-CM | POA: Diagnosis not present

## 2020-01-19 DIAGNOSIS — M545 Low back pain: Secondary | ICD-10-CM | POA: Diagnosis not present

## 2020-01-19 DIAGNOSIS — Z5181 Encounter for therapeutic drug level monitoring: Secondary | ICD-10-CM | POA: Diagnosis not present

## 2020-01-19 DIAGNOSIS — Z79899 Other long term (current) drug therapy: Secondary | ICD-10-CM | POA: Diagnosis not present

## 2020-01-19 DIAGNOSIS — G894 Chronic pain syndrome: Secondary | ICD-10-CM | POA: Diagnosis not present

## 2020-01-23 DIAGNOSIS — L918 Other hypertrophic disorders of the skin: Secondary | ICD-10-CM | POA: Diagnosis not present

## 2020-01-23 DIAGNOSIS — Z85828 Personal history of other malignant neoplasm of skin: Secondary | ICD-10-CM | POA: Diagnosis not present

## 2020-01-23 DIAGNOSIS — L570 Actinic keratosis: Secondary | ICD-10-CM | POA: Diagnosis not present

## 2020-01-23 DIAGNOSIS — D224 Melanocytic nevi of scalp and neck: Secondary | ICD-10-CM | POA: Diagnosis not present

## 2020-01-23 DIAGNOSIS — D1801 Hemangioma of skin and subcutaneous tissue: Secondary | ICD-10-CM | POA: Diagnosis not present

## 2020-01-23 DIAGNOSIS — L819 Disorder of pigmentation, unspecified: Secondary | ICD-10-CM | POA: Diagnosis not present

## 2020-01-23 DIAGNOSIS — L820 Inflamed seborrheic keratosis: Secondary | ICD-10-CM | POA: Diagnosis not present

## 2020-01-23 DIAGNOSIS — L905 Scar conditions and fibrosis of skin: Secondary | ICD-10-CM | POA: Diagnosis not present

## 2020-01-23 DIAGNOSIS — L821 Other seborrheic keratosis: Secondary | ICD-10-CM | POA: Diagnosis not present

## 2020-02-09 DIAGNOSIS — D649 Anemia, unspecified: Secondary | ICD-10-CM | POA: Diagnosis not present

## 2020-02-09 DIAGNOSIS — R627 Adult failure to thrive: Secondary | ICD-10-CM | POA: Diagnosis not present

## 2020-02-09 DIAGNOSIS — R636 Underweight: Secondary | ICD-10-CM | POA: Diagnosis not present

## 2020-02-09 DIAGNOSIS — E7849 Other hyperlipidemia: Secondary | ICD-10-CM | POA: Diagnosis not present

## 2020-02-09 DIAGNOSIS — I5189 Other ill-defined heart diseases: Secondary | ICD-10-CM | POA: Diagnosis not present

## 2020-02-09 DIAGNOSIS — M810 Age-related osteoporosis without current pathological fracture: Secondary | ICD-10-CM | POA: Diagnosis not present

## 2020-02-09 DIAGNOSIS — R7301 Impaired fasting glucose: Secondary | ICD-10-CM | POA: Diagnosis not present

## 2020-02-09 DIAGNOSIS — I700 Atherosclerosis of aorta: Secondary | ICD-10-CM | POA: Diagnosis not present

## 2020-02-09 DIAGNOSIS — Z1331 Encounter for screening for depression: Secondary | ICD-10-CM | POA: Diagnosis not present

## 2020-02-09 DIAGNOSIS — I10 Essential (primary) hypertension: Secondary | ICD-10-CM | POA: Diagnosis not present

## 2020-02-09 DIAGNOSIS — R0600 Dyspnea, unspecified: Secondary | ICD-10-CM | POA: Diagnosis not present

## 2020-02-09 DIAGNOSIS — G894 Chronic pain syndrome: Secondary | ICD-10-CM | POA: Diagnosis not present

## 2020-02-09 DIAGNOSIS — N1831 Chronic kidney disease, stage 3a: Secondary | ICD-10-CM | POA: Diagnosis not present

## 2020-02-20 ENCOUNTER — Other Ambulatory Visit: Payer: Self-pay | Admitting: Endocrinology

## 2020-02-20 DIAGNOSIS — Z1331 Encounter for screening for depression: Secondary | ICD-10-CM | POA: Diagnosis not present

## 2020-02-20 DIAGNOSIS — B029 Zoster without complications: Secondary | ICD-10-CM | POA: Diagnosis not present

## 2020-02-20 DIAGNOSIS — R109 Unspecified abdominal pain: Secondary | ICD-10-CM

## 2020-02-20 DIAGNOSIS — I10 Essential (primary) hypertension: Secondary | ICD-10-CM | POA: Diagnosis not present

## 2020-02-20 DIAGNOSIS — R627 Adult failure to thrive: Secondary | ICD-10-CM | POA: Diagnosis not present

## 2020-02-20 DIAGNOSIS — F329 Major depressive disorder, single episode, unspecified: Secondary | ICD-10-CM | POA: Diagnosis not present

## 2020-02-20 DIAGNOSIS — R1013 Epigastric pain: Secondary | ICD-10-CM | POA: Diagnosis not present

## 2020-02-20 DIAGNOSIS — G894 Chronic pain syndrome: Secondary | ICD-10-CM | POA: Diagnosis not present

## 2020-02-21 ENCOUNTER — Encounter: Payer: Self-pay | Admitting: Certified Nurse Midwife

## 2020-02-29 DIAGNOSIS — R1013 Epigastric pain: Secondary | ICD-10-CM | POA: Diagnosis not present

## 2020-02-29 DIAGNOSIS — B029 Zoster without complications: Secondary | ICD-10-CM | POA: Diagnosis not present

## 2020-02-29 DIAGNOSIS — J029 Acute pharyngitis, unspecified: Secondary | ICD-10-CM | POA: Diagnosis not present

## 2020-02-29 DIAGNOSIS — K279 Peptic ulcer, site unspecified, unspecified as acute or chronic, without hemorrhage or perforation: Secondary | ICD-10-CM | POA: Diagnosis not present

## 2020-02-29 DIAGNOSIS — B370 Candidal stomatitis: Secondary | ICD-10-CM | POA: Diagnosis not present

## 2020-03-07 ENCOUNTER — Other Ambulatory Visit: Payer: Self-pay

## 2020-03-07 ENCOUNTER — Ambulatory Visit
Admission: RE | Admit: 2020-03-07 | Discharge: 2020-03-07 | Disposition: A | Discharge status: Home / Self Care | Payer: Medicare Other | Source: Ambulatory Visit | Attending: Endocrinology | Admitting: Endocrinology

## 2020-03-07 DIAGNOSIS — K7689 Other specified diseases of liver: Secondary | ICD-10-CM | POA: Diagnosis not present

## 2020-03-07 DIAGNOSIS — N281 Cyst of kidney, acquired: Secondary | ICD-10-CM | POA: Diagnosis not present

## 2020-03-07 DIAGNOSIS — R109 Unspecified abdominal pain: Secondary | ICD-10-CM

## 2020-03-07 DIAGNOSIS — K5730 Diverticulosis of large intestine without perforation or abscess without bleeding: Secondary | ICD-10-CM | POA: Diagnosis not present

## 2020-03-07 MED ORDER — IOPAMIDOL (ISOVUE-300) INJECTION 61%
100.0000 mL | Freq: Once | INTRAVENOUS | Status: AC | PRN
  Administered 2020-03-07: 100 mL via INTRAVENOUS

## 2020-03-11 ENCOUNTER — Ambulatory Visit: Payer: Medicare Other | Admitting: Certified Nurse Midwife

## 2020-03-15 ENCOUNTER — Encounter (HOSPITAL_COMMUNITY): Payer: Self-pay | Admitting: Emergency Medicine

## 2020-03-15 ENCOUNTER — Other Ambulatory Visit: Payer: Self-pay

## 2020-03-15 ENCOUNTER — Emergency Department (HOSPITAL_COMMUNITY): Payer: Medicare Other

## 2020-03-15 ENCOUNTER — Emergency Department (HOSPITAL_COMMUNITY)
Admission: EM | Admit: 2020-03-15 | Discharge: 2020-03-15 | Disposition: A | Discharge status: Home / Self Care | Payer: Medicare Other | Attending: Emergency Medicine | Admitting: Emergency Medicine

## 2020-03-15 DIAGNOSIS — B029 Zoster without complications: Secondary | ICD-10-CM | POA: Insufficient documentation

## 2020-03-15 DIAGNOSIS — Z79899 Other long term (current) drug therapy: Secondary | ICD-10-CM | POA: Insufficient documentation

## 2020-03-15 DIAGNOSIS — R1013 Epigastric pain: Secondary | ICD-10-CM | POA: Insufficient documentation

## 2020-03-15 DIAGNOSIS — I10 Essential (primary) hypertension: Secondary | ICD-10-CM | POA: Diagnosis not present

## 2020-03-15 DIAGNOSIS — Z87891 Personal history of nicotine dependence: Secondary | ICD-10-CM | POA: Diagnosis not present

## 2020-03-15 DIAGNOSIS — R079 Chest pain, unspecified: Secondary | ICD-10-CM | POA: Diagnosis not present

## 2020-03-15 DIAGNOSIS — Z7902 Long term (current) use of antithrombotics/antiplatelets: Secondary | ICD-10-CM | POA: Diagnosis not present

## 2020-03-15 DIAGNOSIS — Z8673 Personal history of transient ischemic attack (TIA), and cerebral infarction without residual deficits: Secondary | ICD-10-CM | POA: Insufficient documentation

## 2020-03-15 LAB — COMPREHENSIVE METABOLIC PANEL
ALT: 20 U/L (ref 0–44)
AST: 25 U/L (ref 15–41)
Albumin: 3.8 g/dL (ref 3.5–5.0)
Alkaline Phosphatase: 72 U/L (ref 38–126)
Anion gap: 8 (ref 5–15)
BUN: 14 mg/dL (ref 8–23)
CO2: 25 mmol/L (ref 22–32)
Calcium: 9.5 mg/dL (ref 8.9–10.3)
Chloride: 101 mmol/L (ref 98–111)
Creatinine, Ser: 0.84 mg/dL (ref 0.44–1.00)
GFR calc Af Amer: >60 mL/min (ref >60)
GFR calc non Af Amer: >60 mL/min (ref >60)
Glucose, Bld: 102 mg/dL — ABNORMAL HIGH (ref 70–99)
Potassium: 4.5 mmol/L (ref 3.5–5.1)
Sodium: 134 mmol/L — ABNORMAL LOW (ref 135–145)
Total Bilirubin: 0.9 mg/dL (ref 0.3–1.2)
Total Protein: 6.8 g/dL (ref 6.5–8.1)

## 2020-03-15 LAB — TROPONIN I (HIGH SENSITIVITY): Troponin I (High Sensitivity): 9 ng/L (ref <18)

## 2020-03-15 LAB — CBC
HCT: 43.6 % (ref 36.0–46.0)
Hemoglobin: 13.6 g/dL (ref 12.0–15.0)
MCH: 29.6 pg (ref 26.0–34.0)
MCHC: 31.2 g/dL (ref 30.0–36.0)
MCV: 95 fL (ref 80.0–100.0)
Platelets: 334 10*3/uL (ref 150–400)
RBC: 4.59 MIL/uL (ref 3.87–5.11)
RDW: 15.3 % (ref 11.5–15.5)
WBC: 8.4 10*3/uL (ref 4.0–10.5)
nRBC: 0 % (ref 0.0–0.2)

## 2020-03-15 LAB — LIPASE, BLOOD: Lipase: 44 U/L (ref 11–51)

## 2020-03-15 MED ORDER — LIDOCAINE VISCOUS HCL 2 % MT SOLN
15.0000 mL | Freq: Once | OROMUCOSAL | Status: AC
  Administered 2020-03-15: 15 mL via ORAL
  Filled 2020-03-15: qty 15

## 2020-03-15 MED ORDER — ALUM & MAG HYDROXIDE-SIMETH 200-200-20 MG/5ML PO SUSP
30.0000 mL | Freq: Once | ORAL | Status: AC
  Administered 2020-03-15: 30 mL via ORAL
  Filled 2020-03-15: qty 30

## 2020-03-15 MED ORDER — DICYCLOMINE HCL 10 MG/5ML PO SOLN
10.0000 mg | Freq: Once | ORAL | Status: AC
  Administered 2020-03-15: 14:00:00 10 mg via ORAL
  Filled 2020-03-15: qty 5

## 2020-03-15 NOTE — ED Triage Notes (Signed)
Patient reports 3 weeks ago having pain to her upper abdomen and then breakout under left breast to her back where she was diagnosed with shingles. Patient was sent here by PCP to see a GI specialist for ongoing nausea and pain. Took hydrocodone, Zofran and part of BP medication this am PTA.

## 2020-03-15 NOTE — ED Provider Notes (Signed)
  Face-to-face evaluation   History: She presents for evaluation of upper abdominal pain ongoing for several weeks.  PCP has been treating and evaluating her.  She is receiving medications for shingles, currently.  She had a CT of the abdomen pelvis done as an outpatient, several days ago.  Physical exam: Alert elderly female, who appears fairly comfortable.  She has a shingles rash of her left lower thorax extending to the midline anteriorly.  No respiratory distress.  Abdomen soft and nontender.  Medical screening examination/treatment/procedure(s) were conducted as a shared visit with non-physician practitioner(s) and myself.  I personally evaluated the patient during the encounter    Daleen Bo, MD 03/16/20 1622

## 2020-03-15 NOTE — ED Provider Notes (Signed)
[facility] EMERGENCY DEPARTMENT Provider Note   CSN: [id number] Arrival date & time: 03/15/20  0900     History Chief Complaint  Patient presents with   Abdominal Pain    Jessica [name] is a 86 y.o. female with a pertinent past medical history of hypertension, anemia, arthritis, chronic lower back pain due to bulging disks (she is on chronic opioid medication daily).  She presents to the emergency department for epigastric pain that started 3 weeks ago.  She was seen by her PCP 3 weeks ago for her epigastric pain and diagnosed with shingles from her left breast to her back and was treated with Valtrex.  As the shingles resolved she continued having the pain and went back to her primary who did a CT abdomen pelvis(4/8)  which was unrevealing.  She is in the process of seeing GI ([place]) but states she could not get an appointment for about a month.  She presented to the emergency department today for worsening pain and wanting to see GI specialist in the emergency department.  She states that the pain originates from her midepigastrium where her shingles starts and moves up into her mid sternum.  She states that the pain is 10 out of 10, constant, and does not radiate anywhere.  She states that the pain wakes her up in the middle the night and she cannot fall asleep afterwards.  She takes about 30 mg of hydrocodone daily for pain which is the only thing that alleviates it.  She denies any dysphagia or odynophagia.  She has no trouble swallowing liquids or solid foods and has been eating and drinking as she normally would. She denies burning sensation. She states that she is having normal bowel movements, denies hematochezia.  She admits to some nausea which has caused her to lose about 3 pounds in 3 weeks.  She also admits to some vomiting, NBNB, about twice a week due to her pain.  She denies any fevers, abdominal pain elsewhere, back pain, chills, night sweats, chest pain, shortness  of breath, dizziness, headache, numbness/tingling, regurgitation.  She does admit to a cough with clear frothy sputum that has been ongoing for the past 3 months. Her PCP does know about this. Her shingles have not been bothering her for the past week and have been resolving.  She states that she lives alone and is independent, however the for the past 3 weeks she has had to have her daughter live with her due to her pain.  She is medication compliant. She did take her BP medication this morning.   HPI     Past Medical History:  Diagnosis Date   Anemia    Arthritis    "thumbs" (06/27/2015)   Bilateral renal artery stenosis ([facility])    Chronic lower back pain    GERD (gastroesophageal reflux disease)    Headache    "probably weekly" (06/27/2015)   Hypertension    Hypertriglyceridemia    Migraine    "maybe monthly" (06/27/2015)    Patient Active Problem List   Diagnosis Date Noted   Gastroesophageal reflux disease 05/03/2018   Long-term current use of opiate analgesic 01/05/2018   TIA (transient ischemic attack) 06/28/2015   Dyslipidemia 06/28/2015   Renal artery stenosis ([place]) 06/28/2015   Hypertensive urgency 06/27/2015   Chronic headaches 05/09/2014   Chronic low back pain 05/09/2014   Lumbar radiculopathy 05/09/2014   Post-nasal drainage 04/25/2014   Dysphagia 04/25/2014   Vocal cord atrophy 04/19/2013  Essential hypertension, benign 02/06/2013    Past Surgical History:  Procedure Laterality Date   BALLOON DILATION  1980's?   "for renal stenosis"   RECONSTRUCTION OF EYELID Bilateral 01/2016   Upper and lower eyelid   TOE SURGERY Bilateral    "straightened big toe"   TONSILLECTOMY  ~ [place]     OB History    Gravida  1   Para  1   Term  1   Preterm  0   AB  0   Living  1     SAB  0   TAB  0   Ectopic  0   Multiple  0   Live Births  1           Family History  Problem Relation Age of  Onset   Hypertension Brother    Hyperlipidemia Brother    Parkinsonism Father    Stroke Maternal Grandfather     Social History   Tobacco Use   Smoking status: Former Smoker    Packs/day: 0.50    Years: 20.00    Pack years: 10.00    Types: Cigarettes   Smokeless tobacco: Never Used   Tobacco comment: 'quit smoking in the 1990's?"  Substance Use Topics   Alcohol use: Yes    Alcohol/week: 0.0 - 1.0 standard drinks   Drug use: No    Home Medications Prior to Admission medications   Medication Sig Start Date End Date Taking? Authorizing Provider  amLODipine (NORVASC) 10 MG tablet Take 1 tablet (10 mg total) by mouth daily. Patient taking differently: Take 5 mg by mouth daily.  06/28/15   Thurnell Lose, MD  clopidogrel (PLAVIX) 75 MG tablet Take 1 tablet (75 mg total) by mouth daily. 06/28/15   Thurnell Lose, MD  estradiol (ESTRACE) 0.1 MG/GM vaginal cream Apply a pea sized amount once a week or just at the urethra 11/02/17   Regina Eck, CNM  fenofibrate 160 MG tablet Take 160 mg by mouth daily.    [provider]  fluticasone (FLONASE) 50 MCG/ACT nasal spray Place into both nostrils daily.    [provider]  HYDROcodone-acetaminophen (NORCO/VICODIN) 5-325 MG tablet Take 3 tablets by mouth every 6 (six) hours as needed. for pain 04/20/16   [provider]  losartan (COZAAR) 25 MG tablet  12/12/18   [provider]  mirtazapine (REMERON) 15 MG tablet Take 15 mg by mouth at bedtime.    [provider]  montelukast (SINGULAIR) 10 MG tablet Take 10 mg by mouth daily. 04/28/16   [provider]  Multiple Vitamins-Minerals (MULTIVITAMIN PO) Take by mouth.    [provider]  omeprazole (PRILOSEC) 20 MG capsule TAKE 1 CAPSULE IN THE EVENING 06/13/15   [provider]  propranolol ER (INDERAL LA) 120 MG 24 hr capsule Take 120 mg by mouth daily. 04/08/15   Reynold Bowen, MD    Allergies    Patient  has no known allergies.  Review of Systems   Review of Systems  Gastrointestinal: Positive for abdominal pain and nausea.   Ten systems are reviewed by me and are negative for acute changes, except as noted in the HPI. Physical Exam Updated Vital Signs BP (!) 212/84 (BP Location: Right Arm)    Pulse 78    Temp 98.2 F (36.8 C) (Oral)    Resp 14    Ht 5\' 2"  (1.575 m)    Wt 39 kg  LMP 11/30/1976    SpO2 100%    BMI 15.73 kg/m   Physical Exam .PE: Constitutional: well-developed, well-nourished, no apparent distress, looks thin  HENT: normocephalic, atraumatic Cardiovascular: normal rate and rhythm, distal pulses intact Pulmonary/Chest: effort normal; breath sounds clear and equal bilaterally; no wheezes or rales Abdominal: NBS. No bruits heard.  tenderness around epigastrium up to her mid sternum, no pain elsewhere. No distention. No rebounding or guarding. Negative Murphys. Negative McBurneys.  No hernia present.  Musculoskeletal: full ROM, no edema Lymphadenopathy: no cervical adenopathy Neurological: alert with goal directed thinking Skin: warm and dry, scabbed and healing shingles from left chest to back following the T10 dermatome, is tender to touch, no warmth or drainage  Psychiatric: normal mood and affect, normal behavior   ED Results / Procedures / Treatments   Labs (all labs ordered are listed, but only abnormal results are displayed) Labs Reviewed  COMPREHENSIVE METABOLIC PANEL - Abnormal; Notable for the following components:      Result Value   Sodium 134 (*)    Glucose, Bld 102 (*)    All other components within normal limits  LIPASE, BLOOD  CBC  URINALYSIS, ROUTINE W REFLEX MICROSCOPIC    EKG None  Radiology No results found.  Procedures Procedures (including critical care time)  Medications Ordered in ED Medications - No data to display  ED Course  I have reviewed the triage vital signs and the nursing notes.  Pertinent labs & imaging results  that were available during my care of the patient were reviewed by me and considered in my medical decision making (see chart for details).    MDM Rules/Calculators/A&P                      Jessica [name] is a 86 y.o. female with a pertinent past medical history of hypertension, anemia, arthritis, chronic lower back pain due to bulging disks (she is on chronic opioid medication daily).  She presents to the emergency department for epigastric pain that started 3 weeks ago.  CT abdomen and pelvis done 4/8 was unrevealing.  EKG, troponins, chest x-ray, basic blood work, lipase, KUB ordered.  Differential to include esophageal spasm, esophagitis, rupture, esophageal cancer, achalasia, gastritis, SBO, pain related to her shingles. Her pain is complicated due her to chronic opioid use.   I reviewed and interpreted labs which included stable CBC, normal Chemistry, normal lipase. First troponin nomral, no need to get second troponin at this time due to low suspicion of ACS, pt not complaining of chest pain and on reevaluation pt states she feels slightly better.  I ordered medications in the ER to include Malox with lidocaine and Bentyl.  I independently reviewed and interpreted CXR and KUB by me which showed no acute cardiopilmonary disease and negative abdominal radiographs. No life threatening disease states at this time.Pt to be discharged. I called [facility] and was able to move up her appointment to this Monday 4/19 at 3:40om with Dr. Modena Nunnery. Gave pt strong ER return precautions. Pt agreeable with discharge. Educated pt on s/e of continuous use of opioids.    Patient's blood pressure elevated in the emergency department today. Patient denies headache, change in vision, numbness, weakness, chest pain, dyspnea, dizziness, or lightheadedness therefore doubt hypertensive emergency. Discussed elevated blood pressure with the patient and the need for primary care follow up with potential need to  initiate or change antihypertensive medications and or for further evaluation. Discussed return precaution signs/symptoms for  hypertensive emergency as listed above with the patient. She confirmed understanding.     I discussed this case with PA-C Margarita Mail and my attending physician, Dr. Eulis Foster, who cosigned this note including patient's presenting symptoms, physical exam, and planned diagnostics and interventions. Attending physician stated agreement with plan or made changes to plan which were implemented. Attending physician assessed patient at bedside.      Final Clinical Impression(s) / ED Diagnoses Final diagnoses:  None    Rx / DC Orders ED Discharge Orders    None       Alfredia Client, PA-C 03/15/20 1553    Daleen Bo, MD 03/16/20 1622

## 2020-03-15 NOTE — Discharge Instructions (Addendum)
  You are seen today for abdominal pain the emergency department.  Your labs and x-rays were reassuring.  Your appointment with [facility] with Dr. Modena Nunnery has moved up to him this Monday 4/19 at 3:40 PM.  Follow-up with your PCP after this appointment.  Your blood pressure was high today, make sure you follow up with your PCP about this. Come back to the emergency department for worsening symptoms, fever, inability to swallow.

## 2020-03-18 ENCOUNTER — Ambulatory Visit (INDEPENDENT_AMBULATORY_CARE_PROVIDER_SITE_OTHER): Payer: Medicare Other | Admitting: Gastroenterology

## 2020-03-18 ENCOUNTER — Encounter: Payer: Self-pay | Admitting: Gastroenterology

## 2020-03-18 VITALS — BP 130/60 | HR 68 | Temp 98.5°F | Ht 60.5 in | Wt 85.4 lb

## 2020-03-18 DIAGNOSIS — R1013 Epigastric pain: Secondary | ICD-10-CM

## 2020-03-18 NOTE — Progress Notes (Signed)
Referring Provider: Reynold Bowen, MD Primary Care Physician:  Reynold Bowen, MD  Reason for Consultation:  Abdominal pain   IMPRESSION:  Severe, constant epigastric pain for 1 month with patient concern that she has intragastric shingles    - No source identified on contrasted CT of the abdomen and pelvis or abdominal x-rays    - Labs 03/15/2020 showed normal CMP, normal lipase, normal CBC History of anemia attributed to AVMs Prior endoscopy with Dr. Rex Kras Chronic use of Plavix  Although she did not tolerate Lyrica, I wonder if other options for postherpetic neuralgia could be considered.  Given her advanced age I encouraged her to review this with Dr. Forde Dandy to ensure safety.  PLAN: I did not change her medications today EGD after a Plavix washout  Will proceed with endoscopy. Given advanced age, comorbidities, and anticoagulants, the procedure is high risk. The nature of the procedure, as well as the risks, benefits, and alternatives were carefully and thoroughly reviewed with the patient. Ample time for discussion and questions allowed. The patient understood, was satisfied, and agreed to proceed. Hold Plavix for 5 days before endoscopy.  I discussed with the patient that there is a low, but real, risk of a cardiovascular event such as heart attack, stroke, or embolism/thrombosis while off Plavix. Will communicate by phone or EMR with patient's prescribing provider to confirm that holding the Plavix is appropriate at this time.    I spent 45 minutes, including in depth chart review, independent review of results, communicating results with the patient directly, face-to-face time with the patient, coordinating care, ordering studies and medications as appropriate, and documentation.    HPI: Jessica [name] is a 86 y.o. female referred by Dr. Forde Dandy for further evaluation of abdominal pain.  The history is obtained through the patient, review of her electronic health record, and records  obtained from Dr. Earlean Shawl.  She has chronic migraines, hypercholesterolemia, metabolic bone disease, hypertension, anemia diagnosed in 2019.  Retired from [employer] in [place].  Previously evaluated by Dr. Allyn Kenner. Was told that she could be seen here faster than establishing with the [facility] network.  One month of constant 10/10 epigastric pain that has now moved up to her sternum.  Self-treated with Prilosec and gaviscon without any relief. Only 1 week into the pain she developed developed shingles. Treated with antiviral medications and Lyrica.  Did not tolerate Lyrica.  Needed antiviral therapy.   No change in pain with defecation, eating, or movement.  Only sleeping 4 hours at night due to the pain. Long-standing anorexia but it is worse over the last month.  No sitophobia.  No dysphagia, odynophagia, or dysphonia. No numbness, pruritus, allodynia. No change in bowel habits.  No blood in stool. Pain is only relieved by hydrocodone prescribed by her orthopedic MD for pain associated with bulging disk.  Requiring Zofran for nausea attributed to the severe pain.  Seen in the ED 03/15/2020 for severe pain.  Labs at that time showed a normal CMP including normal liver enzymes.  Lipase was normal at 44.  CBC was normal.  CT abd/pelvis 03/07/20 and abdominal films 03/15/20 were normal except for multiple hepatic cysts, mild fatty infiltration of the liver, multiple small kidney cysts, sigmoid diverticulosis without diverticulitis, degenerative changes of L3-L4 and L4-L5.   Daughter lives in [place], [place].  Here temporarily given her mom symptoms.  Pain is concerned about intragastric shingles or cancer.   Daughter and brother with reflux. No known family history of  colon cancer or polyps. No family history of uterine/endometrial cancer, pancreatic cancer or gastric/stomach cancer.  Previous procedures with Dr. Earlean Shawl: Colonoscopy: 1) 2000: Adenomas 2) 2003: Diverticulosis, internal  hemorrhoids, AVM 3) 2008: Diverticulosis, internal hemorrhoids, AVM 4) 2017: Sigmoid diverticulosis, internal hemorrhoids, cecal AVMs EGD: 1) 2010: Gastritis with atypia, CLO negative, candidal esophagitis 2) 2010: Healed gastritis, no atypia, no esophagitis 3) 2014: Echo pharyngitis.  Normal EGD.  Prior abdominal imaging: 1) CT of the abdomen and pelvis was negative in 1999 and 2004 2) Abdominal ultrasound and pelvic ultrasound -2010   Past Medical History:  Diagnosis Date  . Anemia   . Arthritis    "thumbs" (06/27/2015)  . Bilateral renal artery stenosis ([facility])   . Chronic lower back pain   . GERD (gastroesophageal reflux disease)   . Headache    "probably weekly" (06/27/2015)  . Hypertension   . Hypertriglyceridemia   . Migraine    "maybe monthly" (06/27/2015)    Past Surgical History:  Procedure Laterality Date  . BALLOON DILATION  1980's?   "for renal stenosis"  . RECONSTRUCTION OF EYELID Bilateral 01/2016   Upper and lower eyelid  . RENAL ANGIOPLASTY    . TOE SURGERY Bilateral    "straightened big toe"  . TONSILLECTOMY  ~ 1941  . VAGINAL HYSTERECTOMY  1973    Current Outpatient Medications  Medication Sig Dispense Refill  . amLODipine (NORVASC) 10 MG tablet Take 1 tablet (10 mg total) by mouth daily. 30 tablet 0  . clopidogrel (PLAVIX) 75 MG tablet Take 1 tablet (75 mg total) by mouth daily. 30 tablet 0  . fenofibrate 160 MG tablet Take 160 mg by mouth daily.    Marland Kitchen HYDROcodone-acetaminophen (NORCO) 10-325 MG tablet Take 1 tablet by mouth every 6 (six) hours as needed for moderate pain.   0  . losartan (COZAAR) 100 MG tablet Take 100 mg by mouth daily.     . mirtazapine (REMERON) 15 MG tablet Take 15 mg by mouth at bedtime.    . montelukast (SINGULAIR) 10 MG tablet Take 10 mg by mouth daily.  6  . Multiple Vitamins-Minerals (MULTIVITAMIN PO) Take 1 tablet by mouth daily.     Marland Kitchen omeprazole (PRILOSEC) 20 MG capsule Take 20 mg by mouth every evening.   5  . ondansetron  (ZOFRAN) 4 MG tablet Take 4 mg by mouth every 8 (eight) hours as needed for nausea.    . propranolol ER (INDERAL LA) 120 MG 24 hr capsule Take 120 mg by mouth daily.  3  . quinapril (ACCUPRIL) 20 MG tablet Take 20 mg by mouth at bedtime.    . Vitamin D, Ergocalciferol, (DRISDOL) 1.25 MG (50000 UNIT) CAPS capsule Take 50,000 Units by mouth every 7 (seven) days.     No current facility-administered medications for this visit.    Allergies as of 03/18/2020  . (No Known Allergies)    Family History  Problem Relation Age of Onset  . Hypertension Brother   . Hyperlipidemia Brother   . Parkinsonism Father   . Stroke Maternal Grandfather     Social History   Socioeconomic History  . Marital status: Widowed    Spouse name: Not on file  . Number of children: 1  . Years of education: Not on file  . Highest education level: Not on file  Occupational History  . Occupation: retired  Tobacco Use  . Smoking status: Former Smoker    Packs/day: 0.50    Years: 20.00    Pack  years: 10.00    Types: Cigarettes  . Smokeless tobacco: Never Used  . Tobacco comment: 'quit smoking in the 1990's?"  Substance and Sexual Activity  . Alcohol use: Yes    Alcohol/week: 0.0 - 1.0 standard drinks    Comment: rarely  . Drug use: No  . Sexual activity: Yes    Partners: Male    Birth control/protection: Surgical, Post-menopausal    Comment: hysterectomy  Other Topics Concern  . Not on file  Social History Narrative  . Not on file   Social Determinants of Health   Financial Resource Strain:   . Difficulty of Paying Living Expenses:   Food Insecurity:   . Worried About Charity fundraiser in the Last Year:   . Arboriculturist in the Last Year:   Transportation Needs:   . Film/video editor (Medical):   Marland Kitchen Lack of Transportation (Non-Medical):   Physical Activity:   . Days of Exercise per Week:   . Minutes of Exercise per Session:   Stress:   . Feeling of Stress :   Social Connections:     . Frequency of Communication with Friends and Family:   . Frequency of Social Gatherings with Friends and Family:   . Attends Religious Services:   . Active Member of Clubs or Organizations:   . Attends Archivist Meetings:   Marland Kitchen Marital Status:   Intimate Partner Violence:   . Fear of Current or Ex-Partner:   . Emotionally Abused:   Marland Kitchen Physically Abused:   . Sexually Abused:     Review of Systems: 12 system ROS is negative except as noted above with the addition of back pain, headaches, rash.   Physical Exam: General:   Alert,  well-nourished, pleasant and cooperative in NAD Head:  Normocephalic and atraumatic. Eyes:  Sclera clear, no icterus.   Conjunctiva pink. Ears:  Normal auditory acuity. Nose:  No deformity, discharge,  or lesions. Mouth:  No deformity or lesions.   Neck:  Supple; no masses or thyromegaly. Lungs:  Clear throughout to auscultation.   No wheezes. Heart:  Regular rate and rhythm; no murmurs. Abdomen:  Soft, mild tenderness in the epigastrium up to the sternum, nondistended, normal bowel sounds, no rebound or guarding. No hepatosplenomegaly.   Rectal:  Deferred  Msk:  Symmetrical. No boney deformities LAD: No inguinal or umbilical LAD Extremities:  No clubbing or edema. Neurologic:  Alert and  oriented x4;  grossly nonfocal Skin: Crusted skin lesions mid T6-T7 dermatome Psych:  Alert and cooperative. Normal mood and affect.    Makari Sanko L. Tarri Glenn, MD, MPH 03/18/2020, 4:05 PM

## 2020-03-18 NOTE — Patient Instructions (Signed)
You have been scheduled for an endoscopy. Please follow written instructions given to you at your visit today. If you use inhalers (even only as needed), please bring them with you on the day of your procedure.  I value your feedback and thank you for entrusting [place] with your care. If you get a [facility] patient survey, I would appreciate you taking the time to let us know about your experience today. Thank you!   Due to recent changes in healthcare laws, you may see the results of your imaging and laboratory studies on MyChart before your provider has had a chance to review them.  We understand that in some cases there may be results that are confusing or concerning to you. Not all laboratory results come back in the same time frame and the provider may be waiting for multiple results in order to interpret others.  Please give [place] 48 hours in order for your provider to thoroughly review all the results before contacting the office for clarification of your results.

## 2020-03-19 ENCOUNTER — Telehealth: Payer: Self-pay | Admitting: Emergency Medicine

## 2020-03-19 NOTE — Telephone Encounter (Signed)
   Jessica [name] 1932-08-28 [id number]  Dear Dr Forde Dandy:  We have scheduled the above named patient for a(n) endoscopy procedure. Our records show that (s)he is on anticoagulation therapy.  Please advise as to whether the patient may come off their therapy of Plavix 5 days prior to their procedure which is scheduled for 03-22-20.  Please route your response to Tinnie Gens, CMA or fax response to [phone].  Sincerely,    [facility]

## 2020-03-19 NOTE — Telephone Encounter (Signed)
Noted. Thank you, pt aware, she was told at her appointment to hold Plavix up until her procedure then resume afterwards as directed at discharge.

## 2020-03-19 NOTE — Telephone Encounter (Signed)
Dr Baldwin Crown office called approving the request to hold Plavix for 5 days

## 2020-03-21 ENCOUNTER — Ambulatory Visit: Payer: Medicare Other | Admitting: Nurse Practitioner

## 2020-03-22 ENCOUNTER — Ambulatory Visit (AMBULATORY_SURGERY_CENTER): Payer: Medicare Other | Admitting: Gastroenterology

## 2020-03-22 ENCOUNTER — Encounter: Payer: Self-pay | Admitting: Gastroenterology

## 2020-03-22 ENCOUNTER — Other Ambulatory Visit: Payer: Self-pay

## 2020-03-22 VITALS — BP 126/66 | HR 67 | Temp 95.5°F | Resp 21 | Ht 60.0 in | Wt 85.0 lb

## 2020-03-22 DIAGNOSIS — B9681 Helicobacter pylori [H. pylori] as the cause of diseases classified elsewhere: Secondary | ICD-10-CM | POA: Diagnosis not present

## 2020-03-22 DIAGNOSIS — K2950 Unspecified chronic gastritis without bleeding: Secondary | ICD-10-CM

## 2020-03-22 DIAGNOSIS — R1013 Epigastric pain: Secondary | ICD-10-CM

## 2020-03-22 DIAGNOSIS — I10 Essential (primary) hypertension: Secondary | ICD-10-CM | POA: Diagnosis not present

## 2020-03-22 DIAGNOSIS — K219 Gastro-esophageal reflux disease without esophagitis: Secondary | ICD-10-CM | POA: Diagnosis not present

## 2020-03-22 DIAGNOSIS — K449 Diaphragmatic hernia without obstruction or gangrene: Secondary | ICD-10-CM

## 2020-03-22 MED ORDER — OMEPRAZOLE 40 MG PO CPDR: 40.0000 mg | DELAYED_RELEASE_CAPSULE | Freq: Two times a day (BID) | ORAL | 3 refills | Status: DC

## 2020-03-22 MED ORDER — SODIUM CHLORIDE 0.9 % IV SOLN: 500.0000 mL | INTRAVENOUS | Status: DC

## 2020-03-22 NOTE — Progress Notes (Signed)
Called to room to assist during endoscopic procedure.  Patient ID and intended procedure confirmed with present staff. Received instructions for my participation in the procedure from the performing physician.  

## 2020-03-22 NOTE — Patient Instructions (Signed)
YOU HAD AN ENDOSCOPIC PROCEDURE TODAY AT THE [facility]:   Refer to the procedure report that was given to you for any specific questions about what was found during the examination.  If the procedure report does not answer your questions, please call your gastroenterologist to clarify.  If you requested that your care partner not be given the details of your procedure findings, then the procedure report has been included in a sealed envelope for you to review at your convenience later.  YOU SHOULD EXPECT: Some feelings of bloating in the abdomen. Passage of more gas than usual.  Walking can help get rid of the air that was put into your GI tract during the procedure and reduce the bloating. If you had a lower endoscopy (such as a colonoscopy or flexible sigmoidoscopy) you may notice spotting of blood in your stool or on the toilet paper. If you underwent a bowel prep for your procedure, you may not have a normal bowel movement for a few days.  Please Note:  You might notice some irritation and congestion in your nose or some drainage.  This is from the oxygen used during your procedure.  There is no need for concern and it should clear up in a day or so.  SYMPTOMS TO REPORT IMMEDIATELY:    Following upper endoscopy (EGD)  Vomiting of blood or coffee ground material  New chest pain or pain under the shoulder blades  Painful or persistently difficult swallowing  New shortness of breath  Fever of 100F or higher  Black, tarry-looking stools  For urgent or emergent issues, a gastroenterologist can be reached at any hour by calling [phone]. Do not use MyChart messaging for urgent concerns.    DIET:  We do recommend a small meal at first, but then you may proceed to your regular diet.  Drink plenty of fluids but you should avoid alcoholic beverages for 24 hours.  ACTIVITY:  You should plan to take it easy for the rest of today and you should NOT DRIVE or use heavy machinery  until tomorrow (because of the sedation medicines used during the test).    FOLLOW UP: Our staff will call the number listed on your records 48-72 hours following your procedure to check on you and address any questions or concerns that you may have regarding the information given to you following your procedure. If we do not reach you, we will leave a message.  We will attempt to reach you two times.  During this call, we will ask if you have developed any symptoms of COVID 19. If you develop any symptoms (ie: fever, flu-like symptoms, shortness of breath, cough etc.) before then, please call [phone].  If you test positive for Covid 19 in the 2 weeks post procedure, please call and report this information to us.    If any biopsies were taken you will be contacted by phone or by letter within the next 1-3 weeks.  Please call us at [phone] if you have not heard about the biopsies in 3 weeks.    SIGNATURES/CONFIDENTIALITY: You and/or your care partner have signed paperwork which will be entered into your electronic medical record.  These signatures attest to the fact that that the information above on your After Visit Summary has been reviewed and is understood.  Full responsibility of the confidentiality of this discharge information lies with you and/or your care-partner. 

## 2020-03-22 NOTE — Progress Notes (Signed)
Report to PACU, RN, vss, BBS= Clear.  

## 2020-03-22 NOTE — Op Note (Addendum)
[facility] Patient Name: Jessica [name] Procedure Date: 03/22/2020 1:29 PM MRN: [id number] Endoscopist: Thornton Park MD, MD Age: 86 Referring MD:  Date of Birth: 30-Jul-1932 Gender: Female Account #: [id number] Procedure:                Upper GI endoscopy Indications:              Severe, constant epigastric pain for 1 month with                            patient concern that she has "intragastric" shingles                           - No source identified on contrasted CT of the                            abdomen and pelvis or abdominal x-rays                           - Labs 03/15/2020 showed normal CMP, normal lipase,                            normal CBC                           History of anemia attributed to AVMs                           Prior endoscopy with Dr. Rex Kras                           Chronic use of Plavix Medicines:                Monitored Anesthesia Care Procedure:                Pre-Anesthesia Assessment:                           - Prior to the procedure, a History and Physical                            was performed, and patient medications and                            allergies were reviewed. The patient's tolerance of                            previous anesthesia was also reviewed. The risks                            and benefits of the procedure and the sedation                            options and risks were discussed with the patient.  All questions were answered, and informed consent                            was obtained. Prior Anticoagulants: The patient has                            taken Plavix (clopidogrel), last dose was 5 days                            prior to procedure. ASA Grade Assessment: III - A                            patient with severe systemic disease. After                            reviewing the risks and benefits, the patient was                            deemed in satisfactory  condition to undergo the                            procedure.                           After obtaining informed consent, the endoscope was                            passed under direct vision. Throughout the                            procedure, the patient's blood pressure, pulse, and                            oxygen saturations were monitored continuously. The                            Endoscope was introduced through the mouth, and                            advanced to the third part of duodenum. The upper                            GI endoscopy was accomplished without difficulty.                            The patient tolerated the procedure well. Scope In: Scope Out: Findings:                 The Z-line was irregular and was found 38 cm from                            the incisors. The mucosa just distal to the z-line  is slightly verrucous appearing. Biopsies were                            taken with a cold forceps for histology. Estimated                            blood loss was minimal.                           Diffuse mild inflammation characterized by                            erythema, friability and granularity was found in                            the gastric body and in the gastric antrum.                            Biopsies were taken from the antrum, body, and                            fundus with a cold forceps for histology. Estimated                            blood loss was minimal.                           A small hiatal hernia was present.                           The examined duodenum was normal.                           The exam was otherwise without abnormality. Complications:            No immediate complications. Estimated blood loss:                            Minimal. Estimated Blood Loss:     Estimated blood loss was minimal. Impression:               - Z-line irregular, 38 cm from the incisors.                             Biopsied.                           - Gastritis. Biopsied.                           - Small hiatal hernia.                           - Normal examined duodenum.                           - The examination was otherwise normal. Recommendation:           -  Patient has a contact number available for                            emergencies. The signs and symptoms of potential                            delayed complications were discussed with the                            patient. Return to normal activities tomorrow.                            Written discharge instructions were provided to the                            patient.                           - Resume previous diet.                           - Continue present medications. Increase omeprazole                            to 40 mg BID.                           - Resume Plavix tomorrow.                           - No aspirin, ibuprofen, naproxen, or other                            non-steroidal anti-inflammatory drugs.                           - Await pathology results. Thornton Park MD, MD 03/22/2020 1:45:56 PM This report has been signed electronically.

## 2020-03-22 NOTE — Progress Notes (Signed)
Temp by JB and vitals by DT 

## 2020-03-26 ENCOUNTER — Telehealth: Payer: Self-pay

## 2020-03-26 ENCOUNTER — Telehealth: Payer: Self-pay | Admitting: *Deleted

## 2020-03-26 NOTE — Telephone Encounter (Signed)
  Follow up Call-  Call back number 03/22/2020  Post procedure Call Back phone  # [phone]  Permission to leave phone message Yes  Some recent data might be hidden     Patient questions:  Do you have a fever, pain , or abdominal swelling? No. Pain Score  0 *  Have you tolerated food without any problems? Yes.    Have you been able to return to your normal activities? Yes.    Do you have any questions about your discharge instructions: Diet   No. Medications  No. Follow up visit  No.  Do you have questions or concerns about your Care? No.  Actions: * If pain score is 4 or above: 1. No action needed, pain <4.Have you developed a fever since your procedure? no  2.   Have you had an respiratory symptoms (SOB or cough) since your procedure? no  3.   Have you tested positive for COVID 19 since your procedure no  4.   Have you had any family members/close contacts diagnosed with the COVID 19 since your procedure?  no   If yes to any of these questions please route to Joylene John, RN and Erenest Rasher, RN

## 2020-03-26 NOTE — Telephone Encounter (Signed)
No answer for post procedure call. Left message for patient to call with questions or concerns. 

## 2020-03-27 ENCOUNTER — Encounter: Payer: Self-pay | Admitting: Gastroenterology

## 2020-03-29 ENCOUNTER — Telehealth: Payer: Self-pay | Admitting: Gastroenterology

## 2020-03-29 NOTE — Telephone Encounter (Signed)
Patient notified of the results per the letter from 4/28.  She continues to c/o "esophagus pain". She will continue the omeprazole as ordered and call back for any additional questions or concerns.

## 2020-04-04 DIAGNOSIS — E871 Hypo-osmolality and hyponatremia: Secondary | ICD-10-CM | POA: Diagnosis not present

## 2020-04-04 DIAGNOSIS — B0229 Other postherpetic nervous system involvement: Secondary | ICD-10-CM | POA: Diagnosis not present

## 2020-04-04 DIAGNOSIS — I700 Atherosclerosis of aorta: Secondary | ICD-10-CM | POA: Diagnosis not present

## 2020-04-04 DIAGNOSIS — N281 Cyst of kidney, acquired: Secondary | ICD-10-CM | POA: Diagnosis not present

## 2020-04-04 DIAGNOSIS — K551 Chronic vascular disorders of intestine: Secondary | ICD-10-CM | POA: Diagnosis not present

## 2020-04-04 DIAGNOSIS — R1013 Epigastric pain: Secondary | ICD-10-CM | POA: Diagnosis not present

## 2020-04-04 DIAGNOSIS — R627 Adult failure to thrive: Secondary | ICD-10-CM | POA: Diagnosis not present

## 2020-04-04 DIAGNOSIS — Z1389 Encounter for screening for other disorder: Secondary | ICD-10-CM | POA: Diagnosis not present

## 2020-04-04 DIAGNOSIS — G894 Chronic pain syndrome: Secondary | ICD-10-CM | POA: Diagnosis not present

## 2020-04-04 DIAGNOSIS — M5416 Radiculopathy, lumbar region: Secondary | ICD-10-CM | POA: Diagnosis not present

## 2020-04-04 DIAGNOSIS — K7689 Other specified diseases of liver: Secondary | ICD-10-CM | POA: Diagnosis not present

## 2020-04-04 DIAGNOSIS — I5189 Other ill-defined heart diseases: Secondary | ICD-10-CM | POA: Diagnosis not present

## 2020-04-11 DIAGNOSIS — L858 Other specified epidermal thickening: Secondary | ICD-10-CM | POA: Diagnosis not present

## 2020-04-11 DIAGNOSIS — Z85828 Personal history of other malignant neoplasm of skin: Secondary | ICD-10-CM | POA: Diagnosis not present

## 2020-04-23 DIAGNOSIS — R0789 Other chest pain: Secondary | ICD-10-CM | POA: Diagnosis not present

## 2020-04-23 DIAGNOSIS — R0602 Shortness of breath: Secondary | ICD-10-CM | POA: Diagnosis not present

## 2020-05-01 ENCOUNTER — Other Ambulatory Visit: Payer: Self-pay | Admitting: *Deleted

## 2020-05-01 DIAGNOSIS — R1010 Upper abdominal pain, unspecified: Secondary | ICD-10-CM

## 2020-05-06 ENCOUNTER — Ambulatory Visit (HOSPITAL_COMMUNITY)
Admission: RE | Admit: 2020-05-06 | Discharge: 2020-05-06 | Disposition: A | Discharge status: Home / Self Care | Payer: Medicare Other | Source: Ambulatory Visit | Attending: Surgery | Admitting: Surgery

## 2020-05-06 ENCOUNTER — Other Ambulatory Visit: Payer: Self-pay

## 2020-05-06 DIAGNOSIS — R1010 Upper abdominal pain, unspecified: Secondary | ICD-10-CM | POA: Insufficient documentation

## 2020-05-07 ENCOUNTER — Encounter: Payer: Self-pay | Admitting: Vascular Surgery

## 2020-05-07 ENCOUNTER — Other Ambulatory Visit: Payer: Self-pay

## 2020-05-07 ENCOUNTER — Ambulatory Visit (INDEPENDENT_AMBULATORY_CARE_PROVIDER_SITE_OTHER): Payer: Medicare Other | Admitting: Vascular Surgery

## 2020-05-07 DIAGNOSIS — K551 Chronic vascular disorders of intestine: Secondary | ICD-10-CM | POA: Diagnosis not present

## 2020-05-07 NOTE — Progress Notes (Signed)
Patient name: Jessica [name] MRN: [id number] DOB: 06-23-1932 Sex: female  REASON FOR CONSULT: Evaluate for mesenteric ischemia  HPI: Jessica [name] is a 86 y.o. female, with history of hyperlipidemia who presents for evaluation of epigastric pain and possible mesenteric ischemia.  Patient reports about 3 months of epigastric abdominal pain.  Ultimately she had EGD on 03/22/2020 that showed diffuse inflammation of the gastric body and antrum and her pathology was consistent with chronic gastritis with reactive changes.  She subsequently had a CT as well on 03/07/2020 and on my review there is some distal aortoiliac disease and looks like she has some atherosclerotic disease at both the SMA and celiac origin.  She was sent for mesenteric duplex that showed concern for high-grade SMA stenosis.  She reports she has been compliant with her omeprazole over the last 3 months and is seeing no improvement.  States she does not look forward to eating and does not have much of an appetite.  Has lost about 10 pounds and is only about 80 pounds at this time.  She reports previous renal artery angioplasty years ago.  Past Medical History:  Diagnosis Date  . Anemia   . Arthritis    "thumbs" (06/27/2015)  . Bilateral renal artery stenosis ([place])   . Cataract   . Chronic bronchitis ([place])   . Chronic lower back pain   . GERD (gastroesophageal reflux disease)   . Headache    "probably weekly" (06/27/2015)  . Hypertension   . Hypertriglyceridemia   . Migraine    "maybe monthly" (06/27/2015)    Past Surgical History:  Procedure Laterality Date  . BALLOON DILATION  1980's?   "for renal stenosis"  . CATARACT EXTRACTION, BILATERAL    . RECONSTRUCTION OF EYELID Bilateral 01/2016   Upper and lower eyelid  . RENAL ANGIOPLASTY    . TOE SURGERY Bilateral    "straightened big toe"  . TONSILLECTOMY  ~ 1941  . VAGINAL HYSTERECTOMY  1973    Family History  Problem Relation Age of Onset  . Hypertension Brother   .  Hyperlipidemia Brother   . Parkinsonism Father   . Stroke Maternal Grandfather   . Colon cancer Neg Hx   . Colon polyps Neg Hx   . Stomach cancer Neg Hx     SOCIAL HISTORY: Social History   Socioeconomic History  . Marital status: Widowed    Spouse name: Not on file  . Number of children: 1  . Years of education: Not on file  . Highest education level: Not on file  Occupational History  . Occupation: retired  Tobacco Use  . Smoking status: Former Smoker    Packs/day: 0.50    Years: 20.00    Pack years: 10.00    Types: Cigarettes  . Smokeless tobacco: Never Used  . Tobacco comment: 'quit smoking in the 1990's?"  Substance and Sexual Activity  . Alcohol use: Yes    Alcohol/week: 0.0 - 1.0 standard drinks    Comment: rarely  . Drug use: No  . Sexual activity: Yes    Partners: Male    Birth control/protection: Surgical, Post-menopausal    Comment: hysterectomy  Other Topics Concern  . Not on file  Social History Narrative  . Not on file   Social Determinants of Health   Financial Resource Strain:   . Difficulty of Paying Living Expenses:   Food Insecurity:   . Worried About Charity fundraiser in the Last Year:   .  Ran Out of Food in the Last Year:   Transportation Needs:   . Film/video editor (Medical):   Marland Kitchen Lack of Transportation (Non-Medical):   Physical Activity:   . Days of Exercise per Week:   . Minutes of Exercise per Session:   Stress:   . Feeling of Stress :   Social Connections:   . Frequency of Communication with Friends and Family:   . Frequency of Social Gatherings with Friends and Family:   . Attends Religious Services:   . Active Member of Clubs or Organizations:   . Attends Archivist Meetings:   Marland Kitchen Marital Status:   Intimate Partner Violence:   . Fear of Current or Ex-Partner:   . Emotionally Abused:   Marland Kitchen Physically Abused:   . Sexually Abused:     No Known Allergies  Current Outpatient Medications  Medication Sig  Dispense Refill  . amLODipine (NORVASC) 10 MG tablet Take 1 tablet (10 mg total) by mouth daily. 30 tablet 0  . clopidogrel (PLAVIX) 75 MG tablet Take 1 tablet (75 mg total) by mouth daily. 30 tablet 0  . fenofibrate 160 MG tablet Take 160 mg by mouth daily.    Marland Kitchen HYDROcodone-acetaminophen (NORCO) 10-325 MG tablet Take 1 tablet by mouth every 6 (six) hours as needed for moderate pain.   0  . losartan (COZAAR) 100 MG tablet Take 100 mg by mouth daily.     . mirtazapine (REMERON) 15 MG tablet Take 15 mg by mouth at bedtime.    . montelukast (SINGULAIR) 10 MG tablet Take 10 mg by mouth daily.  6  . Multiple Vitamins-Minerals (MULTIVITAMIN PO) Take 1 tablet by mouth daily.     Marland Kitchen omeprazole (PRILOSEC) 40 MG capsule Take 1 capsule (40 mg total) by mouth in the morning and at bedtime. 90 capsule 3  . ondansetron (ZOFRAN) 4 MG tablet Take 4 mg by mouth every 8 (eight) hours as needed for nausea.    . propranolol ER (INDERAL LA) 120 MG 24 hr capsule Take 120 mg by mouth daily.  3  . omeprazole (PRILOSEC) 20 MG capsule Take 20 mg by mouth every evening.   5  . Vitamin D, Ergocalciferol, (DRISDOL) 1.25 MG (50000 UNIT) CAPS capsule Take 50,000 Units by mouth every 7 (seven) days.     No current facility-administered medications for this visit.    REVIEW OF SYSTEMS:  [X]  denotes positive finding, [ ]  denotes negative finding Cardiac  Comments:  Chest pain or chest pressure:    Shortness of breath upon exertion:    Short of breath when lying flat:    Irregular heart rhythm:        Vascular    Pain in calf, thigh, or hip brought on by ambulation:    Pain in feet at night that wakes you up from your sleep:     Blood clot in your veins:    Leg swelling:         Pulmonary    Oxygen at home:    Productive cough:     Wheezing:         Neurologic    Sudden weakness in arms or legs:     Sudden numbness in arms or legs:     Sudden onset of difficulty speaking or slurred speech:    Temporary loss of  vision in one eye:     Problems with dizziness:         Gastrointestinal    Blood  in stool:     Vomited blood:         Genitourinary    Burning when urinating:     Blood in urine:        Psychiatric    Major depression:         Hematologic    Bleeding problems:    Problems with blood clotting too easily:        Skin    Rashes or ulcers:        Constitutional    Fever or chills:      PHYSICAL EXAM: Vitals:   05/07/20 0955  BP: 118/72  Pulse: 70  Resp: 14  Temp: (!) 96.6 F (35.9 C)  TempSrc: Temporal  SpO2: 98%  Weight: 80 lb (36.3 kg)  Height: 5\' 1"  (1.549 m)    GENERAL: The patient is a well-nourished female, in no acute distress. The vital signs are documented above. CARDIAC: There is a regular rate and rhythm.  VASCULAR:  Palpable femoral pulses bilaterally PULMONARY: There is good air exchange bilaterally without wheezing or rales. ABDOMEN: Soft and non-tender with normal pitched bowel sounds. Very thin abdomen. MUSCULOSKELETAL: There are no major deformities or cyanosis. NEUROLOGIC: No focal weakness or paresthesias are detected. SKIN: There are no ulcers or rashes noted. PSYCHIATRIC: The patient has a normal affect.  DATA:   Mesenteric duplex with concern for 70 to 90% stenosis in this SMA and celiac artery.  Assessment/Plan:  86 year old female presents for evaluation of chronic mesenteric ischemia with 3 months of epigastric abdominal pain.  I discussed with her that based on the mesenteric duplex there was findings concerning for high-grade stenosis of both the SMA and the celiac artery.  In review of her CT scan certainly appears to have more significant celiac artery disease than in the SMA, but I think it would be appropriate to do a mesenteric arteriogram to confirm.  Discussed that if her mesenteric arteriogram does not show any high-grade stenosis this would just be a diagnostic study but could also intervene with possible mesenteric stent  placement at the same time.  Certainly her gastritis could be the etiology for her ongoing epigastric pain and weight loss given the findings on EGD which I discussed with her family.  The concern is that she has shown no improvement after 3 months of medical therapy.  We discussed risk and benefits in detail.   Marty Heck, MD [facility] Office: [phone]

## 2020-05-09 DIAGNOSIS — J019 Acute sinusitis, unspecified: Secondary | ICD-10-CM | POA: Diagnosis not present

## 2020-05-09 DIAGNOSIS — K2970 Gastritis, unspecified, without bleeding: Secondary | ICD-10-CM | POA: Diagnosis not present

## 2020-05-09 DIAGNOSIS — R1013 Epigastric pain: Secondary | ICD-10-CM | POA: Diagnosis not present

## 2020-05-09 DIAGNOSIS — B0229 Other postherpetic nervous system involvement: Secondary | ICD-10-CM | POA: Diagnosis not present

## 2020-05-14 ENCOUNTER — Encounter: Payer: Medicare Other | Admitting: Vascular Surgery

## 2020-05-14 ENCOUNTER — Encounter (HOSPITAL_COMMUNITY): Payer: Medicare Other

## 2020-05-14 DIAGNOSIS — R1013 Epigastric pain: Secondary | ICD-10-CM | POA: Diagnosis not present

## 2020-05-14 DIAGNOSIS — E7849 Other hyperlipidemia: Secondary | ICD-10-CM | POA: Diagnosis not present

## 2020-05-14 DIAGNOSIS — Z1331 Encounter for screening for depression: Secondary | ICD-10-CM | POA: Diagnosis not present

## 2020-05-14 DIAGNOSIS — E871 Hypo-osmolality and hyponatremia: Secondary | ICD-10-CM | POA: Diagnosis not present

## 2020-05-14 DIAGNOSIS — I10 Essential (primary) hypertension: Secondary | ICD-10-CM | POA: Diagnosis not present

## 2020-05-14 DIAGNOSIS — K551 Chronic vascular disorders of intestine: Secondary | ICD-10-CM | POA: Diagnosis not present

## 2020-05-14 DIAGNOSIS — R7301 Impaired fasting glucose: Secondary | ICD-10-CM | POA: Diagnosis not present

## 2020-05-15 ENCOUNTER — Encounter (HOSPITAL_COMMUNITY)
Admission: EM | Disposition: A | Discharge status: Home / Self Care | Payer: Self-pay | Source: Home / Self Care | Attending: Vascular Surgery

## 2020-05-15 ENCOUNTER — Ambulatory Visit (HOSPITAL_COMMUNITY): Payer: Medicare Other | Admitting: Anesthesiology

## 2020-05-15 ENCOUNTER — Encounter (HOSPITAL_COMMUNITY): Payer: Self-pay | Admitting: Vascular Surgery

## 2020-05-15 ENCOUNTER — Inpatient Hospital Stay (HOSPITAL_COMMUNITY)
Admission: EM | Admit: 2020-05-15 | Discharge: 2020-05-17 | DRG: 271 | Disposition: A | Discharge status: Home / Self Care | Payer: Medicare Other | Attending: Vascular Surgery | Admitting: Vascular Surgery

## 2020-05-15 ENCOUNTER — Other Ambulatory Visit: Payer: Self-pay

## 2020-05-15 ENCOUNTER — Ambulatory Visit (HOSPITAL_COMMUNITY): Payer: Medicare Other

## 2020-05-15 DIAGNOSIS — I998 Other disorder of circulatory system: Secondary | ICD-10-CM

## 2020-05-15 DIAGNOSIS — E785 Hyperlipidemia, unspecified: Secondary | ICD-10-CM | POA: Diagnosis not present

## 2020-05-15 DIAGNOSIS — K219 Gastro-esophageal reflux disease without esophagitis: Secondary | ICD-10-CM | POA: Diagnosis not present

## 2020-05-15 DIAGNOSIS — Z820 Family history of epilepsy and other diseases of the nervous system: Secondary | ICD-10-CM | POA: Diagnosis not present

## 2020-05-15 DIAGNOSIS — I10 Essential (primary) hypertension: Secondary | ICD-10-CM | POA: Diagnosis present

## 2020-05-15 DIAGNOSIS — T82868A Thrombosis of vascular prosthetic devices, implants and grafts, initial encounter: Secondary | ICD-10-CM | POA: Diagnosis not present

## 2020-05-15 DIAGNOSIS — K551 Chronic vascular disorders of intestine: Secondary | ICD-10-CM | POA: Diagnosis present

## 2020-05-15 DIAGNOSIS — Z823 Family history of stroke: Secondary | ICD-10-CM

## 2020-05-15 DIAGNOSIS — Z83438 Family history of other disorder of lipoprotein metabolism and other lipidemia: Secondary | ICD-10-CM

## 2020-05-15 DIAGNOSIS — Z8249 Family history of ischemic heart disease and other diseases of the circulatory system: Secondary | ICD-10-CM

## 2020-05-15 DIAGNOSIS — Z20822 Contact with and (suspected) exposure to covid-19: Secondary | ICD-10-CM | POA: Diagnosis not present

## 2020-05-15 DIAGNOSIS — I70221 Atherosclerosis of native arteries of extremities with rest pain, right leg: Secondary | ICD-10-CM | POA: Diagnosis not present

## 2020-05-15 DIAGNOSIS — I774 Celiac artery compression syndrome: Secondary | ICD-10-CM | POA: Diagnosis not present

## 2020-05-15 DIAGNOSIS — Y848 Other medical procedures as the cause of abnormal reaction of the patient, or of later complication, without mention of misadventure at the time of the procedure: Secondary | ICD-10-CM | POA: Diagnosis present

## 2020-05-15 DIAGNOSIS — K2950 Unspecified chronic gastritis without bleeding: Secondary | ICD-10-CM | POA: Diagnosis not present

## 2020-05-15 HISTORY — PX: VISCERAL ANGIOGRAPHY: CATH118276

## 2020-05-15 HISTORY — PX: PERIPHERAL VASCULAR INTERVENTION: CATH118257

## 2020-05-15 HISTORY — PX: PATCH ANGIOPLASTY: SHX6230

## 2020-05-15 HISTORY — PX: ENDARTERECTOMY FEMORAL: SHX5804

## 2020-05-15 LAB — POCT ACTIVATED CLOTTING TIME: Activated Clotting Time: 158 seconds

## 2020-05-15 LAB — POCT I-STAT, CHEM 8
BUN: 26 mg/dL — ABNORMAL HIGH (ref 8–23)
Calcium, Ion: 1.23 mmol/L (ref 1.15–1.40)
Chloride: 99 mmol/L (ref 98–111)
Creatinine, Ser: 0.8 mg/dL (ref 0.44–1.00)
Glucose, Bld: 96 mg/dL (ref 70–99)
HCT: 40 % (ref 36.0–46.0)
Hemoglobin: 13.6 g/dL (ref 12.0–15.0)
Potassium: 4.1 mmol/L (ref 3.5–5.1)
Sodium: 139 mmol/L (ref 135–145)
TCO2: 30 mmol/L (ref 22–32)

## 2020-05-15 LAB — SARS CORONAVIRUS 2 BY RT PCR (HOSPITAL ORDER, PERFORMED IN ~~LOC~~ HOSPITAL LAB): SARS Coronavirus 2: NEGATIVE

## 2020-05-15 SURGERY — VISCERAL ANGIOGRAPHY
Anesthesia: LOCAL

## 2020-05-15 SURGERY — ENDARTERECTOMY, FEMORAL
Anesthesia: General | Site: Groin | Laterality: Right

## 2020-05-15 MED ORDER — SODIUM CHLORIDE 0.9 % IV SOLN: INTRAVENOUS | Status: DC

## 2020-05-15 MED ORDER — PROPOFOL 10 MG/ML IV BOLUS
INTRAVENOUS | Status: AC
  Filled 2020-05-15: qty 20

## 2020-05-15 MED ORDER — DEXAMETHASONE SODIUM PHOSPHATE 10 MG/ML IJ SOLN
INTRAMUSCULAR | Status: DC | PRN
  Administered 2020-05-15: 5 mg via INTRAVENOUS

## 2020-05-15 MED ORDER — MONTELUKAST SODIUM 10 MG PO TABS
10.0000 mg | ORAL_TABLET | Freq: Every day | ORAL | Status: DC
  Administered 2020-05-15 – 2020-05-17 (×3): 10 mg via ORAL
  Filled 2020-05-15 (×3): qty 1

## 2020-05-15 MED ORDER — ALUM & MAG HYDROXIDE-SIMETH 200-200-20 MG/5ML PO SUSP: 15.0000 mL | ORAL | Status: DC | PRN

## 2020-05-15 MED ORDER — LOSARTAN POTASSIUM 50 MG PO TABS
100.0000 mg | ORAL_TABLET | Freq: Every day | ORAL | Status: DC
  Administered 2020-05-15 – 2020-05-17 (×3): 100 mg via ORAL
  Filled 2020-05-15 (×3): qty 2

## 2020-05-15 MED ORDER — SUGAMMADEX SODIUM 200 MG/2ML IV SOLN
INTRAVENOUS | Status: DC | PRN
  Administered 2020-05-15: 80 mg via INTRAVENOUS

## 2020-05-15 MED ORDER — ONDANSETRON HCL 4 MG/2ML IJ SOLN
INTRAMUSCULAR | Status: AC
  Filled 2020-05-15: qty 2

## 2020-05-15 MED ORDER — ROCURONIUM BROMIDE 10 MG/ML (PF) SYRINGE
PREFILLED_SYRINGE | INTRAVENOUS | Status: AC
  Filled 2020-05-15: qty 10

## 2020-05-15 MED ORDER — LIDOCAINE HCL (PF) 1 % IJ SOLN
INTRAMUSCULAR | Status: DC | PRN
  Administered 2020-05-15: 15 mL via INTRADERMAL

## 2020-05-15 MED ORDER — METOPROLOL TARTRATE 5 MG/5ML IV SOLN: 2.0000 mg | INTRAVENOUS | Status: DC | PRN

## 2020-05-15 MED ORDER — HEPARIN (PORCINE) IN NACL 1000-0.9 UT/500ML-% IV SOLN
INTRAVENOUS | Status: AC
  Filled 2020-05-15: qty 1000

## 2020-05-15 MED ORDER — CHLORHEXIDINE GLUCONATE CLOTH 2 % EX PADS
6.0000 | MEDICATED_PAD | Freq: Every day | CUTANEOUS | Status: DC
  Administered 2020-05-15 – 2020-05-17 (×2): 6 via TOPICAL

## 2020-05-15 MED ORDER — DEXAMETHASONE SODIUM PHOSPHATE 10 MG/ML IJ SOLN
INTRAMUSCULAR | Status: AC
  Filled 2020-05-15: qty 1

## 2020-05-15 MED ORDER — FENTANYL CITRATE (PF) 100 MCG/2ML IJ SOLN
25.0000 ug | INTRAMUSCULAR | Status: DC | PRN
  Administered 2020-05-15 (×2): 25 ug via INTRAVENOUS

## 2020-05-15 MED ORDER — FENOFIBRATE 160 MG PO TABS
160.0000 mg | ORAL_TABLET | Freq: Every day | ORAL | Status: DC
  Administered 2020-05-16 – 2020-05-17 (×2): 160 mg via ORAL
  Filled 2020-05-15 (×2): qty 1

## 2020-05-15 MED ORDER — GUAIFENESIN-DM 100-10 MG/5ML PO SYRP: 15.0000 mL | ORAL_SOLUTION | ORAL | Status: DC | PRN

## 2020-05-15 MED ORDER — MORPHINE SULFATE (PF) 2 MG/ML IV SOLN: 2.0000 mg | INTRAVENOUS | Status: DC | PRN

## 2020-05-15 MED ORDER — SODIUM CHLORIDE 0.9% FLUSH: 3.0000 mL | Freq: Two times a day (BID) | INTRAVENOUS | Status: DC

## 2020-05-15 MED ORDER — HYDRALAZINE HCL 20 MG/ML IJ SOLN: 5.0000 mg | INTRAMUSCULAR | Status: DC | PRN

## 2020-05-15 MED ORDER — PROTAMINE SULFATE 10 MG/ML IV SOLN
INTRAVENOUS | Status: AC
  Filled 2020-05-15: qty 5

## 2020-05-15 MED ORDER — 0.9 % SODIUM CHLORIDE (POUR BTL) OPTIME
TOPICAL | Status: DC | PRN
  Administered 2020-05-15: 1000 mL

## 2020-05-15 MED ORDER — MOMETASONE FURO-FORMOTEROL FUM 100-5 MCG/ACT IN AERO
2.0000 | INHALATION_SPRAY | Freq: Two times a day (BID) | RESPIRATORY_TRACT | Status: DC
  Administered 2020-05-15 – 2020-05-17 (×4): 2 via RESPIRATORY_TRACT
  Filled 2020-05-15: qty 8.8

## 2020-05-15 MED ORDER — PHENYLEPHRINE 40 MCG/ML (10ML) SYRINGE FOR IV PUSH (FOR BLOOD PRESSURE SUPPORT)
PREFILLED_SYRINGE | INTRAVENOUS | Status: DC | PRN
  Administered 2020-05-15: 40 ug via INTRAVENOUS
  Administered 2020-05-15 (×2): 80 ug via INTRAVENOUS
  Administered 2020-05-15: 40 ug via INTRAVENOUS
  Administered 2020-05-15 (×2): 80 ug via INTRAVENOUS

## 2020-05-15 MED ORDER — ONDANSETRON HCL 4 MG/2ML IJ SOLN: 4.0000 mg | Freq: Four times a day (QID) | INTRAMUSCULAR | Status: DC | PRN

## 2020-05-15 MED ORDER — LABETALOL HCL 5 MG/ML IV SOLN: 10.0000 mg | INTRAVENOUS | Status: DC | PRN

## 2020-05-15 MED ORDER — BISACODYL 10 MG RE SUPP
10.0000 mg | Freq: Every day | RECTAL | Status: DC | PRN
  Administered 2020-05-15: 10 mg via RECTAL
  Filled 2020-05-15: qty 1

## 2020-05-15 MED ORDER — HEPARIN SODIUM (PORCINE) 1000 UNIT/ML IJ SOLN
INTRAMUSCULAR | Status: DC | PRN
  Administered 2020-05-15: 4000 [IU] via INTRAVENOUS

## 2020-05-15 MED ORDER — CEFAZOLIN SODIUM-DEXTROSE 1-4 GM/50ML-% IV SOLN
1.0000 g | Freq: Three times a day (TID) | INTRAVENOUS | Status: AC
  Administered 2020-05-15 – 2020-05-16 (×2): 1 g via INTRAVENOUS
  Filled 2020-05-15 (×2): qty 50

## 2020-05-15 MED ORDER — SODIUM CHLORIDE 0.9 % IV SOLN
INTRAVENOUS | Status: DC | PRN
  Administered 2020-05-15: 500 mL

## 2020-05-15 MED ORDER — PHENYLEPHRINE 40 MCG/ML (10ML) SYRINGE FOR IV PUSH (FOR BLOOD PRESSURE SUPPORT)
PREFILLED_SYRINGE | INTRAVENOUS | Status: AC
  Filled 2020-05-15: qty 10

## 2020-05-15 MED ORDER — PROPOFOL 10 MG/ML IV BOLUS
INTRAVENOUS | Status: DC | PRN
  Administered 2020-05-15: 70 mg via INTRAVENOUS

## 2020-05-15 MED ORDER — POTASSIUM CHLORIDE CRYS ER 20 MEQ PO TBCR: 20.0000 meq | EXTENDED_RELEASE_TABLET | Freq: Every day | ORAL | Status: DC | PRN

## 2020-05-15 MED ORDER — MAGNESIUM SULFATE 2 GM/50ML IV SOLN: 2.0000 g | Freq: Every day | INTRAVENOUS | Status: DC | PRN

## 2020-05-15 MED ORDER — PROPRANOLOL HCL ER 60 MG PO CP24
120.0000 mg | ORAL_CAPSULE | Freq: Every day | ORAL | Status: DC
  Administered 2020-05-16 – 2020-05-17 (×2): 120 mg via ORAL
  Filled 2020-05-15 (×2): qty 2
  Filled 2020-05-15: qty 1

## 2020-05-15 MED ORDER — MIDAZOLAM HCL 2 MG/2ML IJ SOLN
INTRAMUSCULAR | Status: DC | PRN
  Administered 2020-05-15 (×2): 0.5 mg via INTRAVENOUS

## 2020-05-15 MED ORDER — LIDOCAINE HCL (CARDIAC) PF 100 MG/5ML IV SOSY
PREFILLED_SYRINGE | INTRAVENOUS | Status: DC | PRN
  Administered 2020-05-15: 40 mg via INTRAVENOUS

## 2020-05-15 MED ORDER — SODIUM CHLORIDE 0.9% FLUSH: 3.0000 mL | INTRAVENOUS | Status: DC | PRN

## 2020-05-15 MED ORDER — LIDOCAINE 2% (20 MG/ML) 5 ML SYRINGE
INTRAMUSCULAR | Status: AC
  Filled 2020-05-15: qty 5

## 2020-05-15 MED ORDER — FENTANYL CITRATE (PF) 100 MCG/2ML IJ SOLN
INTRAMUSCULAR | Status: DC | PRN
  Administered 2020-05-15 (×2): 12.5 ug via INTRAVENOUS

## 2020-05-15 MED ORDER — PHENOL 1.4 % MT LIQD: 1.0000 | OROMUCOSAL | Status: DC | PRN

## 2020-05-15 MED ORDER — LACTATED RINGERS IV SOLN: INTRAVENOUS | Status: DC | PRN

## 2020-05-15 MED ORDER — FENTANYL CITRATE (PF) 250 MCG/5ML IJ SOLN
INTRAMUSCULAR | Status: AC
  Filled 2020-05-15: qty 5

## 2020-05-15 MED ORDER — SODIUM CHLORIDE 0.9 % IV SOLN: 500.0000 mL | Freq: Once | INTRAVENOUS | Status: DC | PRN

## 2020-05-15 MED ORDER — HEPARIN SODIUM (PORCINE) 1000 UNIT/ML IJ SOLN
INTRAMUSCULAR | Status: AC
  Filled 2020-05-15: qty 1

## 2020-05-15 MED ORDER — OXYCODONE-ACETAMINOPHEN 5-325 MG PO TABS
1.0000 | ORAL_TABLET | ORAL | Status: DC | PRN
  Administered 2020-05-15 – 2020-05-17 (×6): 1 via ORAL
  Filled 2020-05-15 (×6): qty 1

## 2020-05-15 MED ORDER — MIRTAZAPINE 15 MG PO TABS
15.0000 mg | ORAL_TABLET | Freq: Every day | ORAL | Status: DC
  Administered 2020-05-15 – 2020-05-16 (×2): 15 mg via ORAL
  Filled 2020-05-15 (×2): qty 1

## 2020-05-15 MED ORDER — AMLODIPINE BESYLATE 5 MG PO TABS
5.0000 mg | ORAL_TABLET | Freq: Every day | ORAL | Status: DC
  Administered 2020-05-16 – 2020-05-17 (×2): 5 mg via ORAL
  Filled 2020-05-15 (×2): qty 1

## 2020-05-15 MED ORDER — CLOPIDOGREL BISULFATE 75 MG PO TABS
75.0000 mg | ORAL_TABLET | Freq: Every day | ORAL | Status: DC
  Administered 2020-05-16 – 2020-05-17 (×2): 75 mg via ORAL
  Filled 2020-05-15 (×2): qty 1

## 2020-05-15 MED ORDER — PHENYLEPHRINE HCL-NACL 10-0.9 MG/250ML-% IV SOLN
INTRAVENOUS | Status: DC | PRN
  Administered 2020-05-15: 50 ug/min via INTRAVENOUS

## 2020-05-15 MED ORDER — CEFAZOLIN SODIUM-DEXTROSE 1-4 GM/50ML-% IV SOLN
INTRAVENOUS | Status: AC
  Filled 2020-05-15: qty 50

## 2020-05-15 MED ORDER — SODIUM CHLORIDE 0.9 % IV SOLN: 250.0000 mL | INTRAVENOUS | Status: DC | PRN

## 2020-05-15 MED ORDER — ONDANSETRON HCL 4 MG/2ML IJ SOLN: 4.0000 mg | Freq: Once | INTRAMUSCULAR | Status: DC | PRN

## 2020-05-15 MED ORDER — PANTOPRAZOLE SODIUM 40 MG PO TBEC
40.0000 mg | DELAYED_RELEASE_TABLET | Freq: Every day | ORAL | Status: DC
  Administered 2020-05-15 – 2020-05-16 (×2): 40 mg via ORAL
  Filled 2020-05-15 (×3): qty 1

## 2020-05-15 MED ORDER — IODIXANOL 320 MG/ML IV SOLN
INTRAVENOUS | Status: DC | PRN
  Administered 2020-05-15: 85 mL via INTRA_ARTERIAL

## 2020-05-15 MED ORDER — ONDANSETRON HCL 4 MG/2ML IJ SOLN
INTRAMUSCULAR | Status: DC | PRN
  Administered 2020-05-15: 4 mg via INTRAVENOUS

## 2020-05-15 MED ORDER — HEPARIN (PORCINE) IN NACL 1000-0.9 UT/500ML-% IV SOLN
INTRAVENOUS | Status: DC | PRN
  Administered 2020-05-15 (×2): 500 mL

## 2020-05-15 MED ORDER — FENTANYL CITRATE (PF) 250 MCG/5ML IJ SOLN
INTRAMUSCULAR | Status: DC | PRN
  Administered 2020-05-15: 50 ug via INTRAVENOUS

## 2020-05-15 MED ORDER — MIDAZOLAM HCL 2 MG/2ML IJ SOLN
INTRAMUSCULAR | Status: AC
  Filled 2020-05-15: qty 2

## 2020-05-15 MED ORDER — ACETAMINOPHEN 325 MG PO TABS: 650.0000 mg | ORAL_TABLET | ORAL | Status: DC | PRN

## 2020-05-15 MED ORDER — ACETAMINOPHEN 650 MG RE SUPP: 325.0000 mg | RECTAL | Status: DC | PRN

## 2020-05-15 MED ORDER — FENTANYL CITRATE (PF) 100 MCG/2ML IJ SOLN
INTRAMUSCULAR | Status: AC
  Filled 2020-05-15: qty 2

## 2020-05-15 MED ORDER — DOCUSATE SODIUM 100 MG PO CAPS
100.0000 mg | ORAL_CAPSULE | Freq: Every day | ORAL | Status: DC
  Administered 2020-05-16 – 2020-05-17 (×2): 100 mg via ORAL
  Filled 2020-05-15 (×2): qty 1

## 2020-05-15 MED ORDER — ACETAMINOPHEN 325 MG PO TABS
325.0000 mg | ORAL_TABLET | ORAL | Status: DC | PRN
  Administered 2020-05-15 – 2020-05-17 (×2): 650 mg via ORAL
  Filled 2020-05-15 (×2): qty 2

## 2020-05-15 MED ORDER — LIDOCAINE HCL (PF) 1 % IJ SOLN
INTRAMUSCULAR | Status: AC
  Filled 2020-05-15: qty 30

## 2020-05-15 MED ORDER — SODIUM CHLORIDE 0.9 % WEIGHT BASED INFUSION: 1.0000 mL/kg/h | INTRAVENOUS | Status: AC

## 2020-05-15 MED ORDER — CEFAZOLIN SODIUM-DEXTROSE 2-3 GM-%(50ML) IV SOLR
INTRAVENOUS | Status: DC | PRN
  Administered 2020-05-15: 1 g via INTRAVENOUS

## 2020-05-15 MED ORDER — ROCURONIUM BROMIDE 10 MG/ML (PF) SYRINGE
PREFILLED_SYRINGE | INTRAVENOUS | Status: DC | PRN
  Administered 2020-05-15: 50 mg via INTRAVENOUS
  Administered 2020-05-15: 20 mg via INTRAVENOUS

## 2020-05-15 SURGICAL SUPPLY — 24 items
BALLN VIATRAC 4X15X135 (BALLOONS) ×3
BALLN VIATRAC 6X15X135 (BALLOONS) ×3
BALLOON VIATRAC 4X15X135 (BALLOONS) IMPLANT
BALLOON VIATRAC 6X15X135 (BALLOONS) IMPLANT
CATH OMNI FLUSH 5F 65CM (CATHETERS) ×1 IMPLANT
CATH QUICKCROSS SUPP .018X90CM (MICROCATHETER) ×1 IMPLANT
CATH SOS OMNI O 5F 80CM (CATHETERS) ×1 IMPLANT
CLOSURE MYNX CONTROL 6F/7F (Vascular Products) ×1 IMPLANT
GUIDE CATH VISTA IMA 6F (CATHETERS) ×1 IMPLANT
GUIDE CATH VISTA JR4 6F (CATHETERS) ×1 IMPLANT
KIT PV (KITS) ×3 IMPLANT
SHEATH GUIDING 6.5 FR 55X73X9 (SHEATH) ×1 IMPLANT
SHEATH PINNACLE 6F 10CM (SHEATH) ×1 IMPLANT
SHEATH PINNACLE 7F 10CM (SHEATH) ×1 IMPLANT
SHEATH PROBE COVER 6X72 (BAG) ×1 IMPLANT
STENT HERCULINK RX 5.0X12X135 (Permanent Stent) ×1 IMPLANT
SYR MEDRAD MARK V 150ML (SYRINGE) ×1 IMPLANT
TRANSDUCER W/STOPCOCK (MISCELLANEOUS) ×3 IMPLANT
TRAY PV CATH (CUSTOM PROCEDURE TRAY) ×3 IMPLANT
TUBING CIL FLEX 10 FLL-RA (TUBING) ×1 IMPLANT
WIRE EMERALD ST .035X150CM (WIRE) ×1 IMPLANT
WIRE J 3MM .035X145CM (WIRE) ×1 IMPLANT
WIRE ROSEN-J .035X180CM (WIRE) ×1 IMPLANT
WIRE STABILIZER XS .014X180CM (WIRE) ×2 IMPLANT

## 2020-05-15 SURGICAL SUPPLY — 44 items
ADH SKN CLS APL DERMABOND .7 (GAUZE/BANDAGES/DRESSINGS) ×1
AGENT HMST SPONGE THK3/8 (HEMOSTASIS)
CANISTER SUCT 3000ML PPV (MISCELLANEOUS) ×3 IMPLANT
CLIP VESOCCLUDE MED 24/CT (CLIP) ×3 IMPLANT
CLIP VESOCCLUDE SM WIDE 24/CT (CLIP) ×3 IMPLANT
COVER WAND RF STERILE (DRAPES) ×1 IMPLANT
DERMABOND ADVANCED (GAUZE/BANDAGES/DRESSINGS) ×2
DERMABOND ADVANCED .7 DNX12 (GAUZE/BANDAGES/DRESSINGS) ×1 IMPLANT
DRAIN CHANNEL 15F RND FF W/TCR (WOUND CARE) IMPLANT
ELECT REM PT RETURN 9FT ADLT (ELECTROSURGICAL) ×3
ELECTRODE REM PT RTRN 9FT ADLT (ELECTROSURGICAL) ×1 IMPLANT
EVACUATOR SILICONE 100CC (DRAIN) IMPLANT
GLOVE BIO SURGEON STRL SZ7.5 (GLOVE) ×3 IMPLANT
GLOVE BIOGEL PI IND STRL 8 (GLOVE) ×1 IMPLANT
GLOVE BIOGEL PI IND STRL 8.5 (GLOVE) IMPLANT
GLOVE BIOGEL PI INDICATOR 8 (GLOVE) ×2
GLOVE BIOGEL PI INDICATOR 8.5 (GLOVE) ×4
GLOVE SS BIOGEL STRL SZ 6.5 (GLOVE) IMPLANT
GLOVE SUPERSENSE BIOGEL SZ 6.5 (GLOVE) ×2
GOWN STRL REUS W/ TWL LRG LVL3 (GOWN DISPOSABLE) ×3 IMPLANT
GOWN STRL REUS W/ TWL XL LVL3 (GOWN DISPOSABLE) ×2 IMPLANT
GOWN STRL REUS W/TWL LRG LVL3 (GOWN DISPOSABLE) ×9
GOWN STRL REUS W/TWL XL LVL3 (GOWN DISPOSABLE) ×6
HEMOSTAT SPONGE AVITENE ULTRA (HEMOSTASIS) IMPLANT
KIT BASIN OR (CUSTOM PROCEDURE TRAY) ×3 IMPLANT
KIT TURNOVER KIT B (KITS) ×3 IMPLANT
LOOP VESSEL MINI RED (MISCELLANEOUS) ×2 IMPLANT
MARKER SKIN DUAL TIP RULER LAB (MISCELLANEOUS) ×2 IMPLANT
NS IRRIG 1000ML POUR BTL (IV SOLUTION) ×8 IMPLANT
PACK PERIPHERAL VASCULAR (CUSTOM PROCEDURE TRAY) ×3 IMPLANT
PAD ARMBOARD 7.5X6 YLW CONV (MISCELLANEOUS) ×6 IMPLANT
PATCH VASC XENOSURE 1CMX6CM (Vascular Products) ×3 IMPLANT
PATCH VASC XENOSURE 1X6 (Vascular Products) IMPLANT
SUT MNCRL AB 4-0 PS2 18 (SUTURE) ×3 IMPLANT
SUT PROLENE 5 0 C 1 24 (SUTURE) ×9 IMPLANT
SUT PROLENE 6 0 BV (SUTURE) ×2 IMPLANT
SUT VIC AB 2-0 CT1 27 (SUTURE) ×3
SUT VIC AB 2-0 CT1 TAPERPNT 27 (SUTURE) ×1 IMPLANT
SUT VIC AB 3-0 SH 27 (SUTURE) ×3
SUT VIC AB 3-0 SH 27X BRD (SUTURE) ×1 IMPLANT
TOWEL GREEN STERILE (TOWEL DISPOSABLE) ×3 IMPLANT
TRAY FOLEY MTR SLVR 16FR STAT (SET/KITS/TRAYS/PACK) IMPLANT
UNDERPAD 30X36 HEAVY ABSORB (UNDERPADS AND DIAPERS) ×3 IMPLANT
WATER STERILE IRR 1000ML POUR (IV SOLUTION) ×3 IMPLANT

## 2020-05-15 NOTE — Anesthesia Procedure Notes (Signed)
Procedure Name: Intubation Date/Time: 05/15/2020 12:49 PM Performed by: Michele Rockers, CRNA Pre-anesthesia Checklist: Patient identified, Emergency Drugs available, Suction available and Patient being monitored Patient Re-evaluated:Patient Re-evaluated prior to induction Oxygen Delivery Method: Circle system utilized Preoxygenation: Pre-oxygenation with 100% oxygen Induction Type: IV induction Ventilation: Mask ventilation without difficulty Laryngoscope Size: Miller and 2 Grade View: Grade I Tube type: Oral Tube size: 7.0 mm Number of attempts: 1 Airway Equipment and Method: Stylet and Oral airway Placement Confirmation: ETT inserted through vocal cords under direct vision,  positive ETCO2 and breath sounds checked- equal and bilateral Secured at: 21 cm Tube secured with: Tape Dental Injury: Teeth and Oropharynx as per pre-operative assessment

## 2020-05-15 NOTE — Progress Notes (Signed)
Pt received from PACU to 4e19. Oriented to room and call bell. CHG bath complete. Pt connected to tele box and CCMD called. VSS. Call bell in reach. Will continue to monitor.  Arletta Bale, RN

## 2020-05-15 NOTE — Anesthesia Postprocedure Evaluation (Signed)
Anesthesia Post Note  Patient: DIANY FORMOSA  Procedure(s) Performed: Right Common Femoral Endarterectomy (Right Groin) Common Femoral Artery Patch Angioplasty using XenoSure Bovine Pericardial Patch (Right Groin)     Patient location during evaluation: PACU Anesthesia Type: General Level of consciousness: awake and alert Pain management: pain level controlled Vital Signs Assessment: post-procedure vital signs reviewed and stable Respiratory status: spontaneous breathing, nonlabored ventilation, respiratory function stable and patient connected to nasal cannula oxygen Cardiovascular status: blood pressure returned to baseline and stable Postop Assessment: no apparent nausea or vomiting Anesthetic complications: no   No complications documented.  Last Vitals:  Vitals:   05/15/20 1535 05/15/20 1559  BP: (!) 114/45 (!) 112/55  Pulse: 65 72  Resp: 18 18  Temp:  (!) 36.4 C  SpO2: 92% 92%    Last Pain:  Vitals:   05/15/20 1559  TempSrc: Axillary  PainSc:                  Catalina Gravel

## 2020-05-15 NOTE — Progress Notes (Signed)
Patient underwent ultrasound-guided access of the right femoral artery for celiac artery intervention.  Ultimately a mynx closure device was used.  She is complaining of numbness in her right foot and her signals are very weak in the right foot and the foot looks pale.  Suspect there is a complication from the mynx closure device in her groin.  Have ordered ordered a stat right lower extremity arterial duplex.  We will then plan to proceed with right femoral exploration and likely endarterectomy and patch.  Have updated the family.  Marty Heck, MD [facility] [place] Office: [place]

## 2020-05-15 NOTE — Progress Notes (Signed)
Pt c/o numbness to rt foot. Unable to doppler pulses rt foot. Toes white. MD notified. Will come to assess.

## 2020-05-15 NOTE — H&P (Signed)
History and Physical Interval Note:  05/15/2020 7:48 AM  Jessica [name]  has presented today for surgery, with the diagnosis of ischemia.  The various methods of treatment have been discussed with the patient and family. After consideration of risks, benefits and other options for treatment, the patient has consented to  Procedure(s): MESENTERIC  ANGIOGRAPHY (N/A) as a surgical intervention.  The patient's history has been reviewed, patient examined, no change in status, stable for surgery.  I have reviewed the patient's chart and labs.  Questions were answered to the patient's satisfaction.    Mesenteric arteriogram.  Concern for high grade stenosis in celiac and SMA on duplex.  Jessica [name]  Patient name: Jessica [name]   MRN: [id number]        DOB: 25-Feb-1932            Sex: female  REASON FOR CONSULT: Evaluate for mesenteric ischemia  HPI: Jessica [name] is a 86 y.o. female, with history of hyperlipidemia who presents for evaluation of epigastric pain and possible mesenteric ischemia.  Patient reports about 3 months of epigastric abdominal pain.  Ultimately she had EGD on 03/22/2020 that showed diffuse inflammation of the gastric body and antrum and her pathology was consistent with chronic gastritis with reactive changes.  She subsequently had a CT as well on 03/07/2020 and on my review there is some distal aortoiliac disease and looks like she has some atherosclerotic disease at both the SMA and celiac origin.  She was sent for mesenteric duplex that showed concern for high-grade SMA stenosis.  She reports she has been compliant with her omeprazole over the last 3 months and is seeing no improvement.  States she does not look forward to eating and does not have much of an appetite.  Has lost about 10 pounds and is only about 80 pounds at this time.  She reports previous renal artery angioplasty years ago.      Past Medical History:  Diagnosis Date  . Anemia   . Arthritis    "thumbs"  (06/27/2015)  . Bilateral renal artery stenosis ([place])   . Cataract   . Chronic bronchitis ([place])   . Chronic lower back pain   . GERD (gastroesophageal reflux disease)   . Headache    "probably weekly" (06/27/2015)  . Hypertension   . Hypertriglyceridemia   . Migraine    "maybe monthly" (06/27/2015)         Past Surgical History:  Procedure Laterality Date  . BALLOON DILATION  1980's?   "for renal stenosis"  . CATARACT EXTRACTION, BILATERAL    . RECONSTRUCTION OF EYELID Bilateral 01/2016   Upper and lower eyelid  . RENAL ANGIOPLASTY    . TOE SURGERY Bilateral    "straightened big toe"  . TONSILLECTOMY  ~ 1941  . VAGINAL HYSTERECTOMY  1973         Family History  Problem Relation Age of Onset  . Hypertension Brother   . Hyperlipidemia Brother   . Parkinsonism Father   . Stroke Maternal Grandfather   . Colon cancer Neg Hx   . Colon polyps Neg Hx   . Stomach cancer Neg Hx     SOCIAL HISTORY: Social History        Socioeconomic History  . Marital status: Widowed    Spouse name: Not on file  . Number of children: 1  . Years of education: Not on file  . Highest education level: Not on file  Occupational History  .  Occupation: retired  Tobacco Use  . Smoking status: Former Smoker    Packs/day: 0.50    Years: 20.00    Pack years: 10.00    Types: Cigarettes  . Smokeless tobacco: Never Used  . Tobacco comment: 'quit smoking in the 1990's?"  Substance and Sexual Activity  . Alcohol use: Yes    Alcohol/week: 0.0 - 1.0 standard drinks    Comment: rarely  . Drug use: No  . Sexual activity: Yes    Partners: Male    Birth control/protection: Surgical, Post-menopausal    Comment: hysterectomy  Other Topics Concern  . Not on file  Social History Narrative  . Not on file   Social Determinants of Health      Financial Resource Strain:   . Difficulty of Paying Living Expenses:   Food Insecurity:   .  Worried About Charity fundraiser in the Last Year:   . Arboriculturist in the Last Year:   Transportation Needs:   . Film/video editor (Medical):   Marland Kitchen Lack of Transportation (Non-Medical):   Physical Activity:   . Days of Exercise per Week:   . Minutes of Exercise per Session:   Stress:   . Feeling of Stress :   Social Connections:   . Frequency of Communication with Friends and Family:   . Frequency of Social Gatherings with Friends and Family:   . Attends Religious Services:   . Active Member of Clubs or Organizations:   . Attends Archivist Meetings:   Marland Kitchen Marital Status:   Intimate Partner Violence:   . Fear of Current or Ex-Partner:   . Emotionally Abused:   Marland Kitchen Physically Abused:   . Sexually Abused:     No Known Allergies        Current Outpatient Medications  Medication Sig Dispense Refill  . amLODipine (NORVASC) 10 MG tablet Take 1 tablet (10 mg total) by mouth daily. 30 tablet 0  . clopidogrel (PLAVIX) 75 MG tablet Take 1 tablet (75 mg total) by mouth daily. 30 tablet 0  . fenofibrate 160 MG tablet Take 160 mg by mouth daily.    Marland Kitchen HYDROcodone-acetaminophen (NORCO) 10-325 MG tablet Take 1 tablet by mouth every 6 (six) hours as needed for moderate pain.   0  . losartan (COZAAR) 100 MG tablet Take 100 mg by mouth daily.     . mirtazapine (REMERON) 15 MG tablet Take 15 mg by mouth at bedtime.    . montelukast (SINGULAIR) 10 MG tablet Take 10 mg by mouth daily.  6  . Multiple Vitamins-Minerals (MULTIVITAMIN PO) Take 1 tablet by mouth daily.     Marland Kitchen omeprazole (PRILOSEC) 40 MG capsule Take 1 capsule (40 mg total) by mouth in the morning and at bedtime. 90 capsule 3  . ondansetron (ZOFRAN) 4 MG tablet Take 4 mg by mouth every 8 (eight) hours as needed for nausea.    . propranolol ER (INDERAL LA) 120 MG 24 hr capsule Take 120 mg by mouth daily.  3  . omeprazole (PRILOSEC) 20 MG capsule Take 20 mg by mouth every evening.   5  . Vitamin D,  Ergocalciferol, (DRISDOL) 1.25 MG (50000 UNIT) CAPS capsule Take 50,000 Units by mouth every 7 (seven) days.     No current facility-administered medications for this visit.    REVIEW OF SYSTEMS:  [X]  denotes positive finding, [ ]  denotes negative finding Cardiac  Comments:  Chest pain or chest pressure:    Shortness  of breath upon exertion:    Short of breath when lying flat:    Irregular heart rhythm:        Vascular    Pain in calf, thigh, or hip brought on by ambulation:    Pain in feet at night that wakes you up from your sleep:     Blood clot in your veins:    Leg swelling:         Pulmonary    Oxygen at home:    Productive cough:     Wheezing:         Neurologic    Sudden weakness in arms or legs:     Sudden numbness in arms or legs:     Sudden onset of difficulty speaking or slurred speech:    Temporary loss of vision in one eye:     Problems with dizziness:         Gastrointestinal    Blood in stool:     Vomited blood:         Genitourinary    Burning when urinating:     Blood in urine:        Psychiatric    Major depression:         Hematologic    Bleeding problems:    Problems with blood clotting too easily:        Skin    Rashes or ulcers:        Constitutional    Fever or chills:      PHYSICAL EXAM:    Vitals:   05/07/20 0955  BP: 118/72  Pulse: 70  Resp: 14  Temp: (!) 96.6 F (35.9 C)  TempSrc: Temporal  SpO2: 98%  Weight: 80 lb (36.3 kg)  Height: 5\' 1"  (1.549 m)    GENERAL: The patient is a well-nourished female, in no acute distress. The vital signs are documented above. CARDIAC: There is a regular rate and rhythm.  VASCULAR:  Palpable femoral pulses bilaterally PULMONARY: There is good air exchange bilaterally without wheezing or rales. ABDOMEN: Soft and non-tender with normal pitched bowel sounds. Very thin  abdomen. MUSCULOSKELETAL: There are no major deformities or cyanosis. NEUROLOGIC: No focal weakness or paresthesias are detected. SKIN: There are no ulcers or rashes noted. PSYCHIATRIC: The patient has a normal affect.  DATA:   Mesenteric duplex with concern for 70 to 90% stenosis in this SMA and celiac artery.  Assessment/Plan:  86 year old female presents for evaluation of chronic mesenteric ischemia with 3 months of epigastric abdominal pain.  I discussed with her that based on the mesenteric duplex there was findings concerning for high-grade stenosis of both the SMA and the celiac artery.  In review of her CT scan certainly appears to have more significant celiac artery disease than in the SMA, but I think it would be appropriate to do a mesenteric arteriogram to confirm.  Discussed that if her mesenteric arteriogram does not show any high-grade stenosis this would just be a diagnostic study but could also intervene with possible mesenteric stent placement at the same time.  Certainly her gastritis could be the etiology for her ongoing epigastric pain and weight loss given the findings on EGD which I discussed with her family.  The concern is that she has shown no improvement after 3 months of medical therapy.  We discussed risk and benefits in detail.   Jessica Heck, MD [facility] [place] Office: [phone]

## 2020-05-15 NOTE — Progress Notes (Signed)
Stat right lower extremity arterial duplex completed. Refer to "CV Proc" under chart review to view preliminary results.  Preliminary results discussed with Dr. Carlis Abbott.  05/15/2020 11:57 AM Kelby Aline., MHA, RVT, RDCS, RDMS

## 2020-05-15 NOTE — Anesthesia Preprocedure Evaluation (Signed)
Anesthesia Evaluation  Patient identified by MRN, date of birth, ID band Patient awake    Reviewed: Allergy & Precautions, NPO status , Patient's Chart, lab work & pertinent test results  Airway Mallampati: II  TM Distance: >3 FB Neck ROM: Full    Dental  (+) Teeth Intact, Dental Advisory Given   Pulmonary neg pulmonary ROS, former smoker,    Pulmonary exam normal breath sounds clear to auscultation       Cardiovascular hypertension, Pt. on medications and Pt. on home beta blockers + Peripheral Vascular Disease (Leg ischemia)  Normal cardiovascular exam Rhythm:Regular Rate:Normal     Neuro/Psych  Headaches, TIA Neuromuscular disease    GI/Hepatic Neg liver ROS, GERD  Medicated,  Endo/Other  negative endocrine ROS  Renal/GU Renal hypertensionRenal disease     Musculoskeletal  (+) Arthritis ,   Abdominal   Peds  Hematology  (+) Blood dyscrasia (Plavix), ,   Anesthesia Other Findings Day of surgery medications reviewed with the patient.  Reproductive/Obstetrics                           Anesthesia Physical Anesthesia Plan  ASA: III and emergent  Anesthesia Plan: General   Post-op Pain Management:    Induction: Intravenous  PONV Risk Score and Plan: 3 and Dexamethasone, Ondansetron and Treatment may vary due to age or medical condition  Airway Management Planned: Oral ETT  Additional Equipment:   Intra-op Plan:   Post-operative Plan: Extubation in OR  Informed Consent: I have reviewed the patients History and Physical, chart, labs and discussed the procedure including the risks, benefits and alternatives for the proposed anesthesia with the patient or authorized representative who has indicated his/her understanding and acceptance.     Dental advisory given  Plan Discussed with: CRNA  Anesthesia Plan Comments: (Risks/benefits of general anesthesia discussed with patient  including risk of damage to teeth, lips, gum, and tongue, nausea/vomiting, allergic reactions to medications, and the possibility of heart attack, stroke and death.  All patient questions answered.  Patient wishes to proceed.)        Anesthesia Quick Evaluation

## 2020-05-15 NOTE — Progress Notes (Signed)
PHARMACY NOTE:  ANTIMICROBIAL RENAL DOSAGE ADJUSTMENT  Current antimicrobial regimen includes a mismatch between antimicrobial dosage and estimated renal function.  As per policy approved by the Pharmacy & Therapeutics and Medical Executive Committees, the antimicrobial dosage will be adjusted accordingly.  Current antimicrobial dosage:  Ancef 2gm IV Q8H x 2 doses  Indication: surgical phrophylaxis  Renal Function:  Estimated Creatinine Clearance: 30.2 mL/min (by C-G formula based on SCr of 0.8 mg/dL). []      On intermittent HD, scheduled: []      On CRRT    Antimicrobial dosage has been changed to:  Ancef 1gm IV Q8H x 2 doses  Additional comments:  Elderly with borderline CrCL and low weight.  Juana Montini D. Mina Marble, PharmD, BCPS, [place] 05/15/2020, 4:14 PM

## 2020-05-15 NOTE — Discharge Instructions (Signed)
° °  [facility] [place] ° °Discharge Instructions ° °Lower Extremity Angiogram; Angioplasty/Stenting ° °Please refer to the following instructions for your post-procedure care. Your surgeon or physician assistant will discuss any changes with you. ° °Activity ° °Avoid lifting more than 8 pounds (1 gallons of milk) for 72 hours (3 days) after your procedure. You may walk as much as you can tolerate. It's OK to drive after 72 hours. ° °Bathing/Showering ° °You may shower the day after your procedure. If you have a bandage, you may remove it at 24- 48 hours. Clean your incision site with mild soap and water. Pat the area dry with a clean towel. ° °Diet ° °Resume your pre-procedure diet. There are no special food restrictions following this procedure. All patients with peripheral vascular disease should follow a low fat/low cholesterol diet. In order to heal from your surgery, it is CRITICAL to get adequate nutrition. Your body requires vitamins, minerals, and protein. Vegetables are the best source of vitamins and minerals. Vegetables also provide the perfect balance of protein. Processed food has little nutritional value, so try to avoid this. ° °Medications ° °Resume taking all of your medications unless your doctor tells you not to. If your incision is causing pain, you may take over-the-counter pain relievers such as acetaminophen (Tylenol) ° °Follow Up ° °Follow up will be arranged at the time of your procedure. You may have an office visit scheduled or may be scheduled for surgery. Ask your surgeon if you have any questions. ° °Please call us immediately for any of the following conditions: °•Severe or worsening pain your legs or feet at rest or with walking. °•Increased pain, redness, drainage at your groin puncture site. °•Fever of 101 degrees or higher. °•If you have any mild or slow bleeding from your puncture site: lie down, apply firm constant pressure over the area with a piece of  gauze or a clean wash cloth for 30 minutes- no peeking!, call 911 right away if you are still bleeding after 30 minutes, or if the bleeding is heavy and unmanageable. ° °Reduce your risk factors of vascular disease: ° °Stop smoking. If you would like help call [facility] at 1-800-QUIT-NOW (1-800-784-8669) or [place] at 336-586-4000. °Manage your cholesterol °Maintain a desired weight °Control your diabetes °Keep your blood pressure down ° °If you have any questions, please call the office at [phone] ° °

## 2020-05-15 NOTE — Transfer of Care (Signed)
Immediate Anesthesia Transfer of Care Note  Patient: Jessica [name]  Procedure(s) Performed: Right Common Femoral Endarterectomy (Right Groin) Common Femoral Artery Patch Angioplasty using XenoSure Bovine Pericardial Patch (Right Groin)  Patient Location: PACU  Anesthesia Type:General  Level of Consciousness: drowsy, patient cooperative and responds to stimulation  Airway & Oxygen Therapy: Patient Spontanous Breathing and Patient connected to face mask oxygen  Post-op Assessment: Report given to RN and Post -op Vital signs reviewed and stable  Post vital signs: Reviewed and stable  Last Vitals:  Vitals Value Taken Time  BP 154/63 05/15/20 1434  Temp    Pulse 64 05/15/20 1442  Resp 19 05/15/20 1442  SpO2 100 % 05/15/20 1442  Vitals shown include unvalidated device data.  Last Pain:  Vitals:   05/15/20 1059  TempSrc:   PainSc: 0-No pain      Patients Stated Pain Goal: 3 (16/10/96 0454)  Complications: No complications documented.

## 2020-05-15 NOTE — Progress Notes (Signed)
  Day of Surgery Note    Subjective:  Foot feels better...no longer numb   Vitals:   05/15/20 1535 05/15/20 1559  BP: (!) 114/45 (!) 112/55  Pulse: 65 72  Resp: 18 18  Temp:  (!) 97.5 F (36.4 C)  SpO2: 92% 92%    Incisions:  Right groin well approximated. No hematoma Extremities:  Right foot with improved color and sensation. Doppler pulse in DP and PT Cardiac:  RRR Lungs:  nonlabored    Assessment/Plan:  This is a 86 y.o. female who is s/p right femoral endarterectomy after Mynx closure of right groin access and loss of femoral pulse, ischemic RLE. Positive right femoral pulse and good perfusion of right foot with + Doppler signals   -Risa Grill, PA-C 05/15/2020 5:23 PM [phone]

## 2020-05-15 NOTE — Op Note (Signed)
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 Ultrasound-guided access of the right common femoral artery 2. Mesenteric arteriogram including catheter selection of the aorta 3. Celiac artery angioplasty with stent placement (predilated with a 4 mm x 15 mm Viatrac, primarily stented with a 5 mm x 12 mm bare-metal Herculink, post-dilated with a 6 mm Viatrac) 4. Mynx closure of the right common femoral artery 5. 92 minutes of monitored moderate conscious sedation  Indications: Patient is a 86 year old female who was seen in consultation for possible mesenteric ischemia. She has had chronic abdominal pain with significant weight loss and was found to have chronic gastritis on EGD. Duplex showed high-grade celiac and SMA stenosis. She presents today for planned mesenteric arteriogram and possible intervention after risk benefits discussed.  Findings:   Mesenteric arteriogram showed high-grade greater than 90% stenosis in the ostium of the celiac artery. The SMA was widely patent with no flow-limiting stenosis. The IMA was also patent.  Ultimately the celiac artery stenosis was crossed from right femoral approach and this was predilated with a 4 mm Viatrac and then primarily stented with a 5 mm Herculink. The stent was flared with a 6 mm balloon. Widely patent celiac artery stent with all branches filling and filling of the SMA as well after intervention.   Procedure:  The patient was identified in the holding area and taken to room 8.  The patient was then placed supine on the table and prepped and draped in the usual sterile fashion.  A time out was called.  Ultrasound was used to evaluate the  right common femoral artery.  It was patent .  A digital ultrasound image was acquired.  A micropuncture needle was used to access the right common femoral artery under ultrasound guidance.  An 018 wire was advanced without resistance and a micropuncture sheath was placed.  The 018 wire was removed and a J wire was placed.  The micropuncture sheath was exchanged for a 5 french sheath.  An omniflush catheter was advanced over the wire to the level of L-1. Ultimately the C-arm was placed in the lateral position and we get dedicated imaging of the mesenteric arteries with pertinent findings noted above. It was apparent that the celiac was the most high-grade stenosis. Ultimately patient was given 100 units/kg heparin. I then initially used an IM 6 [place] guide cath with an 014 stabilizer wire and attempted to cross the celiac artery stenosis. On multiple occasions I was able to get across the stenosis in the celiac artery and out into the branches but I could not get the guide cath to advance. I thought this may be due to the steep angulation of the IM guide cath and actually tried to switch out for a JR4 guide cath. Again I could get the 014 stabilizer wire across the lesion but could not get the guide cath to advance. Subsequently attempted to upsized to an 035 system for more support and used a SOS catheter in the six [place] guide cath but again could not get the SOS catheter very far past the lesion and when I tried to exchange for the 035 system I lost support. I then elected to go with a steerable Oscar sheath. This was then exchanged over  a J-wire in the right groin placed up into the infrarenal aorta. I then angulated the guide cath to the takeoff of celiac artery after we reimaged to mark exactly where that artery was located. I then got across the lesion with an 014 stabilizer wire and given the extra support from the steerable sheath had more support to get the wire much further out the celiac branch. Given  the degree of stenosis in the proximal celiac I predilated this with a 4 mm Viatrac. This was then primarily stented with a 5 mm Herculink. I then flared the stent with a 6 mm balloon. Final injection showed excellent filling of the celiac artery with a stent fully deployed no residual stenosis as well as excellent filling of the SMA. The stent was hung out into the aorta about 1-2 mm.  That point time wires and catheters were removed and exchanged for a short 7 French sheath in the right groin. A limited right groin arteriogram was obtained. I then used a mynx closure device in the right groin.  Plan: Patient will continue her Plavix which she is already taking. Will arrange follow-up in 1 month.  Marty Heck, MD [facility] Office: [phone]

## 2020-05-15 NOTE — Op Note (Signed)
Date: May 15, 2020  Preoperative diagnosis: Ischemic right lower extremity after right common femoral artery percutaneous access for mesenteric artery intervention  Postoperative diagnosis: Same  Procedure: Right common femoral thromboendarterectomy with bovine pericardial patch angioplasty  Surgeon: Dr. Marty Heck, MD  Assistant: Risa Grill, PA  Indications: Patient is an 86 year old female who underwent percutaneous right common femoral artery access earlier today for mesenteric arteriogram and mesenteric intervention.  Ultimately a mynx closure device was used at the completion of the case.  In recovery she was noted to have increasing numbness in her right leg with a very weak femoral signal on the right.  Ultimately right arterial duplex showed occlusion of the right common femoral artery and I took her to the operating room for right groin exploration after risk benefits were discussed.  Findings: There was a dissection in the distal right external iliac artery into the common femoral that had thrombosed from the balloon on the mynx device and the mynx plug was in the lumen of the artery.  Ultimately the distal external iliac and common femoral artery were endarterectomized.  Bovine pericardial patch was sewn.  Patient had very brisk dorsalis pedis and posterior signals in the right foot at completion and a palpable right femoral pulse.  Anesthesia: General  Details: Patient was taken to the operating room after informed consent was obtained.  Placed on operative table in the supine position general anesthesia was induced.  Patient got preoperative antibiotics.  Initially made a horizontal groin incision over the right femoral artery.  Opened subcutaneous tissue with Bovie cautery and the femoral sheath was opened longitudinally.  Dissected down got circumferential control of the SFA and profunda these were then controlled with Vesseloops and we had to dissect under the inguinal  ligament up above the circumflex branches in order to get to the distal external iliac where we could appreciate a pulse.  That point in time patient was given 100 units per kilogram heparin.  We pulled up on Vesseloops on the SFA and profunda and put a Henley clamp on the external iliac under the inguinal ligament.  I then opened the right common femoral artery longitudinally with Potts scissors through our previous arteriotomy and there was acute thrombus as well as the mynx plug within the artery.  As we continued to trace this up under the inguinal ligament became apparent that there was a dissection in the distal external iliac down to the common femoral where the plug deployed.  A penfield elevator was used to perform endarterectomy of distal external iliac and common femoral.  I released the Henley clamp on the distal external iliac and had excellent pulsatile inflow up above the dissection.  Ultimately I used the dissection flap to perform endarterectomy all the way down to just before the takeoff of the profunda where the flap was trimmed.  All the mynx plug was removed from within the artery.  We had good backbleeding from the profunda and SFA.  The flap was then tacked down with 5-0 Prolene distally.  I then brought a bovine pericardial patch on the field this was sewn using a 5-0 Prolene in parachute technique.  Everything was de-aired prior to completion.  When we came off clamps we had good SFA and profunda triphasic signals.  There was excellent dorsalis pedis and posterior tibial signals in the right foot.  The groin was irrigated and closed in multiple layers of 3-0 Vicryl and 4-0 Monocryl and Dermabond.  Complication: None  Condition:  Stable  Marty Heck, MD [facility] [place] Office: [place]

## 2020-05-16 ENCOUNTER — Encounter (HOSPITAL_COMMUNITY): Payer: Self-pay | Admitting: Vascular Surgery

## 2020-05-16 ENCOUNTER — Encounter (HOSPITAL_COMMUNITY): Payer: Self-pay | Admitting: Certified Registered Nurse Anesthetist

## 2020-05-16 LAB — CBC
HCT: 27.2 % — ABNORMAL LOW (ref 36.0–46.0)
Hemoglobin: 8.6 g/dL — ABNORMAL LOW (ref 12.0–15.0)
MCH: 29.4 pg (ref 26.0–34.0)
MCHC: 31.6 g/dL (ref 30.0–36.0)
MCV: 92.8 fL (ref 80.0–100.0)
Platelets: 343 10*3/uL (ref 150–400)
RBC: 2.93 MIL/uL — ABNORMAL LOW (ref 3.87–5.11)
RDW: 14.2 % (ref 11.5–15.5)
WBC: 6.9 10*3/uL (ref 4.0–10.5)
nRBC: 0 % (ref 0.0–0.2)

## 2020-05-16 LAB — BASIC METABOLIC PANEL
Anion gap: 11 (ref 5–15)
BUN: 28 mg/dL — ABNORMAL HIGH (ref 8–23)
CO2: 19 mmol/L — ABNORMAL LOW (ref 22–32)
Calcium: 8.4 mg/dL — ABNORMAL LOW (ref 8.9–10.3)
Chloride: 105 mmol/L (ref 98–111)
Creatinine, Ser: 1.03 mg/dL — ABNORMAL HIGH (ref 0.44–1.00)
GFR calc Af Amer: 57 mL/min — ABNORMAL LOW (ref >60)
GFR calc non Af Amer: 50 mL/min — ABNORMAL LOW (ref >60)
Glucose, Bld: 329 mg/dL — ABNORMAL HIGH (ref 70–99)
Potassium: 4.4 mmol/L (ref 3.5–5.1)
Sodium: 135 mmol/L (ref 135–145)

## 2020-05-16 LAB — LIPID PANEL
Cholesterol: 110 mg/dL (ref 0–200)
HDL: 39 mg/dL — ABNORMAL LOW (ref >40)
LDL Cholesterol: 42 mg/dL (ref 0–99)
Total CHOL/HDL Ratio: 2.8 RATIO
Triglycerides: 147 mg/dL (ref <150)
VLDL: 29 mg/dL (ref 0–40)

## 2020-05-16 MED ORDER — PHENYLEPHRINE HCL-NACL 10-0.9 MG/250ML-% IV SOLN
INTRAVENOUS | Status: AC
  Filled 2020-05-16: qty 250

## 2020-05-16 MED ORDER — ATORVASTATIN CALCIUM 10 MG PO TABS
20.0000 mg | ORAL_TABLET | Freq: Every day | ORAL | Status: DC
  Administered 2020-05-16 – 2020-05-17 (×2): 20 mg via ORAL
  Filled 2020-05-16 (×2): qty 2

## 2020-05-16 NOTE — Progress Notes (Addendum)
Pt complained having severe constipation. Reviewed record her last BM 05/14/20. Pt sated she had just minimal BM and requested some help for stool evacuation. Notified Dr. Trula Slade, order recieved for Dulcolax suppository and fecal disimpaction. Medication given after fecal disimpaction performed with no difficulties, got small amount of soft brown stool.   She remained afebrile, vital signs stale. NSR on monitor. No immediate distress. Her right groin surgical wound was dry and clean. Doppler detected strong patent PT, DP and AT pulses.  Pain tolerated well. Continue to monitor.  Kennyth Lose, RN

## 2020-05-16 NOTE — Progress Notes (Addendum)
  Progress Note    05/16/2020 7:49 AM 1 Day Post-Op  Subjective:  No post prandial pain after dinner last night.  Soreness R groin incision.  Denies rest pain R foot.   Vitals:   05/15/20 2356 05/16/20 0400  BP: (!) 137/51 (!) 121/45  Pulse:  88  Resp: (!) 23 (!) 21  Temp:  98.5 F (36.9 C)  SpO2:  97%   Physical Exam: Lungs:  Non labored Incisions:  R groin incision c/d/i and soft Extremities:  Brisk DP and PT RLE Neurologic: A&O  CBC    Component Value Date/Time   WBC 6.9 05/16/2020 0048   RBC 2.93 (L) 05/16/2020 0048   HGB 8.6 (L) 05/16/2020 0048   HCT 27.2 (L) 05/16/2020 0048   PLT 343 05/16/2020 0048   MCV 92.8 05/16/2020 0048   MCH 29.4 05/16/2020 0048   MCHC 31.6 05/16/2020 0048   RDW 14.2 05/16/2020 0048   LYMPHSABS 2.3 06/27/2015 0025   MONOABS 0.6 06/27/2015 0025   EOSABS 0.3 06/27/2015 0025   BASOSABS 0.1 06/27/2015 0025    BMET    Component Value Date/Time   NA 135 05/16/2020 0048   K 4.4 05/16/2020 0048   CL 105 05/16/2020 0048   CO2 19 (L) 05/16/2020 0048   GLUCOSE 329 (H) 05/16/2020 0048   BUN 28 (H) 05/16/2020 0048   CREATININE 1.03 (H) 05/16/2020 0048   CALCIUM 8.4 (L) 05/16/2020 0048   GFRNONAA 50 (L) 05/16/2020 0048   GFRAA 57 (L) 05/16/2020 0048    INR    Component Value Date/Time   INR 1.03 06/27/2015 0025     Intake/Output Summary (Last 24 hours) at 05/16/2020 0749 Last data filed at 05/16/2020 0500 Gross per 24 hour  Intake 3675.72 ml  Output 516 ml  Net 3159.72 ml     Assessment/Plan:  86 y.o. female is s/p celiac artery stent and subsequent R CFA repair 1 Day Post-Op   RLE well perfused R groin incision unremarkable; without hematoma No post prandial pain after dinner last night Encouraged OOB Ok for discharge home this afternoon if pain is controlled with p.o. medication, she tolerates breakfast, and if mobility is improved    Dagoberto Ligas, PA-C [facility] [phone] 05/16/2020 7:49  AM  I have seen and evaluated the patient. I agree with the PA note as documented above.  Postop day 1 status post celiac artery stent and subsequent right femoral endarterectomy for occluded right common femoral artery after transfemoral access for mesenteric intervention.  Right groin looks clean dry and intact.  She has a palpable right femoral pulse.  Right dorsalis pedis posterior tibial brisk by Doppler.  Needs to stay on Plavix.  We will see how she does with mobility today and hopefully plan for discharge.  No postprandial pain after mesenteric stenting and tolerating a diet.  Marty Heck, MD [facility] [place] Office: [phone]

## 2020-05-16 NOTE — Addendum Note (Signed)
Addendum  created 05/16/20 1337 by Catalina Gravel, MD   Intraprocedure Staff edited

## 2020-05-16 NOTE — Evaluation (Signed)
Occupational Therapy Evaluation Patient Details Name: Jessica [name] MRN: [id number] DOB: 29-May-1932 Today's Date: 05/16/2020    History of Present Illness 86 y.o. female who is s/p right femoral endarterectomy after Mynx closure of right groin access and loss of femoral pulse, ischemic RLE. PMH includes HTN, chronic bronchitis.   Clinical Impression   PTA pt living alone, with as needed assist from family living close by. At time of eval, pt completed transfers with mod I assist and functional mobility a household distance without AD. Pt was able to stand at sink to complete 50% of bathing tasks before needing to sit down. She then completed toilet transfer and hygiene without external assist. Educated pt on use of shower chair in home for safety. She remains mildly unsteady and easily fatigued, will continue to follow while acute per POC listed below. Suspect pt will progress well without OT follow up.     Follow Up Recommendations  No OT follow up;Supervision - Intermittent    Equipment Recommendations  None recommended by OT    Recommendations for Other Services       Precautions / Restrictions Precautions Precautions: Fall Restrictions Weight Bearing Restrictions: No      Mobility Bed Mobility               General bed mobility comments: sitting EOB on arrival  Transfers Overall transfer level: Needs assistance Equipment used: None Transfers: Sit to/from Stand Sit to Stand: Modified independent (Device/Increase time)              Balance Overall balance assessment: Mild deficits observed, not formally tested                                         ADL either performed or assessed with clinical judgement   ADL Overall ADL's : Modified independent                                       General ADL Comments: Pt presents with ability to complete BADL at mod I level. Pt stood at sink to perform UB bathing, then finished LB  bathing while seated. She was able to don/doff socks without external assist. She then completed functional mobility in the hall beyond household distance.     Vision Baseline Vision/History: Wears glasses Wears Glasses: Reading only Patient Visual Report: No change from baseline       Perception     Praxis      Pertinent Vitals/Pain Pain Assessment: Faces Faces Pain Scale: Hurts a little bit Pain Location: R groin Pain Descriptors / Indicators: Aching Pain Intervention(s): Monitored during session     Hand Dominance     Extremity/Trunk Assessment Upper Extremity Assessment Upper Extremity Assessment: Overall WFL for tasks assessed   Lower Extremity Assessment Lower Extremity Assessment: Defer to PT evaluation       Communication Communication Communication: No difficulties   Cognition Arousal/Alertness: Awake/alert Behavior During Therapy: WFL for tasks assessed/performed Overall Cognitive Status: Within Functional Limits for tasks assessed                                     General Comments       Exercises     Shoulder Instructions  Home Living Family/patient expects to be discharged to:: Private residence Living Arrangements: Alone Available Help at Discharge: Family;Available PRN/intermittently Type of Home: House Home Access: Stairs to enter [employer] of Steps: 1 then 2   Home Layout: One level     Bathroom Shower/Tub: Teacher, early years/pre: Standard     Home Equipment: None          Prior Functioning/Environment Level of Independence: Independent                 OT Problem List: Decreased knowledge of use of DME or AE;Decreased knowledge of precautions;Decreased activity tolerance;Impaired balance (sitting and/or standing)      OT Treatment/Interventions: Self-care/ADL training;Therapeutic exercise;Patient/family education;Balance training;Energy conservation;Therapeutic activities;DME  and/or AE instruction    OT Goals(Current goals can be found in the care plan section) Acute Rehab OT Goals Patient Stated Goal: return to independence OT Goal Formulation: With patient Time For Goal Achievement: 05/30/20 Potential to Achieve Goals: Good  OT Frequency: Min 2X/week   Barriers to D/C:            Co-evaluation              AM-PAC OT "6 Clicks" Daily Activity     Outcome Measure Help from another person eating meals?: None Help from another person taking care of personal grooming?: None Help from another person toileting, which includes using toliet, bedpan, or urinal?: None Help from another person bathing (including washing, rinsing, drying)?: None Help from another person to put on and taking off regular upper body clothing?: None Help from another person to put on and taking off regular lower body clothing?: None 6 Click Score: 24   End of Session Nurse Communication: Mobility status  Activity Tolerance: Patient tolerated treatment well Patient left: Other (comment) (in hallway working with PT)  OT Visit Diagnosis: Unsteadiness on feet (R26.81);Other abnormalities of gait and mobility (R26.89)                Time: 9179-1505 OT Time Calculation (min): 30 min Charges:  OT General Charges $OT Visit: 1 Visit OT Evaluation $OT Eval Moderate Complexity: 1 Mod OT Treatments $Self Care/Home Management : 8-22 mins  Zenovia Jarred, MSOT, OTR/L Acute Rehabilitation Services [facility] Office Number: [phone] Pager: [phone]  Zenovia Jarred 05/16/2020, 12:24 PM

## 2020-05-16 NOTE — Progress Notes (Signed)
PHARMACIST LIPID MONITORING   ESTEE [name] is a 86 y.o. female admitted on 05/15/2020 with peripheral vascular disease.  Pharmacy has been consulted to optimize lipid-lowering therapy with the indication of secondary prevention for clinical ASCVD.  Recent Labs:  Lipid Panel (last 6 months):   Lab Results  Component Value Date   CHOL 110 05/16/2020   TRIG 147 05/16/2020   HDL 39 (L) 05/16/2020   CHOLHDL 2.8 05/16/2020   VLDL 29 05/16/2020   LDLCALC 42 05/16/2020    Hepatic function panel (last 6 months):   Lab Results  Component Value Date   AST 25 03/15/2020   ALT 20 03/15/2020   ALKPHOS 72 03/15/2020   BILITOT 0.9 03/15/2020    SCr (since admission):   Serum creatinine: 1.03 mg/dL (H) 05/16/20 0048 Estimated creatinine clearance: 23.5 mL/min (A)  Current lipid-lowering therapy: fenofibrate 160mg  daily Previous lipid-lowering therapies (if applicable): none Documented or reported allergies or intolerances to lipid-lowering therapies (if applicable): none  Assessment:  Patient agrees with changes to lipid-lowering therapy  Recommendation per protocol:  -Add statin therapy  Follow-up with:  Primary care provider - Reynold Bowen, MD  Follow-up labs after discharge:    Liver function panel and lipid panel in 8-12 weeks then annually  Plan: -Add atorvastatin 20mg  daily - deferred high-intensity with LDL 42 and low body weight of 37kg -Continue fenofibrate for now - could consider stopping in the future (hx high triglycerides)   [name], PharmD, BCPS Clinical Pharmacist [phone] Please check AMION for all [facility] numbers 05/16/2020

## 2020-05-16 NOTE — Evaluation (Signed)
Physical Therapy Evaluation Patient Details Name: Jessica [name] MRN: [id number] DOB: Jan 31, 1932 Today's Date: 05/16/2020   History of Present Illness  86 y.o. female who is s/p celiac artery angioplasty with stenting, s/p right femoral endarterectomy after Mynx closure of right groin access and loss of femoral pulse, ischemic RLE. PMH includes HTN, chronic bronchitis.  Clinical Impression   Pt presents with mild RLE post-op pain, mild unsteadiness in standing with no LOB, and decreased activity tolreance vs baseline. Pt to benefit from acute PT to address deficits. Pt ambulated hallway distance without AD and increased time, pt limited in distance by fatigue. PT recommended for pt to have supervision from daughter for mobility upon d/c from acute setting, pt states her daughter will be staying with her over the next few days. Pt with no post-acute PT needs, and is close to baseline. PT to progress mobility as tolerated, and will continue to follow acutely.      Follow Up Recommendations Supervision for mobility/OOB    Equipment Recommendations  None recommended by PT    Recommendations for Other Services       Precautions / Restrictions Precautions Precautions: Fall Restrictions Weight Bearing Restrictions: No      Mobility  Bed Mobility               General bed mobility comments: up with OT upon PT arrival  Transfers Overall transfer level: Needs assistance Equipment used: None Transfers: Sit to/from Stand Sit to Stand: Modified independent (Device/Increase time)         General transfer comment: up with OT upon PT arrival  Ambulation/Gait Ambulation/Gait assistance: Supervision Gait Distance (Feet): 325 Feet Assistive device: None Gait Pattern/deviations: Step-through pattern;Decreased stride length;Trunk flexed;Narrow base of support Gait velocity: decr   General Gait Details: supervision for safety, pt with slight unsteadiness but no overt LOB. VSS during  mobility  Stairs            Wheelchair Mobility    Modified Rankin (Stroke Patients Only)       Balance Overall balance assessment: Mild deficits observed, not formally tested                                           Pertinent Vitals/Pain Pain Assessment: Faces Faces Pain Scale: Hurts a little bit Pain Location: R groin Pain Descriptors / Indicators: Aching;Sore Pain Intervention(s): Limited activity within patient's tolerance;Monitored during session;Repositioned    Home Living Family/patient expects to be discharged to:: Private residence Living Arrangements: Alone Available Help at Discharge: Family;Available PRN/intermittently (daughter to be available for the first few days of pt d/c) Type of Home: House Home Access: Stairs to enter   [employer] of Steps: 1 then 2 Home Layout: One level Home Equipment: None      Prior Function Level of Independence: Independent         Comments: pt has a dog, enjoys doing yard work     Journalist, newspaper   Dominant Hand: Right    Extremity/Trunk Assessment   Upper Extremity Assessment Upper Extremity Assessment: Defer to OT evaluation    Lower Extremity Assessment Lower Extremity Assessment: Overall WFL for tasks assessed    Cervical / Trunk Assessment Cervical / Trunk Assessment: Normal  Communication   Communication: No difficulties  Cognition Arousal/Alertness: Awake/alert Behavior During Therapy: WFL for tasks assessed/performed Overall Cognitive Status: Within Functional Limits for tasks assessed  General Comments      Exercises     Assessment/Plan    PT Assessment Patient needs continued PT services  PT Problem List Decreased strength;Decreased mobility;Decreased activity tolerance;Decreased balance;Pain       PT Treatment Interventions Therapeutic activities;Gait training;Therapeutic exercise;Patient/family  education;Balance training;Functional mobility training;Neuromuscular re-education;Stair training    PT Goals (Current goals can be found in the Care Plan section)  Acute Rehab PT Goals Patient Stated Goal: go home with family support PT Goal Formulation: With patient Time For Goal Achievement: 05/30/20 Potential to Achieve Goals: Good    Frequency Min 3X/week   Barriers to discharge        Co-evaluation               AM-PAC PT "6 Clicks" Mobility  Outcome Measure Help needed turning from your back to your side while in a flat bed without using bedrails?: None Help needed moving from lying on your back to sitting on the side of a flat bed without using bedrails?: None Help needed moving to and from a bed to a chair (including a wheelchair)?: None Help needed standing up from a chair using your arms (e.g., wheelchair or bedside chair)?: None Help needed to walk in hospital room?: A Little Help needed climbing 3-5 steps with a railing? : A Little 6 Click Score: 22    End of Session   Activity Tolerance: Patient tolerated treatment well Patient left: in chair;with call bell/phone within reach Nurse Communication: Mobility status PT Visit Diagnosis: Unsteadiness on feet (R26.81);Difficulty in walking, not elsewhere classified (R26.2)    Time: 4742-5956 PT Time Calculation (min) (ACUTE ONLY): 21 min   Charges:   PT Evaluation $PT Eval Low Complexity: 1 Low          Karianna Gusman E, PT Acute Rehabilitation Services Pager [phone]  Office [phone]   Braycen Burandt D Elonda Husky 05/16/2020, 12:55 PM

## 2020-05-17 MED ORDER — ATORVASTATIN CALCIUM 20 MG PO TABS: 20.0000 mg | ORAL_TABLET | Freq: Every day | ORAL | 11 refills | Status: DC

## 2020-05-17 MED ORDER — HYDROCODONE-ACETAMINOPHEN 10-325 MG PO TABS: 1.0000 | ORAL_TABLET | Freq: Four times a day (QID) | ORAL | 0 refills | Status: DC | PRN

## 2020-05-17 NOTE — Progress Notes (Signed)
Discharged to home with family office visits in place teaching done  

## 2020-05-17 NOTE — Plan of Care (Signed)
Plan of care reviewed. Pt's been progressing. No acute distress noted.   Problem: Clinical Measurements: Goal: Will remain free from infection Outcome: Progressing: remained afebrile.   Problem: Clinical Measurements: Goal: Respiratory complications will improve Outcome: Progressing: on room air, no SOB , SPO2 98%, auscultated lung clear bilaterally.   Problem: Clinical Measurements: Goal: Cardiovascular complication will be avoided Outcome: Progressing: she's hemodynamically stable, . NSR on monitor, BP within normal limits.    Problem: Elimination: Goal: Will not experience complications related to bowel motility Outcome: Progressing: She had smooth and good BM today. Complained about mind epigastric pain 4-5/10 scale, no bloating, denied indigestion or heart burn, denied nausea and vomiting. Stated she had good appetite. Positive flatus and active bowel sound all 4 quadrants. No abdominal distention, no guarding, no rebound. After pain med given, she's able to rest well and symptoms relieved. We will continue to monitor.   Problem: Pain Managment: Goal: General experience of comfort will improve Outcome: Progressing. Percocet given Prn. Pain tolerated well.   Problem: Skin Integrity: Goal: Risk for impaired skin integrity will decrease Outcome: Progressing: right groin incision site with skin glue, no drainage, no hematoma.   Baltazar Najjar, RN

## 2020-05-17 NOTE — Discharge Summary (Signed)
Discharge Summary  Patient ID: Jessica [name] [id number] 87 y.o. March 10, 1932  Admit date: 05/15/2020  Discharge date and time: 05/17/2020 11:09 AM   Admitting Physician: Marty Heck, MD   Discharge Physician: same  Admission Diagnoses: Ischemia of right lower extremity [I99.8]  Discharge Diagnoses: same  Admission Condition: fair  Discharged Condition: fair  Indication for Admission: ischemic RLE following celiac artery stenting  Hospital Course: Ms. Jessica [name] is a 86 year old female who was brought in as an outpatient and underwent mesenteric angiogram and celiac artery stenting.  Postoperatively she was noted to have an ischemic right lower extremity.  She was brought emergently to the operating room and underwent repair of right common femoral artery.  She was admitted to the hospital postoperatively.  POD #1 right lower extremity is well-perfused she is tolerating a regular diet.  POD #2 patient states she still has abdominal pain however it is not associated with any meals.  At the time of discharge she has a palpable right DP pulse.  She will need to continue her Plavix.  She will follow up with GI as an outpatient for further work-up for abdominal pain.  She will follow-up with Dr. Carlis Abbott in 3 to 4 weeks with mesenteric duplex.  She will be discharged in stable condition.  Consults: None  Treatments: surgery: Celiac artery stenting and R CFA repair by Dr. Carlis Abbott on 05/15/20  Discharge Exam: See progress note 05/17/20 Vitals:   05/17/20 0819 05/17/20 0843  BP: (!) 127/53   Pulse: 66   Resp: 17   Temp: 98.2 F (36.8 C)   SpO2: 99% 98%     Disposition: Discharge disposition: 01-Home or Self Care       Patient Instructions:  Allergies as of 05/17/2020   No Known Allergies     Medication List    TAKE these medications   amLODipine 5 MG tablet Commonly known as: NORVASC Take 5 mg by mouth daily. What changed: Another medication with the same name was  removed. Continue taking this medication, and follow the directions you see here.   atorvastatin 20 MG tablet Commonly known as: LIPITOR Take 1 tablet (20 mg total) by mouth daily.   clopidogrel 75 MG tablet Commonly known as: Plavix Take 1 tablet (75 mg total) by mouth daily.   fenofibrate 160 MG tablet Take 160 mg by mouth daily.   HYDROcodone-acetaminophen 10-325 MG tablet Commonly known as: NORCO Take 1 tablet by mouth every 6 (six) hours as needed for moderate pain.   losartan 100 MG tablet Commonly known as: COZAAR Take 100 mg by mouth daily.   mirtazapine 15 MG tablet Commonly known as: REMERON Take 15 mg by mouth at bedtime.   montelukast 10 MG tablet Commonly known as: SINGULAIR Take 10 mg by mouth daily.   MULTIVITAMIN PO Take 1 tablet by mouth daily.   omeprazole 40 MG capsule Commonly known as: PRILOSEC Take 1 capsule (40 mg total) by mouth in the morning and at bedtime.   propranolol ER 120 MG 24 hr capsule Commonly known as: INDERAL LA Take 120 mg by mouth daily.   urea 40 % Crea Commonly known as: CARMOL Apply 1 application topically daily as needed for rash.   Wixela Inhub 100-50 MCG/DOSE Aepb Generic drug: Fluticasone-Salmeterol Inhale 1 puff into the lungs 2 (two) times daily.      Activity: activity as tolerated Diet: regular diet Wound Care: keep wound clean and dry  Follow-up with Dr. Carlis Abbott in 3 weeks.  Signed: Dagoberto Ligas, PA-C 05/17/2020 3:51 PM [facility] Office: [phone]

## 2020-05-17 NOTE — Progress Notes (Addendum)
  Progress Note    05/17/2020 7:41 AM 2 Days Post-Op  Subjective:  Abd pain similar to pre-op.  Not associated with meals   Vitals:   05/16/20 2324 05/17/20 0448  BP: (!) 152/53 (!) 163/70  Pulse: 76 76  Resp: 20 20  Temp: 98.1 F (36.7 C) 98.2 F (36.8 C)  SpO2: 98% 96%   Physical Exam: Lungs:  Non labored Incisions:  R groin incisions c/d/i Extremities:  Palpable R DP pulse Abdomen:  Soft, tender to deep palpation lower quadrants Neurologic: A&O  CBC    Component Value Date/Time   WBC 6.9 05/16/2020 0048   RBC 2.93 (L) 05/16/2020 0048   HGB 8.6 (L) 05/16/2020 0048   HCT 27.2 (L) 05/16/2020 0048   PLT 343 05/16/2020 0048   MCV 92.8 05/16/2020 0048   MCH 29.4 05/16/2020 0048   MCHC 31.6 05/16/2020 0048   RDW 14.2 05/16/2020 0048   LYMPHSABS 2.3 06/27/2015 0025   MONOABS 0.6 06/27/2015 0025   EOSABS 0.3 06/27/2015 0025   BASOSABS 0.1 06/27/2015 0025    BMET    Component Value Date/Time   NA 135 05/16/2020 0048   K 4.4 05/16/2020 0048   CL 105 05/16/2020 0048   CO2 19 (L) 05/16/2020 0048   GLUCOSE 329 (H) 05/16/2020 0048   BUN 28 (H) 05/16/2020 0048   CREATININE 1.03 (H) 05/16/2020 0048   CALCIUM 8.4 (L) 05/16/2020 0048   GFRNONAA 50 (L) 05/16/2020 0048   GFRAA 57 (L) 05/16/2020 0048    INR    Component Value Date/Time   INR 1.03 06/27/2015 0025     Intake/Output Summary (Last 24 hours) at 05/17/2020 0741 Last data filed at 05/16/2020 2001 Gross per 24 hour  Intake 610 ml  Output --  Net 610 ml     Assessment/Plan:  86 y.o. female is s/p celiac stent; R CFA repair 2 Days Post-Op   RLE well perfused with palpable DP pulse Abd pain similar to pre-op however not associated with timing of meals Recommend outpatient follow up with GI for gastritis We will d/c home today on plavix   Dagoberto Ligas, PA-C [facility] [phone] 05/17/2020 7:41 AM  Postop day 2 status post celiac artery stent and subsequent right femoral  endarterectomy for occluded right common femoral artery after transfemoral access for mesenteric intervention.  Right groin looks clean dry and intact.  She has a palpable right femoral pulse and a palpable right dorsalis pedis pulse.  Walked twice yesterday.  Still having some abdominal pain similar to pre-op and has chronic gastritis on EGD.  Previously discussed abdominal pain may not resolve with treatment of mesenteric stenosis if all driven by chronic gastritis.  Plan for d/c home today on Plavix.  Arrange follow-up 3-4 weeks groin check and mesenteric duplex.  Marty Heck, MD [facility] [place] Office: [phone]

## 2020-05-22 ENCOUNTER — Telehealth: Payer: Self-pay

## 2020-05-22 NOTE — Progress Notes (Signed)
POST OPERATIVE OFFICE NOTE    CC:  F/u for surgery  HPI:  This is a 86 y.o. female who is s/p right Celiac stent and right common femoral artery repair on 05/15/2020 by Dr. Carlis Abbott for ischemic RLE after right common femoral artery percutaneous access for mesenteric artery intervention.  She had her aortogram on 05/15/2020 and at that time underwent celiac artery angioplasty with stent placement also by Dr. Carlis Abbott.   Pt returns today with complaints of swelling and firmness in the right groin area around surgical incision. She states this appeared 3-4 days after she was discharged home. The right groin is still tender from surgery but more so she is concerned about the swelling and firmness around it. She denies any redness, warmth, drainage, or bruising. She denies any pain in right leg or foot. She has been ambulating without difficulty. She denies any fever or chills  She continues to do well following her celiac artery angioplasty and stenting. She says her appetite and energy are both improving. She is tolerating diet. She is still having some abdominal pains but much improved after intervention. The abdominal pain is not associated with eating. She has follow up with GI. She remains on her Statin and Plavix  No Known Allergies  Current Outpatient Medications  Medication Sig Dispense Refill  . amLODipine (NORVASC) 5 MG tablet Take 5 mg by mouth daily.    Marland Kitchen atorvastatin (LIPITOR) 20 MG tablet Take 1 tablet (20 mg total) by mouth daily. 30 tablet 11  . clopidogrel (PLAVIX) 75 MG tablet Take 1 tablet (75 mg total) by mouth daily. 30 tablet 0  . fenofibrate 160 MG tablet Take 160 mg by mouth daily.    Marland Kitchen gabapentin (NEURONTIN) 100 MG capsule Take 100 mg by mouth 2 (two) times daily as needed.    Marland Kitchen HYDROcodone-acetaminophen (NORCO) 10-325 MG tablet Take 1 tablet by mouth every 6 (six) hours as needed for moderate pain. 10 tablet 0  . losartan (COZAAR) 100 MG tablet Take 100 mg by mouth daily.       . mirtazapine (REMERON) 15 MG tablet Take 15 mg by mouth at bedtime.    . montelukast (SINGULAIR) 10 MG tablet Take 10 mg by mouth daily.  6  . Multiple Vitamins-Minerals (MULTIVITAMIN PO) Take 1 tablet by mouth daily.     Marland Kitchen omeprazole (PRILOSEC) 40 MG capsule Take 1 capsule (40 mg total) by mouth in the morning and at bedtime. 90 capsule 3  . propranolol ER (INDERAL LA) 120 MG 24 hr capsule Take 120 mg by mouth daily.  3  . urea (CARMOL) 40 % CREA Apply 1 application topically daily as needed for rash.    Grant Ruts INHUB 100-50 MCG/DOSE AEPB Inhale 1 puff into the lungs 2 (two) times daily.     No current facility-administered medications for this visit.     ROS:  See HPI  Physical Exam: Vitals:   05/23/20 0931  BP: 133/69  Pulse: 74  Resp: 20  Temp: (!) 97 F (36.1 C)  SpO2: 98%  General: very thin, elderly, pleasant female in no acute distress Incision:  Right groin incision clean, dry and intact. Healing well. There is an area of fullness surrounding the incision approximately 3 cm x 5 cm. Mildly tender to palpation. No erythema. No ecchymosis. No warmth or drainage Extremities:  2+ femoral pulses bilaterally. Palpable AT bilaterally. Coolness of feet and toes bilaterally however this secondary to being in cold room without socks. Some  cyanosis of toes as well Neuro: alert and oriented Abdomen:  Soft, non tender, non distended, normal bowel sounds  Assessment/Plan:  This is a 86 y.o. female who is s/p: right Celiac stent and right common femoral artery repair on 05/15/2020 by Dr. Carlis Abbott for ischemic RLE after right common femoral artery percutaneous access for mesenteric artery intervention. She had her aortogram on 05/15/2020 and at that time underwent celiac artery angioplasty with stent placement also by Dr. Carlis Abbott who returns today for c/o of swelling and firmness in right groin. She has expected surgical site tenderness. No signs of infection in the right groin.She does have what  appears to be a small seroma. I have advised her to use some warm compresses in this area daily. Her lower extremities are otherwise well perfused. She is doing well following her celiac artery angioplasty and stenting. She is tolerating diet and improved abdominal pain - She will continue her Statin and Plavix - Advised her to call for earlier follow up if she has increased concerns of the appearance of the right groin such as redness, warmth, drainage, increased pain, fever or chills -She will keep her follow up appointment with Dr. Carlis Abbott on 7/6   Paulo Fruit, PA-C [facility] [phone]  Clinic MD:  Oneida Alar

## 2020-05-22 NOTE — Telephone Encounter (Signed)
Pt called triage with c/o groin area near incision swollen, tender and hard to touch. She denies any redness, warmth, drainage, fever. Per PA we have worked her in for an appt tomorrow. Offered her one today but she has refused. She will let us know if anything changes.

## 2020-05-23 ENCOUNTER — Ambulatory Visit (INDEPENDENT_AMBULATORY_CARE_PROVIDER_SITE_OTHER): Payer: Self-pay | Admitting: Physician Assistant

## 2020-05-23 ENCOUNTER — Other Ambulatory Visit: Payer: Self-pay

## 2020-05-23 VITALS — BP 133/69 | HR 74 | Temp 97.0°F | Resp 20 | Ht 61.0 in | Wt 81.8 lb

## 2020-05-23 DIAGNOSIS — I75021 Atheroembolism of right lower extremity: Secondary | ICD-10-CM

## 2020-05-23 DIAGNOSIS — K551 Chronic vascular disorders of intestine: Secondary | ICD-10-CM

## 2020-05-29 ENCOUNTER — Ambulatory Visit (INDEPENDENT_AMBULATORY_CARE_PROVIDER_SITE_OTHER): Payer: Self-pay | Admitting: Physician Assistant

## 2020-05-29 ENCOUNTER — Other Ambulatory Visit: Payer: Self-pay

## 2020-05-29 ENCOUNTER — Telehealth: Payer: Self-pay

## 2020-05-29 VITALS — BP 110/56 | HR 75 | Temp 97.8°F | Resp 14 | Ht 61.0 in | Wt 83.0 lb

## 2020-05-29 DIAGNOSIS — I75021 Atheroembolism of right lower extremity: Secondary | ICD-10-CM

## 2020-05-29 DIAGNOSIS — K551 Chronic vascular disorders of intestine: Secondary | ICD-10-CM

## 2020-05-29 DIAGNOSIS — L7634 Postprocedural seroma of skin and subcutaneous tissue following other procedure: Secondary | ICD-10-CM

## 2020-05-29 NOTE — Telephone Encounter (Signed)
Triage call from pt. Stated she tried warm compresses to Rt groin as recommended from last OV on 6/24 but hasn't helped. Pt was told had small seroma. Reports increased swelling around incision described as "elongated" , hardness and now redness at incision. Denies warmth to area, fever, pain or discharge. Pt scheduled for wound check today.

## 2020-05-29 NOTE — Progress Notes (Signed)
POST OPERATIVE OFFICE NOTE    CC:  F/u for surgery  HPI:  This is a 86 y.o. female who is s/p s/p Celiac artery stent and right common femoral artery repair on 05/15/2020 by Dr. Carlis Abbott for ischemic RLE after right common femoral artery percutaneous access for mesenteric artery intervention.  She had her aortogram on 05/15/2020 and at that time underwent celiac artery angioplasty with stent placement also by Dr. Carlis Abbott. She continues to do well following her celiac artery angioplasty and stenting. She says her appetite and energy are both improving. She is tolerating diet. She is still having some abdominal pains but much improved after intervention. The abdominal pain is not associated with eating. She has follow up with GI. She remains on her Statin and Plavix  I saw her in follow up on 05/23/20 with complaints of swelling and firmness in the right groin area around surgical incision. This had appeared 3-4 days after she was discharged home. The right groin remained still tender from surgery. She did not have any redness, warmth, drainage, or bruising. She was not having any pain in right leg or foot. She was ambulating without difficulty. She denied any fever or chills. She was advised to use some warm compresses and monitor it for any signs of infection and follow up with Dr. Carlis Abbott on 7/6  She presents today with concerns of worsening swelling and redness in the right groin with some tenderness now in the right upper thigh. She feels that the area below the incision line is where she has more fullness. She says it is now sore when she bends her leg. She denies any fever or chills. She is very concerned that this area is infected.    No Known Allergies  Current Outpatient Medications  Medication Sig Dispense Refill  . amLODipine (NORVASC) 5 MG tablet Take 5 mg by mouth daily.    Marland Kitchen atorvastatin (LIPITOR) 20 MG tablet Take 1 tablet (20 mg total) by mouth daily. 30 tablet 11  . clopidogrel (PLAVIX)  75 MG tablet Take 1 tablet (75 mg total) by mouth daily. 30 tablet 0  . fenofibrate 160 MG tablet Take 160 mg by mouth daily.    Marland Kitchen gabapentin (NEURONTIN) 100 MG capsule Take 100 mg by mouth 2 (two) times daily as needed.    Marland Kitchen HYDROcodone-acetaminophen (NORCO) 10-325 MG tablet Take 1 tablet by mouth every 6 (six) hours as needed for moderate pain. 10 tablet 0  . losartan (COZAAR) 100 MG tablet Take 100 mg by mouth daily.     . mirtazapine (REMERON) 15 MG tablet Take 15 mg by mouth at bedtime.    . montelukast (SINGULAIR) 10 MG tablet Take 10 mg by mouth daily.  6  . Multiple Vitamins-Minerals (MULTIVITAMIN PO) Take 1 tablet by mouth daily.     Marland Kitchen omeprazole (PRILOSEC) 40 MG capsule Take 1 capsule (40 mg total) by mouth in the morning and at bedtime. 90 capsule 3  . propranolol ER (INDERAL LA) 120 MG 24 hr capsule Take 120 mg by mouth daily.  3  . urea (CARMOL) 40 % CREA Apply 1 application topically daily as needed for rash.    Grant Ruts INHUB 100-50 MCG/DOSE AEPB Inhale 1 puff into the lungs 2 (two) times daily.     No current facility-administered medications for this visit.     ROS:  See HPI  Physical Exam:  Vitals:   05/29/20 1418  BP: (!) 110/56  Pulse: 75  Resp: 14  Temp: 97.8 F (36.6 C)  TempSrc: Temporal  SpO2: 97%  Weight: 83 lb (37.6 kg)  Height: 5\' 1"  (1.549 m)   General: very thin, elderly, pleasant female in no acute distress Incision:  Right groin incision clean, dry and intact. Healing well. There is an area of fullness surrounding the incision approximately 3 cm x 5 cm. Mildly tender to palpation. No erythema. No ecchymosis. No warmth or drainage    Extremities:  2+ femoral pulses bilaterally. Palpable AT bilaterally. Feet warm and well perfused Neuro: alert and oriented Abdomen:  Soft, non tender, non distended, normal bowel sounds   Assessment/Plan:  This is a 86 y.o. female who is s/p Celiac artery stent and right common femoral artery repair on 05/15/2020 by  Dr. Carlis Abbott for ischemic RLE after right common femoral artery percutaneous access for mesenteric artery intervention. She had her aortogram on 05/15/2020 and at that time underwent celiac artery angioplasty with stent placement also by Dr. Carlis Abbott. She returns again today for c/o of swelling, redness and firmness in right groin. Clinically the groin is unchanged from her last visit. There is no signs of infection. She has expected surgical site tenderness. She does have what appears to be a small seroma vs lymphocele. Her lower extremities are otherwise well perfused. She is doing well following her celiac artery angioplasty and stenting. She is tolerating diet and improved abdominal pain - She will continue her Statin and Plavix - Advised her to call for earlier follow up if she has increased concerns of the appearance of the right groin such as redness, warmth, drainage, increased pain, fever or chills - Dr. Oneida Alar came in to help provide patient with reassurance that this is not life threatening and she does not need any emergent surgery or intervention on her right groin. We both discussed with her option to have I&D/ evacuation of this area if she wanted vs watchful waiting and she is agreeable to wait and see if this improves -She will keep her follow up appointment with Dr. Carlis Abbott on 7/20    Karoline Caldwell, PA-C [facility] [phone]  Clinic MD: Dr. Oneida Alar

## 2020-06-04 ENCOUNTER — Encounter: Payer: Medicare Other | Admitting: Vascular Surgery

## 2020-06-06 DIAGNOSIS — B0223 Postherpetic polyneuropathy: Secondary | ICD-10-CM | POA: Diagnosis not present

## 2020-06-06 DIAGNOSIS — R634 Abnormal weight loss: Secondary | ICD-10-CM | POA: Diagnosis not present

## 2020-06-06 DIAGNOSIS — Z79891 Long term (current) use of opiate analgesic: Secondary | ICD-10-CM | POA: Diagnosis not present

## 2020-06-13 ENCOUNTER — Other Ambulatory Visit: Payer: Self-pay | Admitting: *Deleted

## 2020-06-13 DIAGNOSIS — K551 Chronic vascular disorders of intestine: Secondary | ICD-10-CM

## 2020-06-18 ENCOUNTER — Ambulatory Visit (INDEPENDENT_AMBULATORY_CARE_PROVIDER_SITE_OTHER): Payer: Self-pay | Admitting: Vascular Surgery

## 2020-06-18 ENCOUNTER — Encounter: Payer: Self-pay | Admitting: Vascular Surgery

## 2020-06-18 ENCOUNTER — Ambulatory Visit (HOSPITAL_COMMUNITY)
Admission: RE | Admit: 2020-06-18 | Discharge: 2020-06-18 | Disposition: A | Discharge status: Home / Self Care | Payer: Medicare Other | Source: Ambulatory Visit | Attending: Vascular Surgery | Admitting: Vascular Surgery

## 2020-06-18 ENCOUNTER — Other Ambulatory Visit: Payer: Self-pay

## 2020-06-18 ENCOUNTER — Ambulatory Visit (INDEPENDENT_AMBULATORY_CARE_PROVIDER_SITE_OTHER)
Admission: RE | Admit: 2020-06-18 | Discharge: 2020-06-18 | Disposition: A | Discharge status: Home / Self Care | Payer: Medicare Other | Source: Ambulatory Visit | Attending: Vascular Surgery | Admitting: Vascular Surgery

## 2020-06-18 ENCOUNTER — Other Ambulatory Visit: Payer: Self-pay | Admitting: *Deleted

## 2020-06-18 VITALS — BP 135/66 | HR 65 | Temp 94.6°F | Resp 14 | Ht 62.0 in | Wt 81.4 lb

## 2020-06-18 DIAGNOSIS — I75021 Atheroembolism of right lower extremity: Secondary | ICD-10-CM

## 2020-06-18 DIAGNOSIS — K551 Chronic vascular disorders of intestine: Secondary | ICD-10-CM | POA: Insufficient documentation

## 2020-06-18 NOTE — Progress Notes (Signed)
Patient name: Jessica [name] MRN: [id number] DOB: May 17, 1932 Sex: female  REASON FOR VISIT: 1 month f/u after celiac artery stent  HPI: Jessica [name] is a 86 y.o. 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  Her SMA was widely patent after evaluation with mesenteric arteriogram and SMA intervention was not performed.  Her transfemoral access was complicated by closure complication in the right groin after mynx deployment requiring right femoral thromboembolectomy and bovine patch on the same day as her celiac stent on 05/15/2020.  She states her abdominal pain is completely resolved since the celiac artery stent placement.  States she can eat whatever she wants.  No postprandial pain.  Seems pleased with her progress.  Has noted swelling in the right groin and has been told this is either a lymphocele or hematoma.  She is having no right leg symptoms when she walks.  As previously noted when I initially evaluated her she had 3 months of epigastric pain and an EGD in April that showed diffuse inflammation of the gastric body and antrum pathology consistent with chronic gastritis.  She been compliant with omeprazole for 3 months with no improvement.  Mesenteric duplex was concerning for high-grade SMA stenosis and celiac artery stenosis.  Past Medical History:  Diagnosis Date  . Anemia   . Arthritis    "thumbs" (06/27/2015)  . Bilateral renal artery stenosis ([place])   . Cataract   . Chronic bronchitis ([place])   . Chronic lower back pain   . GERD (gastroesophageal reflux disease)   . Headache    "probably weekly" (06/27/2015)  . Hypertension   . Hypertriglyceridemia   . Migraine    "maybe monthly" (06/27/2015)    Past Surgical History:  Procedure Laterality Date  . BALLOON DILATION  1980's?   "for renal stenosis"  .  CATARACT EXTRACTION, BILATERAL    . ENDARTERECTOMY FEMORAL Right 05/15/2020   Procedure: Right Common Femoral Endarterectomy;  Surgeon: Marty Heck, MD;  Location: [facility];  Service: Vascular;  Laterality: Right;  . PATCH ANGIOPLASTY Right 05/15/2020   Procedure: Common Femoral Artery Patch Angioplasty using XenoSure Bovine Pericardial Patch;  Surgeon: Marty Heck, MD;  Location: [facility];  Service: Vascular;  Laterality: Right;  . PERIPHERAL VASCULAR INTERVENTION  05/15/2020   Procedure: PERIPHERAL VASCULAR INTERVENTION;  Surgeon: Marty Heck, MD;  Location: [facility];  Service: Cardiovascular;;  Celiac  . RECONSTRUCTION OF EYELID Bilateral 01/2016   Upper and lower eyelid  . RENAL ANGIOPLASTY    . TOE SURGERY Bilateral    "straightened big toe"  . TONSILLECTOMY  ~ 1941  . VAGINAL HYSTERECTOMY  1973  . VISCERAL ANGIOGRAPHY N/A 05/15/2020   Procedure: MESENTERIC  ANGIOGRAPHY;  Surgeon: Marty Heck, MD;  Location: [facility];  Service: Cardiovascular;  Laterality: N/A;    Family History  Problem Relation Age of Onset  . Hypertension Brother   . Hyperlipidemia Brother   . Parkinsonism Father   . Stroke Maternal Grandfather   . Colon cancer Neg Hx   . Colon polyps Neg Hx   . Stomach cancer Neg Hx     SOCIAL HISTORY: Social History   Tobacco Use  . Smoking status: Former Smoker    Packs/day: 0.50    Years: 20.00    Pack years: 10.00  Types: Cigarettes  . Smokeless tobacco: Never Used  . Tobacco comment: 'quit smoking in the 1990's?"  Substance Use Topics  . Alcohol use: Yes    Alcohol/week: 0.0 - 1.0 standard drinks    Comment: rarely    No Known Allergies  Current Outpatient Medications  Medication Sig Dispense Refill  . amLODipine (NORVASC) 5 MG tablet Take 5 mg by mouth daily.    Marland Kitchen atorvastatin (LIPITOR) 20 MG tablet Take 1 tablet (20 mg total) by mouth daily. 30 tablet 11  . clopidogrel (PLAVIX) 75 MG tablet Take 1  tablet (75 mg total) by mouth daily. 30 tablet 0  . fenofibrate 160 MG tablet Take 160 mg by mouth daily.    Marland Kitchen gabapentin (NEURONTIN) 100 MG capsule Take 100 mg by mouth 2 (two) times daily as needed.    Marland Kitchen HYDROcodone-acetaminophen (NORCO) 10-325 MG tablet Take 1 tablet by mouth every 6 (six) hours as needed for moderate pain. 10 tablet 0  . losartan (COZAAR) 100 MG tablet Take 100 mg by mouth daily.     . mirtazapine (REMERON) 15 MG tablet Take 15 mg by mouth at bedtime.    . montelukast (SINGULAIR) 10 MG tablet Take 10 mg by mouth daily.  6  . Multiple Vitamins-Minerals (MULTIVITAMIN PO) Take 1 tablet by mouth daily.     Marland Kitchen omeprazole (PRILOSEC) 40 MG capsule Take 1 capsule (40 mg total) by mouth in the morning and at bedtime. 90 capsule 3  . propranolol ER (INDERAL LA) 120 MG 24 hr capsule Take 120 mg by mouth daily.  3  . urea (CARMOL) 40 % CREA Apply 1 application topically daily as needed for rash.    Grant Ruts INHUB 100-50 MCG/DOSE AEPB Inhale 1 puff into the lungs 2 (two) times daily.     No current facility-administered medications for this visit.    REVIEW OF SYSTEMS:  [X]  denotes positive finding, [ ]  denotes negative finding Cardiac  Comments:  Chest pain or chest pressure:    Shortness of breath upon exertion:    Short of breath when lying flat:    Irregular heart rhythm:        Vascular    Pain in calf, thigh, or hip brought on by ambulation:    Pain in feet at night that wakes you up from your sleep:     Blood clot in your veins:    Leg swelling:         Pulmonary    Oxygen at home:    Productive cough:     Wheezing:         Neurologic    Sudden weakness in arms or legs:     Sudden numbness in arms or legs:     Sudden onset of difficulty speaking or slurred speech:    Temporary loss of vision in one eye:     Problems with dizziness:         Gastrointestinal    Blood in stool:     Vomited blood:         Genitourinary    Burning when urinating:     Blood in  urine:        Psychiatric    Major depression:         Hematologic    Bleeding problems:    Problems with blood clotting too easily:        Skin    Rashes or ulcers:        Constitutional  Fever or chills:      PHYSICAL EXAM: Vitals:   06/18/20 0821  BP: 135/66  Pulse: 65  Resp: 14  Temp: (!) 94.6 F (34.8 C)  TempSrc: Temporal  SpO2: 96%  Weight: 81 lb 6.4 oz (36.9 kg)  Height: 5\' 2"  (1.575 m)    GENERAL: The patient is a well-nourished female, in no acute distress. The vital signs are documented above. CARDIAC: There is a regular rate and rhythm.  VASCULAR:  Right femoral pulse 2+ palpable Does have fullness in the right groin consistent with lymphocele or hematoma Right posterior tibial brisk by Doppler as well as dorsalis pedis signal PULMONARY: There is good air exchange bilaterally without wheezing or rales. ABDOMEN: Soft and non-tender with normal pitched bowel sounds.  MUSCULOSKELETAL: There are no major deformities or cyanosis.   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  Her SMA was widely patent after evaluation with mesenteric arteriogram and SMA intervention was not required.  Her transfemoral access was complicated by closure complication in the right groin after mynx deployment requiring right femoral thromboembolectomy and bovine patch on the same day as her celiac stent on 05/15/2020.    Overall I think she is doing very well from an abdominal standpoint and her celiac stent looks widely patent and her SMA was widely patent on mesenteric arteriogram and did not require intervention.  Very pleased with her clinical  progress.  She does have some fullness in the right groin we performed duplex of this today just to ensure there is no pseudoaneurysm and the duplex looks okay.  This is consistent with either small hematoma versus lymphocele.  She is not having any drainage from the groin with complete healing of the incision and no significant pain.  I have suggested just conservative management with observation.  We will have her follow-up in 1 month for another groin check.   Marty Heck, MD [facility] Office: [phone]

## 2020-07-03 DIAGNOSIS — I774 Celiac artery compression syndrome: Secondary | ICD-10-CM | POA: Diagnosis not present

## 2020-07-03 DIAGNOSIS — E785 Hyperlipidemia, unspecified: Secondary | ICD-10-CM | POA: Diagnosis not present

## 2020-07-03 DIAGNOSIS — M810 Age-related osteoporosis without current pathological fracture: Secondary | ICD-10-CM | POA: Diagnosis not present

## 2020-07-03 DIAGNOSIS — B0229 Other postherpetic nervous system involvement: Secondary | ICD-10-CM | POA: Diagnosis not present

## 2020-07-03 DIAGNOSIS — M5416 Radiculopathy, lumbar region: Secondary | ICD-10-CM | POA: Diagnosis not present

## 2020-07-03 DIAGNOSIS — I700 Atherosclerosis of aorta: Secondary | ICD-10-CM | POA: Diagnosis not present

## 2020-07-03 DIAGNOSIS — I10 Essential (primary) hypertension: Secondary | ICD-10-CM | POA: Diagnosis not present

## 2020-07-03 DIAGNOSIS — E871 Hypo-osmolality and hyponatremia: Secondary | ICD-10-CM | POA: Diagnosis not present

## 2020-07-03 DIAGNOSIS — N1831 Chronic kidney disease, stage 3a: Secondary | ICD-10-CM | POA: Diagnosis not present

## 2020-07-03 DIAGNOSIS — F329 Major depressive disorder, single episode, unspecified: Secondary | ICD-10-CM | POA: Diagnosis not present

## 2020-07-03 DIAGNOSIS — J432 Centrilobular emphysema: Secondary | ICD-10-CM | POA: Diagnosis not present

## 2020-07-03 DIAGNOSIS — R7301 Impaired fasting glucose: Secondary | ICD-10-CM | POA: Diagnosis not present

## 2020-07-11 DIAGNOSIS — R072 Precordial pain: Secondary | ICD-10-CM | POA: Diagnosis not present

## 2020-07-11 DIAGNOSIS — M25531 Pain in right wrist: Secondary | ICD-10-CM | POA: Diagnosis not present

## 2020-07-14 ENCOUNTER — Encounter (HOSPITAL_COMMUNITY): Payer: Self-pay | Admitting: Emergency Medicine

## 2020-07-14 ENCOUNTER — Emergency Department (HOSPITAL_COMMUNITY): Payer: Medicare Other

## 2020-07-14 ENCOUNTER — Inpatient Hospital Stay (HOSPITAL_COMMUNITY)
Admission: EM | Admit: 2020-07-14 | Discharge: 2020-07-18 | DRG: 190 | Disposition: A | Discharge status: Home Health | Payer: Medicare Other | Attending: Internal Medicine | Admitting: Internal Medicine

## 2020-07-14 ENCOUNTER — Other Ambulatory Visit: Payer: Self-pay

## 2020-07-14 DIAGNOSIS — J80 Acute respiratory distress syndrome: Secondary | ICD-10-CM | POA: Diagnosis not present

## 2020-07-14 DIAGNOSIS — S2220XA Unspecified fracture of sternum, initial encounter for closed fracture: Secondary | ICD-10-CM | POA: Diagnosis not present

## 2020-07-14 DIAGNOSIS — Z83438 Family history of other disorder of lipoprotein metabolism and other lipidemia: Secondary | ICD-10-CM

## 2020-07-14 DIAGNOSIS — Z79899 Other long term (current) drug therapy: Secondary | ICD-10-CM

## 2020-07-14 DIAGNOSIS — S22029A Unspecified fracture of second thoracic vertebra, initial encounter for closed fracture: Secondary | ICD-10-CM | POA: Diagnosis present

## 2020-07-14 DIAGNOSIS — J9601 Acute respiratory failure with hypoxia: Secondary | ICD-10-CM | POA: Diagnosis present

## 2020-07-14 DIAGNOSIS — M545 Low back pain: Secondary | ICD-10-CM | POA: Diagnosis present

## 2020-07-14 DIAGNOSIS — J441 Chronic obstructive pulmonary disease with (acute) exacerbation: Secondary | ICD-10-CM | POA: Diagnosis not present

## 2020-07-14 DIAGNOSIS — E785 Hyperlipidemia, unspecified: Secondary | ICD-10-CM | POA: Diagnosis present

## 2020-07-14 DIAGNOSIS — S22009A Unspecified fracture of unspecified thoracic vertebra, initial encounter for closed fracture: Secondary | ICD-10-CM

## 2020-07-14 DIAGNOSIS — S2222XA Fracture of body of sternum, initial encounter for closed fracture: Secondary | ICD-10-CM

## 2020-07-14 DIAGNOSIS — R52 Pain, unspecified: Secondary | ICD-10-CM | POA: Diagnosis not present

## 2020-07-14 DIAGNOSIS — M19041 Primary osteoarthritis, right hand: Secondary | ICD-10-CM | POA: Diagnosis present

## 2020-07-14 DIAGNOSIS — R5381 Other malaise: Secondary | ICD-10-CM | POA: Diagnosis not present

## 2020-07-14 DIAGNOSIS — Z20822 Contact with and (suspected) exposure to covid-19: Secondary | ICD-10-CM | POA: Diagnosis present

## 2020-07-14 DIAGNOSIS — Z8249 Family history of ischemic heart disease and other diseases of the circulatory system: Secondary | ICD-10-CM

## 2020-07-14 DIAGNOSIS — F1120 Opioid dependence, uncomplicated: Secondary | ICD-10-CM | POA: Diagnosis present

## 2020-07-14 DIAGNOSIS — I2510 Atherosclerotic heart disease of native coronary artery without angina pectoris: Secondary | ICD-10-CM | POA: Diagnosis not present

## 2020-07-14 DIAGNOSIS — G8929 Other chronic pain: Secondary | ICD-10-CM | POA: Diagnosis present

## 2020-07-14 DIAGNOSIS — E781 Pure hyperglyceridemia: Secondary | ICD-10-CM | POA: Diagnosis present

## 2020-07-14 DIAGNOSIS — I700 Atherosclerosis of aorta: Secondary | ICD-10-CM | POA: Diagnosis not present

## 2020-07-14 DIAGNOSIS — D649 Anemia, unspecified: Secondary | ICD-10-CM | POA: Diagnosis present

## 2020-07-14 DIAGNOSIS — K219 Gastro-esophageal reflux disease without esophagitis: Secondary | ICD-10-CM | POA: Diagnosis present

## 2020-07-14 DIAGNOSIS — F419 Anxiety disorder, unspecified: Secondary | ICD-10-CM | POA: Diagnosis present

## 2020-07-14 DIAGNOSIS — Y92410 Unspecified street and highway as the place of occurrence of the external cause: Secondary | ICD-10-CM

## 2020-07-14 DIAGNOSIS — M19042 Primary osteoarthritis, left hand: Secondary | ICD-10-CM | POA: Diagnosis present

## 2020-07-14 DIAGNOSIS — Z681 Body mass index (BMI) 19 or less, adult: Secondary | ICD-10-CM

## 2020-07-14 DIAGNOSIS — I739 Peripheral vascular disease, unspecified: Secondary | ICD-10-CM | POA: Diagnosis present

## 2020-07-14 DIAGNOSIS — I10 Essential (primary) hypertension: Secondary | ICD-10-CM | POA: Diagnosis not present

## 2020-07-14 DIAGNOSIS — S22019A Unspecified fracture of first thoracic vertebra, initial encounter for closed fracture: Secondary | ICD-10-CM | POA: Diagnosis present

## 2020-07-14 DIAGNOSIS — R739 Hyperglycemia, unspecified: Secondary | ICD-10-CM | POA: Diagnosis present

## 2020-07-14 DIAGNOSIS — Z7902 Long term (current) use of antithrombotics/antiplatelets: Secondary | ICD-10-CM

## 2020-07-14 DIAGNOSIS — E43 Unspecified severe protein-calorie malnutrition: Secondary | ICD-10-CM | POA: Diagnosis present

## 2020-07-14 DIAGNOSIS — Z79891 Long term (current) use of opiate analgesic: Secondary | ICD-10-CM

## 2020-07-14 DIAGNOSIS — K551 Chronic vascular disorders of intestine: Secondary | ICD-10-CM | POA: Diagnosis present

## 2020-07-14 DIAGNOSIS — R079 Chest pain, unspecified: Secondary | ICD-10-CM | POA: Diagnosis not present

## 2020-07-14 DIAGNOSIS — J439 Emphysema, unspecified: Secondary | ICD-10-CM | POA: Diagnosis not present

## 2020-07-14 DIAGNOSIS — R748 Abnormal levels of other serum enzymes: Secondary | ICD-10-CM | POA: Diagnosis present

## 2020-07-14 DIAGNOSIS — R0602 Shortness of breath: Secondary | ICD-10-CM | POA: Diagnosis not present

## 2020-07-14 DIAGNOSIS — Z87891 Personal history of nicotine dependence: Secondary | ICD-10-CM

## 2020-07-14 LAB — CBC
HCT: 39.3 % (ref 36.0–46.0)
Hemoglobin: 11 g/dL — ABNORMAL LOW (ref 12.0–15.0)
MCH: 24.5 pg — ABNORMAL LOW (ref 26.0–34.0)
MCHC: 28 g/dL — ABNORMAL LOW (ref 30.0–36.0)
MCV: 87.5 fL (ref 80.0–100.0)
Platelets: 336 10*3/uL (ref 150–400)
RBC: 4.49 MIL/uL (ref 3.87–5.11)
RDW: 14.7 % (ref 11.5–15.5)
WBC: 10.1 10*3/uL (ref 4.0–10.5)
nRBC: 0 % (ref 0.0–0.2)
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
## 2020-07-15 ENCOUNTER — Emergency Department (HOSPITAL_COMMUNITY): Payer: Medicare Other

## 2020-07-15 DIAGNOSIS — Z79899 Other long term (current) drug therapy: Secondary | ICD-10-CM | POA: Diagnosis not present

## 2020-07-15 DIAGNOSIS — J441 Chronic obstructive pulmonary disease with (acute) exacerbation: Secondary | ICD-10-CM | POA: Diagnosis present

## 2020-07-15 DIAGNOSIS — M19041 Primary osteoarthritis, right hand: Secondary | ICD-10-CM | POA: Diagnosis present

## 2020-07-15 DIAGNOSIS — G8929 Other chronic pain: Secondary | ICD-10-CM | POA: Diagnosis present

## 2020-07-15 DIAGNOSIS — S22019A Unspecified fracture of first thoracic vertebra, initial encounter for closed fracture: Secondary | ICD-10-CM | POA: Diagnosis present

## 2020-07-15 DIAGNOSIS — F1120 Opioid dependence, uncomplicated: Secondary | ICD-10-CM | POA: Diagnosis present

## 2020-07-15 DIAGNOSIS — I10 Essential (primary) hypertension: Secondary | ICD-10-CM | POA: Diagnosis not present

## 2020-07-15 DIAGNOSIS — D649 Anemia, unspecified: Secondary | ICD-10-CM | POA: Diagnosis not present

## 2020-07-15 DIAGNOSIS — Y92410 Unspecified street and highway as the place of occurrence of the external cause: Secondary | ICD-10-CM | POA: Diagnosis not present

## 2020-07-15 DIAGNOSIS — K219 Gastro-esophageal reflux disease without esophagitis: Secondary | ICD-10-CM | POA: Diagnosis not present

## 2020-07-15 DIAGNOSIS — Z79891 Long term (current) use of opiate analgesic: Secondary | ICD-10-CM

## 2020-07-15 DIAGNOSIS — R739 Hyperglycemia, unspecified: Secondary | ICD-10-CM | POA: Diagnosis present

## 2020-07-15 DIAGNOSIS — M19042 Primary osteoarthritis, left hand: Secondary | ICD-10-CM | POA: Diagnosis present

## 2020-07-15 DIAGNOSIS — J9601 Acute respiratory failure with hypoxia: Secondary | ICD-10-CM | POA: Diagnosis present

## 2020-07-15 DIAGNOSIS — R748 Abnormal levels of other serum enzymes: Secondary | ICD-10-CM | POA: Diagnosis not present

## 2020-07-15 DIAGNOSIS — Z20822 Contact with and (suspected) exposure to covid-19: Secondary | ICD-10-CM | POA: Diagnosis present

## 2020-07-15 DIAGNOSIS — S2222XA Fracture of body of sternum, initial encounter for closed fracture: Secondary | ICD-10-CM | POA: Diagnosis not present

## 2020-07-15 DIAGNOSIS — S22029A Unspecified fracture of second thoracic vertebra, initial encounter for closed fracture: Secondary | ICD-10-CM | POA: Diagnosis present

## 2020-07-15 DIAGNOSIS — S2220XA Unspecified fracture of sternum, initial encounter for closed fracture: Secondary | ICD-10-CM | POA: Diagnosis present

## 2020-07-15 DIAGNOSIS — I739 Peripheral vascular disease, unspecified: Secondary | ICD-10-CM | POA: Diagnosis not present

## 2020-07-15 DIAGNOSIS — E785 Hyperlipidemia, unspecified: Secondary | ICD-10-CM | POA: Diagnosis not present

## 2020-07-15 DIAGNOSIS — E43 Unspecified severe protein-calorie malnutrition: Secondary | ICD-10-CM | POA: Diagnosis present

## 2020-07-15 DIAGNOSIS — F419 Anxiety disorder, unspecified: Secondary | ICD-10-CM | POA: Diagnosis present

## 2020-07-15 DIAGNOSIS — S22009A Unspecified fracture of unspecified thoracic vertebra, initial encounter for closed fracture: Secondary | ICD-10-CM | POA: Diagnosis present

## 2020-07-15 DIAGNOSIS — J439 Emphysema, unspecified: Secondary | ICD-10-CM | POA: Diagnosis not present

## 2020-07-15 DIAGNOSIS — Z7902 Long term (current) use of antithrombotics/antiplatelets: Secondary | ICD-10-CM | POA: Diagnosis not present

## 2020-07-15 DIAGNOSIS — E781 Pure hyperglyceridemia: Secondary | ICD-10-CM | POA: Diagnosis present

## 2020-07-15 DIAGNOSIS — I2510 Atherosclerotic heart disease of native coronary artery without angina pectoris: Secondary | ICD-10-CM | POA: Diagnosis not present

## 2020-07-15 DIAGNOSIS — K551 Chronic vascular disorders of intestine: Secondary | ICD-10-CM | POA: Diagnosis present

## 2020-07-15 DIAGNOSIS — I700 Atherosclerosis of aorta: Secondary | ICD-10-CM | POA: Diagnosis not present

## 2020-07-15 DIAGNOSIS — M545 Low back pain: Secondary | ICD-10-CM | POA: Diagnosis present

## 2020-07-15 DIAGNOSIS — Z681 Body mass index (BMI) 19 or less, adult: Secondary | ICD-10-CM | POA: Diagnosis not present

## 2020-07-15 LAB — BASIC METABOLIC PANEL
Anion gap: 12 (ref 5–15)
BUN: 23 mg/dL (ref 8–23)
CO2: 25 mmol/L (ref 22–32)
Calcium: 9.6 mg/dL (ref 8.9–10.3)
Chloride: 102 mmol/L (ref 98–111)
Creatinine, Ser: 0.69 mg/dL (ref 0.44–1.00)
GFR calc Af Amer: >60 mL/min (ref >60)
GFR calc non Af Amer: >60 mL/min (ref >60)
Glucose, Bld: 134 mg/dL — ABNORMAL HIGH (ref 70–99)
Potassium: 4.1 mmol/L (ref 3.5–5.1)
Sodium: 139 mmol/L (ref 135–145)

## 2020-07-15 LAB — TROPONIN I (HIGH SENSITIVITY)
Troponin I (High Sensitivity): 7 ng/L (ref <18)
Troponin I (High Sensitivity): 9 ng/L (ref <18)

## 2020-07-15 LAB — HEPATIC FUNCTION PANEL
ALT: 35 U/L (ref 0–44)
AST: 37 U/L (ref 15–41)
Albumin: 3.1 g/dL — ABNORMAL LOW (ref 3.5–5.0)
Alkaline Phosphatase: 179 U/L — ABNORMAL HIGH (ref 38–126)
Bilirubin, Direct: <0.1 mg/dL (ref 0.0–0.2)
Total Bilirubin: 0.6 mg/dL (ref 0.3–1.2)
Total Protein: 6.5 g/dL (ref 6.5–8.1)

## 2020-07-15 LAB — SARS CORONAVIRUS 2 BY RT PCR (HOSPITAL ORDER, PERFORMED IN ~~LOC~~ HOSPITAL LAB): SARS Coronavirus 2: NEGATIVE

## 2020-07-15 LAB — LIPASE, BLOOD: Lipase: 44 U/L (ref 11–51)

## 2020-07-15 LAB — HIV ANTIBODY (ROUTINE TESTING W REFLEX): HIV Screen 4th Generation wRfx: NONREACTIVE

## 2020-07-15 MED ORDER — IPRATROPIUM-ALBUTEROL 0.5-2.5 (3) MG/3ML IN SOLN
3.0000 mL | Freq: Once | RESPIRATORY_TRACT | Status: AC
  Administered 2020-07-15: 3 mL via RESPIRATORY_TRACT
  Filled 2020-07-15: qty 3

## 2020-07-15 MED ORDER — IPRATROPIUM-ALBUTEROL 0.5-2.5 (3) MG/3ML IN SOLN
3.0000 mL | Freq: Four times a day (QID) | RESPIRATORY_TRACT | Status: DC | PRN
  Administered 2020-07-16: 3 mL via RESPIRATORY_TRACT
  Filled 2020-07-15: qty 3

## 2020-07-15 MED ORDER — METHYLPREDNISOLONE SODIUM SUCC 125 MG IJ SOLR
60.0000 mg | Freq: Three times a day (TID) | INTRAMUSCULAR | Status: DC
  Administered 2020-07-15 – 2020-07-17 (×7): 60 mg via INTRAVENOUS
  Filled 2020-07-15 (×7): qty 2

## 2020-07-15 MED ORDER — AMLODIPINE BESYLATE 5 MG PO TABS
5.0000 mg | ORAL_TABLET | Freq: Every day | ORAL | Status: DC
  Administered 2020-07-15 – 2020-07-18 (×4): 5 mg via ORAL
  Filled 2020-07-15 (×4): qty 1

## 2020-07-15 MED ORDER — SODIUM CHLORIDE 0.9% FLUSH
3.0000 mL | Freq: Two times a day (BID) | INTRAVENOUS | Status: DC
  Administered 2020-07-15 – 2020-07-17 (×6): 3 mL via INTRAVENOUS

## 2020-07-15 MED ORDER — ONDANSETRON HCL 4 MG/2ML IJ SOLN
4.0000 mg | Freq: Once | INTRAMUSCULAR | Status: DC
  Filled 2020-07-15: qty 2

## 2020-07-15 MED ORDER — ACETAMINOPHEN 325 MG PO TABS: 650.0000 mg | ORAL_TABLET | Freq: Four times a day (QID) | ORAL | Status: DC | PRN

## 2020-07-15 MED ORDER — SODIUM CHLORIDE 0.9 % IV SOLN
500.0000 mg | Freq: Every day | INTRAVENOUS | Status: AC
  Administered 2020-07-15: 500 mg via INTRAVENOUS
  Filled 2020-07-15: qty 500

## 2020-07-15 MED ORDER — UREA 10 % EX LOTN: 1.0000 "application " | TOPICAL_LOTION | Freq: Every day | CUTANEOUS | Status: DC | PRN

## 2020-07-15 MED ORDER — PANTOPRAZOLE SODIUM 40 MG PO TBEC
40.0000 mg | DELAYED_RELEASE_TABLET | Freq: Two times a day (BID) | ORAL | Status: DC
  Administered 2020-07-15 – 2020-07-18 (×7): 40 mg via ORAL
  Filled 2020-07-15 (×7): qty 1

## 2020-07-15 MED ORDER — MORPHINE SULFATE (PF) 4 MG/ML IV SOLN
4.0000 mg | Freq: Once | INTRAVENOUS | Status: DC
  Filled 2020-07-15: qty 1

## 2020-07-15 MED ORDER — IOHEXOL 300 MG/ML  SOLN
75.0000 mL | Freq: Once | INTRAMUSCULAR | Status: AC | PRN
  Administered 2020-07-15: 75 mL via INTRAVENOUS

## 2020-07-15 MED ORDER — ATORVASTATIN CALCIUM 10 MG PO TABS
20.0000 mg | ORAL_TABLET | Freq: Every day | ORAL | Status: DC
  Administered 2020-07-15 – 2020-07-18 (×4): 20 mg via ORAL
  Filled 2020-07-15 (×4): qty 2

## 2020-07-15 MED ORDER — CLOPIDOGREL BISULFATE 75 MG PO TABS
75.0000 mg | ORAL_TABLET | Freq: Every day | ORAL | Status: DC
  Administered 2020-07-15 – 2020-07-18 (×4): 75 mg via ORAL
  Filled 2020-07-15 (×4): qty 1

## 2020-07-15 MED ORDER — METHYLPREDNISOLONE SODIUM SUCC 125 MG IJ SOLR
125.0000 mg | Freq: Once | INTRAMUSCULAR | Status: AC
  Administered 2020-07-15: 125 mg via INTRAVENOUS
  Filled 2020-07-15: qty 2

## 2020-07-15 MED ORDER — GUAIFENESIN ER 600 MG PO TB12
600.0000 mg | ORAL_TABLET | Freq: Two times a day (BID) | ORAL | Status: DC
  Administered 2020-07-15 – 2020-07-18 (×7): 600 mg via ORAL
  Filled 2020-07-15 (×7): qty 1

## 2020-07-15 MED ORDER — GABAPENTIN 100 MG PO CAPS
100.0000 mg | ORAL_CAPSULE | Freq: Two times a day (BID) | ORAL | Status: DC | PRN
  Filled 2020-07-15: qty 1

## 2020-07-15 MED ORDER — MORPHINE SULFATE (PF) 4 MG/ML IV SOLN
4.0000 mg | Freq: Once | INTRAVENOUS | Status: AC
  Administered 2020-07-15: 4 mg via INTRAVENOUS
  Filled 2020-07-15: qty 1

## 2020-07-15 MED ORDER — FENOFIBRATE 160 MG PO TABS
160.0000 mg | ORAL_TABLET | Freq: Every day | ORAL | Status: DC
  Administered 2020-07-15 – 2020-07-18 (×3): 160 mg via ORAL
  Filled 2020-07-15 (×4): qty 1

## 2020-07-15 MED ORDER — IPRATROPIUM BROMIDE HFA 17 MCG/ACT IN AERS
2.0000 | INHALATION_SPRAY | Freq: Once | RESPIRATORY_TRACT | Status: AC
  Administered 2020-07-15: 2 via RESPIRATORY_TRACT
  Filled 2020-07-15: qty 12.9

## 2020-07-15 MED ORDER — IPRATROPIUM-ALBUTEROL 0.5-2.5 (3) MG/3ML IN SOLN: 3.0000 mL | Freq: Four times a day (QID) | RESPIRATORY_TRACT | Status: DC

## 2020-07-15 MED ORDER — ALBUTEROL SULFATE HFA 108 (90 BASE) MCG/ACT IN AERS
6.0000 | INHALATION_SPRAY | Freq: Once | RESPIRATORY_TRACT | Status: AC
  Administered 2020-07-15: 6 via RESPIRATORY_TRACT
  Filled 2020-07-15: qty 6.7

## 2020-07-15 MED ORDER — OXYCODONE-ACETAMINOPHEN 7.5-325 MG PO TABS
1.0000 | ORAL_TABLET | Freq: Once | ORAL | Status: AC
  Administered 2020-07-15: 1 via ORAL
  Filled 2020-07-15: qty 1

## 2020-07-15 MED ORDER — ONDANSETRON HCL 4 MG/2ML IJ SOLN: 4.0000 mg | Freq: Four times a day (QID) | INTRAMUSCULAR | Status: DC | PRN

## 2020-07-15 MED ORDER — LOSARTAN POTASSIUM 50 MG PO TABS
100.0000 mg | ORAL_TABLET | Freq: Every day | ORAL | Status: DC
  Administered 2020-07-15 – 2020-07-18 (×4): 100 mg via ORAL
  Filled 2020-07-15 (×4): qty 2

## 2020-07-15 MED ORDER — MONTELUKAST SODIUM 10 MG PO TABS
10.0000 mg | ORAL_TABLET | Freq: Every day | ORAL | Status: DC
  Administered 2020-07-16 – 2020-07-18 (×3): 10 mg via ORAL
  Filled 2020-07-15 (×5): qty 1

## 2020-07-15 MED ORDER — ACETAMINOPHEN 650 MG RE SUPP: 650.0000 mg | Freq: Four times a day (QID) | RECTAL | Status: DC | PRN

## 2020-07-15 MED ORDER — LIDOCAINE 5 % EX PTCH
1.0000 | MEDICATED_PATCH | CUTANEOUS | Status: DC
  Administered 2020-07-15 – 2020-07-18 (×4): 1 via TRANSDERMAL
  Filled 2020-07-15 (×4): qty 1

## 2020-07-15 MED ORDER — OXYCODONE-ACETAMINOPHEN 7.5-325 MG PO TABS
1.0000 | ORAL_TABLET | Freq: Four times a day (QID) | ORAL | Status: DC | PRN
  Administered 2020-07-15 (×2): 1 via ORAL
  Filled 2020-07-15 (×3): qty 1

## 2020-07-15 MED ORDER — FENTANYL CITRATE (PF) 100 MCG/2ML IJ SOLN
50.0000 ug | Freq: Once | INTRAMUSCULAR | Status: AC
  Administered 2020-07-15: 50 ug via INTRAVENOUS
  Filled 2020-07-15: qty 2

## 2020-07-15 MED ORDER — MIRTAZAPINE 15 MG PO TABS
15.0000 mg | ORAL_TABLET | Freq: Every day | ORAL | Status: DC
  Administered 2020-07-15 – 2020-07-17 (×3): 15 mg via ORAL
  Filled 2020-07-15 (×4): qty 1

## 2020-07-15 MED ORDER — MOMETASONE FURO-FORMOTEROL FUM 100-5 MCG/ACT IN AERO
2.0000 | INHALATION_SPRAY | Freq: Two times a day (BID) | RESPIRATORY_TRACT | Status: DC
  Administered 2020-07-15 – 2020-07-18 (×7): 2 via RESPIRATORY_TRACT
  Filled 2020-07-15: qty 8.8

## 2020-07-15 MED ORDER — HYDROCODONE-ACETAMINOPHEN 10-325 MG PO TABS: 1.0000 | ORAL_TABLET | Freq: Four times a day (QID) | ORAL | Status: DC | PRN

## 2020-07-15 MED ORDER — FENTANYL CITRATE (PF) 100 MCG/2ML IJ SOLN
25.0000 ug | INTRAMUSCULAR | Status: DC | PRN
  Administered 2020-07-15: 25 ug via INTRAVENOUS
  Filled 2020-07-15: qty 2

## 2020-07-15 MED ORDER — ENOXAPARIN SODIUM 30 MG/0.3ML ~~LOC~~ SOLN
30.0000 mg | Freq: Every day | SUBCUTANEOUS | Status: DC
  Administered 2020-07-15 – 2020-07-18 (×4): 30 mg via SUBCUTANEOUS
  Filled 2020-07-15 (×4): qty 0.3

## 2020-07-15 MED ORDER — ONDANSETRON HCL 4 MG PO TABS: 4.0000 mg | ORAL_TABLET | Freq: Four times a day (QID) | ORAL | Status: DC | PRN

## 2020-07-15 MED ORDER — AZITHROMYCIN 500 MG PO TABS
500.0000 mg | ORAL_TABLET | Freq: Every day | ORAL | Status: DC
  Administered 2020-07-16 – 2020-07-18 (×3): 500 mg via ORAL
  Filled 2020-07-15 (×3): qty 1

## 2020-07-15 MED ORDER — ONDANSETRON HCL 4 MG/2ML IJ SOLN
4.0000 mg | Freq: Once | INTRAMUSCULAR | Status: AC
  Administered 2020-07-15: 4 mg via INTRAVENOUS
  Filled 2020-07-15: qty 2

## 2020-07-15 MED ORDER — SODIUM CHLORIDE 0.9% FLUSH
3.0000 mL | Freq: Two times a day (BID) | INTRAVENOUS | Status: DC
  Administered 2020-07-15 – 2020-07-16 (×2): 3 mL via INTRAVENOUS

## 2020-07-15 MED ORDER — PROPRANOLOL HCL ER 60 MG PO CP24
120.0000 mg | ORAL_CAPSULE | Freq: Every day | ORAL | Status: DC
  Administered 2020-07-15 – 2020-07-18 (×4): 120 mg via ORAL
  Filled 2020-07-15: qty 2
  Filled 2020-07-15: qty 1
  Filled 2020-07-15 (×3): qty 2

## 2020-07-15 MED ORDER — AEROCHAMBER PLUS FLO-VU LARGE MISC: 1.0000 | Freq: Once | Status: DC
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
## 2020-07-15 NOTE — ED Notes (Signed)
Patient walked to bathroom, gait steady, SpO2 93-96% on RA, patient c/o pain

## 2020-07-15 NOTE — Progress Notes (Signed)
Awaiting orthopedic guidance for her fractures, esp if bracing is needed.   07/15/20 1200  PT Visit Information  Last PT Received On 07/15/20  Reason Eval/Treat Not Completed Medical issues which prohibited therapy    Mee Hives, PT MS Acute Rehab Dept. Number: [place] and [place]
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
## 2020-07-15 NOTE — Progress Notes (Signed)
Pt arrived to unit, a&ox4, ambulated to the bed, VSS. Will continue to monitor.

## 2020-07-16 ENCOUNTER — Encounter (HOSPITAL_COMMUNITY): Payer: Self-pay | Admitting: Internal Medicine

## 2020-07-16 DIAGNOSIS — J441 Chronic obstructive pulmonary disease with (acute) exacerbation: Secondary | ICD-10-CM | POA: Diagnosis not present

## 2020-07-16 LAB — GLUCOSE, CAPILLARY
Glucose-Capillary: 158 mg/dL — ABNORMAL HIGH (ref 70–99)
Glucose-Capillary: 230 mg/dL — ABNORMAL HIGH (ref 70–99)

## 2020-07-16 LAB — CBC
HCT: 33.4 % — ABNORMAL LOW (ref 36.0–46.0)
Hemoglobin: 9.7 g/dL — ABNORMAL LOW (ref 12.0–15.0)
MCH: 24.6 pg — ABNORMAL LOW (ref 26.0–34.0)
MCHC: 29 g/dL — ABNORMAL LOW (ref 30.0–36.0)
MCV: 84.6 fL (ref 80.0–100.0)
Platelets: 296 10*3/uL (ref 150–400)
RBC: 3.95 MIL/uL (ref 3.87–5.11)
RDW: 14.6 % (ref 11.5–15.5)
WBC: 6.4 10*3/uL (ref 4.0–10.5)
nRBC: 0 % (ref 0.0–0.2)

## 2020-07-16 LAB — BASIC METABOLIC PANEL
Anion gap: 9 (ref 5–15)
BUN: 29 mg/dL — ABNORMAL HIGH (ref 8–23)
CO2: 25 mmol/L (ref 22–32)
Calcium: 9.1 mg/dL (ref 8.9–10.3)
Chloride: 101 mmol/L (ref 98–111)
Creatinine, Ser: 0.79 mg/dL (ref 0.44–1.00)
GFR calc Af Amer: >60 mL/min (ref >60)
GFR calc non Af Amer: >60 mL/min (ref >60)
Glucose, Bld: 206 mg/dL — ABNORMAL HIGH (ref 70–99)
Potassium: 4.6 mmol/L (ref 3.5–5.1)
Sodium: 135 mmol/L (ref 135–145)

## 2020-07-16 MED ORDER — SENNOSIDES-DOCUSATE SODIUM 8.6-50 MG PO TABS
1.0000 | ORAL_TABLET | Freq: Every evening | ORAL | Status: DC | PRN
  Administered 2020-07-16: 1 via ORAL
  Filled 2020-07-16: qty 1

## 2020-07-16 MED ORDER — INSULIN ASPART 100 UNIT/ML ~~LOC~~ SOLN
0.0000 [IU] | Freq: Three times a day (TID) | SUBCUTANEOUS | Status: DC
  Administered 2020-07-16: 3 [IU] via SUBCUTANEOUS
  Administered 2020-07-16: 2 [IU] via SUBCUTANEOUS
  Administered 2020-07-17: 3 [IU] via SUBCUTANEOUS
  Administered 2020-07-17: 5 [IU] via SUBCUTANEOUS

## 2020-07-16 MED ORDER — OXYCODONE HCL 5 MG PO TABS
5.0000 mg | ORAL_TABLET | ORAL | Status: DC | PRN
  Administered 2020-07-16 (×3): 10 mg via ORAL
  Administered 2020-07-17: 5 mg via ORAL
  Administered 2020-07-17: 10 mg via ORAL
  Administered 2020-07-18: 5 mg via ORAL
  Filled 2020-07-16 (×3): qty 2
  Filled 2020-07-16 (×2): qty 1
  Filled 2020-07-16: qty 2

## 2020-07-16 MED ORDER — LACTULOSE 10 GM/15ML PO SOLN
10.0000 g | Freq: Two times a day (BID) | ORAL | Status: DC | PRN
  Administered 2020-07-16: 10 g via ORAL
  Filled 2020-07-16: qty 15

## 2020-07-16 MED ORDER — FENTANYL CITRATE (PF) 100 MCG/2ML IJ SOLN
25.0000 ug | INTRAMUSCULAR | Status: DC | PRN
  Administered 2020-07-17: 25 ug via INTRAVENOUS
  Filled 2020-07-16: qty 2

## 2020-07-16 MED ORDER — BISACODYL 10 MG RE SUPP: 10.0000 mg | Freq: Every day | RECTAL | Status: DC | PRN

## 2020-07-16 MED ORDER — POLYETHYLENE GLYCOL 3350 17 G PO PACK
17.0000 g | PACK | Freq: Two times a day (BID) | ORAL | Status: DC
  Administered 2020-07-16 – 2020-07-17 (×3): 17 g via ORAL
  Filled 2020-07-16 (×5): qty 1

## 2020-07-16 MED ORDER — ACETAMINOPHEN 500 MG PO TABS
1000.0000 mg | ORAL_TABLET | Freq: Three times a day (TID) | ORAL | Status: DC
  Administered 2020-07-16 – 2020-07-18 (×6): 1000 mg via ORAL
  Filled 2020-07-16 (×7): qty 2
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
## 2020-07-16 NOTE — Plan of Care (Signed)
  Problem: Education: Goal: Knowledge of General Education information will improve Description Including pain rating scale, medication(s)/side effects and non-pharmacologic comfort measures Outcome: Progressing   Problem: Education: Goal: Knowledge of General Education information will improve Description Including pain rating scale, medication(s)/side effects and non-pharmacologic comfort measures Outcome: Progressing   

## 2020-07-16 NOTE — Progress Notes (Signed)
PT Cancellation Note  Patient Details Name: Jessica [name] MRN: [id number] DOB: Nov 21, 1932   Cancelled Treatment:    Reason Eval/Treat Not Completed: (P) Other (comment) (Pt on the phone trying to handle some of her finances and asked PTA to return after lunch.  Will f/u per POC.)   Jayleah Garbers Eli Hose 07/16/2020, 12:13 PM  Erasmo Leventhal , PTA Acute Rehabilitation Services Pager [phone] Office [phone]
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
## 2020-07-16 NOTE — Plan of Care (Signed)
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
## 2020-07-17 DIAGNOSIS — E43 Unspecified severe protein-calorie malnutrition: Secondary | ICD-10-CM | POA: Diagnosis present

## 2020-07-17 DIAGNOSIS — J441 Chronic obstructive pulmonary disease with (acute) exacerbation: Secondary | ICD-10-CM | POA: Diagnosis not present

## 2020-07-17 LAB — GLUCOSE, CAPILLARY
Glucose-Capillary: 258 mg/dL — ABNORMAL HIGH (ref 70–99)
Glucose-Capillary: 108 mg/dL — ABNORMAL HIGH (ref 70–99)
Glucose-Capillary: 188 mg/dL — ABNORMAL HIGH (ref 70–99)

## 2020-07-17 LAB — HEMOGLOBIN A1C
Hgb A1c MFr Bld: 6.1 % — ABNORMAL HIGH (ref 4.8–5.6)
Mean Plasma Glucose: 128.37 mg/dL

## 2020-07-17 MED ORDER — PREDNISONE 20 MG PO TABS
40.0000 mg | ORAL_TABLET | Freq: Every day | ORAL | Status: DC
  Administered 2020-07-18: 40 mg via ORAL
  Filled 2020-07-17: qty 2

## 2020-07-17 NOTE — Progress Notes (Signed)
The patient has remained off of oxygen and maintaining oxygen saturation 92-95%.  She reports having anxiety which makes her feel like she is more short of breath when ambulating.
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
## 2020-07-17 NOTE — Plan of Care (Signed)
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
## 2020-07-18 DIAGNOSIS — J441 Chronic obstructive pulmonary disease with (acute) exacerbation: Secondary | ICD-10-CM | POA: Diagnosis not present

## 2020-07-18 LAB — GLUCOSE, CAPILLARY: Glucose-Capillary: 106 mg/dL — ABNORMAL HIGH (ref 70–99)

## 2020-07-18 MED ORDER — PREDNISONE 10 MG PO TABS: ORAL_TABLET | ORAL | 0 refills | Status: AC

## 2020-07-18 MED ORDER — OXYCODONE HCL 5 MG PO TABS
5.0000 mg | ORAL_TABLET | ORAL | 0 refills | Status: AC | PRN
Start: 2020-07-18 — End: 2020-07-23

## 2020-07-18 MED ORDER — AZITHROMYCIN 500 MG PO TABS: 500.0000 mg | ORAL_TABLET | Freq: Every day | ORAL | 0 refills | Status: AC

## 2020-07-18 MED ORDER — LIDOCAINE 5 % EX PTCH: 1.0000 | MEDICATED_PATCH | CUTANEOUS | 0 refills | Status: DC

## 2020-07-18 MED FILL — AZITHROMYCIN 500 MG TABLET: 500 | 3 days supply | Qty: 3 | Fill #0

## 2020-07-18 MED FILL — oxyCODONE HCL 5 MG TABS: 5 | 5 days supply | Qty: 30 | Fill #0

## 2020-07-18 MED FILL — LIDOCAINE PATCH 5%: 5 | 30 days supply | Qty: 30 | Fill #0

## 2020-07-18 NOTE — Progress Notes (Signed)
Patient was discharged to home with her daughter.  Discharge packet provided and reviewed with the patient.
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
## 2020-07-18 NOTE — Discharge Instructions (Signed)
Chronic Obstructive Pulmonary Disease Exacerbation  Chronic obstructive pulmonary disease (COPD) is a long-term (chronic) condition that affects the lungs. COPD is a general term that can be used to describe many different lung problems that cause lung swelling (inflammation) and limit airflow, including chronic bronchitis and emphysema. COPD exacerbations are episodes when breathing symptoms become much worse and require extra treatment. COPD exacerbations are usually caused by infections. Without treatment, COPD exacerbations can be severe and even life threatening. Frequent COPD exacerbations can cause further damage to the lungs. What are the causes? This condition may be caused by:  Respiratory infections, including viral and bacterial infections.  Exposure to smoke.  Exposure to air pollution, chemical fumes, or dust.  Things that give you an allergic reaction (allergens).  Not taking your usual COPD medicines as directed.  Underlying medical problems, such as congestive heart failure or infections not involving the lungs. In many cases, the cause (trigger) of this condition is not known. What increases the risk? The following factors may make you more likely to develop this condition:  Smoking cigarettes.  Old age.  Frequent prior COPD exacerbations. What are the signs or symptoms? Symptoms of this condition include:  Increased coughing.  Increased production of mucus from your lungs (sputum).  Increased wheezing.  Increased shortness of breath.  Rapid or labored breathing.  Chest tightness.  Less energy than usual.  Sleep disruption from symptoms.  Confusion or increased sleepiness. Often these symptoms happen or get worse even with the use of medicines. How is this diagnosed? This condition is diagnosed based on:  Your medical history.  A physical exam. You may also have tests, including:  A chest X-ray.  Blood tests.  Lung (pulmonary) function  tests. How is this treated? Treatment for this condition depends on the severity and cause of the symptoms. You may need to be admitted to a hospital for treatment. Some of the treatments commonly used to treat COPD exacerbations are:  Antibiotic medicines. These may be used for severe exacerbations caused by a lung infection, such as pneumonia.  Bronchodilators. These are inhaled medicines that expand the air passages and allow increased airflow.  Steroid medicines. These act to reduce inflammation in the airways. They may be given with an inhaler, taken by mouth, or given through an IV tube inserted into one of your veins.  Supplemental oxygen therapy.  Airway clearing techniques, such as noninvasive ventilation (NIV) and positive expiratory pressure (PEP). These provide respiratory support through a mask or other noninvasive device. An example of this would be using a continuous positive airway pressure (CPAP) machine to improve delivery of oxygen into your lungs. Follow these instructions at home: Medicines  Take over-the-counter and prescription medicines only as told by your health care provider. It is important to use correct technique with inhaled medicines.  If you were prescribed an antibiotic medicine or oral steroid, take it as told by your health care provider. Do not stop taking the medicine even if you start to feel better. Lifestyle  Eat a healthy diet.  Exercise regularly.  Get plenty of sleep.  Avoid exposure to all substances that irritate the airway, especially to tobacco smoke.  Wash your hands often with soap and water to reduce the risk of infection. If soap and water are not available, use hand sanitizer.  During flu season, avoid enclosed spaces that are crowded with people. General instructions  Drink enough fluid to keep your urine clear or pale yellow (unless you have a medical   condition that requires fluid restriction).  Use a cool mist vaporizer. This  humidifies the air and makes it easier for you to clear your chest when you cough.  If you have a home nebulizer and oxygen, continue to use them as told by your health care provider.  Keep all follow-up visits as told by your health care provider. This is important. How is this prevented?  Stay up-to-date on pneumococcal and influenza (flu) vaccines. A flu shot is recommended every year to help prevent exacerbations.  Do not use any products that contain nicotine or tobacco, such as cigarettes and e-cigarettes. Quitting smoking is very important in preventing COPD from getting worse and in preventing exacerbations from happening as often. If you need help quitting, ask your health care provider.  Follow all instructions for pulmonary rehabilitation after a recent exacerbation. This can help prevent future exacerbations.  Work with your health care provider to develop and follow an action plan. This tells you what steps to take when you experience certain symptoms. Contact a health care provider if:  You have a worsening of your regular COPD symptoms. Get help right away if:  You have worsening shortness of breath, even when resting.  You have trouble talking.  You have severe chest pain.  You cough up blood.  You have a fever.  You have weakness, vomit repeatedly, or faint.  You feel confused.  You are not able to sleep because of your symptoms.  You have trouble doing daily activities. Summary  COPD exacerbations are episodes when breathing symptoms become much worse and require extra treatment above your normal treatment.  Exacerbations can be severe and even life threatening. Frequent COPD exacerbations can cause further damage to your lungs.  COPD exacerbations are usually triggered by infections such as the flu, colds, and even pneumonia.  Treatment for this condition depends on the severity and cause of the symptoms. You may need to be admitted to a hospital for  treatment.  Quitting smoking is very important to prevent COPD from getting worse and to prevent exacerbations from happening as often. This information is not intended to replace advice given to you by your health care provider. Make sure you discuss any questions you have with your health care provider. Document Revised: 10/29/2017 Document Reviewed: 12/21/2016 Elsevier Patient Education  2020 [place].   Chronic Obstructive Pulmonary Disease  Chronic obstructive pulmonary disease (COPD) is a long-term (chronic) condition that affects the lungs. COPD is a general term that can be used to describe many different lung problems that cause lung swelling (inflammation) and limit airflow, including chronic bronchitis and emphysema. If you have COPD, your lung function will probably never return to normal. In most cases, it gets worse over time. However, there are steps you can take to slow the progression of the disease and improve your quality of life. What are the causes? This condition may be caused by:  Smoking. This is the most common cause.  Certain genes passed down through families. What increases the risk? The following factors may make you more likely to develop this condition:  Secondhand smoke from cigarettes, pipes, or cigars.  Exposure to chemicals and other irritants such as fumes and dust in the work environment.  Chronic lung conditions or infections. What are the signs or symptoms? Symptoms of this condition include:  Shortness of breath, especially during physical activity.  Chronic cough with a large amount of thick mucus. Sometimes the cough may not have any mucus (dry  cough).  Wheezing.  Rapid breaths.  Gray or bluish discoloration (cyanosis) of the skin, especially in your fingers, toes, or lips.  Feeling tired (fatigue).  Weight loss.  Chest tightness.  Frequent infections.  Episodes when breathing symptoms become much worse  (exacerbations).  Swelling in the ankles, feet, or legs. This may occur in later stages of the disease. How is this diagnosed? This condition is diagnosed based on:  Your medical history.  A physical exam. You may also have tests, including:  Lung (pulmonary) function tests. This may include a spirometry test, which measures your ability to exhale properly.  Chest X-ray.  CT scan.  Blood tests. How is this treated? This condition may be treated with:  Medicines. These may include inhaled rescue medicines to treat acute exacerbations as well as long-term, or maintenance, medicines to prevent flare-ups of COPD. ? Bronchodilators help treat COPD by dilating the airways to allow increased airflow and make your breathing more comfortable. ? Steroids can reduce airway inflammation and help prevent exacerbations.  Smoking cessation. If you smoke, your health care provider may ask you to quit, and may also recommend therapy or replacement products to help you quit.  Pulmonary rehabilitation. This may involve working with a team of health care providers and specialists, such as respiratory, occupational, and physical therapists.  Exercise and physical activity. These are beneficial for nearly all people with COPD.  Nutrition therapy to gain weight, if you are underweight.  Oxygen. Supplemental oxygen therapy is only helpful if you have a low oxygen level in your blood (hypoxemia).  Lung surgery or transplant.  Palliative care. This is to help people with COPD feel comfortable when treatment is no longer working. Follow these instructions at home: Medicines  Take over-the-counter and prescription medicines (inhaled or pills) only as told by your health care provider.  Talk to your health care provider before taking any cough or allergy medicines. You may need to avoid certain medicines that dry out your airways. Lifestyle  If you are a smoker, the most important thing that you can  do is to stop smoking. Do not use any products that contain nicotine or tobacco, such as cigarettes and e-cigarettes. If you need help quitting, ask your health care provider. Continuing to smoke will cause the disease to progress faster.  Avoid exposure to things that irritate your lungs, such as smoke, chemicals, and fumes.  Stay active, but balance activity with periods of rest. Exercise and physical activity will help you maintain your ability to do things you want to do.  Learn and use relaxation techniques to manage stress and to control your breathing.  Get the right amount of sleep and get quality sleep. Most adults need 7 or more hours per night.  Eat healthy foods. Eating smaller, more frequent meals and resting before meals may help you maintain your strength. Controlled breathing Learn and use controlled breathing techniques as directed by your health care provider. Controlled breathing techniques include:  Pursed lip breathing. Start by breathing in (inhaling) through your nose for 1 second. Then, purse your lips as if you were going to whistle and breathe out (exhale) through the pursed lips for 2 seconds.  Diaphragmatic breathing. Start by putting one hand on your abdomen just above your waist. Inhale slowly through your nose. The hand on your abdomen should move out. Then purse your lips and exhale slowly. You should be able to feel the hand on your abdomen moving in as you exhale. Controlled coughing  Learn and use controlled coughing to clear mucus from your lungs. Controlled coughing is a series of short, progressive coughs. The steps of controlled coughing are: 1. Lean your head slightly forward. 2. Breathe in deeply using diaphragmatic breathing. 3. Try to hold your breath for 3 seconds. 4. Keep your mouth slightly open while coughing twice. 5. Spit any mucus out into a tissue. 6. Rest and repeat the steps once or twice as needed. General instructions  Make sure you  receive all the vaccines that your health care provider recommends, especially the pneumococcal and influenza vaccines. Preventing infection and hospitalization is very important when you have COPD.  Use oxygen therapy and pulmonary rehabilitation if directed to by your health care provider. If you require home oxygen therapy, ask your health care provider whether you should purchase a pulse oximeter to measure your oxygen level at home.  Work with your health care provider to develop a COPD action plan. This will help you know what steps to take if your condition gets worse.  Keep other chronic health conditions under control as told by your health care provider.  Avoid extreme temperature and humidity changes.  Avoid contact with people who have an illness that spreads from person to person (is contagious), such as viral infections or pneumonia.  Keep all follow-up visits as told by your health care provider. This is important. Contact a health care provider if:  You are coughing up more mucus than usual.  There is a change in the color or thickness of your mucus.  Your breathing is more labored than usual.  Your breathing is faster than usual.  You have difficulty sleeping.  You need to use your rescue medicines or inhalers more often than expected.  You have trouble doing routine activities such as getting dressed or walking around the house. Get help right away if:  You have shortness of breath while you are resting.  You have shortness of breath that prevents you from: ? Being able to talk. ? Performing your usual physical activities.  You have chest pain lasting longer than 5 minutes.  Your skin color is more blue (cyanotic) than usual.  You measure low oxygen saturations for longer than 5 minutes with a pulse oximeter.  You have a fever.  You feel too tired to breathe normally. Summary  Chronic obstructive pulmonary disease (COPD) is a long-term (chronic)  condition that affects the lungs.  Your lung function will probably never return to normal. In most cases, it gets worse over time. However, there are steps you can take to slow the progression of the disease and improve your quality of life.  Treatment for COPD may include taking medicines, quitting smoking, pulmonary rehabilitation, and changes to diet and exercise. As the disease progresses, you may need oxygen therapy, a lung transplant, or palliative care.  To help manage your condition, do not smoke, avoid exposure to things that irritate your lungs, stay up to date on all vaccines, and follow your health care provider's instructions for taking medicines. This information is not intended to replace advice given to you by your health care provider. Make sure you discuss any questions you have with your health care provider. Document Revised: 10/29/2017 Document Reviewed: 12/21/2016 Elsevier Patient Education  [place].   COPD and Physical Activity Chronic obstructive pulmonary disease (COPD) is a long-term (chronic) condition that affects the lungs. COPD is a general term that can be used to describe many different lung problems that cause  lung swelling (inflammation) and limit airflow, including chronic bronchitis and emphysema. The main symptom of COPD is shortness of breath, which makes it harder to do even simple tasks. This can also make it harder to exercise and be active. Talk with your health care provider about treatments to help you breathe better and actions you can take to prevent breathing problems during physical activity. What are the benefits of exercising with COPD? Exercising regularly is an important part of a healthy lifestyle. You can still exercise and do physical activities even though you have COPD. Exercise and physical activity improve your shortness of breath by increasing blood flow (circulation). This causes your heart to pump more oxygen through your body.  Moderate exercise can improve your:  Oxygen use.  Energy level.  Shortness of breath.  Strength in your breathing muscles.  Heart health.  Sleep.  Self-esteem and feelings of self-worth.  Depression, stress, and anxiety levels. Exercise can benefit everyone with COPD. The severity of your disease may affect how hard you can exercise, especially at first, but everyone can benefit. Talk with your health care provider about how much exercise is safe for you, and which activities and exercises are safe for you. What actions can I take to prevent breathing problems during physical activity?  Sign up for a pulmonary rehabilitation program. This type of program may include: ? Education about lung diseases. ? Exercise classes that teach you how to exercise and be more active while improving your breathing. This usually involves:  Exercise using your lower extremities, such as a stationary bicycle.  About 30 minutes of exercise, 2 to 5 times per week, for 6 to 12 weeks  Strength training, such as push ups or leg lifts. ? Nutrition education. ? Group classes in which you can talk with others who also have COPD and learn ways to manage stress.  If you use an oxygen tank, you should use it while you exercise. Work with your health care provider to adjust your oxygen for your physical activity. Your resting flow rate is different from your flow rate during physical activity.  While you are exercising: ? Take slow breaths. ? Pace yourself and do not try to go too fast. ? Purse your lips while breathing out. Pursing your lips is similar to a kissing or whistling position. ? If doing exercise that uses a quick burst of effort, such as weight lifting:  Breathe in before starting the exercise.  Breathe out during the hardest part of the exercise (such as raising the weights). Where to find support You can find support for exercising with COPD from:  Your health care provider.  A  pulmonary rehabilitation program.  Your local health department or community health programs.  Support groups, online or in-person. Your health care provider may be able to recommend support groups. Where to find more information You can find more information about exercising with COPD from:  American Lung Association: ClassInsider.se.  COPD Foundation: https://www.rivera.net/. Contact a health care provider if:  Your symptoms get worse.  You have chest pain.  You have nausea.  You have a fever.  You have trouble talking or catching your breath.  You want to start a new exercise program or a new activity. Summary  COPD is a general term that can be used to describe many different lung problems that cause lung swelling (inflammation) and limit airflow. This includes chronic bronchitis and emphysema.  Exercise and physical activity improve your shortness of breath by increasing  blood flow (circulation). This causes your heart to provide more oxygen to your body.  Contact your health care provider before starting any exercise program or new activity. Ask your health care provider what exercises and activities are safe for you. This information is not intended to replace advice given to you by your health care provider. Make sure you discuss any questions you have with your health care provider. Document Revised: 03/08/2019 Document Reviewed: 12/09/2017 Elsevier Patient Education  2020 [facility] Chronic Obstructive Pul[facility] Disease Chronic obstructive pulmonary disease (COPD) causes symptoms such as shortness of breath, coughing, and chest discomfort. These symptoms can make it difficult to eat enough to maintain a healthy weight. Generally, people with COPD should eat a diet that is high in calories, protein, and other nutrients to maintain body weight and to keep the lungs as healthy as possible. Depending on the medicines you take and other health conditions you may  have, your health care provider may give you additional recommendations on what to eat or avoid. Talk with your health care provider about your goals for body weight, and work with a dietitian to develop an eating plan that is right for you. What are tips for following this plan? Reading food labels   Avoid foods with more than 300 milligrams (mg) of salt (sodium) per serving.  Choose foods that contain at least 4 grams (g) of fiber per serving. Try to eat 20-30 g of fiber each day.  Choose foods that are high in calories and protein, such as nuts, beans, yogurt, and cheese. Shopping  Do not buy foods labeled as diet, low-calorie, or low-fat.  If you are able to eat dairy products: ? Avoid low-fat or skim milk. ? Buy dairy products that have at least 2% fat.  Buy nutritional supplement drinks.  Buy grains and prepared foods labeled as enriched or fortified.  Consider buying low-sodium, pre-made foods to conserve energy for eating. Cooking  Add dry milk or protein powder to smoothies.  Cook with healthy fats, such as olive oil, canola oil, sunflower oil, and grapeseed oil.  Add oil, butter, cream cheese, or nut butters to foods to increase fat and calories.  To make foods easier to chew and swallow: ? Cook vegetables, pasta, and rice until soft. ? Cut or grind meat into very small pieces. ? Dip breads in liquid. Meal planning   Eat when you feel hungry.  Eat 5-6 small meals throughout the day.  Drink 6-8 glasses of water each day.  Do not drink liquids with meals. Drink liquids at the end of the meal to avoid feeling full too quickly.  Eat a variety of fruits and vegetables every day.  Ask for assistance from family or friends with planning and preparing meals as needed.  Avoid foods that cause you to feel bloated, such as carbonated drinks, fried foods, beans, broccoli, cabbage, and apples.  For older adults, ask your local agency on aging whether you are eligible  for meal assistance programs, such as Meals on Wheels. Lifestyle   Do not smoke.  Eat slowly. Take small bites and chew food well before swallowing.  Do not overeat. This may make it more difficult to breathe after eating.  Sit up while eating.  If needed, continue to use supplemental oxygen while eating.  Rest or relax for 30 minutes before and after eating.  Monitor your weight as told by your health care provider.  Exercise as told by your  health care provider. What foods can I eat? Fruits All fresh, dried, canned, or frozen fruits that do not cause gas. Vegetables All fresh, canned (no salt added), or frozen vegetables that do not cause gas. Grains Whole grain bread. Enriched whole grain pasta. Fortified whole grain cereals. Fortified rice. Quinoa. Meats and other proteins Lean meat. Poultry. Fish. Dried beans. Unsalted nuts. Tofu. Eggs. Nut butters. Dairy Whole or 2% milk. Cheese. Yogurt. Fats and oils Olive oil. Canola oil. Butter. Margarine. Beverages Water. Vegetable juice (no salt added). Decaffeinated coffee. Decaffeinated or herbal tea. Seasonings and condiments Fresh or dried herbs. Low-salt or salt-free seasonings. Low-sodium soy sauce. The items listed above may not be a complete list of foods and beverages you can eat. Contact a dietitian for more information. What foods are not recommended? Fruits Fruits that cause gas, such as apples or melon. Vegetables Vegetables that cause gas, such as broccoli, Brussels sprouts, cabbage, cauliflower, and onions. Canned vegetables with added salt. Meats and other proteins Fried meat. Salt-cured meat. Processed meat. Dairy Fat-free or low-fat milk, yogurt, or cheese. Processed cheese. Beverages Carbonated drinks. Caffeinated drinks, such as coffee, tea, and soft drinks. Juice. Alcohol. Vegetable juice with added salt. Seasonings and condiments Salt. Seasoning mixes with salt. Soy sauce. Angie Fava. Other foods Clear  soup or broth. Fried foods. Prepared frozen meals. The items listed above may not be a complete list of foods and beverages you should avoid. Contact a dietitian for more information. Summary  COPD symptoms can make it difficult to eat enough to maintain a healthy weight.  A COPD eating plan can help you maintain your body weight and keep your lungs as healthy as possible.  Eat a diet that is high in calories, protein, and other nutrients. Read labels to make sure that you are getting the right nutrients. Cook foods to make them easy to chew and swallow.  Eat 5-6 small meals throughout the day, and avoid foods that cause gas or make you feel bloated. This information is not intended to replace advice given to you by your health care provider. Make sure you discuss any questions you have with your health care provider. Document Revised: 03/09/2019 Document Reviewed: 02/01/2018 Elsevier Patient Education  [place].  Thoracic Spine Fracture A thoracic spine fracture is a break in one of the bones of the middle part of the back. The fracture can be mild or very bad. The most serious types cause the broken bones to:  Move out of place (unstable).  Damage or press on the main nerve in the spine (spinal cord). In some cases, the bone that connects to the lower part of the back may also have a break (thoracolumbar fracture). What are the causes? This condition may be caused by:  A car accident.  A fall.  A sports accident.  Violent acts. These include assaults or gunshots. What are the signs or symptoms? Symptoms may include:  Back pain.  Trouble standing or walking.  Numbness.  Tingling.  Weakness.  Loss of movement.  Being unable to control when to pee or poop (incontinence). How is this treated? Treatment may include:  Medicines.  A cast or a brace.  Physical therapy.  Surgery. This may be needed for very bad fractures. Follow these instructions at  home: Medicines  Take medicines only as told by your doctor.  Do not drive or use heavy machinery while taking pain medicine.  To prevent or treat trouble pooping (constipation) while you are taking prescription  pain medicine, your doctor may recommend that you: ? Drink enough fluid to keep your pee (urine) pale yellow. ? Take over-the-counter or prescription medicines. ? Eat foods that are high in fiber. This includes fresh fruits and vegetables, whole grains, and beans. ? Limit foods that are high in fat and processed sugars. This includes fried or sweet foods. If you have a brace:  Wear the back brace as told by your doctor. Remove it only as told by your doctor.  Keep the brace clean.  If the brace is not waterproof: ? Do not let it get wet. ? Cover it with a watertight covering when you take a bath or a shower. Activity  Stay in bed (on bed rest) only as told by your doctor.  Ask your doctor what is safe for you to do.  Return to your normal activities as told by your doctor.  Do back exercises (physical therapy) as told by your doctor.  Exercise often as told by your doctor. Managing pain, stiffness, and swelling   If told, put ice on the injured area: ? Put ice in a plastic bag. ? Place a towel between your skin and the bag. ? Leave the ice on for 20 minutes, 2-3 times a day. General instructions  Do not use any products that contain nicotine or tobacco, such as cigarettes and e-cigarettes. If you need help quitting, ask your doctor.  Do not drink alcohol.  Keep all follow-up visits as told by your doctor. This is important. Contact a doctor if:  You have a fever.  You have a cough that makes your pain worse.  Your pain medicine is not helping.  Your pain does not get better over time.  You cannot return to your normal activities as planned. Get help right away if:  Your pain is bad and it suddenly gets worse.  You are not able to move any part of  your body (paralysis) that is below the level of your injury.  You have numbness, tingling, or weakness in any part of your body that is below the level of your injury.  You cannot control when you pee (urinate) or when you poop (pass stool). Summary  A thoracic spine fracture is a break in one of the bones of the middle part of the back.  A stable fracture can be treated with a back brace, activity restrictions, pain medicine, and physical therapy. A more severe fracture may require surgery.  Make sure you know what symptoms should cause you to get help right away. This information is not intended to replace advice given to you by your health care provider. Make sure you discuss any questions you have with your health care provider. Document Revised: 12/31/2017 Document Reviewed: 12/31/2017 Elsevier Patient Education  [place].  Sternal Fracture  A sternal fracture is a break in the bone in the center of the chest (sternum or breastbone). This type of fracture often causes pain that can get worse when you breathe deeply or cough. A sternal fracture is not dangerous unless there is also an injury to your heart or lungs, which are protected by the sternum and ribs. What are the causes? This condition is usually caused by a forceful injury from:  Motor vehicle accidents. This is the most common cause.  Contact sports.  Physical assaults.  Falls. You can also develop a sternal fracture without having a forceful injury if the bone becomes weakened over time (stress fracture or insufficiency fracture).  What increases the risk? You are more likely to have a sternal fracture if you:  Participate in contact sports, such as football, lacrosse, wrestling, or martial arts.  Work at elevated heights, such as in Architect. This increases your risk of a fall. The following factors may make you more likely to develop a stress fracture or insufficiency fracture:  Being  female.  Being a postmenopausal woman.  Being 63 years of age or older.  Having weak bones (osteoporosis).  Having severe curvature of the spine.  Being on long-term steroid treatment. What are the signs or symptoms? Symptoms of this condition include:  Pain over the sternum or chest wall.  Tenderness of the sternum or chest wall.  Pain that gets worse when you breathe deeply or cough.  Shortness of breath.  A bruise (contusion) over the chest.  Swelling.  A crackling sound when taking a deep breath or pressing on the sternum. How is this diagnosed? This condition is diagnosed based on:  A physical exam.  Your medical history.  Tests, such as: ? Blood oxygen level. This is measured with a pulse oximetry test. ? Repeated electrocardiograms (ECGs). This is to make sure that your heart is not injured. ? A blood test. This is to check for damage to your heart muscle. ? Imaging tests, such as:  A CT scan.  An ultrasound.  Chest X-rays. How is this treated? Treatment depends on the severity of your injury.  A sternal fracture without any other injury (isolated sternal fracture) usually heals without treatment. You may need to: ? Limit some activities at home. ? Take medicines for pain relief. ? Do deep breathing exercises to prevent injury and infection to your lungs.  In rare cases, surgery may be needed if a sternal fracture: ? Continues to cause severe pain. ? Causes shortness of breath or respiratory problems. ? Involves bones that have been moved too far out of position (displaced fracture). Follow these instructions at home: Managing pain, stiffness, and swelling   If directed, put ice on the injured area. ? Put ice in a plastic bag. ? Place a towel between your skin and the bag. ? Leave the ice on for 20 minutes, 2-3 times a day. Medicines  Take over-the-counter and prescription medicines only as told by your health care provider.  Ask your health  care provider if the medicine prescribed to you: ? Requires you to avoid driving or using heavy machinery. ? Can cause constipation. You may need to take actions to prevent or treat constipation, such as:  Drink enough fluid to keep your urine pale yellow.  Take over-the-counter or prescription medicines.  Eat foods that are high in fiber, such as beans, whole grains, and fresh fruits and vegetables.  Limit foods that are high in fat and processed sugars, such as fried or sweet foods. Activity  Rest at home.  Return to your normal activities as told by your health care provider. Ask your health care provider what activities are safe for you.  Do breathing exercises as told by your health care provider.  Do not push or pull with your arms when getting in and out of bed.  Do not lift anything that is heavier than 10 lb (4.5 kg), or the limit that you are told, until your health care provider says that it is safe. General instructions  Hug a pillow when you sneeze, cough, or twist or bend at the waist. Doing this helps support your chest.  Do  not use any products that contain nicotine or tobacco, such as cigarettes, e-cigarettes, and chewing tobacco. These can delay bone healing. If you need help quitting, ask your health care provider.  Keep all follow-up visits as told by your health care provider. This is important. Contact a health care provider if:  Your pain medicine is not helping.  You continue to have pain after several weeks.  You have swelling or bruising that gets worse.  You develop a fever or chills.  You develop a cough and you cough up thick or bloody mucus from your lungs (sputum). Get help right away if you:  Have difficulty breathing.  Have chest pain.  Feel light-headed.  Have fast or irregular heartbeats (palpitations).  Feel nauseous or have pain in your abdomen. Summary  A sternal fracture is a break in the bone in the center of the chest  (sternum or breastbone).  This condition is usually caused by a forceful injury. The most common cause is motor vehicle accidents.  If directed, put ice on the injured area.  Return to your normal activities as told by your health care provider. Ask your health care provider what activities are safe for you. This information is not intended to replace advice given to you by your health care provider. Make sure you discuss any questions you have with your health care provider. Document Revised: 11/17/2018 Document Reviewed: 11/17/2018 Elsevier Patient Education  [place].
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
## 2020-07-18 NOTE — Plan of Care (Signed)

## 2020-07-19 DIAGNOSIS — E785 Hyperlipidemia, unspecified: Secondary | ICD-10-CM | POA: Diagnosis not present

## 2020-07-19 DIAGNOSIS — I739 Peripheral vascular disease, unspecified: Secondary | ICD-10-CM | POA: Diagnosis not present

## 2020-07-19 DIAGNOSIS — I701 Atherosclerosis of renal artery: Secondary | ICD-10-CM | POA: Diagnosis not present

## 2020-07-19 DIAGNOSIS — Z7901 Long term (current) use of anticoagulants: Secondary | ICD-10-CM | POA: Diagnosis not present

## 2020-07-19 DIAGNOSIS — K219 Gastro-esophageal reflux disease without esophagitis: Secondary | ICD-10-CM | POA: Diagnosis not present

## 2020-07-19 DIAGNOSIS — J441 Chronic obstructive pulmonary disease with (acute) exacerbation: Secondary | ICD-10-CM | POA: Diagnosis not present

## 2020-07-19 DIAGNOSIS — S22019D Unspecified fracture of first thoracic vertebra, subsequent encounter for fracture with routine healing: Secondary | ICD-10-CM | POA: Diagnosis not present

## 2020-07-19 DIAGNOSIS — M19042 Primary osteoarthritis, left hand: Secondary | ICD-10-CM | POA: Diagnosis not present

## 2020-07-19 DIAGNOSIS — E43 Unspecified severe protein-calorie malnutrition: Secondary | ICD-10-CM | POA: Diagnosis not present

## 2020-07-19 DIAGNOSIS — G43909 Migraine, unspecified, not intractable, without status migrainosus: Secondary | ICD-10-CM | POA: Diagnosis not present

## 2020-07-19 DIAGNOSIS — Z7902 Long term (current) use of antithrombotics/antiplatelets: Secondary | ICD-10-CM | POA: Diagnosis not present

## 2020-07-19 DIAGNOSIS — Z90710 Acquired absence of both cervix and uterus: Secondary | ICD-10-CM | POA: Diagnosis not present

## 2020-07-19 DIAGNOSIS — Z87891 Personal history of nicotine dependence: Secondary | ICD-10-CM | POA: Diagnosis not present

## 2020-07-19 DIAGNOSIS — I10 Essential (primary) hypertension: Secondary | ICD-10-CM | POA: Diagnosis not present

## 2020-07-19 DIAGNOSIS — D649 Anemia, unspecified: Secondary | ICD-10-CM | POA: Diagnosis not present

## 2020-07-19 DIAGNOSIS — Z95828 Presence of other vascular implants and grafts: Secondary | ICD-10-CM | POA: Diagnosis not present

## 2020-07-19 DIAGNOSIS — G8929 Other chronic pain: Secondary | ICD-10-CM | POA: Diagnosis not present

## 2020-07-19 DIAGNOSIS — I700 Atherosclerosis of aorta: Secondary | ICD-10-CM | POA: Diagnosis not present

## 2020-07-19 DIAGNOSIS — Z7952 Long term (current) use of systemic steroids: Secondary | ICD-10-CM | POA: Diagnosis not present

## 2020-07-19 DIAGNOSIS — M19041 Primary osteoarthritis, right hand: Secondary | ICD-10-CM | POA: Diagnosis not present

## 2020-07-19 DIAGNOSIS — M545 Low back pain: Secondary | ICD-10-CM | POA: Diagnosis not present

## 2020-07-19 DIAGNOSIS — Z9089 Acquired absence of other organs: Secondary | ICD-10-CM | POA: Diagnosis not present

## 2020-07-19 DIAGNOSIS — S2220XD Unspecified fracture of sternum, subsequent encounter for fracture with routine healing: Secondary | ICD-10-CM | POA: Diagnosis not present

## 2020-07-19 DIAGNOSIS — S22029D Unspecified fracture of second thoracic vertebra, subsequent encounter for fracture with routine healing: Secondary | ICD-10-CM | POA: Diagnosis not present

## 2020-07-22 DIAGNOSIS — M545 Low back pain: Secondary | ICD-10-CM | POA: Diagnosis not present

## 2020-07-22 DIAGNOSIS — G8929 Other chronic pain: Secondary | ICD-10-CM | POA: Diagnosis not present

## 2020-07-22 DIAGNOSIS — J441 Chronic obstructive pulmonary disease with (acute) exacerbation: Secondary | ICD-10-CM | POA: Diagnosis not present

## 2020-07-22 DIAGNOSIS — S22019D Unspecified fracture of first thoracic vertebra, subsequent encounter for fracture with routine healing: Secondary | ICD-10-CM | POA: Diagnosis not present

## 2020-07-22 DIAGNOSIS — S2220XD Unspecified fracture of sternum, subsequent encounter for fracture with routine healing: Secondary | ICD-10-CM | POA: Diagnosis not present

## 2020-07-22 DIAGNOSIS — S22029D Unspecified fracture of second thoracic vertebra, subsequent encounter for fracture with routine healing: Secondary | ICD-10-CM | POA: Diagnosis not present

## 2020-07-23 ENCOUNTER — Ambulatory Visit (INDEPENDENT_AMBULATORY_CARE_PROVIDER_SITE_OTHER): Payer: Medicare Other | Admitting: Vascular Surgery

## 2020-07-23 ENCOUNTER — Other Ambulatory Visit: Payer: Self-pay

## 2020-07-23 ENCOUNTER — Encounter: Payer: Self-pay | Admitting: Vascular Surgery

## 2020-07-23 VITALS — BP 124/53 | HR 61 | Temp 96.8°F | Resp 14 | Ht 61.0 in | Wt 78.0 lb

## 2020-07-23 DIAGNOSIS — K551 Chronic vascular disorders of intestine: Secondary | ICD-10-CM

## 2020-07-23 DIAGNOSIS — J441 Chronic obstructive pulmonary disease with (acute) exacerbation: Secondary | ICD-10-CM | POA: Diagnosis not present

## 2020-07-23 DIAGNOSIS — G8929 Other chronic pain: Secondary | ICD-10-CM | POA: Diagnosis not present

## 2020-07-23 DIAGNOSIS — M545 Low back pain: Secondary | ICD-10-CM | POA: Diagnosis not present

## 2020-07-23 DIAGNOSIS — S22019D Unspecified fracture of first thoracic vertebra, subsequent encounter for fracture with routine healing: Secondary | ICD-10-CM | POA: Diagnosis not present

## 2020-07-23 DIAGNOSIS — S22029D Unspecified fracture of second thoracic vertebra, subsequent encounter for fracture with routine healing: Secondary | ICD-10-CM | POA: Diagnosis not present

## 2020-07-23 DIAGNOSIS — S2220XD Unspecified fracture of sternum, subsequent encounter for fracture with routine healing: Secondary | ICD-10-CM | POA: Diagnosis not present
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
## 2020-07-25 ENCOUNTER — Other Ambulatory Visit: Payer: Self-pay | Admitting: *Deleted

## 2020-07-25 DIAGNOSIS — K551 Chronic vascular disorders of intestine: Secondary | ICD-10-CM

## 2020-07-29 DIAGNOSIS — G8929 Other chronic pain: Secondary | ICD-10-CM | POA: Diagnosis not present

## 2020-07-29 DIAGNOSIS — M545 Low back pain: Secondary | ICD-10-CM | POA: Diagnosis not present

## 2020-07-29 DIAGNOSIS — S22029D Unspecified fracture of second thoracic vertebra, subsequent encounter for fracture with routine healing: Secondary | ICD-10-CM | POA: Diagnosis not present

## 2020-07-29 DIAGNOSIS — J441 Chronic obstructive pulmonary disease with (acute) exacerbation: Secondary | ICD-10-CM | POA: Diagnosis not present

## 2020-07-29 DIAGNOSIS — S2220XD Unspecified fracture of sternum, subsequent encounter for fracture with routine healing: Secondary | ICD-10-CM | POA: Diagnosis not present

## 2020-07-29 DIAGNOSIS — S22019D Unspecified fracture of first thoracic vertebra, subsequent encounter for fracture with routine healing: Secondary | ICD-10-CM | POA: Diagnosis not present

## 2020-08-01 DIAGNOSIS — J441 Chronic obstructive pulmonary disease with (acute) exacerbation: Secondary | ICD-10-CM | POA: Diagnosis not present

## 2020-08-01 DIAGNOSIS — M545 Low back pain: Secondary | ICD-10-CM | POA: Diagnosis not present

## 2020-08-01 DIAGNOSIS — S2220XD Unspecified fracture of sternum, subsequent encounter for fracture with routine healing: Secondary | ICD-10-CM | POA: Diagnosis not present

## 2020-08-01 DIAGNOSIS — S22019D Unspecified fracture of first thoracic vertebra, subsequent encounter for fracture with routine healing: Secondary | ICD-10-CM | POA: Diagnosis not present

## 2020-08-01 DIAGNOSIS — S22029D Unspecified fracture of second thoracic vertebra, subsequent encounter for fracture with routine healing: Secondary | ICD-10-CM | POA: Diagnosis not present

## 2020-08-01 DIAGNOSIS — G8929 Other chronic pain: Secondary | ICD-10-CM | POA: Diagnosis not present

## 2020-08-06 DIAGNOSIS — J441 Chronic obstructive pulmonary disease with (acute) exacerbation: Secondary | ICD-10-CM | POA: Diagnosis not present

## 2020-08-06 DIAGNOSIS — S2220XD Unspecified fracture of sternum, subsequent encounter for fracture with routine healing: Secondary | ICD-10-CM | POA: Diagnosis not present

## 2020-08-06 DIAGNOSIS — M545 Low back pain: Secondary | ICD-10-CM | POA: Diagnosis not present

## 2020-08-06 DIAGNOSIS — G8929 Other chronic pain: Secondary | ICD-10-CM | POA: Diagnosis not present

## 2020-08-06 DIAGNOSIS — S22019D Unspecified fracture of first thoracic vertebra, subsequent encounter for fracture with routine healing: Secondary | ICD-10-CM | POA: Diagnosis not present

## 2020-08-06 DIAGNOSIS — S22029D Unspecified fracture of second thoracic vertebra, subsequent encounter for fracture with routine healing: Secondary | ICD-10-CM | POA: Diagnosis not present

## 2020-08-07 DIAGNOSIS — E785 Hyperlipidemia, unspecified: Secondary | ICD-10-CM | POA: Diagnosis not present

## 2020-08-07 DIAGNOSIS — F329 Major depressive disorder, single episode, unspecified: Secondary | ICD-10-CM | POA: Diagnosis not present

## 2020-08-07 DIAGNOSIS — E871 Hypo-osmolality and hyponatremia: Secondary | ICD-10-CM | POA: Diagnosis not present

## 2020-08-07 DIAGNOSIS — N1831 Chronic kidney disease, stage 3a: Secondary | ICD-10-CM | POA: Diagnosis not present

## 2020-08-07 DIAGNOSIS — R7301 Impaired fasting glucose: Secondary | ICD-10-CM | POA: Diagnosis not present

## 2020-08-07 DIAGNOSIS — I700 Atherosclerosis of aorta: Secondary | ICD-10-CM | POA: Diagnosis not present

## 2020-08-07 DIAGNOSIS — G894 Chronic pain syndrome: Secondary | ICD-10-CM | POA: Diagnosis not present

## 2020-08-07 DIAGNOSIS — R636 Underweight: Secondary | ICD-10-CM | POA: Diagnosis not present

## 2020-08-07 DIAGNOSIS — M810 Age-related osteoporosis without current pathological fracture: Secondary | ICD-10-CM | POA: Diagnosis not present

## 2020-08-07 DIAGNOSIS — J432 Centrilobular emphysema: Secondary | ICD-10-CM | POA: Diagnosis not present

## 2020-08-07 DIAGNOSIS — S2222XG Fracture of body of sternum, subsequent encounter for fracture with delayed healing: Secondary | ICD-10-CM | POA: Diagnosis not present

## 2020-08-07 DIAGNOSIS — I10 Essential (primary) hypertension: Secondary | ICD-10-CM | POA: Diagnosis not present

## 2020-08-08 DIAGNOSIS — S22029D Unspecified fracture of second thoracic vertebra, subsequent encounter for fracture with routine healing: Secondary | ICD-10-CM | POA: Diagnosis not present

## 2020-08-08 DIAGNOSIS — G8929 Other chronic pain: Secondary | ICD-10-CM | POA: Diagnosis not present

## 2020-08-08 DIAGNOSIS — M545 Low back pain: Secondary | ICD-10-CM | POA: Diagnosis not present

## 2020-08-08 DIAGNOSIS — S22019D Unspecified fracture of first thoracic vertebra, subsequent encounter for fracture with routine healing: Secondary | ICD-10-CM | POA: Diagnosis not present

## 2020-08-08 DIAGNOSIS — J441 Chronic obstructive pulmonary disease with (acute) exacerbation: Secondary | ICD-10-CM | POA: Diagnosis not present

## 2020-08-08 DIAGNOSIS — S2220XD Unspecified fracture of sternum, subsequent encounter for fracture with routine healing: Secondary | ICD-10-CM | POA: Diagnosis not present

## 2020-08-15 DIAGNOSIS — M545 Low back pain: Secondary | ICD-10-CM | POA: Diagnosis not present

## 2020-08-15 DIAGNOSIS — J441 Chronic obstructive pulmonary disease with (acute) exacerbation: Secondary | ICD-10-CM | POA: Diagnosis not present

## 2020-08-15 DIAGNOSIS — G8929 Other chronic pain: Secondary | ICD-10-CM | POA: Diagnosis not present

## 2020-08-15 DIAGNOSIS — S22029D Unspecified fracture of second thoracic vertebra, subsequent encounter for fracture with routine healing: Secondary | ICD-10-CM | POA: Diagnosis not present

## 2020-08-15 DIAGNOSIS — S2220XD Unspecified fracture of sternum, subsequent encounter for fracture with routine healing: Secondary | ICD-10-CM | POA: Diagnosis not present

## 2020-08-15 DIAGNOSIS — S22019D Unspecified fracture of first thoracic vertebra, subsequent encounter for fracture with routine healing: Secondary | ICD-10-CM | POA: Diagnosis not present

## 2020-08-18 DIAGNOSIS — Z7952 Long term (current) use of systemic steroids: Secondary | ICD-10-CM | POA: Diagnosis not present

## 2020-08-18 DIAGNOSIS — S22029D Unspecified fracture of second thoracic vertebra, subsequent encounter for fracture with routine healing: Secondary | ICD-10-CM | POA: Diagnosis not present

## 2020-08-18 DIAGNOSIS — S2220XD Unspecified fracture of sternum, subsequent encounter for fracture with routine healing: Secondary | ICD-10-CM | POA: Diagnosis not present

## 2020-08-18 DIAGNOSIS — I10 Essential (primary) hypertension: Secondary | ICD-10-CM | POA: Diagnosis not present

## 2020-08-18 DIAGNOSIS — Z7902 Long term (current) use of antithrombotics/antiplatelets: Secondary | ICD-10-CM | POA: Diagnosis not present

## 2020-08-18 DIAGNOSIS — E785 Hyperlipidemia, unspecified: Secondary | ICD-10-CM | POA: Diagnosis not present

## 2020-08-18 DIAGNOSIS — M545 Low back pain, unspecified: Secondary | ICD-10-CM | POA: Diagnosis not present

## 2020-08-18 DIAGNOSIS — G43909 Migraine, unspecified, not intractable, without status migrainosus: Secondary | ICD-10-CM | POA: Diagnosis not present

## 2020-08-18 DIAGNOSIS — E43 Unspecified severe protein-calorie malnutrition: Secondary | ICD-10-CM | POA: Diagnosis not present

## 2020-08-18 DIAGNOSIS — S22019D Unspecified fracture of first thoracic vertebra, subsequent encounter for fracture with routine healing: Secondary | ICD-10-CM | POA: Diagnosis not present

## 2020-08-18 DIAGNOSIS — Z90710 Acquired absence of both cervix and uterus: Secondary | ICD-10-CM | POA: Diagnosis not present

## 2020-08-18 DIAGNOSIS — I700 Atherosclerosis of aorta: Secondary | ICD-10-CM | POA: Diagnosis not present

## 2020-08-18 DIAGNOSIS — G8929 Other chronic pain: Secondary | ICD-10-CM | POA: Diagnosis not present

## 2020-08-18 DIAGNOSIS — Z7901 Long term (current) use of anticoagulants: Secondary | ICD-10-CM | POA: Diagnosis not present

## 2020-08-18 DIAGNOSIS — K219 Gastro-esophageal reflux disease without esophagitis: Secondary | ICD-10-CM | POA: Diagnosis not present

## 2020-08-18 DIAGNOSIS — Z95828 Presence of other vascular implants and grafts: Secondary | ICD-10-CM | POA: Diagnosis not present

## 2020-08-18 DIAGNOSIS — I701 Atherosclerosis of renal artery: Secondary | ICD-10-CM | POA: Diagnosis not present

## 2020-08-18 DIAGNOSIS — J441 Chronic obstructive pulmonary disease with (acute) exacerbation: Secondary | ICD-10-CM | POA: Diagnosis not present

## 2020-08-18 DIAGNOSIS — Z9089 Acquired absence of other organs: Secondary | ICD-10-CM | POA: Diagnosis not present

## 2020-08-18 DIAGNOSIS — Z87891 Personal history of nicotine dependence: Secondary | ICD-10-CM | POA: Diagnosis not present

## 2020-08-18 DIAGNOSIS — M19041 Primary osteoarthritis, right hand: Secondary | ICD-10-CM | POA: Diagnosis not present

## 2020-08-18 DIAGNOSIS — D649 Anemia, unspecified: Secondary | ICD-10-CM | POA: Diagnosis not present

## 2020-08-18 DIAGNOSIS — I739 Peripheral vascular disease, unspecified: Secondary | ICD-10-CM | POA: Diagnosis not present

## 2020-08-18 DIAGNOSIS — M19042 Primary osteoarthritis, left hand: Secondary | ICD-10-CM | POA: Diagnosis not present

## 2020-08-19 ENCOUNTER — Institutional Professional Consult (permissible substitution): Payer: Medicare Other | Admitting: Pulmonary Disease

## 2020-08-19 ENCOUNTER — Encounter: Payer: Self-pay | Admitting: Pulmonary Disease

## 2020-08-19 ENCOUNTER — Ambulatory Visit (INDEPENDENT_AMBULATORY_CARE_PROVIDER_SITE_OTHER): Payer: Medicare Other | Admitting: Pulmonary Disease

## 2020-08-19 ENCOUNTER — Other Ambulatory Visit: Payer: Self-pay

## 2020-08-19 VITALS — BP 114/68 | HR 58 | Temp 98.3°F | Ht 62.0 in | Wt 80.8 lb

## 2020-08-19 DIAGNOSIS — R0609 Other forms of dyspnea: Secondary | ICD-10-CM

## 2020-08-19 DIAGNOSIS — I75021 Atheroembolism of right lower extremity: Secondary | ICD-10-CM

## 2020-08-19 DIAGNOSIS — J439 Emphysema, unspecified: Secondary | ICD-10-CM

## 2020-08-19 DIAGNOSIS — S2220XD Unspecified fracture of sternum, subsequent encounter for fracture with routine healing: Secondary | ICD-10-CM | POA: Diagnosis not present

## 2020-08-19 DIAGNOSIS — G8929 Other chronic pain: Secondary | ICD-10-CM | POA: Diagnosis not present

## 2020-08-19 DIAGNOSIS — R0600 Dyspnea, unspecified: Secondary | ICD-10-CM

## 2020-08-19 DIAGNOSIS — M545 Low back pain, unspecified: Secondary | ICD-10-CM | POA: Diagnosis not present

## 2020-08-19 DIAGNOSIS — S22019D Unspecified fracture of first thoracic vertebra, subsequent encounter for fracture with routine healing: Secondary | ICD-10-CM | POA: Diagnosis not present

## 2020-08-19 DIAGNOSIS — J441 Chronic obstructive pulmonary disease with (acute) exacerbation: Secondary | ICD-10-CM | POA: Diagnosis not present

## 2020-08-19 DIAGNOSIS — S22029D Unspecified fracture of second thoracic vertebra, subsequent encounter for fracture with routine healing: Secondary | ICD-10-CM | POA: Diagnosis not present

## 2020-08-19 MED ORDER — TRELEGY ELLIPTA 100-62.5-25 MCG/INH IN AEPB: 1.0000 | INHALATION_SPRAY | Freq: Every day | RESPIRATORY_TRACT | 6 refills | Status: DC
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
## 2020-08-19 NOTE — Patient Instructions (Signed)
-   Start Trelegy Ellipta inhaler, 1 puff daily - Stop Wixela inhaler - Continue albuterol inhaler as needed - Continue incentive spirometry multiple times a day - We will check pulmonary function tests and refer you to pulmonary rehab once those are completed

## 2020-08-20 ENCOUNTER — Institutional Professional Consult (permissible substitution): Payer: Medicare Other | Admitting: Pulmonary Disease

## 2020-08-21 DIAGNOSIS — M545 Low back pain, unspecified: Secondary | ICD-10-CM | POA: Diagnosis not present

## 2020-08-21 DIAGNOSIS — G8929 Other chronic pain: Secondary | ICD-10-CM | POA: Diagnosis not present

## 2020-08-21 DIAGNOSIS — J441 Chronic obstructive pulmonary disease with (acute) exacerbation: Secondary | ICD-10-CM | POA: Diagnosis not present

## 2020-08-21 DIAGNOSIS — S2220XD Unspecified fracture of sternum, subsequent encounter for fracture with routine healing: Secondary | ICD-10-CM | POA: Diagnosis not present

## 2020-08-21 DIAGNOSIS — S22029D Unspecified fracture of second thoracic vertebra, subsequent encounter for fracture with routine healing: Secondary | ICD-10-CM | POA: Diagnosis not present

## 2020-08-21 DIAGNOSIS — S22019D Unspecified fracture of first thoracic vertebra, subsequent encounter for fracture with routine healing: Secondary | ICD-10-CM | POA: Diagnosis not present

## 2020-08-21 IMAGING — CT CT ABD-PELV W/ CM
1 of 3 series · 13 of 32 positions shown, 18 images · IV contrast (APPLIED)
Comparison: 06/28/2015

CLINICAL DATA: Mid abdominal pain for 3 weeks extending into chest
throat, former smoker, hypertension, prior hysterectomy

EXAM:
CT ABDOMEN AND PELVIS WITH CONTRAST
TECHNIQUE: Multidetector CT imaging of the abdomen and pelvis was performed
using the standard protocol following bolus administration of
intravenous contrast.
Creatinine was obtained on site at [HOSPITAL] at [HOSPITAL].
Results: Creatinine 0.8 mg/dL.
CONTRAST:  100mL [id number] IOPAMIDOL ([id number]) INJECTION 61% IV.
Dilute oral contrast.

[Series 2: abd/pelvis w/cm · axial · 0.63mm/px · z∈[-395,-85]mm · 13 of 72 slices shown, 18 images]
[im 5/72  soft-tissue]
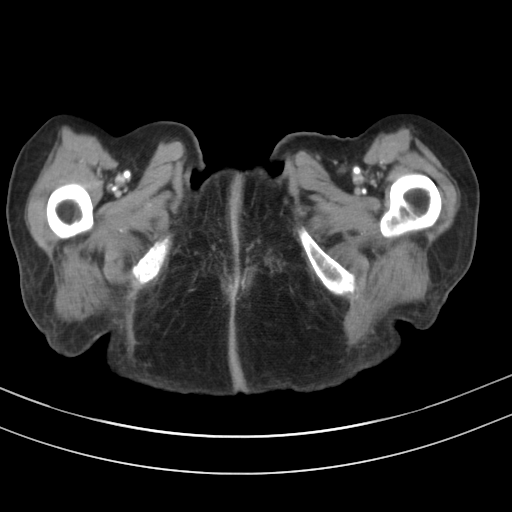
[im 5/72  bone]
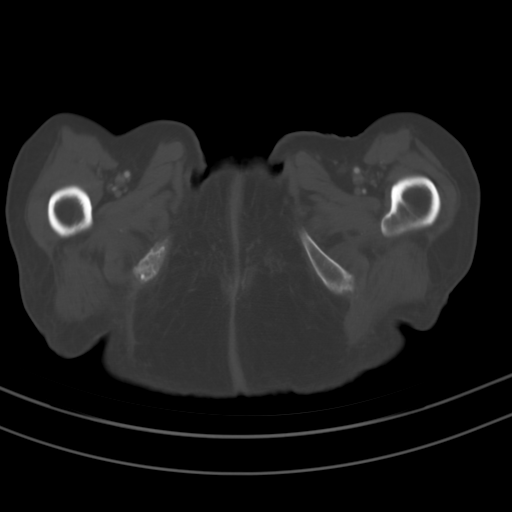
[im 10/72  soft-tissue]
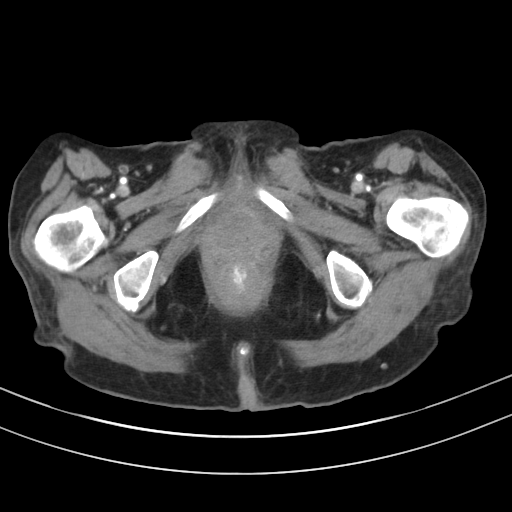
[im 15/72  soft-tissue]
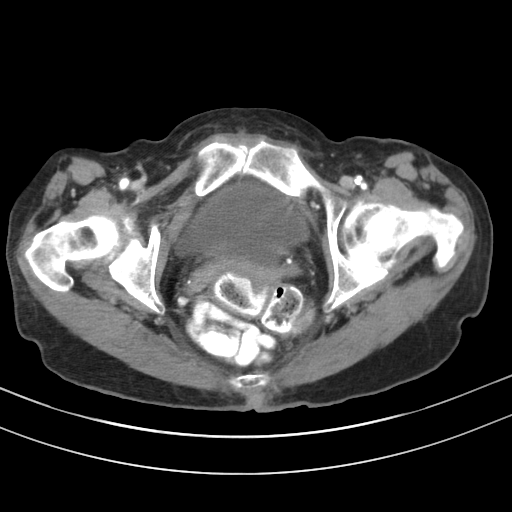
[im 24/72  soft-tissue]
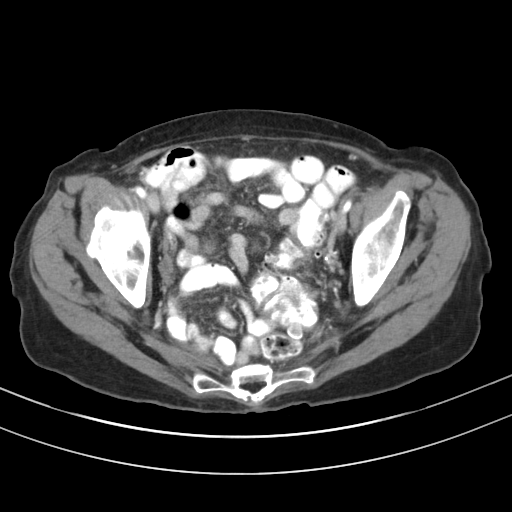
[im 29/72  soft-tissue]
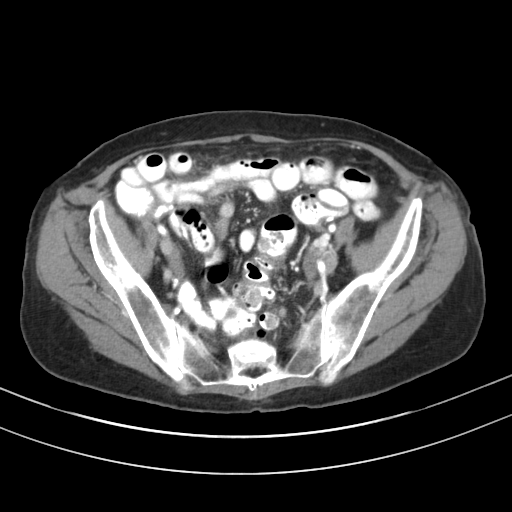
[im 34/72  soft-tissue]
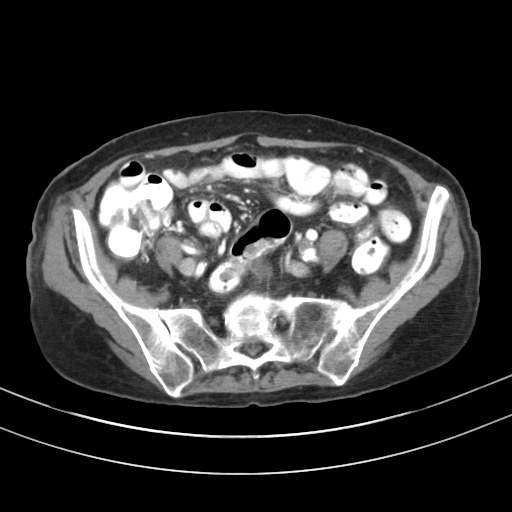
[im 38/72  soft-tissue]
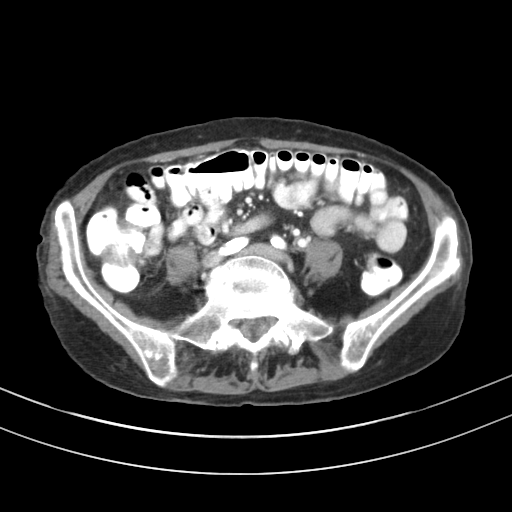
[im 43/72  soft-tissue]
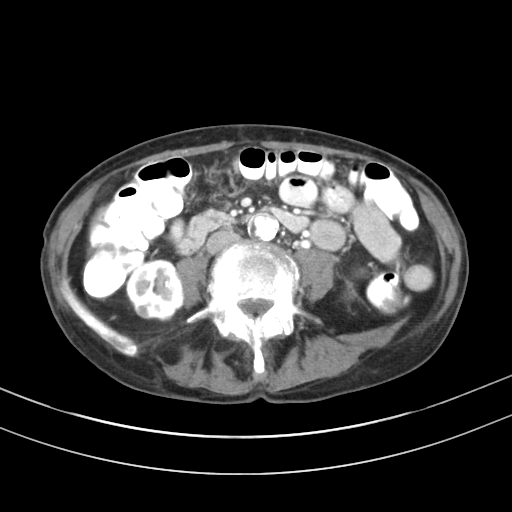
[im 48/72  soft-tissue]
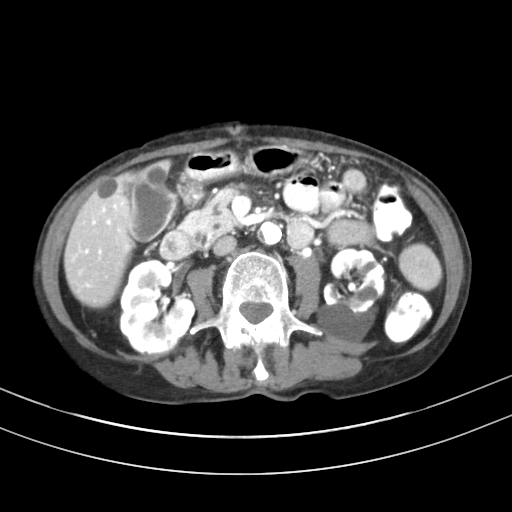
[im 48/72  bone]
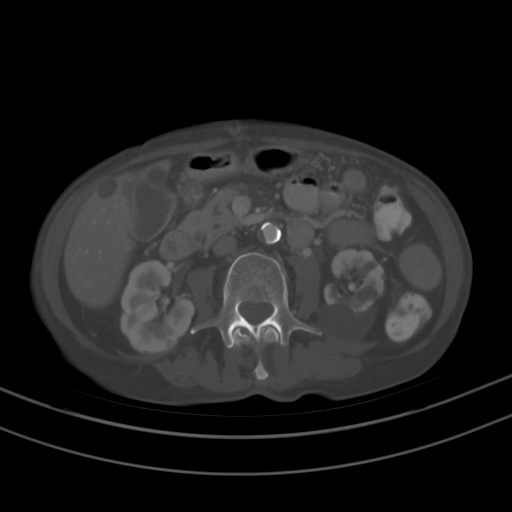
[im 53/72  lung]
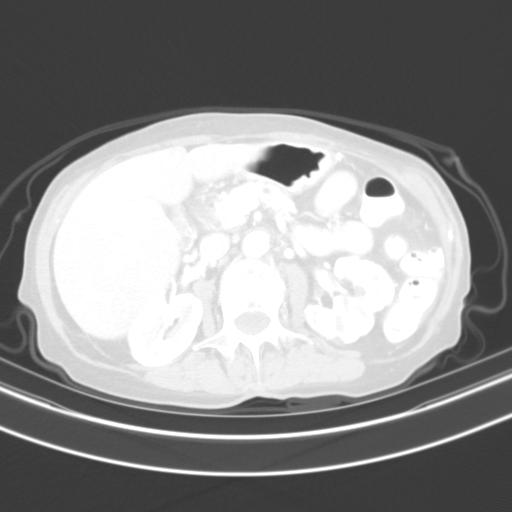
[im 57/72  soft-tissue]
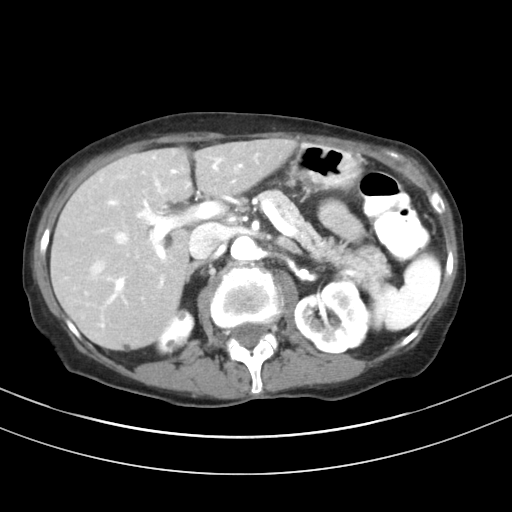
[im 57/72  lung]
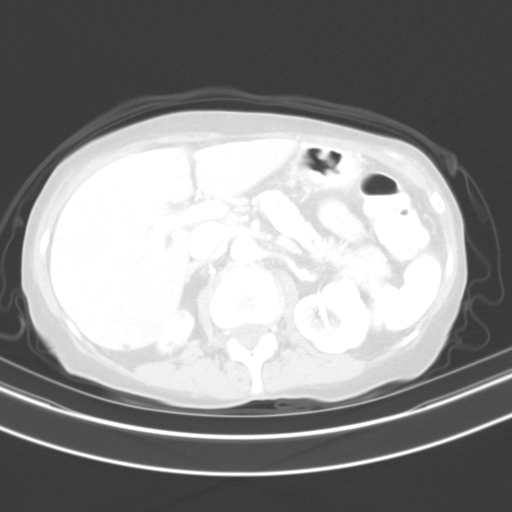
[im 62/72  soft-tissue]
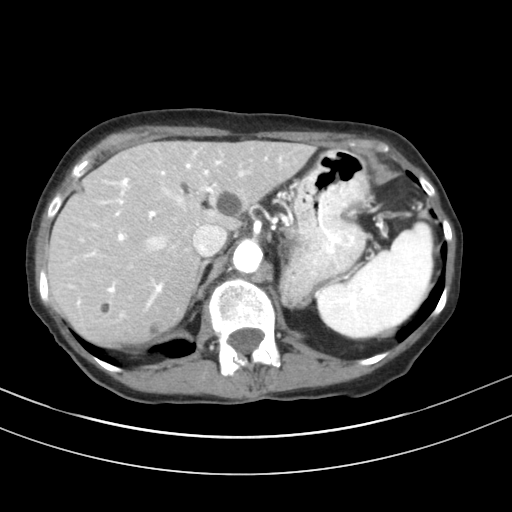
[im 62/72  lung]
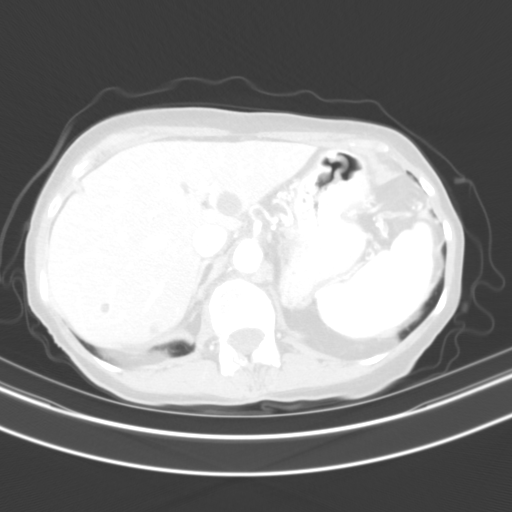
[im 67/72  soft-tissue]
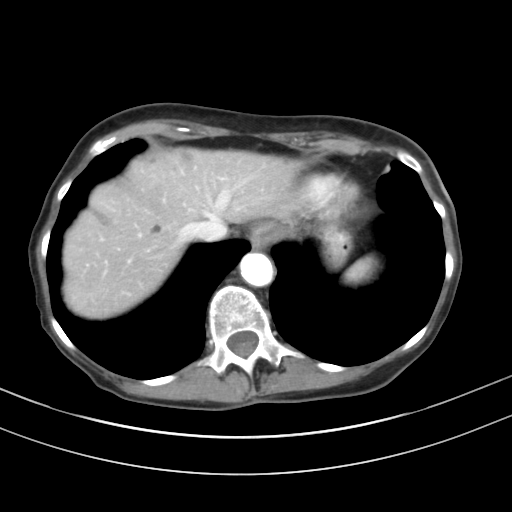
[im 67/72  lung]
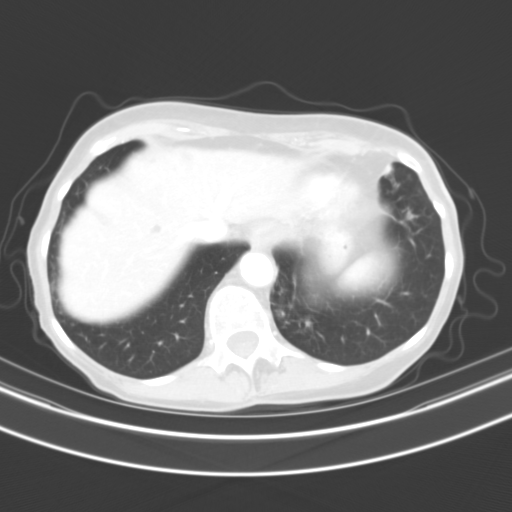

[13 of 32 positions shown; findings below may reference images not displayed]

FINDINGS: Lower chest: Minimal atelectasis and scarring at medial LEFT lower
lobe base.

Hepatobiliary: Multiple hepatic cysts. Mild fatty infiltration of
liver. No additional hepatic lesions or intrahepatic biliary
dilatation.

Pancreas: Normal appearance

Spleen: Normal appearance

Adrenals/Urinary Tract: Minimal thickening of adrenal glands without
discrete mass. Multiple BILATERAL renal cysts, largest posterior mid
LEFT kidney 3.5 x 2.1 cm. No solid or enhancing renal mass
visualized. No hydronephrosis, hydroureter or urinary tract
calcification. Bladder unremarkable.

Stomach/Bowel: Normal appendix. Diverticulosis of sigmoid colon
without evidence of diverticulitis. Questionable wall thickening of
rectum versus artifact from underdistention. Stomach decompressed.
Bowel loops otherwise unremarkable. No bowel dilatation or evidence
of obstruction.

Vascular/Lymphatic: Atherosclerotic calcifications aorta and iliac
arteries. Aorta normal caliber. Small amount of thrombus in distal
abdominal aorta. No adenopathy.

Reproductive: Uterus surgically absent. Normal sized ovaries.
Vaginal cuff unremarkable.

Other: No free air or free fluid. No hernia or acute inflammatory
process.

Musculoskeletal: Bones demineralized. Grade 1 spondylolisthesis
L5-S1 secondary to facet and disc degenerative changes, associated
with pseudo disc formation. Bulging discs L3-L4 and L4-L5.
IMPRESSION: Multiple hepatic and renal cysts.

Sigmoid diverticulosis without evidence of diverticulitis.

Questionable wall thickening of the rectum versus artifact from
underdistention; recommend correlation with digital rectal exam and
proctoscopy.

No other acute intra-abdominal or intrapelvic abnormalities.

Aortic Atherosclerosis (S37P9-NBC.C).

## 2020-08-29 DIAGNOSIS — J441 Chronic obstructive pulmonary disease with (acute) exacerbation: Secondary | ICD-10-CM | POA: Diagnosis not present

## 2020-08-29 DIAGNOSIS — M545 Low back pain, unspecified: Secondary | ICD-10-CM | POA: Diagnosis not present

## 2020-08-29 DIAGNOSIS — S22029D Unspecified fracture of second thoracic vertebra, subsequent encounter for fracture with routine healing: Secondary | ICD-10-CM | POA: Diagnosis not present

## 2020-08-29 DIAGNOSIS — S22019D Unspecified fracture of first thoracic vertebra, subsequent encounter for fracture with routine healing: Secondary | ICD-10-CM | POA: Diagnosis not present

## 2020-08-29 DIAGNOSIS — S2220XD Unspecified fracture of sternum, subsequent encounter for fracture with routine healing: Secondary | ICD-10-CM | POA: Diagnosis not present

## 2020-08-29 DIAGNOSIS — G8929 Other chronic pain: Secondary | ICD-10-CM | POA: Diagnosis not present

## 2020-09-03 DIAGNOSIS — M545 Low back pain, unspecified: Secondary | ICD-10-CM | POA: Diagnosis not present

## 2020-09-03 DIAGNOSIS — J441 Chronic obstructive pulmonary disease with (acute) exacerbation: Secondary | ICD-10-CM | POA: Diagnosis not present

## 2020-09-03 DIAGNOSIS — S22029D Unspecified fracture of second thoracic vertebra, subsequent encounter for fracture with routine healing: Secondary | ICD-10-CM | POA: Diagnosis not present

## 2020-09-03 DIAGNOSIS — S2220XD Unspecified fracture of sternum, subsequent encounter for fracture with routine healing: Secondary | ICD-10-CM | POA: Diagnosis not present

## 2020-09-03 DIAGNOSIS — S22019D Unspecified fracture of first thoracic vertebra, subsequent encounter for fracture with routine healing: Secondary | ICD-10-CM | POA: Diagnosis not present

## 2020-09-03 DIAGNOSIS — G8929 Other chronic pain: Secondary | ICD-10-CM | POA: Diagnosis not present

## 2020-09-05 DIAGNOSIS — Z23 Encounter for immunization: Secondary | ICD-10-CM | POA: Diagnosis not present

## 2020-09-05 DIAGNOSIS — F329 Major depressive disorder, single episode, unspecified: Secondary | ICD-10-CM | POA: Diagnosis not present

## 2020-09-05 DIAGNOSIS — Z95820 Peripheral vascular angioplasty status with implants and grafts: Secondary | ICD-10-CM | POA: Diagnosis not present

## 2020-09-05 DIAGNOSIS — G894 Chronic pain syndrome: Secondary | ICD-10-CM | POA: Diagnosis not present

## 2020-09-05 DIAGNOSIS — I10 Essential (primary) hypertension: Secondary | ICD-10-CM | POA: Diagnosis not present

## 2020-09-05 DIAGNOSIS — I739 Peripheral vascular disease, unspecified: Secondary | ICD-10-CM | POA: Diagnosis not present

## 2020-09-05 DIAGNOSIS — E785 Hyperlipidemia, unspecified: Secondary | ICD-10-CM | POA: Diagnosis not present

## 2020-09-05 DIAGNOSIS — I774 Celiac artery compression syndrome: Secondary | ICD-10-CM | POA: Diagnosis not present

## 2020-09-05 DIAGNOSIS — N1831 Chronic kidney disease, stage 3a: Secondary | ICD-10-CM | POA: Diagnosis not present

## 2020-09-05 DIAGNOSIS — M810 Age-related osteoporosis without current pathological fracture: Secondary | ICD-10-CM | POA: Diagnosis not present

## 2020-09-05 DIAGNOSIS — I701 Atherosclerosis of renal artery: Secondary | ICD-10-CM | POA: Diagnosis not present

## 2020-09-05 DIAGNOSIS — I700 Atherosclerosis of aorta: Secondary | ICD-10-CM | POA: Diagnosis not present

## 2020-09-12 ENCOUNTER — Other Ambulatory Visit: Payer: Self-pay | Admitting: Gastroenterology

## 2020-09-12 DIAGNOSIS — M545 Low back pain, unspecified: Secondary | ICD-10-CM | POA: Diagnosis not present

## 2020-09-12 DIAGNOSIS — Z23 Encounter for immunization: Secondary | ICD-10-CM | POA: Diagnosis not present

## 2020-09-12 DIAGNOSIS — S22019D Unspecified fracture of first thoracic vertebra, subsequent encounter for fracture with routine healing: Secondary | ICD-10-CM | POA: Diagnosis not present

## 2020-09-12 DIAGNOSIS — S2220XD Unspecified fracture of sternum, subsequent encounter for fracture with routine healing: Secondary | ICD-10-CM | POA: Diagnosis not present

## 2020-09-12 DIAGNOSIS — G8929 Other chronic pain: Secondary | ICD-10-CM | POA: Diagnosis not present

## 2020-09-12 DIAGNOSIS — R1013 Epigastric pain: Secondary | ICD-10-CM

## 2020-09-12 DIAGNOSIS — S22029D Unspecified fracture of second thoracic vertebra, subsequent encounter for fracture with routine healing: Secondary | ICD-10-CM | POA: Diagnosis not present

## 2020-09-12 DIAGNOSIS — J441 Chronic obstructive pulmonary disease with (acute) exacerbation: Secondary | ICD-10-CM | POA: Diagnosis not present

## 2020-09-12 NOTE — Telephone Encounter (Signed)
Dr Tarri Glenn- This patient is prescribed omeprazole 40 mg BID by you. However, she is also taking Plavix. I have received a refill request for her omeprazole but get a "major interaction warning" between the plavix and omeprazole due to omeprazole causing ineffectiveness of plavix. It does appear she has been on this medication for many years as prescribed by her previous provider initially. Do you want to continue prescribing omeprazole or change her to pantoprazole/another PPI (I do not see where she has been on any other PPI previously although she did come from another practice).  Please advise.Marland KitchenMarland Kitchen

## 2020-09-12 NOTE — Telephone Encounter (Signed)
Dr Tarri Glenn has approved continuing omeprazole medication as previously prescribed.

## 2020-09-16 ENCOUNTER — Other Ambulatory Visit: Payer: Self-pay

## 2020-09-16 ENCOUNTER — Ambulatory Visit (INDEPENDENT_AMBULATORY_CARE_PROVIDER_SITE_OTHER): Payer: Medicare Other | Admitting: Pulmonary Disease

## 2020-09-16 DIAGNOSIS — J439 Emphysema, unspecified: Secondary | ICD-10-CM | POA: Diagnosis not present

## 2020-09-16 LAB — PULMONARY FUNCTION TEST
DL/VA % pred: 32 %
DL/VA: 1.33 ml/min/mmHg/L
DLCO cor % pred: 27 %
DLCO cor: 4.6 ml/min/mmHg
DLCO unc % pred: 27 %
DLCO unc: 4.6 ml/min/mmHg
FEF 25-75 Post: 0.65 L/sec
FEF 25-75 Pre: 0.45 L/sec
FEF2575-%Change-Post: 44 %
FEF2575-%Pred-Post: 70 %
FEF2575-%Pred-Pre: 48 %
FEV1-%Change-Post: 10 %
FEV1-%Pred-Post: 79 %
FEV1-%Pred-Pre: 72 %
FEV1-Post: 1.15 L
FEV1-Pre: 1.04 L
FEV1FVC-%Change-Post: 1 %
FEV1FVC-%Pred-Pre: 82 %
FEV6-%Change-Post: 9 %
FEV6-%Pred-Post: 101 %
FEV6-%Pred-Pre: 92 %
FEV6-Post: 1.87 L
FEV6-Pre: 1.71 L
FEV6FVC-%Change-Post: 0 %
FEV6FVC-%Pred-Post: 105 %
FEV6FVC-%Pred-Pre: 105 %
FVC-%Change-Post: 9 %
FVC-%Pred-Post: 95 %
FVC-%Pred-Pre: 87 %
FVC-Post: 1.9 L
FVC-Pre: 1.74 L
Post FEV1/FVC ratio: 61 %
Post FEV6/FVC ratio: 99 %
Pre FEV1/FVC ratio: 60 %
Pre FEV6/FVC Ratio: 98 %

## 2020-09-16 NOTE — Progress Notes (Signed)
Spirometry pre and post and Dlco done today. ?

## 2020-09-25 ENCOUNTER — Telehealth: Payer: Self-pay | Admitting: Pulmonary Disease

## 2020-09-25 NOTE — Telephone Encounter (Signed)
Please let patient know she has mild obstructive defect and severe diffusion defect on her pulmonary function tests. These are a result of her pulmonary emphysema. Please place referral to pulmonary rehab if she would like to participate in this program in order to work on improving her exercise tolerance.   Thanks, Freda Jackson, MD [facility] Office: [phone]   See Amion for Pager Details

## 2020-09-26 NOTE — Telephone Encounter (Addendum)
Spoke with pt. She is aware of Dr. August Albino message. Pt is currently dealing with a death in her family so she would not want to do pulmonary rehab at this time. She will call us back when she is ready for this referral. Nothing further was needed at this time.

## 2020-10-03 DIAGNOSIS — Z79891 Long term (current) use of opiate analgesic: Secondary | ICD-10-CM | POA: Diagnosis not present

## 2020-11-06 DIAGNOSIS — J432 Centrilobular emphysema: Secondary | ICD-10-CM | POA: Diagnosis not present

## 2020-11-06 DIAGNOSIS — M5416 Radiculopathy, lumbar region: Secondary | ICD-10-CM | POA: Diagnosis not present

## 2020-11-06 DIAGNOSIS — K589 Irritable bowel syndrome without diarrhea: Secondary | ICD-10-CM | POA: Diagnosis not present

## 2020-11-06 DIAGNOSIS — N1831 Chronic kidney disease, stage 3a: Secondary | ICD-10-CM | POA: Diagnosis not present

## 2020-11-06 DIAGNOSIS — I700 Atherosclerosis of aorta: Secondary | ICD-10-CM | POA: Diagnosis not present

## 2020-11-06 DIAGNOSIS — E871 Hypo-osmolality and hyponatremia: Secondary | ICD-10-CM | POA: Diagnosis not present

## 2020-11-06 DIAGNOSIS — E785 Hyperlipidemia, unspecified: Secondary | ICD-10-CM | POA: Diagnosis not present

## 2020-11-06 DIAGNOSIS — N183 Chronic kidney disease, stage 3 unspecified: Secondary | ICD-10-CM | POA: Diagnosis not present

## 2020-11-06 DIAGNOSIS — I10 Essential (primary) hypertension: Secondary | ICD-10-CM | POA: Diagnosis not present

## 2020-11-06 DIAGNOSIS — R7301 Impaired fasting glucose: Secondary | ICD-10-CM | POA: Diagnosis not present

## 2020-11-06 DIAGNOSIS — I739 Peripheral vascular disease, unspecified: Secondary | ICD-10-CM | POA: Diagnosis not present

## 2020-11-06 DIAGNOSIS — R202 Paresthesia of skin: Secondary | ICD-10-CM | POA: Diagnosis not present

## 2020-11-06 DIAGNOSIS — D649 Anemia, unspecified: Secondary | ICD-10-CM | POA: Diagnosis not present

## 2020-11-06 DIAGNOSIS — G894 Chronic pain syndrome: Secondary | ICD-10-CM | POA: Diagnosis not present

## 2020-11-06 DIAGNOSIS — M810 Age-related osteoporosis without current pathological fracture: Secondary | ICD-10-CM | POA: Diagnosis not present

## 2020-11-12 ENCOUNTER — Encounter: Payer: Self-pay | Admitting: Pulmonary Disease

## 2020-11-12 ENCOUNTER — Ambulatory Visit: Payer: Medicare Other | Admitting: Pulmonary Disease

## 2020-11-12 ENCOUNTER — Ambulatory Visit (INDEPENDENT_AMBULATORY_CARE_PROVIDER_SITE_OTHER): Payer: Medicare Other | Admitting: Pulmonary Disease

## 2020-11-12 ENCOUNTER — Ambulatory Visit (INDEPENDENT_AMBULATORY_CARE_PROVIDER_SITE_OTHER): Payer: Medicare Other

## 2020-11-12 ENCOUNTER — Other Ambulatory Visit: Payer: Self-pay

## 2020-11-12 VITALS — BP 136/66 | HR 64 | Temp 97.9°F | Ht 61.0 in | Wt 88.4 lb

## 2020-11-12 DIAGNOSIS — J439 Emphysema, unspecified: Secondary | ICD-10-CM

## 2020-11-12 DIAGNOSIS — I10 Essential (primary) hypertension: Secondary | ICD-10-CM

## 2020-11-12 DIAGNOSIS — J310 Chronic rhinitis: Secondary | ICD-10-CM

## 2020-11-12 MED ORDER — TRELEGY ELLIPTA 100-62.5-25 MCG/INH IN AEPB: 1.0000 | INHALATION_SPRAY | Freq: Every day | RESPIRATORY_TRACT | 0 refills | Status: DC

## 2020-11-12 MED ORDER — IPRATROPIUM BROMIDE 0.03 % NA SOLN: 2.0000 | Freq: Two times a day (BID) | NASAL | 12 refills | Status: DC

## 2020-11-12 NOTE — Progress Notes (Signed)
Six Minute Walk - 11/12/20 1110      Six Minute Walk   Medications taken before test (dose and time) amlodipine 5mg , lititor 20mg , Plavix 75mg , multi vitamin, Prilosec 40mg , propanolol 120mg -all taken around 0800 per Patient    Supplemental oxygen during test? No    Lap distance in meters  34 meters    Laps Completed 8    Partial lap (in meters) 1 meters    Baseline BP (sitting) 132/70    Baseline Heartrate 60    Baseline Dyspnea (Borg Scale) 0    Baseline Fatigue (Borg Scale) 0    Baseline SPO2 96 %   RA     End of Test Values    BP (sitting) 140/72    Heartrate 76    Dyspnea (Borg Scale) 0.5    Fatigue (Borg Scale) 0    SPO2 97 %   RA     2 Minutes Post Walk Values   BP (sitting) 138/72    Heartrate 64    SPO2 93 %   RA   Stopped or paused before six minutes? Yes    Reason: Patient stopped after lap 5. Patient denied any sob, or pain, but stated her legs felt tired.    Other Symptoms at end of exercise: --   denies     Interpretation   Distance completed 273 meters    Tech Comments: Patient ambulated at a slow steady pace.  Patient did seem anxious at start and during walk.

## 2020-11-12 NOTE — Patient Instructions (Signed)
Check with insurance about coverage for overnight oximetry test  Continue on trelegy ellipta
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
## 2021-01-07 ENCOUNTER — Other Ambulatory Visit: Payer: Self-pay | Admitting: Gastroenterology

## 2021-01-07 DIAGNOSIS — Z961 Presence of intraocular lens: Secondary | ICD-10-CM | POA: Diagnosis not present

## 2021-01-07 DIAGNOSIS — R1013 Epigastric pain: Secondary | ICD-10-CM

## 2021-01-07 DIAGNOSIS — H04123 Dry eye syndrome of bilateral lacrimal glands: Secondary | ICD-10-CM | POA: Diagnosis not present

## 2021-01-07 DIAGNOSIS — H18413 Arcus senilis, bilateral: Secondary | ICD-10-CM | POA: Diagnosis not present

## 2021-01-07 DIAGNOSIS — H353131 Nonexudative age-related macular degeneration, bilateral, early dry stage: Secondary | ICD-10-CM | POA: Diagnosis not present

## 2021-01-21 DIAGNOSIS — H43812 Vitreous degeneration, left eye: Secondary | ICD-10-CM | POA: Diagnosis not present

## 2021-01-21 DIAGNOSIS — H353132 Nonexudative age-related macular degeneration, bilateral, intermediate dry stage: Secondary | ICD-10-CM | POA: Diagnosis not present

## 2021-01-21 DIAGNOSIS — H35033 Hypertensive retinopathy, bilateral: Secondary | ICD-10-CM | POA: Diagnosis not present

## 2021-01-21 DIAGNOSIS — H35373 Puckering of macula, bilateral: Secondary | ICD-10-CM | POA: Diagnosis not present

## 2021-01-30 DIAGNOSIS — M5459 Other low back pain: Secondary | ICD-10-CM | POA: Diagnosis not present

## 2021-01-30 DIAGNOSIS — Z79891 Long term (current) use of opiate analgesic: Secondary | ICD-10-CM | POA: Diagnosis not present

## 2021-01-30 DIAGNOSIS — Z79899 Other long term (current) drug therapy: Secondary | ICD-10-CM | POA: Diagnosis not present

## 2021-01-30 DIAGNOSIS — M5416 Radiculopathy, lumbar region: Secondary | ICD-10-CM | POA: Diagnosis not present

## 2021-01-30 DIAGNOSIS — G894 Chronic pain syndrome: Secondary | ICD-10-CM | POA: Diagnosis not present

## 2021-02-04 DIAGNOSIS — M810 Age-related osteoporosis without current pathological fracture: Secondary | ICD-10-CM | POA: Diagnosis not present

## 2021-02-04 DIAGNOSIS — I739 Peripheral vascular disease, unspecified: Secondary | ICD-10-CM | POA: Diagnosis not present

## 2021-02-04 DIAGNOSIS — J432 Centrilobular emphysema: Secondary | ICD-10-CM | POA: Diagnosis not present

## 2021-02-04 DIAGNOSIS — I10 Essential (primary) hypertension: Secondary | ICD-10-CM | POA: Diagnosis not present

## 2021-02-04 DIAGNOSIS — G894 Chronic pain syndrome: Secondary | ICD-10-CM | POA: Diagnosis not present

## 2021-02-04 DIAGNOSIS — N1831 Chronic kidney disease, stage 3a: Secondary | ICD-10-CM | POA: Diagnosis not present

## 2021-02-04 DIAGNOSIS — R7301 Impaired fasting glucose: Secondary | ICD-10-CM | POA: Diagnosis not present

## 2021-02-04 DIAGNOSIS — I701 Atherosclerosis of renal artery: Secondary | ICD-10-CM | POA: Diagnosis not present

## 2021-02-04 DIAGNOSIS — I700 Atherosclerosis of aorta: Secondary | ICD-10-CM | POA: Diagnosis not present

## 2021-02-04 DIAGNOSIS — M5416 Radiculopathy, lumbar region: Secondary | ICD-10-CM | POA: Diagnosis not present

## 2021-02-10 DIAGNOSIS — L570 Actinic keratosis: Secondary | ICD-10-CM | POA: Diagnosis not present

## 2021-02-10 DIAGNOSIS — Z85828 Personal history of other malignant neoplasm of skin: Secondary | ICD-10-CM | POA: Diagnosis not present

## 2021-02-10 DIAGNOSIS — D1801 Hemangioma of skin and subcutaneous tissue: Secondary | ICD-10-CM | POA: Diagnosis not present

## 2021-02-10 DIAGNOSIS — L814 Other melanin hyperpigmentation: Secondary | ICD-10-CM | POA: Diagnosis not present

## 2021-02-10 DIAGNOSIS — L821 Other seborrheic keratosis: Secondary | ICD-10-CM | POA: Diagnosis not present

## 2021-02-10 DIAGNOSIS — L918 Other hypertrophic disorders of the skin: Secondary | ICD-10-CM | POA: Diagnosis not present

## 2021-02-11 DIAGNOSIS — Z79891 Long term (current) use of opiate analgesic: Secondary | ICD-10-CM | POA: Diagnosis not present

## 2021-02-11 DIAGNOSIS — Z79899 Other long term (current) drug therapy: Secondary | ICD-10-CM | POA: Diagnosis not present

## 2021-02-18 DIAGNOSIS — H353112 Nonexudative age-related macular degeneration, right eye, intermediate dry stage: Secondary | ICD-10-CM | POA: Diagnosis not present

## 2021-02-18 DIAGNOSIS — H35373 Puckering of macula, bilateral: Secondary | ICD-10-CM | POA: Diagnosis not present

## 2021-02-18 DIAGNOSIS — H353221 Exudative age-related macular degeneration, left eye, with active choroidal neovascularization: Secondary | ICD-10-CM | POA: Diagnosis not present

## 2021-02-24 DIAGNOSIS — H353221 Exudative age-related macular degeneration, left eye, with active choroidal neovascularization: Secondary | ICD-10-CM | POA: Diagnosis not present

## 2021-03-04 ENCOUNTER — Other Ambulatory Visit: Payer: Self-pay | Admitting: Pulmonary Disease

## 2021-03-24 DIAGNOSIS — H353112 Nonexudative age-related macular degeneration, right eye, intermediate dry stage: Secondary | ICD-10-CM | POA: Diagnosis not present

## 2021-03-24 DIAGNOSIS — H353221 Exudative age-related macular degeneration, left eye, with active choroidal neovascularization: Secondary | ICD-10-CM | POA: Diagnosis not present

## 2021-03-25 ENCOUNTER — Ambulatory Visit (INDEPENDENT_AMBULATORY_CARE_PROVIDER_SITE_OTHER): Payer: Medicare Other | Admitting: Pulmonary Disease

## 2021-03-25 ENCOUNTER — Other Ambulatory Visit: Payer: Self-pay

## 2021-03-25 ENCOUNTER — Encounter: Payer: Self-pay | Admitting: Pulmonary Disease

## 2021-03-25 VITALS — BP 114/72 | HR 63 | Temp 97.4°F | Ht 61.0 in | Wt 90.2 lb

## 2021-03-25 DIAGNOSIS — J439 Emphysema, unspecified: Secondary | ICD-10-CM

## 2021-03-25 DIAGNOSIS — J310 Chronic rhinitis: Secondary | ICD-10-CM | POA: Diagnosis not present

## 2021-03-25 MED ORDER — TRELEGY ELLIPTA 100-62.5-25 MCG/INH IN AEPB: 1.0000 | INHALATION_SPRAY | Freq: Every day | RESPIRATORY_TRACT | 0 refills | Status: DC

## 2021-03-25 NOTE — Patient Instructions (Addendum)
Continue the trelegy ellipta daily  Continue to use albuterol inhaler as needed 1-2 puffs every 4-6 hours   Continue ipratropium nasal spray 2 sprays twice daily as needed.

## 2021-03-25 NOTE — Progress Notes (Signed)
Synopsis: Return visit for emphysema  Subjective:   PATIENT ID: Jessica [name] GENDER: female DOB: 1932-06-07, MRN: [id number]   HPI  Chief Complaint  Patient presents with  . Follow-up    4 mo f/u for emphysema. States she is still using Trelegy once daily. Breathing has been stable since last visit.    Jessica [name] is an 87 year old woman, former smoker with peripheral vascular disease, hypertension and emphysema who returns to pulmonary clinic for follow up  She continues on Trelegy Ellipta daily and as needed albuterol. She is using albuterol once every 3 to 4 days. She was started on ipratropium nasal spray at last visit with significant improvement in her rhinitis.   Past Medical History:  Diagnosis Date  . Anemia   . Arthritis    "thumbs" (06/27/2015)  . Bilateral renal artery stenosis ([place])   . Cataract   . Chronic bronchitis ([place])   . Chronic lower back pain   . GERD (gastroesophageal reflux disease)   . Headache    "probably weekly" (06/27/2015)  . Hypertension   . Hypertriglyceridemia   . Migraine    "maybe monthly" (06/27/2015)     Family History  Problem Relation Age of Onset  . Hypertension Brother   . Hyperlipidemia Brother   . Parkinsonism Father   . Stroke Maternal Grandfather   . Colon cancer Neg Hx   . Colon polyps Neg Hx   . Stomach cancer Neg Hx      Social History   Socioeconomic History  . Marital status: Widowed    Spouse name: Not on file  . Number of children: 1  . Years of education: Not on file  . Highest education level: Not on file  Occupational History  . Occupation: retired  Tobacco Use  . Smoking status: Former Smoker    Packs/day: 0.50    Years: 20.00    Pack years: 10.00    Types: Cigarettes  . Smokeless tobacco: Never Used  . Tobacco comment: 'quit smoking in the 1990's?"  Vaping Use  . Vaping Use: Never used  Substance and Sexual Activity  . Alcohol use: Yes    Alcohol/week: 0.0 - 1.0 standard drinks    Comment:  rarely  . Drug use: No  . Sexual activity: Yes    Partners: Male    Birth control/protection: Surgical, Post-menopausal    Comment: hysterectomy  Other Topics Concern  . Not on file  Social History Narrative  . Not on file   Social Determinants of Health   Financial Resource Strain: Not on file  Food Insecurity: Not on file  Transportation Needs: Not on file  Physical Activity: Not on file  Stress: Not on file  Social Connections: Not on file  Intimate Partner Violence: Not on file     No Known Allergies   Outpatient Medications Prior to Visit  Medication Sig Dispense Refill  . amLODipine (NORVASC) 5 MG tablet Take 5 mg by mouth daily.    Marland Kitchen atorvastatin (LIPITOR) 20 MG tablet Take 1 tablet (20 mg total) by mouth daily. 30 tablet 11  . clopidogrel (PLAVIX) 75 MG tablet Take 1 tablet (75 mg total) by mouth daily. 30 tablet 0  . gabapentin (NEURONTIN) 100 MG capsule Take 100 mg by mouth 2 (two) times daily as needed.    Marland Kitchen ipratropium (ATROVENT) 0.03 % nasal spray Place 2 sprays into both nostrils every 12 (twelve) hours. 30 mL 12  . losartan (COZAAR) 100 MG tablet  Take 100 mg by mouth daily.     . mirtazapine (REMERON) 15 MG tablet Take 15 mg by mouth at bedtime.    . montelukast (SINGULAIR) 10 MG tablet Take 10 mg by mouth at bedtime.   6  . Multiple Vitamins-Minerals (MULTIVITAMIN PO) Take 1 tablet by mouth daily.     Marland Kitchen omeprazole (PRILOSEC) 40 MG capsule TAKE 1 CAPSULE (40 MG TOTAL) BY MOUTH IN THE MORNING AND AT BEDTIME. 180 capsule 1  . propranolol ER (INDERAL LA) 120 MG 24 hr capsule Take 120 mg by mouth daily.  3  . TRELEGY ELLIPTA 100-62.5-25 MCG/INH AEPB TAKE 1 PUFF BY MOUTH EVERY DAY 60 each 6  . Fluticasone-Umeclidin-Vilant (TRELEGY ELLIPTA) 100-62.5-25 MCG/INH AEPB Inhale 1 puff into the lungs daily. 28 each 0  . urea (CARMOL) 40 % CREA Apply 1 application topically daily as needed for rash.     No facility-administered medications prior to visit.    Review of  Systems  Constitutional: Negative for chills, fever, malaise/fatigue and weight loss.  HENT:       Rhinitis and post nasal drip  Eyes: Negative.   Respiratory: Negative for cough, hemoptysis, sputum production, shortness of breath and wheezing.   Cardiovascular: Negative for chest pain, palpitations, orthopnea, claudication and leg swelling.  Gastrointestinal: Negative for abdominal pain, heartburn, nausea and vomiting.  Genitourinary: Negative.   Musculoskeletal: Negative.   Neurological: Negative for dizziness and headaches.  Psychiatric/Behavioral: Negative.    Objective:   Vitals:   03/25/21 1214  BP: 114/72  Pulse: 63  Temp: (!) 97.4 F (36.3 C)  TempSrc: Temporal  SpO2: 94%  Weight: 90 lb 3.2 oz (40.9 kg)  Height: 5\' 1"  (1.549 m)     Physical Exam Constitutional:      Comments: thin  HENT:     Head: Normocephalic and atraumatic.  Cardiovascular:     Rate and Rhythm: Normal rate and regular rhythm.     Pulses: Normal pulses.     Heart sounds: Normal heart sounds.  Pulmonary:     Effort: Pulmonary effort is normal.     Breath sounds: Normal breath sounds.  Abdominal:     General: Bowel sounds are normal.     Palpations: Abdomen is soft.  Musculoskeletal:     Right lower leg: No edema.     Left lower leg: No edema.  Skin:    General: Skin is warm and dry.     Capillary Refill: Capillary refill takes less than 2 seconds.  Neurological:     General: No focal deficit present.     Mental Status: She is alert.  Psychiatric:        Mood and Affect: Mood normal.        Behavior: Behavior normal.        Thought Content: Thought content normal.        Judgment: Judgment normal.     CBC    Component Value Date/Time   WBC 6.4 07/16/2020 0235   RBC 3.95 07/16/2020 0235   HGB 9.7 (L) 07/16/2020 0235   HCT 33.4 (L) 07/16/2020 0235   PLT 296 07/16/2020 0235   MCV 84.6 07/16/2020 0235   MCH 24.6 (L) 07/16/2020 0235   MCHC 29.0 (L) 07/16/2020 0235   RDW 14.6  07/16/2020 0235   LYMPHSABS 2.3 06/27/2015 0025   MONOABS 0.6 06/27/2015 0025   EOSABS 0.3 06/27/2015 0025   BASOSABS 0.1 06/27/2015 0025     Chest imaging: CT Chest 07/15/20 1.  No CT evidence for acute intrathoracic abnormality. Negative for pneumothorax. 2. Acute angular fracture deformity involving the midportion of the sternal body with mild surrounding edema and hematoma. 3. Suspected acute mild superior endplate deformity at T1, T2 and T3 with possible vertical fracture lucency through the anterior vertebral bodies at T1 and T2. 4. Emphysematous disease  PFT: PFT Results Latest Ref Rng & Units 09/16/2020  FVC-Pre L 1.74  FVC-Predicted Pre % 87  FVC-Post L 1.90  FVC-Predicted Post % 95  Pre FEV1/FVC % % 60  Post FEV1/FCV % % 61  FEV1-Pre L 1.04  FEV1-Predicted Pre % 72  FEV1-Post L 1.15  DLCO uncorrected ml/min/mmHg 4.60  DLCO UNC% % 27  DLCO corrected ml/min/mmHg 4.60  DLCO COR %Predicted % 27  DLVA Predicted % 32   Echo: 06/28/15 - Left ventricle: The cavity size was normal. Wall thickness was  increased in a pattern of moderate LVH. Systolic function was  vigorous. The estimated ejection fraction was in the range of 65%  to 70%. Wall motion was normal; there were no regional wall  motion abnormalities. Doppler parameters are consistent with  abnormal left ventricular relaxation (grade 1 diastolic  dysfunction). Doppler parameters are consistent with high  ventricular filling pressure.    Assessment & Plan:   Pulmonary emphysema, unspecified emphysema type ([place])  Rhinitis, unspecified type  Discussion: Jessica [name] is an 87 year old woman, former smoker with emphysema, peripheral vascular disease and hypertension who returns to pulmonary clinic for follow up.  She has mild obstructive defect and severe diffusion defect on pulmonary function testing. She is to continue on trelegy ellipta daily as she has experienced clinical improvement in her  dyspnea and cough symptoms.   She is to continue as needed ipratropium nasal spray for chronic rhinits.   Follow up in 6 months  Freda Jackson, MD [facility] Office: [phone]    Current Outpatient Medications:  .  amLODipine (NORVASC) 5 MG tablet, Take 5 mg by mouth daily., Disp: , Rfl:  .  atorvastatin (LIPITOR) 20 MG tablet, Take 1 tablet (20 mg total) by mouth daily., Disp: 30 tablet, Rfl: 11 .  clopidogrel (PLAVIX) 75 MG tablet, Take 1 tablet (75 mg total) by mouth daily., Disp: 30 tablet, Rfl: 0 .  Fluticasone-Umeclidin-Vilant (TRELEGY ELLIPTA) 100-62.5-25 MCG/INH AEPB, Inhale 1 puff into the lungs daily., Disp: 2 each, Rfl: 0 .  gabapentin (NEURONTIN) 100 MG capsule, Take 100 mg by mouth 2 (two) times daily as needed., Disp: , Rfl:  .  ipratropium (ATROVENT) 0.03 % nasal spray, Place 2 sprays into both nostrils every 12 (twelve) hours., Disp: 30 mL, Rfl: 12 .  losartan (COZAAR) 100 MG tablet, Take 100 mg by mouth daily. , Disp: , Rfl:  .  mirtazapine (REMERON) 15 MG tablet, Take 15 mg by mouth at bedtime., Disp: , Rfl:  .  montelukast (SINGULAIR) 10 MG tablet, Take 10 mg by mouth at bedtime. , Disp: , Rfl: 6 .  Multiple Vitamins-Minerals (MULTIVITAMIN PO), Take 1 tablet by mouth daily. , Disp: , Rfl:  .  omeprazole (PRILOSEC) 40 MG capsule, TAKE 1 CAPSULE (40 MG TOTAL) BY MOUTH IN THE MORNING AND AT BEDTIME., Disp: 180 capsule, Rfl: 1 .  propranolol ER (INDERAL LA) 120 MG 24 hr capsule, Take 120 mg by mouth daily., Disp: , Rfl: 3 .  TRELEGY ELLIPTA 100-62.5-25 MCG/INH AEPB, TAKE 1 PUFF BY MOUTH EVERY DAY, Disp: 60 each, Rfl: 6

## 2021-04-03 ENCOUNTER — Encounter: Payer: Self-pay | Admitting: Pulmonary Disease

## 2021-04-21 DIAGNOSIS — H353221 Exudative age-related macular degeneration, left eye, with active choroidal neovascularization: Secondary | ICD-10-CM | POA: Diagnosis not present

## 2021-04-21 DIAGNOSIS — H353112 Nonexudative age-related macular degeneration, right eye, intermediate dry stage: Secondary | ICD-10-CM | POA: Diagnosis not present

## 2021-04-21 DIAGNOSIS — H35373 Puckering of macula, bilateral: Secondary | ICD-10-CM | POA: Diagnosis not present

## 2021-04-22 ENCOUNTER — Ambulatory Visit: Payer: Medicare Other | Admitting: Vascular Surgery

## 2021-04-22 ENCOUNTER — Inpatient Hospital Stay (HOSPITAL_COMMUNITY): Admission: RE | Admit: 2021-04-22 | Payer: Medicare Other | Source: Ambulatory Visit

## 2021-05-06 ENCOUNTER — Other Ambulatory Visit: Payer: Self-pay

## 2021-05-06 ENCOUNTER — Encounter: Payer: Self-pay | Admitting: Vascular Surgery

## 2021-05-06 ENCOUNTER — Ambulatory Visit (HOSPITAL_COMMUNITY)
Admission: RE | Admit: 2021-05-06 | Discharge: 2021-05-06 | Disposition: A | Discharge status: Home / Self Care | Payer: Medicare Other | Source: Ambulatory Visit | Attending: Vascular Surgery | Admitting: Vascular Surgery

## 2021-05-06 ENCOUNTER — Ambulatory Visit (INDEPENDENT_AMBULATORY_CARE_PROVIDER_SITE_OTHER): Payer: Medicare Other | Admitting: Vascular Surgery

## 2021-05-06 VITALS — BP 132/52 | HR 59 | Temp 97.4°F | Resp 14 | Ht 62.0 in | Wt 89.0 lb

## 2021-05-06 DIAGNOSIS — K551 Chronic vascular disorders of intestine: Secondary | ICD-10-CM | POA: Insufficient documentation

## 2021-05-06 NOTE — Progress Notes (Signed)
Patient name: Jessica [name] MRN: [id number] DOB: 25-Aug-1932 Sex: female  REASON FOR VISIT: 9 month f/u for celiac artery stent  HPI: Jessica [name] is a 87 y.o. female who presents for 9 month follow-up for surveillance of celiac artery stent.  She previously underwent celiac artery stent on 05/15/2020 for 90% stenosis in the celiac artery and concern for underlying epigastric pain and possible mesenteric ischemia.  Her SMA was widely patent after evaluation with mesenteric arteriogram and SMA intervention was not performed.  Her transfemoral access was complicated by closure complication in the right groin after mynx deployment requiring right femoral thromboembolectomy and bovine patch on the same day as her celiac stent on 05/15/2020.  Initial 1 month follow-up she had a hematoma versus lymphocele in her right groin.  No evidence of pseudoaneurysm and we previously duplexed her here in the office.  On follow-up today she states she is doing well.  She is having no abdominal pain or postprandial pain.  Feels her weight has been stable.  She is taking Plavix still.  Does not smoke.   Past Medical History:  Diagnosis Date  . Anemia   . Arthritis    "thumbs" (06/27/2015)  . Bilateral renal artery stenosis ([place])   . Cataract   . Chronic bronchitis ([place])   . Chronic lower back pain   . GERD (gastroesophageal reflux disease)   . Headache    "probably weekly" (06/27/2015)  . Hypertension   . Hypertriglyceridemia   . Migraine    "maybe monthly" (06/27/2015)    Past Surgical History:  Procedure Laterality Date  . BALLOON DILATION  1980's?   "for renal stenosis"  . CATARACT EXTRACTION, BILATERAL    . ENDARTERECTOMY FEMORAL Right 05/15/2020   Procedure: Right Common Femoral Endarterectomy;  Surgeon: Marty Heck, MD;  Location: [place];  Service: Vascular;  Laterality: Right;  . PATCH ANGIOPLASTY Right 05/15/2020   Procedure: Common Femoral Artery Patch Angioplasty using XenoSure Bovine  Pericardial Patch;  Surgeon: Marty Heck, MD;  Location: [place] [facility];  Service: Vascular;  Laterality: Right;  . PERIPHERAL VASCULAR INTERVENTION  05/15/2020   Procedure: PERIPHERAL VASCULAR INTERVENTION;  Surgeon: Marty Heck, MD;  Location: [facility];  Service: Cardiovascular;;  Celiac  . RECONSTRUCTION OF EYELID Bilateral 01/2016   Upper and lower eyelid  . RENAL ANGIOPLASTY    . TOE SURGERY Bilateral    "straightened big toe"  . TONSILLECTOMY  ~ 1941  . VAGINAL HYSTERECTOMY  1973  . VISCERAL ANGIOGRAPHY N/A 05/15/2020   Procedure: MESENTERIC  ANGIOGRAPHY;  Surgeon: Marty Heck, MD;  Location: [facility];  Service: Cardiovascular;  Laterality: N/A;    Family History  Problem Relation Age of Onset  . Hypertension Brother   . Hyperlipidemia Brother   . Parkinsonism Father   . Stroke Maternal Grandfather   . Colon cancer Neg Hx   . Colon polyps Neg Hx   . Stomach cancer Neg Hx     SOCIAL HISTORY: Social History   Tobacco Use  . Smoking status: Former Smoker    Packs/day: 0.50    Years: 20.00    Pack years: 10.00    Types: Cigarettes  . Smokeless tobacco: Never Used  . Tobacco comment: 'quit smoking in the 1990's?"  Substance Use Topics  . Alcohol use: Yes    Alcohol/week: 0.0 - 1.0 standard drinks    Comment: rarely    No Known Allergies  Current Outpatient Medications  Medication Sig Dispense Refill  . amLODipine (NORVASC) 5 MG tablet Take 5 mg by mouth daily.    Marland Kitchen atorvastatin (LIPITOR) 20 MG tablet Take 1 tablet (20 mg total) by mouth daily. 30 tablet 11  . clopidogrel (PLAVIX) 75 MG tablet Take 1 tablet (75 mg total) by mouth daily. 30 tablet 0  . Fluticasone-Umeclidin-Vilant (TRELEGY ELLIPTA) 100-62.5-25 MCG/INH AEPB Inhale 1 puff into the lungs daily. 2 each 0  . gabapentin (NEURONTIN) 100 MG capsule Take 100 mg by mouth 2 (two) times daily as needed.    Marland Kitchen ipratropium (ATROVENT) 0.03 % nasal spray Place 2 sprays into both  nostrils every 12 (twelve) hours. 30 mL 12  . losartan (COZAAR) 100 MG tablet Take 100 mg by mouth daily.     . mirtazapine (REMERON) 15 MG tablet Take 15 mg by mouth at bedtime.    . montelukast (SINGULAIR) 10 MG tablet Take 10 mg by mouth at bedtime.   6  . Multiple Vitamins-Minerals (MULTIVITAMIN PO) Take 1 tablet by mouth daily.     Marland Kitchen omeprazole (PRILOSEC) 40 MG capsule TAKE 1 CAPSULE (40 MG TOTAL) BY MOUTH IN THE MORNING AND AT BEDTIME. 180 capsule 1  . propranolol ER (INDERAL LA) 120 MG 24 hr capsule Take 120 mg by mouth daily.  3  . TRELEGY ELLIPTA 100-62.5-25 MCG/INH AEPB TAKE 1 PUFF BY MOUTH EVERY DAY 60 each 6   No current facility-administered medications for this visit.    REVIEW OF SYSTEMS:  [X]  denotes positive finding, [ ]  denotes negative finding Cardiac  Comments:  Chest pain or chest pressure:    Shortness of breath upon exertion:    Short of breath when lying flat:    Irregular heart rhythm:        Vascular    Pain in calf, thigh, or hip brought on by ambulation:    Pain in feet at night that wakes you up from your sleep:     Blood clot in your veins:    Leg swelling:         Pulmonary    Oxygen at home:    Productive cough:     Wheezing:         Neurologic    Sudden weakness in arms or legs:     Sudden numbness in arms or legs:     Sudden onset of difficulty speaking or slurred speech:    Temporary loss of vision in one eye:     Problems with dizziness:         Gastrointestinal    Blood in stool:     Vomited blood:         Genitourinary    Burning when urinating:     Blood in urine:        Psychiatric    Major depression:         Hematologic    Bleeding problems:    Problems with blood clotting too easily:        Skin    Rashes or ulcers:        Constitutional    Fever or chills:      PHYSICAL EXAM: Vitals:   05/06/21 0840  BP: (!) 132/52  Pulse: (!) 59  Resp: 14  Temp: (!) 97.4 F (36.3 C)  TempSrc: Temporal  SpO2: 94%   Weight: 89 lb (40.4 kg)  Height: 5\' 2"  (1.575 m)    GENERAL: The patient is a well-nourished female, in no acute distress.  The vital signs are documented above. CARDIAC: There is a regular rate and rhythm.  VASCULAR:  Bilateral femoral pulses palpable   DATA:   Mesenteric duplex today suggest recurrent high-grade stenosis in the proximal celiac artery where the stent is and in addition she has had elevated velocities in the SMA also suggesting 50 to 99% stenosis but this was not correlated on angiogram last year.  Assessment/Plan:  87 yo F presents for 81-month follow-up of her celiac artery stent.  She previously had celiac artery stent on 05/15/2020 for 90% stenosis in the celiac artery and concern for underlying epigastric pain and possible mesenteric ischemia.  Her SMA was widely patent after evaluation with mesenteric arteriogram and SMA intervention was not required.  Her transfemoral access was complicated by closure complication in the right groin after mynx deployment requiring right femoral thromboembolectomy and bovine patch on the same day as her celiac stent on 05/15/2020.    Discussed based on duplex today, I have concern that she has a recurrent high grade stenosis in the celiac artery stent.  I have recommended mesenteric angiogram to verify the stenosis and discussed we may try and reintervene on the stent if this is confirmed.  Discussed we would use her left common femoral artery given previous groin issues on the right.  She does feel that her abdominal pain got much better after celiac stenting last year.  She is going to discuss with her daughter and call back to schedule this is certainly not urgent.  She will continue Plavix from my standpoint.   Marty Heck, MD [facility] Office: [phone]

## 2021-05-07 ENCOUNTER — Other Ambulatory Visit: Payer: Self-pay | Admitting: Physician Assistant

## 2021-05-07 ENCOUNTER — Other Ambulatory Visit: Payer: Self-pay

## 2021-05-08 DIAGNOSIS — Z1231 Encounter for screening mammogram for malignant neoplasm of breast: Secondary | ICD-10-CM | POA: Diagnosis not present

## 2021-05-15 ENCOUNTER — Encounter (HOSPITAL_COMMUNITY)
Admission: RE | Disposition: A | Discharge status: Home / Self Care | Payer: Self-pay | Source: Home / Self Care | Attending: Vascular Surgery

## 2021-05-15 ENCOUNTER — Other Ambulatory Visit: Payer: Self-pay

## 2021-05-15 ENCOUNTER — Ambulatory Visit (HOSPITAL_COMMUNITY)
Admission: RE | Admit: 2021-05-15 | Discharge: 2021-05-15 | Disposition: A | Discharge status: Home / Self Care | Payer: Medicare Other | Attending: Vascular Surgery | Admitting: Vascular Surgery

## 2021-05-15 DIAGNOSIS — I771 Stricture of artery: Secondary | ICD-10-CM | POA: Insufficient documentation

## 2021-05-15 DIAGNOSIS — Z7902 Long term (current) use of antithrombotics/antiplatelets: Secondary | ICD-10-CM | POA: Diagnosis not present

## 2021-05-15 DIAGNOSIS — Z7951 Long term (current) use of inhaled steroids: Secondary | ICD-10-CM | POA: Insufficient documentation

## 2021-05-15 DIAGNOSIS — Z79899 Other long term (current) drug therapy: Secondary | ICD-10-CM | POA: Insufficient documentation

## 2021-05-15 DIAGNOSIS — Y832 Surgical operation with anastomosis, bypass or graft as the cause of abnormal reaction of the patient, or of later complication, without mention of misadventure at the time of the procedure: Secondary | ICD-10-CM | POA: Insufficient documentation

## 2021-05-15 DIAGNOSIS — K551 Chronic vascular disorders of intestine: Secondary | ICD-10-CM | POA: Insufficient documentation

## 2021-05-15 DIAGNOSIS — Z87891 Personal history of nicotine dependence: Secondary | ICD-10-CM | POA: Insufficient documentation

## 2021-05-15 DIAGNOSIS — T82858A Stenosis of vascular prosthetic devices, implants and grafts, initial encounter: Secondary | ICD-10-CM | POA: Diagnosis not present

## 2021-05-15 DIAGNOSIS — Y712 Prosthetic and other implants, materials and accessory cardiovascular devices associated with adverse incidents: Secondary | ICD-10-CM | POA: Diagnosis not present

## 2021-05-15 HISTORY — PX: PERIPHERAL VASCULAR BALLOON ANGIOPLASTY: CATH118281

## 2021-05-15 HISTORY — PX: VISCERAL ANGIOGRAPHY: CATH118276

## 2021-05-15 LAB — POCT I-STAT, CHEM 8
BUN: 25 mg/dL — ABNORMAL HIGH (ref 8–23)
Calcium, Ion: 1.15 mmol/L (ref 1.15–1.40)
Chloride: 105 mmol/L (ref 98–111)
Creatinine, Ser: 0.7 mg/dL (ref 0.44–1.00)
Glucose, Bld: 108 mg/dL — ABNORMAL HIGH (ref 70–99)
HCT: 42 % (ref 36.0–46.0)
Hemoglobin: 14.3 g/dL (ref 12.0–15.0)
Potassium: 4.3 mmol/L (ref 3.5–5.1)
Sodium: 140 mmol/L (ref 135–145)
TCO2: 25 mmol/L (ref 22–32)

## 2021-05-15 SURGERY — VISCERAL ANGIOGRAPHY
Anesthesia: LOCAL

## 2021-05-15 MED ORDER — SODIUM CHLORIDE 0.9 % IV SOLN: 250.0000 mL | INTRAVENOUS | Status: DC | PRN

## 2021-05-15 MED ORDER — HEPARIN SODIUM (PORCINE) 1000 UNIT/ML IJ SOLN
INTRAMUSCULAR | Status: DC | PRN
  Administered 2021-05-15: 4000 [IU] via INTRAVENOUS

## 2021-05-15 MED ORDER — SODIUM CHLORIDE 0.9% FLUSH: 3.0000 mL | INTRAVENOUS | Status: DC | PRN

## 2021-05-15 MED ORDER — LIDOCAINE HCL (PF) 1 % IJ SOLN
INTRAMUSCULAR | Status: AC
  Filled 2021-05-15: qty 30

## 2021-05-15 MED ORDER — ASPIRIN EC 81 MG PO TBEC: 81.0000 mg | DELAYED_RELEASE_TABLET | Freq: Every day | ORAL | 2 refills | Status: AC

## 2021-05-15 MED ORDER — SODIUM CHLORIDE 0.9 % WEIGHT BASED INFUSION: 1.0000 mL/kg/h | INTRAVENOUS | Status: DC

## 2021-05-15 MED ORDER — LIDOCAINE HCL (PF) 1 % IJ SOLN
INTRAMUSCULAR | Status: DC | PRN
  Administered 2021-05-15: 15 mL via INTRADERMAL

## 2021-05-15 MED ORDER — FENTANYL CITRATE (PF) 100 MCG/2ML IJ SOLN
INTRAMUSCULAR | Status: AC
  Filled 2021-05-15: qty 2

## 2021-05-15 MED ORDER — MIDAZOLAM HCL 2 MG/2ML IJ SOLN
INTRAMUSCULAR | Status: AC
  Filled 2021-05-15: qty 2

## 2021-05-15 MED ORDER — HYDRALAZINE HCL 20 MG/ML IJ SOLN: 5.0000 mg | INTRAMUSCULAR | Status: DC | PRN

## 2021-05-15 MED ORDER — HEPARIN (PORCINE) IN NACL 1000-0.9 UT/500ML-% IV SOLN
INTRAVENOUS | Status: AC
  Filled 2021-05-15: qty 1000

## 2021-05-15 MED ORDER — HEPARIN SODIUM (PORCINE) 1000 UNIT/ML IJ SOLN
INTRAMUSCULAR | Status: AC
  Filled 2021-05-15: qty 1

## 2021-05-15 MED ORDER — MIDAZOLAM HCL 2 MG/2ML IJ SOLN
INTRAMUSCULAR | Status: DC | PRN
  Administered 2021-05-15: 1 mg via INTRAVENOUS

## 2021-05-15 MED ORDER — FENTANYL CITRATE (PF) 100 MCG/2ML IJ SOLN
INTRAMUSCULAR | Status: DC | PRN
  Administered 2021-05-15: 50 ug via INTRAVENOUS

## 2021-05-15 MED ORDER — LABETALOL HCL 5 MG/ML IV SOLN: 10.0000 mg | INTRAVENOUS | Status: DC | PRN

## 2021-05-15 MED ORDER — HEPARIN (PORCINE) IN NACL 1000-0.9 UT/500ML-% IV SOLN
INTRAVENOUS | Status: DC | PRN
  Administered 2021-05-15 (×2): 500 mL

## 2021-05-15 MED ORDER — ACETAMINOPHEN 325 MG PO TABS: 650.0000 mg | ORAL_TABLET | ORAL | Status: DC | PRN

## 2021-05-15 MED ORDER — SODIUM CHLORIDE 0.9 % IV SOLN: INTRAVENOUS | Status: DC

## 2021-05-15 MED ORDER — SODIUM CHLORIDE 0.9% FLUSH: 3.0000 mL | Freq: Two times a day (BID) | INTRAVENOUS | Status: DC

## 2021-05-15 MED ORDER — ONDANSETRON HCL 4 MG/2ML IJ SOLN: 4.0000 mg | Freq: Four times a day (QID) | INTRAMUSCULAR | Status: DC | PRN

## 2021-05-15 SURGICAL SUPPLY — 18 items
BALLN VIATRAC 5X15X135 (BALLOONS) ×3
BALLOON VIATRAC 5X15X135 (BALLOONS) IMPLANT
CATH OMNI FLUSH 5F 65CM (CATHETERS) ×1 IMPLANT
DEVICE VASC CLSR CELT ART 6 (Vascular Products) ×1 IMPLANT
GUIDE CATH VISTA IMA 6F (CATHETERS) ×1 IMPLANT
KIT ENCORE 26 ADVANTAGE (KITS) ×1 IMPLANT
KIT MICROPUNCTURE NIT STIFF (SHEATH) ×1 IMPLANT
KIT PV (KITS) ×3 IMPLANT
SHEATH PINNACLE 5F 10CM (SHEATH) ×1 IMPLANT
SHEATH PINNACLE 6F 10CM (SHEATH) ×1 IMPLANT
SHEATH PROBE COVER 6X72 (BAG) ×1 IMPLANT
STOPCOCK MORSE 400PSI 3WAY (MISCELLANEOUS) ×1 IMPLANT
SYR MEDRAD MARK V 150ML (SYRINGE) ×1 IMPLANT
TRANSDUCER W/STOPCOCK (MISCELLANEOUS) ×3 IMPLANT
TRAY PV CATH (CUSTOM PROCEDURE TRAY) ×3 IMPLANT
TUBING CIL FLEX 10 FLL-RA (TUBING) ×1 IMPLANT
WIRE BENTSON .035X145CM (WIRE) ×1 IMPLANT
WIRE STABILIZER XS .014X180CM (WIRE) ×1 IMPLANT

## 2021-05-15 NOTE — H&P (Signed)
History and Physical Interval Note:  05/15/2021 1:03 PM  Jessica [name]  has presented today for surgery, with the diagnosis of MESENTRIC ischemia.  The various methods of treatment have been discussed with the patient and family. After consideration of risks, benefits and other options for treatment, the patient has consented to  Procedure(s): MESENTRIC ANGIOGRAPHY (N/A) as a surgical intervention.  The patient's history has been reviewed, patient examined, no change in status, stable for surgery.  I have reviewed the patient's chart and labs.  Questions were answered to the patient's satisfaction.     Marty Heck  Patient name: Jessica [name]   MRN: [id number]        DOB: 1932/08/22            Sex: female   REASON FOR VISIT: 68 month f/u for celiac artery stent   HPI: Jessica [name] is a 87 y.o. female who presents for 9 month follow-up for surveillance of celiac artery stent.  She previously underwent celiac artery stent on 05/15/2020 for 90% stenosis in the celiac artery and concern for underlying epigastric pain and possible mesenteric ischemia.  Her SMA was widely patent after evaluation with mesenteric arteriogram and SMA intervention was not performed.  Her transfemoral access was complicated by closure complication in the right groin after mynx deployment requiring right femoral thromboembolectomy and bovine patch on the same day as her celiac stent on 05/15/2020.  Initial 1 month follow-up she had a hematoma versus lymphocele in her right groin.  No evidence of pseudoaneurysm and we previously duplexed her here in the office.   On follow-up today she states she is doing well.  She is having no abdominal pain or postprandial pain.  Feels her weight has been stable.  She is taking Plavix still.  Does not smoke.         Past Medical History:  Diagnosis Date   Anemia     Arthritis      "thumbs" (06/27/2015)   Bilateral renal artery stenosis ([facility])     Cataract     Chronic bronchitis ([facility])      Chronic lower back pain     GERD (gastroesophageal reflux disease)     Headache      "probably weekly" (06/27/2015)   Hypertension     Hypertriglyceridemia     Migraine      "maybe monthly" (06/27/2015)           Past Surgical History:  Procedure Laterality Date   BALLOON DILATION   1980's?    "for renal stenosis"   CATARACT EXTRACTION, BILATERAL       ENDARTERECTOMY FEMORAL Right 05/15/2020    Procedure: Right Common Femoral Endarterectomy;  Surgeon: Marty Heck, MD;  [facility];  Service: Vascular;  Laterality: Right;   PATCH ANGIOPLASTY Right 05/15/2020    Procedure: Common Femoral Artery Patch Angioplasty using XenoSure Bovine Pericardial Patch;  Surgeon: Marty Heck, MD;  Location: [place];  Service: Vascular;  Laterality: Right;   PERIPHERAL VASCULAR INTERVENTION   05/15/2020    Procedure: PERIPHERAL VASCULAR INTERVENTION;  Surgeon: Marty Heck, MD;  Location: [place] [facility];  Service: Cardiovascular;;  Celiac   RECONSTRUCTION OF EYELID Bilateral 01/2016    Upper and lower eyelid   RENAL ANGIOPLASTY       TOE SURGERY Bilateral      "straightened big toe"   TONSILLECTOMY   ~ [place]  VISCERAL ANGIOGRAPHY N/A 05/15/2020    Procedure: MESENTERIC  ANGIOGRAPHY;  Surgeon: Marty Heck, MD;  Location: [place] [facility];  Service: Cardiovascular;  Laterality: N/A;           Family History  Problem Relation Age of Onset   Hypertension Brother     Hyperlipidemia Brother     Parkinsonism Father     Stroke Maternal Grandfather     Colon cancer Neg Hx     Colon polyps Neg Hx     Stomach cancer Neg Hx        SOCIAL HISTORY: Social History         Tobacco Use   Smoking status: Former Smoker      Packs/day: 0.50      Years: 20.00      Pack years: 10.00      Types: Cigarettes   Smokeless tobacco: Never Used   Tobacco comment: 'quit smoking in the 1990's?"  Substance Use Topics   Alcohol use: Yes       Alcohol/week: 0.0 - 1.0 standard drinks      Comment: rarely      No Known Allergies         Current Outpatient Medications  Medication Sig Dispense Refill   amLODipine (NORVASC) 5 MG tablet Take 5 mg by mouth daily.       atorvastatin (LIPITOR) 20 MG tablet Take 1 tablet (20 mg total) by mouth daily. 30 tablet 11   clopidogrel (PLAVIX) 75 MG tablet Take 1 tablet (75 mg total) by mouth daily. 30 tablet 0   Fluticasone-Umeclidin-Vilant (TRELEGY ELLIPTA) 100-62.5-25 MCG/INH AEPB Inhale 1 puff into the lungs daily. 2 each 0   gabapentin (NEURONTIN) 100 MG capsule Take 100 mg by mouth 2 (two) times daily as needed.       ipratropium (ATROVENT) 0.03 % nasal spray Place 2 sprays into both nostrils every 12 (twelve) hours. 30 mL 12   losartan (COZAAR) 100 MG tablet Take 100 mg by mouth daily.       mirtazapine (REMERON) 15 MG tablet Take 15 mg by mouth at bedtime.       montelukast (SINGULAIR) 10 MG tablet Take 10 mg by mouth at bedtime.   6   Multiple Vitamins-Minerals (MULTIVITAMIN PO) Take 1 tablet by mouth daily.       omeprazole (PRILOSEC) 40 MG capsule TAKE 1 CAPSULE (40 MG TOTAL) BY MOUTH IN THE MORNING AND AT BEDTIME. 180 capsule 1   propranolol ER (INDERAL LA) 120 MG 24 hr capsule Take 120 mg by mouth daily.   3   TRELEGY ELLIPTA 100-62.5-25 MCG/INH AEPB TAKE 1 PUFF BY MOUTH EVERY DAY 60 each 6    No current facility-administered medications for this visit.      REVIEW OF SYSTEMS:  [X]  denotes positive finding, [ ]  denotes negative finding Cardiac   Comments:  Chest pain or chest pressure:      Shortness of breath upon exertion:      Short of breath when lying flat:      Irregular heart rhythm:             Vascular      Pain in calf, thigh, or hip brought on by ambulation:      Pain in feet at night that wakes you up from your sleep:      Blood clot in your veins:      Leg swelling:  Pulmonary      Oxygen at home:      Productive cough:      Wheezing:              Neurologic      Sudden weakness in arms or legs:      Sudden numbness in arms or legs:      Sudden onset of difficulty speaking or slurred speech:      Temporary loss of vision in one eye:      Problems with dizziness:             Gastrointestinal      Blood in stool:      Vomited blood:             Genitourinary      Burning when urinating:      Blood in urine:             Psychiatric      Major depression:             Hematologic      Bleeding problems:      Problems with blood clotting too easily:             Skin      Rashes or ulcers:             Constitutional      Fever or chills:          PHYSICAL EXAM:    Vitals:    05/06/21 0840  BP: (!) 132/52  Pulse: (!) 59  Resp: 14  Temp: (!) 97.4 F (36.3 C)  TempSrc: Temporal  SpO2: 94%  Weight: 89 lb (40.4 kg)  Height: 5\' 2"  (1.575 m)      GENERAL: The patient is a well-nourished female, in no acute distress. The vital signs are documented above. CARDIAC: There is a regular rate and rhythm. VASCULAR:  Bilateral femoral pulses palpable     DATA:    Mesenteric duplex today suggest recurrent high-grade stenosis in the proximal celiac artery where the stent is and in addition she has had elevated velocities in the SMA also suggesting 50 to 99% stenosis but this was not correlated on angiogram last year.   Assessment/Plan:   87 yo F presents for 57-month follow-up of her celiac artery stent.  She previously had celiac artery stent on 05/15/2020 for 90% stenosis in the celiac artery and concern for underlying epigastric pain and possible mesenteric ischemia.  Her SMA was widely patent after evaluation with mesenteric arteriogram and SMA intervention was not required.  Her transfemoral access was complicated by closure complication in the right groin after mynx deployment requiring right femoral thromboembolectomy and bovine patch on the same day as her celiac stent on 05/15/2020.     Discussed based on duplex  today, I have concern that she has a recurrent high grade stenosis in the celiac artery stent.  I have recommended mesenteric angiogram to verify the stenosis and discussed we may try and reintervene on the stent if this is confirmed.  Discussed we would use her left common femoral artery given previous groin issues on the right.  She does feel that her abdominal pain got much better after celiac stenting last year.  She is going to discuss with her daughter and call back to schedule this is certainly not urgent.  She will continue Plavix from my standpoint.     Marty Heck, MD [facility]  Office: [phone]

## 2021-05-15 NOTE — Op Note (Signed)
    Patient name: Jessica [name] MRN: [id number] DOB: Apr 05, 1932 Sex: female  05/15/2021 Pre-operative Diagnosis: In-stent stenosis celiac artery stent Post-operative diagnosis:  Same Surgeon:  Marty Heck, MD Procedure Performed: 1.  Ultrasound-guided access left common femoral artery 2.  Mesenteric arteriogram 3.  Plain balloon angioplasty of celiac artery stent (5 mm x 15 mm Viatrac) 4.  Celt closure of left common femoral artery 5.  46 minutes of monitored moderate conscious sedation time  Indications: 87 year old female previously underwent celiac artery stenting for mesenteric stenosis with abdominal pain concerning for mesenteric ischemia.  On recent surveillance was noted to have recurrent high-grade stenosis >70% in her celiac stent and also concern for's high-grade stenosis in the SMA.  She presents today for mesenteric arteriogram after risk benefits discussed.  Findings: Celiac artery stent had a greater than 70% in-stent stenosis.  The SMA did not appear to have any significant flow-limiting stenosis.  Celiac artery stent was cannulated with an IMA catheter and a 014 stabilizer wire.  The celiac artery stent was angioplastied with a 5 mm angioplasty balloon with excellent results and no residual stenosis.   Procedure:  The patient was identified in the holding area and taken to room 8.  The patient was then placed supine on the table and prepped and draped in the usual sterile fashion.  A time out was called.  Ultrasound was used to evaluate the left common femoral artery.  It was patent .  A digital ultrasound image was acquired.  A micropuncture needle was used to access the left common femoral artery under ultrasound guidance.  An 018 wire was advanced without resistance and a micropuncture sheath was placed.  The 018 wire was removed and a benson wire was placed.  The micropuncture sheath was exchanged for a 5 french sheath.  An omniflush catheter was advanced into the  abdominal aorta into the visceral segment.  Fluoroscopic C-arm was placed in the lateral position.  Mesenteric arteriogram was obtained.  Ultimately after evaluating images, the celiac stent indeed had a greater than 70% in-stent stenosis and the SMA did look patent with no significant stenosis.  We upsized to a 6 French sheath in the left common femoral artery.  The patient was given 100 units/kg IV heparin.  I then used an IM catheter with a 014 stabilizer wire to cannulate the celiac artery stent and get the wire out into one of the branches.  I then used a 5 mm Viatrac balloon and a balloon angioplasty was performed of the celiac stent with excellent results and no residual stenosis.  I did flair the stent again by over sizing the balloon.  Wires and catheters were removed.  A Celt closure device was deployed in the left common femoral artery.   Plan: Patient will need dual antiplatelet therapy, she is already on Plavix.  I will see her in 1 month with a mesenteric duplex.  Marty Heck, MD [facility] [place] Office: [phone]

## 2021-05-16 ENCOUNTER — Encounter (HOSPITAL_COMMUNITY): Payer: Self-pay | Admitting: Vascular Surgery

## 2021-05-19 ENCOUNTER — Telehealth: Payer: Self-pay

## 2021-05-19 ENCOUNTER — Other Ambulatory Visit: Payer: Self-pay

## 2021-05-19 DIAGNOSIS — K551 Chronic vascular disorders of intestine: Secondary | ICD-10-CM

## 2021-05-19 NOTE — Telephone Encounter (Signed)
Patient calls today to report left leg pain from her knee to her foot that developed over the weekend. She is s/p mesenteric aortogram on 05/15/2021. Left leg was access leg. It is not swollen but when she is resting, her foot gets very numb and when she stands and walks describes pain as "a severe ache." It is not discolored, but patient says it is cooler than the other leg. Denies any other issues and says groin looks okay. Placed patient on schedule tomorrow for LE art duplex, ABI and evaluation.

## 2021-05-20 ENCOUNTER — Inpatient Hospital Stay (HOSPITAL_COMMUNITY)
Admission: RE | Admit: 2021-05-20 | Discharge: 2021-05-22 | DRG: 254 | Disposition: A | Discharge status: Home / Self Care | Payer: Medicare Other | Attending: Vascular Surgery | Admitting: Vascular Surgery

## 2021-05-20 ENCOUNTER — Encounter (HOSPITAL_COMMUNITY)
Admission: RE | Disposition: A | Discharge status: Home / Self Care | Payer: Self-pay | Source: Ambulatory Visit | Attending: Vascular Surgery

## 2021-05-20 ENCOUNTER — Encounter (HOSPITAL_COMMUNITY): Payer: Self-pay | Admitting: Vascular Surgery

## 2021-05-20 ENCOUNTER — Ambulatory Visit (INDEPENDENT_AMBULATORY_CARE_PROVIDER_SITE_OTHER): Payer: Medicare Other | Admitting: Physician Assistant

## 2021-05-20 ENCOUNTER — Ambulatory Visit (INDEPENDENT_AMBULATORY_CARE_PROVIDER_SITE_OTHER)
Admission: RE | Admit: 2021-05-20 | Discharge: 2021-05-20 | Disposition: A | Discharge status: Home / Self Care | Payer: Medicare Other | Source: Ambulatory Visit | Attending: Vascular Surgery | Admitting: Vascular Surgery

## 2021-05-20 ENCOUNTER — Ambulatory Visit: Payer: Medicare Other | Admitting: Nurse Practitioner

## 2021-05-20 ENCOUNTER — Inpatient Hospital Stay (HOSPITAL_COMMUNITY): Payer: Medicare Other | Admitting: Certified Registered"

## 2021-05-20 ENCOUNTER — Other Ambulatory Visit: Payer: Self-pay

## 2021-05-20 VITALS — BP 169/71 | HR 76 | Temp 97.6°F

## 2021-05-20 DIAGNOSIS — Z7982 Long term (current) use of aspirin: Secondary | ICD-10-CM | POA: Diagnosis not present

## 2021-05-20 DIAGNOSIS — Z7902 Long term (current) use of antithrombotics/antiplatelets: Secondary | ICD-10-CM | POA: Diagnosis not present

## 2021-05-20 DIAGNOSIS — M1990 Unspecified osteoarthritis, unspecified site: Secondary | ICD-10-CM | POA: Diagnosis present

## 2021-05-20 DIAGNOSIS — T82868A Thrombosis of vascular prosthetic devices, implants and grafts, initial encounter: Secondary | ICD-10-CM | POA: Diagnosis not present

## 2021-05-20 DIAGNOSIS — Z90710 Acquired absence of both cervix and uterus: Secondary | ICD-10-CM

## 2021-05-20 DIAGNOSIS — Z83438 Family history of other disorder of lipoprotein metabolism and other lipidemia: Secondary | ICD-10-CM | POA: Diagnosis not present

## 2021-05-20 DIAGNOSIS — Z20822 Contact with and (suspected) exposure to covid-19: Secondary | ICD-10-CM | POA: Diagnosis present

## 2021-05-20 DIAGNOSIS — K551 Chronic vascular disorders of intestine: Secondary | ICD-10-CM

## 2021-05-20 DIAGNOSIS — I7777 Dissection of artery of lower extremity: Secondary | ICD-10-CM | POA: Diagnosis not present

## 2021-05-20 DIAGNOSIS — I70229 Atherosclerosis of native arteries of extremities with rest pain, unspecified extremity: Secondary | ICD-10-CM | POA: Diagnosis present

## 2021-05-20 DIAGNOSIS — I10 Essential (primary) hypertension: Secondary | ICD-10-CM | POA: Diagnosis present

## 2021-05-20 DIAGNOSIS — Z8249 Family history of ischemic heart disease and other diseases of the circulatory system: Secondary | ICD-10-CM

## 2021-05-20 DIAGNOSIS — Z87891 Personal history of nicotine dependence: Secondary | ICD-10-CM | POA: Diagnosis not present

## 2021-05-20 DIAGNOSIS — E785 Hyperlipidemia, unspecified: Secondary | ICD-10-CM | POA: Diagnosis not present

## 2021-05-20 DIAGNOSIS — I70202 Unspecified atherosclerosis of native arteries of extremities, left leg: Secondary | ICD-10-CM

## 2021-05-20 DIAGNOSIS — K219 Gastro-esophageal reflux disease without esophagitis: Secondary | ICD-10-CM | POA: Diagnosis not present

## 2021-05-20 DIAGNOSIS — E781 Pure hyperglyceridemia: Secondary | ICD-10-CM | POA: Diagnosis not present

## 2021-05-20 DIAGNOSIS — Z8673 Personal history of transient ischemic attack (TIA), and cerebral infarction without residual deficits: Secondary | ICD-10-CM | POA: Diagnosis not present

## 2021-05-20 DIAGNOSIS — I70222 Atherosclerosis of native arteries of extremities with rest pain, left leg: Secondary | ICD-10-CM | POA: Diagnosis present

## 2021-05-20 DIAGNOSIS — Y838 Other surgical procedures as the cause of abnormal reaction of the patient, or of later complication, without mention of misadventure at the time of the procedure: Secondary | ICD-10-CM | POA: Diagnosis present

## 2021-05-20 DIAGNOSIS — Z95820 Peripheral vascular angioplasty status with implants and grafts: Secondary | ICD-10-CM | POA: Diagnosis not present

## 2021-05-20 DIAGNOSIS — M5416 Radiculopathy, lumbar region: Secondary | ICD-10-CM | POA: Diagnosis not present

## 2021-05-20 DIAGNOSIS — Z823 Family history of stroke: Secondary | ICD-10-CM

## 2021-05-20 DIAGNOSIS — J449 Chronic obstructive pulmonary disease, unspecified: Secondary | ICD-10-CM | POA: Diagnosis not present

## 2021-05-20 DIAGNOSIS — Z820 Family history of epilepsy and other diseases of the nervous system: Secondary | ICD-10-CM | POA: Diagnosis not present

## 2021-05-20 DIAGNOSIS — I70221 Atherosclerosis of native arteries of extremities with rest pain, right leg: Secondary | ICD-10-CM | POA: Diagnosis not present

## 2021-05-20 DIAGNOSIS — Z79899 Other long term (current) drug therapy: Secondary | ICD-10-CM

## 2021-05-20 HISTORY — PX: FEMORAL ARTERY EXPLORATION: SHX5160

## 2021-05-20 HISTORY — DX: Chronic obstructive pulmonary disease, unspecified: J44.9

## 2021-05-20 HISTORY — DX: Pneumonia, unspecified organism: J18.9

## 2021-05-20 LAB — CBC
HCT: 45.9 % (ref 36.0–46.0)
HCT: 36.5 % (ref 36.0–46.0)
Hemoglobin: 14.8 g/dL (ref 12.0–15.0)
Hemoglobin: 11.7 g/dL — ABNORMAL LOW (ref 12.0–15.0)
MCH: 30.5 pg (ref 26.0–34.0)
MCH: 30.4 pg (ref 26.0–34.0)
MCHC: 32.2 g/dL (ref 30.0–36.0)
MCHC: 32.1 g/dL (ref 30.0–36.0)
MCV: 94.6 fL (ref 80.0–100.0)
MCV: 94.8 fL (ref 80.0–100.0)
Platelets: 234 10*3/uL (ref 150–400)
Platelets: 189 10*3/uL (ref 150–400)
RBC: 4.85 MIL/uL (ref 3.87–5.11)
RBC: 3.85 MIL/uL — ABNORMAL LOW (ref 3.87–5.11)
RDW: 15.8 % — ABNORMAL HIGH (ref 11.5–15.5)
RDW: 15.6 % — ABNORMAL HIGH (ref 11.5–15.5)
WBC: 8.5 10*3/uL (ref 4.0–10.5)
WBC: 9.2 10*3/uL (ref 4.0–10.5)
nRBC: 0 % (ref 0.0–0.2)
nRBC: 0 % (ref 0.0–0.2)

## 2021-05-20 LAB — COMPREHENSIVE METABOLIC PANEL
ALT: 33 U/L (ref 0–44)
AST: 32 U/L (ref 15–41)
Albumin: 3.9 g/dL (ref 3.5–5.0)
Alkaline Phosphatase: 115 U/L (ref 38–126)
Anion gap: 10 (ref 5–15)
BUN: 15 mg/dL (ref 8–23)
CO2: 25 mmol/L (ref 22–32)
Calcium: 9.8 mg/dL (ref 8.9–10.3)
Chloride: 103 mmol/L (ref 98–111)
Creatinine, Ser: 0.72 mg/dL (ref 0.44–1.00)
GFR, Estimated: >60 mL/min (ref >60)
Glucose, Bld: 112 mg/dL — ABNORMAL HIGH (ref 70–99)
Potassium: 4.2 mmol/L (ref 3.5–5.1)
Sodium: 138 mmol/L (ref 135–145)
Total Bilirubin: 0.8 mg/dL (ref 0.3–1.2)
Total Protein: 7.3 g/dL (ref 6.5–8.1)

## 2021-05-20 LAB — APTT: aPTT: 30 seconds (ref 24–36)

## 2021-05-20 LAB — CREATININE, SERUM
Creatinine, Ser: 0.63 mg/dL (ref 0.44–1.00)
GFR, Estimated: >60 mL/min (ref >60)

## 2021-05-20 LAB — SURGICAL PCR SCREEN
MRSA, PCR: NEGATIVE
Staphylococcus aureus: NEGATIVE

## 2021-05-20 LAB — TYPE AND SCREEN
ABO/RH(D): O POS
Antibody Screen: NEGATIVE

## 2021-05-20 LAB — URINALYSIS, ROUTINE W REFLEX MICROSCOPIC
Bacteria, UA: NONE SEEN
Bilirubin Urine: NEGATIVE
Glucose, UA: NEGATIVE mg/dL
Hgb urine dipstick: NEGATIVE
Ketones, ur: NEGATIVE mg/dL
Leukocytes,Ua: NEGATIVE
Nitrite: NEGATIVE
Protein, ur: 30 mg/dL — AB
Specific Gravity, Urine: 1.011 (ref 1.005–1.030)
pH: 7 (ref 5.0–8.0)

## 2021-05-20 LAB — ABO/RH: ABO/RH(D): O POS

## 2021-05-20 LAB — PROTIME-INR
INR: 1 (ref 0.8–1.2)
Prothrombin Time: 12.8 seconds (ref 11.4–15.2)

## 2021-05-20 LAB — SARS CORONAVIRUS 2 BY RT PCR (HOSPITAL ORDER, PERFORMED IN ~~LOC~~ HOSPITAL LAB): SARS Coronavirus 2: NEGATIVE

## 2021-05-20 SURGERY — EXPLORATION, ARTERY, FEMORAL
Anesthesia: General | Site: Leg Upper | Laterality: Left

## 2021-05-20 MED ORDER — CHLORHEXIDINE GLUCONATE CLOTH 2 % EX PADS
6.0000 | MEDICATED_PAD | Freq: Every day | CUTANEOUS | Status: DC
  Administered 2021-05-20 – 2021-05-22 (×3): 6 via TOPICAL

## 2021-05-20 MED ORDER — SUGAMMADEX SODIUM 200 MG/2ML IV SOLN
INTRAVENOUS | Status: DC | PRN
  Administered 2021-05-20: 100 mg via INTRAVENOUS

## 2021-05-20 MED ORDER — MORPHINE SULFATE (PF) 2 MG/ML IV SOLN
2.0000 mg | INTRAVENOUS | Status: DC | PRN
  Administered 2021-05-20 – 2021-05-21 (×3): 2 mg via INTRAVENOUS
  Filled 2021-05-20 (×3): qty 1

## 2021-05-20 MED ORDER — FENTANYL CITRATE (PF) 100 MCG/2ML IJ SOLN
50.0000 ug | Freq: Once | INTRAMUSCULAR | Status: AC
Start: 2021-05-20 — End: 2021-05-20

## 2021-05-20 MED ORDER — HEPARIN SODIUM (PORCINE) 5000 UNIT/ML IJ SOLN
5000.0000 [IU] | Freq: Three times a day (TID) | INTRAMUSCULAR | Status: DC
  Administered 2021-05-21 – 2021-05-22 (×4): 5000 [IU] via SUBCUTANEOUS
  Filled 2021-05-20 (×4): qty 1

## 2021-05-20 MED ORDER — CHLORHEXIDINE GLUCONATE CLOTH 2 % EX PADS: 6.0000 | MEDICATED_PAD | Freq: Once | CUTANEOUS | Status: DC

## 2021-05-20 MED ORDER — HYDROMORPHONE HCL 1 MG/ML IJ SOLN
INTRAMUSCULAR | Status: AC
  Filled 2021-05-20: qty 1

## 2021-05-20 MED ORDER — DEXAMETHASONE SODIUM PHOSPHATE 10 MG/ML IJ SOLN
INTRAMUSCULAR | Status: DC | PRN
  Administered 2021-05-20: 10 mg via INTRAVENOUS

## 2021-05-20 MED ORDER — LABETALOL HCL 5 MG/ML IV SOLN: 10.0000 mg | INTRAVENOUS | Status: DC | PRN

## 2021-05-20 MED ORDER — ACETAMINOPHEN 325 MG PO TABS: 325.0000 mg | ORAL_TABLET | ORAL | Status: DC | PRN

## 2021-05-20 MED ORDER — FLUTICASONE FUROATE-VILANTEROL 100-25 MCG/INH IN AEPB
1.0000 | INHALATION_SPRAY | RESPIRATORY_TRACT | Status: DC
  Filled 2021-05-20: qty 28

## 2021-05-20 MED ORDER — PHENOL 1.4 % MT LIQD: 1.0000 | OROMUCOSAL | Status: DC | PRN

## 2021-05-20 MED ORDER — HYDRALAZINE HCL 20 MG/ML IJ SOLN: 5.0000 mg | INTRAMUSCULAR | Status: DC | PRN

## 2021-05-20 MED ORDER — GLYCOPYRROLATE PF 0.2 MG/ML IJ SOSY
PREFILLED_SYRINGE | INTRAMUSCULAR | Status: AC
  Filled 2021-05-20: qty 2

## 2021-05-20 MED ORDER — ALUM & MAG HYDROXIDE-SIMETH 200-200-20 MG/5ML PO SUSP: 15.0000 mL | ORAL | Status: DC | PRN

## 2021-05-20 MED ORDER — FENTANYL CITRATE (PF) 100 MCG/2ML IJ SOLN
INTRAMUSCULAR | Status: AC
  Administered 2021-05-20: 50 ug via INTRAVENOUS
  Filled 2021-05-20: qty 2

## 2021-05-20 MED ORDER — SODIUM CHLORIDE 0.9 % IV SOLN: INTRAVENOUS | Status: DC

## 2021-05-20 MED ORDER — AMLODIPINE BESYLATE 5 MG PO TABS
5.0000 mg | ORAL_TABLET | Freq: Every day | ORAL | Status: DC
  Administered 2021-05-21 – 2021-05-22 (×2): 5 mg via ORAL
  Filled 2021-05-20 (×2): qty 1

## 2021-05-20 MED ORDER — FENTANYL CITRATE (PF) 250 MCG/5ML IJ SOLN
INTRAMUSCULAR | Status: DC | PRN
  Administered 2021-05-20: 100 ug via INTRAVENOUS
  Administered 2021-05-20 (×2): 50 ug via INTRAVENOUS

## 2021-05-20 MED ORDER — LOSARTAN POTASSIUM 50 MG PO TABS: 100.0000 mg | ORAL_TABLET | Freq: Every evening | ORAL | Status: DC

## 2021-05-20 MED ORDER — ATORVASTATIN CALCIUM 10 MG PO TABS
20.0000 mg | ORAL_TABLET | Freq: Every day | ORAL | Status: DC
  Administered 2021-05-20 – 2021-05-21 (×2): 20 mg via ORAL
  Filled 2021-05-20 (×2): qty 2

## 2021-05-20 MED ORDER — CHLORHEXIDINE GLUCONATE 0.12 % MT SOLN
15.0000 mL | Freq: Once | OROMUCOSAL | Status: AC
Start: 2021-05-20 — End: 2021-05-20

## 2021-05-20 MED ORDER — UMECLIDINIUM BROMIDE 62.5 MCG/INH IN AEPB
1.0000 | INHALATION_SPRAY | RESPIRATORY_TRACT | Status: DC
  Filled 2021-05-20: qty 7

## 2021-05-20 MED ORDER — PROPRANOLOL HCL ER 60 MG PO CP24
120.0000 mg | ORAL_CAPSULE | Freq: Every day | ORAL | Status: DC
  Administered 2021-05-21 – 2021-05-22 (×2): 120 mg via ORAL
  Filled 2021-05-20: qty 1
  Filled 2021-05-20 (×2): qty 2

## 2021-05-20 MED ORDER — PANTOPRAZOLE SODIUM 40 MG PO TBEC
40.0000 mg | DELAYED_RELEASE_TABLET | Freq: Every day | ORAL | Status: DC
  Administered 2021-05-20 – 2021-05-22 (×3): 40 mg via ORAL
  Filled 2021-05-20 (×3): qty 1

## 2021-05-20 MED ORDER — LACTATED RINGERS IV SOLN: INTRAVENOUS | Status: DC

## 2021-05-20 MED ORDER — OXYCODONE-ACETAMINOPHEN 5-325 MG PO TABS
1.0000 | ORAL_TABLET | ORAL | Status: DC | PRN
  Administered 2021-05-20 – 2021-05-22 (×9): 2 via ORAL
  Filled 2021-05-20 (×9): qty 2

## 2021-05-20 MED ORDER — ALBUTEROL SULFATE (2.5 MG/3ML) 0.083% IN NEBU: 2.5000 mg | INHALATION_SOLUTION | Freq: Four times a day (QID) | RESPIRATORY_TRACT | Status: DC | PRN

## 2021-05-20 MED ORDER — GUAIFENESIN-DM 100-10 MG/5ML PO SYRP: 15.0000 mL | ORAL_SOLUTION | ORAL | Status: DC | PRN

## 2021-05-20 MED ORDER — ONDANSETRON HCL 4 MG/2ML IJ SOLN
INTRAMUSCULAR | Status: DC | PRN
  Administered 2021-05-20: 4 mg via INTRAVENOUS

## 2021-05-20 MED ORDER — HEPARIN SODIUM (PORCINE) 1000 UNIT/ML IJ SOLN
INTRAMUSCULAR | Status: DC | PRN
  Administered 2021-05-20: 4000 [IU] via INTRAVENOUS

## 2021-05-20 MED ORDER — FENTANYL CITRATE (PF) 250 MCG/5ML IJ SOLN
INTRAMUSCULAR | Status: AC
  Filled 2021-05-20: qty 5

## 2021-05-20 MED ORDER — PROPOFOL 10 MG/ML IV BOLUS
INTRAVENOUS | Status: AC
  Filled 2021-05-20: qty 20

## 2021-05-20 MED ORDER — PROMETHAZINE HCL 25 MG/ML IJ SOLN: 6.2500 mg | INTRAMUSCULAR | Status: DC | PRN

## 2021-05-20 MED ORDER — FLUTICASONE-UMECLIDIN-VILANT 100-62.5-25 MCG/INH IN AEPB: 1.0000 | INHALATION_SPRAY | Freq: Every evening | RESPIRATORY_TRACT | Status: DC

## 2021-05-20 MED ORDER — POTASSIUM CHLORIDE CRYS ER 20 MEQ PO TBCR: 20.0000 meq | EXTENDED_RELEASE_TABLET | Freq: Every day | ORAL | Status: DC | PRN

## 2021-05-20 MED ORDER — ROCURONIUM BROMIDE 10 MG/ML (PF) SYRINGE
PREFILLED_SYRINGE | INTRAVENOUS | Status: DC | PRN
  Administered 2021-05-20: 40 mg via INTRAVENOUS

## 2021-05-20 MED ORDER — CLOPIDOGREL BISULFATE 75 MG PO TABS
75.0000 mg | ORAL_TABLET | Freq: Every day | ORAL | Status: DC
  Administered 2021-05-21 – 2021-05-22 (×2): 75 mg via ORAL
  Filled 2021-05-20 (×2): qty 1

## 2021-05-20 MED ORDER — MAGNESIUM SULFATE 2 GM/50ML IV SOLN: 2.0000 g | Freq: Every day | INTRAVENOUS | Status: DC | PRN

## 2021-05-20 MED ORDER — OXYCODONE HCL 5 MG PO TABS: 5.0000 mg | ORAL_TABLET | Freq: Once | ORAL | Status: DC | PRN

## 2021-05-20 MED ORDER — ASPIRIN EC 81 MG PO TBEC
81.0000 mg | DELAYED_RELEASE_TABLET | Freq: Every day | ORAL | Status: DC
  Administered 2021-05-20 – 2021-05-22 (×3): 81 mg via ORAL
  Filled 2021-05-20 (×3): qty 1

## 2021-05-20 MED ORDER — ROCURONIUM BROMIDE 10 MG/ML (PF) SYRINGE
PREFILLED_SYRINGE | INTRAVENOUS | Status: AC
  Filled 2021-05-20: qty 10

## 2021-05-20 MED ORDER — EPHEDRINE SULFATE-NACL 50-0.9 MG/10ML-% IV SOSY
PREFILLED_SYRINGE | INTRAVENOUS | Status: DC | PRN
  Administered 2021-05-20: 10 mg via INTRAVENOUS
  Administered 2021-05-20: 5 mg via INTRAVENOUS

## 2021-05-20 MED ORDER — ORAL CARE MOUTH RINSE: 15.0000 mL | Freq: Once | OROMUCOSAL | Status: AC

## 2021-05-20 MED ORDER — DOCUSATE SODIUM 100 MG PO CAPS
100.0000 mg | ORAL_CAPSULE | Freq: Every day | ORAL | Status: DC
  Administered 2021-05-21 – 2021-05-22 (×2): 100 mg via ORAL
  Filled 2021-05-20 (×2): qty 1

## 2021-05-20 MED ORDER — GABAPENTIN 100 MG PO CAPS
100.0000 mg | ORAL_CAPSULE | Freq: Every day | ORAL | Status: DC
  Administered 2021-05-21: 100 mg via ORAL
  Filled 2021-05-20: qty 1

## 2021-05-20 MED ORDER — AMISULPRIDE (ANTIEMETIC) 5 MG/2ML IV SOLN: 10.0000 mg | Freq: Once | INTRAVENOUS | Status: DC | PRN

## 2021-05-20 MED ORDER — 0.9 % SODIUM CHLORIDE (POUR BTL) OPTIME
TOPICAL | Status: DC | PRN
  Administered 2021-05-20: 2000 mL

## 2021-05-20 MED ORDER — PROPOFOL 10 MG/ML IV BOLUS
INTRAVENOUS | Status: DC | PRN
  Administered 2021-05-20: 100 mg via INTRAVENOUS

## 2021-05-20 MED ORDER — CEFAZOLIN SODIUM-DEXTROSE 2-4 GM/100ML-% IV SOLN
2.0000 g | Freq: Three times a day (TID) | INTRAVENOUS | Status: AC
  Administered 2021-05-20 – 2021-05-21 (×2): 2 g via INTRAVENOUS
  Filled 2021-05-20 (×2): qty 100

## 2021-05-20 MED ORDER — MIRTAZAPINE 15 MG PO TABS
15.0000 mg | ORAL_TABLET | Freq: Every day | ORAL | Status: DC
  Administered 2021-05-20 – 2021-05-21 (×2): 15 mg via ORAL
  Filled 2021-05-20 (×2): qty 1

## 2021-05-20 MED ORDER — METOPROLOL TARTRATE 5 MG/5ML IV SOLN: 2.0000 mg | INTRAVENOUS | Status: DC | PRN

## 2021-05-20 MED ORDER — CEFAZOLIN SODIUM-DEXTROSE 2-4 GM/100ML-% IV SOLN
2.0000 g | INTRAVENOUS | Status: AC
  Administered 2021-05-20: 2 g via INTRAVENOUS
  Filled 2021-05-20: qty 100

## 2021-05-20 MED ORDER — PROTAMINE SULFATE 10 MG/ML IV SOLN
INTRAVENOUS | Status: DC | PRN
  Administered 2021-05-20: 20 mg via INTRAVENOUS
  Administered 2021-05-20: 10 mg via INTRAVENOUS
  Administered 2021-05-20: 20 mg via INTRAVENOUS

## 2021-05-20 MED ORDER — CHLORHEXIDINE GLUCONATE 0.12 % MT SOLN
OROMUCOSAL | Status: AC
  Administered 2021-05-20: 15 mL via OROMUCOSAL
  Filled 2021-05-20: qty 15

## 2021-05-20 MED ORDER — ACETAMINOPHEN 650 MG RE SUPP: 325.0000 mg | RECTAL | Status: DC | PRN

## 2021-05-20 MED ORDER — LIDOCAINE HCL (PF) 2 % IJ SOLN
INTRAMUSCULAR | Status: DC | PRN
  Administered 2021-05-20: 40 mg via INTRADERMAL

## 2021-05-20 MED ORDER — OXYCODONE HCL 5 MG/5ML PO SOLN: 5.0000 mg | Freq: Once | ORAL | Status: DC | PRN

## 2021-05-20 MED ORDER — LIDOCAINE HCL (PF) 2 % IJ SOLN
INTRAMUSCULAR | Status: AC
  Filled 2021-05-20: qty 5

## 2021-05-20 MED ORDER — ONDANSETRON HCL 4 MG/2ML IJ SOLN: 4.0000 mg | Freq: Four times a day (QID) | INTRAMUSCULAR | Status: DC | PRN

## 2021-05-20 MED ORDER — SODIUM CHLORIDE 0.9 % IV SOLN: 500.0000 mL | Freq: Once | INTRAVENOUS | Status: DC | PRN

## 2021-05-20 MED ORDER — HYDROMORPHONE HCL 1 MG/ML IJ SOLN
0.2500 mg | INTRAMUSCULAR | Status: DC | PRN
  Administered 2021-05-20 (×2): 0.25 mg via INTRAVENOUS
  Administered 2021-05-20: 0.5 mg via INTRAVENOUS

## 2021-05-20 MED ORDER — SODIUM CHLORIDE 0.9 % IV SOLN
INTRAVENOUS | Status: AC
  Filled 2021-05-20: qty 1.2

## 2021-05-20 MED ORDER — SODIUM CHLORIDE 0.9 % IV SOLN
INTRAVENOUS | Status: DC | PRN
  Administered 2021-05-20: 500 mL

## 2021-05-20 SURGICAL SUPPLY — 49 items
ADH SKN CLS APL DERMABOND .7 (GAUZE/BANDAGES/DRESSINGS) ×1
BANDAGE ESMARK 6X9 LF (GAUZE/BANDAGES/DRESSINGS) IMPLANT
BNDG CMPR 9X6 STRL LF SNTH (GAUZE/BANDAGES/DRESSINGS)
BNDG ESMARK 6X9 LF (GAUZE/BANDAGES/DRESSINGS)
CANISTER SUCT 3000ML PPV (MISCELLANEOUS) ×2 IMPLANT
CANNULA VESSEL 3MM 2 BLNT TIP (CANNULA) ×3 IMPLANT
CATH EMB 4FR 80CM (CATHETERS) ×2 IMPLANT
CLIP LIGATING EXTRA MED SLVR (CLIP) ×2 IMPLANT
CLIP LIGATING EXTRA SM BLUE (MISCELLANEOUS) ×2 IMPLANT
COVER WAND RF STERILE (DRAPES) ×1 IMPLANT
CUFF TOURN SGL QUICK 34 (TOURNIQUET CUFF)
CUFF TOURN SGL QUICK 42 (TOURNIQUET CUFF) IMPLANT
CUFF TRNQT CYL 34X4.125X (TOURNIQUET CUFF) IMPLANT
DERMABOND ADVANCED (GAUZE/BANDAGES/DRESSINGS) ×1
DERMABOND ADVANCED .7 DNX12 (GAUZE/BANDAGES/DRESSINGS) ×1 IMPLANT
DRAIN SNY 10X20 3/4 PERF (WOUND CARE) IMPLANT
DRAPE X-RAY CASS 24X20 (DRAPES) IMPLANT
ELECT REM PT RETURN 9FT ADLT (ELECTROSURGICAL) ×2
ELECTRODE REM PT RTRN 9FT ADLT (ELECTROSURGICAL) ×1 IMPLANT
EVACUATOR SILICONE 100CC (DRAIN) IMPLANT
GAUZE SPONGE 4X4 12PLY STRL (GAUZE/BANDAGES/DRESSINGS) ×2 IMPLANT
GLOVE SS BIOGEL STRL SZ 7.5 (GLOVE) ×1 IMPLANT
GLOVE SUPERSENSE BIOGEL SZ 7.5 (GLOVE) ×1
GOWN STRL REUS W/ TWL LRG LVL3 (GOWN DISPOSABLE) ×3 IMPLANT
GOWN STRL REUS W/TWL LRG LVL3 (GOWN DISPOSABLE) ×6
INSERT FOGARTY SM (MISCELLANEOUS) IMPLANT
KIT BASIN OR (CUSTOM PROCEDURE TRAY) ×2 IMPLANT
KIT TURNOVER KIT B (KITS) ×2 IMPLANT
NS IRRIG 1000ML POUR BTL (IV SOLUTION) ×4 IMPLANT
PACK PERIPHERAL VASCULAR (CUSTOM PROCEDURE TRAY) ×2 IMPLANT
PAD ARMBOARD 7.5X6 YLW CONV (MISCELLANEOUS) ×4 IMPLANT
PADDING CAST COTTON 6X4 STRL (CAST SUPPLIES) IMPLANT
PATCH VASC XENOSURE 1CMX6CM (Vascular Products) ×2 IMPLANT
PATCH VASC XENOSURE 1X6 (Vascular Products) IMPLANT
SET COLLECT BLD 21X3/4 12 (NEEDLE) IMPLANT
STAPLER VISISTAT 35W (STAPLE) IMPLANT
STOPCOCK 4 WAY LG BORE MALE ST (IV SETS) IMPLANT
SUT ETHILON 3 0 PS 1 (SUTURE) IMPLANT
SUT PROLENE 5 0 C 1 24 (SUTURE) ×2 IMPLANT
SUT PROLENE 6 0 CC (SUTURE) ×7 IMPLANT
SUT SILK 2 0 SH (SUTURE) ×2 IMPLANT
SUT VIC AB 2-0 CTX 36 (SUTURE) ×2 IMPLANT
SUT VIC AB 3-0 SH 27 (SUTURE) ×4
SUT VIC AB 3-0 SH 27X BRD (SUTURE) ×2 IMPLANT
TOWEL GREEN STERILE (TOWEL DISPOSABLE) ×2 IMPLANT
TRAY FOLEY MTR SLVR 16FR STAT (SET/KITS/TRAYS/PACK) ×2 IMPLANT
TUBING EXTENTION W/L.L. (IV SETS) IMPLANT
UNDERPAD 30X36 HEAVY ABSORB (UNDERPADS AND DIAPERS) ×2 IMPLANT
WATER STERILE IRR 1000ML POUR (IV SOLUTION) ×2 IMPLANT

## 2021-05-20 NOTE — Anesthesia Preprocedure Evaluation (Signed)
Anesthesia Evaluation  Patient identified by MRN, date of birth, ID band Patient awake    Reviewed: Allergy & Precautions, NPO status , Patient's Chart, lab work & pertinent test results  Airway Mallampati: II  TM Distance: >3 FB Neck ROM: Full    Dental  (+) Teeth Intact, Dental Advisory Given   Pulmonary neg pulmonary ROS, former smoker,    Pulmonary exam normal breath sounds clear to auscultation       Cardiovascular hypertension, Pt. on medications and Pt. on home beta blockers + Peripheral Vascular Disease (Leg ischemia)  Normal cardiovascular exam Rhythm:Regular Rate:Normal     Neuro/Psych  Headaches, TIA Neuromuscular disease    GI/Hepatic Neg liver ROS, GERD  Medicated,  Endo/Other  negative endocrine ROS  Renal/GU Renal hypertensionRenal disease     Musculoskeletal  (+) Arthritis ,   Abdominal   Peds  Hematology  (+) Blood dyscrasia (Plavix), ,   Anesthesia Other Findings Day of surgery medications reviewed with the patient.  Reproductive/Obstetrics                             Anesthesia Physical  Anesthesia Plan  ASA: III and emergent  Anesthesia Plan: General   Post-op Pain Management:    Induction: Intravenous  PONV Risk Score and Plan: 3 and Dexamethasone, Ondansetron, Treatment may vary due to age or medical condition and Midazolam  Airway Management Planned: Oral ETT  Additional Equipment:   Intra-op Plan:   Post-operative Plan: Extubation in OR  Informed Consent: I have reviewed the patients History and Physical, chart, labs and discussed the procedure including the risks, benefits and alternatives for the proposed anesthesia with the patient or authorized representative who has indicated his/her understanding and acceptance.     Dental advisory given  Plan Discussed with: CRNA  Anesthesia Plan Comments: (Risks/benefits of general anesthesia discussed  with patient including risk of damage to teeth, lips, gum, and tongue, nausea/vomiting, allergic reactions to medications, and the possibility of heart attack, stroke and death.  All patient questions answered.  Patient wishes to proceed.)        Anesthesia Quick Evaluation

## 2021-05-20 NOTE — Transfer of Care (Signed)
Immediate Anesthesia Transfer of Care Note  Patient: Jessica [name]  Procedure(s) Performed: LEFT LEG THROMBECTOMY WITH LEFT FEMORAL  ENDARTERECTOMY AND PATCH ANGIOPLASTY (Left: Leg Upper)  Patient Location: PACU  Anesthesia Type:General  Level of Consciousness: awake, alert  and oriented  Airway & Oxygen Therapy: Patient Spontanous Breathing and Patient connected to nasal cannula oxygen  Post-op Assessment: Report given to RN, Post -op Vital signs reviewed and stable and Patient moving all extremities X 4  Post vital signs: Reviewed and stable  Last Vitals:  Vitals Value Taken Time  BP 147/56 05/20/21 1509  Temp    Pulse 66 05/20/21 1515  Resp 13 05/20/21 1515  SpO2 96 % 05/20/21 1515  Vitals shown include unvalidated device data.  Last Pain:  Vitals:   05/20/21 1110  TempSrc:   PainSc: 5          Complications: No notable events documented.

## 2021-05-20 NOTE — Progress Notes (Signed)
Pt arrived from PACU, VSS, CHG complete, oriented to unit. Groin Level 0, no bleeding. Pulses Dopplered  Chrisandra Carota, RN

## 2021-05-20 NOTE — Anesthesia Procedure Notes (Signed)
Procedure Name: Intubation Date/Time: 05/20/2021 12:38 PM Performed by: Myna Bright, CRNA Pre-anesthesia Checklist: Patient identified, Emergency Drugs available, Patient being monitored and Suction available Patient Re-evaluated:Patient Re-evaluated prior to induction Oxygen Delivery Method: Circle system utilized Preoxygenation: Pre-oxygenation with 100% oxygen Induction Type: IV induction Ventilation: Mask ventilation without difficulty Laryngoscope Size: Mac and 3 Grade View: Grade I Tube type: Oral Tube size: 7.0 mm Number of attempts: 1 Airway Equipment and Method: Stylet Placement Confirmation: ETT inserted through vocal cords under direct vision, positive ETCO2 and breath sounds checked- equal and bilateral Secured at: 21 cm Tube secured with: Tape Dental Injury: Teeth and Oropharynx as per pre-operative assessment

## 2021-05-20 NOTE — Interval H&P Note (Signed)
History and Physical Interval Note:  05/20/2021 11:55 AM  Jessica [name]  has presented today for surgery, with the diagnosis of ISCHEMIC LEFT LEG.  The various methods of treatment have been discussed with the patient and family. After consideration of risks, benefits and other options for treatment, the patient has consented to  Procedure(s): FEMORAL EXTERNAL ILLIAC EXPLORATION POSSIBLE ENDARTERECTOMY (Left) as a surgical intervention.  The patient's history has been reviewed, patient examined, no change in status, stable for surgery.  I have reviewed the patient's chart and labs.  Questions were answered to the patient's satisfaction.     Curt Jews

## 2021-05-20 NOTE — Progress Notes (Addendum)
Dr. Sabra Heck notified of pt. Drinking coffee with cream , 1 cup at 0730 this am.    Pt requesting pain medication. Notified Dr. Sabra Heck, new orders received.

## 2021-05-20 NOTE — H&P (View-Only) (Signed)
[facility] [place] HISTORY AND PHYSICAL   History of Present Illness:   87 year old female previously underwent celiac artery stenting for mesenteric stenosis with abdominal pain concerning for mesenteric ischemia.  On recent surveillance was noted to have recurrent high-grade stenosis >70% in her celiac stent and also concern for's high-grade stenosis in the SMA.  S/P Plain balloon angioplasty of celiac artery stent (5 mm x 15 mm Viatrac).  Celt closure of left common femoral artery.  She is having no abdominal pain or postprandial pain.  She has had increased pain and numbness in the left LE and foot.  It is worse with ambulation.  She is able to do ADL's with pain.    She is taking Plavix still.  Past Medical History:  Diagnosis Date   Anemia    Arthritis    "thumbs" (06/27/2015)   Bilateral renal artery stenosis (HCC)    Cataract    Chronic bronchitis (HCC)    Chronic lower back pain    GERD (gastroesophageal reflux disease)    Headache    "probably weekly" (06/27/2015)   Hypertension    Hypertriglyceridemia    Migraine    "maybe monthly" (06/27/2015)    Past Surgical History:  Procedure Laterality Date   BALLOON DILATION  1980's?   "for renal stenosis"   CATARACT EXTRACTION, BILATERAL     ENDARTERECTOMY FEMORAL Right 05/15/2020   Procedure: Right Common Femoral Endarterectomy;  Surgeon: Marty Heck, MD;  Location: [facility];  Service: Vascular;  Laterality: Right;   PATCH ANGIOPLASTY Right 05/15/2020   Procedure: Common Femoral Artery Patch Angioplasty using XenoSure Bovine Pericardial Patch;  Surgeon: Marty Heck, MD;  Location: [place];  Service: Vascular;  Laterality: Right;   PERIPHERAL VASCULAR BALLOON ANGIOPLASTY  05/15/2021   Procedure: PERIPHERAL VASCULAR BALLOON ANGIOPLASTY;  Surgeon: Marty Heck, MD;  Location: [place] [facility];  Service: Cardiovascular;;   Celiac   PERIPHERAL VASCULAR INTERVENTION  05/15/2020   Procedure:  PERIPHERAL VASCULAR INTERVENTION;  Surgeon: Marty Heck, MD;  Location: [place] [facility];  Service: Cardiovascular;;  Celiac   RECONSTRUCTION OF EYELID Bilateral 01/2016   Upper and lower eyelid   RENAL ANGIOPLASTY     TOE SURGERY Bilateral    "straightened big toe"   TONSILLECTOMY  ~ [place] N/A 05/15/2020   Procedure: MESENTERIC  ANGIOGRAPHY;  Surgeon: Marty Heck, MD;  Location: [place] [facility];  Service: Cardiovascular;  Laterality: N/A;   VISCERAL ANGIOGRAPHY N/A 05/15/2021   Procedure: MESENTRIC ANGIOGRAPHY;  Surgeon: Marty Heck, MD;  Location: [place] [facility];  Service: Cardiovascular;  Laterality: N/A;    ROS:   General:  No weight loss, Fever, chills  HEENT: No recent headaches, no nasal bleeding, no visual changes, no sore throat  Neurologic: No dizziness, blackouts, seizures. No recent symptoms of stroke or mini- stroke. No recent episodes of slurred speech, or temporary blindness.  Cardiac: No recent episodes of chest pain/pressure, no shortness of breath at rest.  No shortness of breath with exertion.  Denies history of atrial fibrillation or irregular heartbeat  Vascular: No history of rest pain in feet.  No history of claudication.  No history of non-healing ulcer, No history of DVT   Pulmonary: No home oxygen, no productive cough, no hemoptysis,  No asthma or wheezing  Musculoskeletal:  [ ]  Arthritis, [ ]  Low back pain,  [ ]  Joint pain  Hematologic:No history of hypercoagulable state.  No history of easy bleeding.  No history of anemia  Gastrointestinal: No hematochezia or melena,  No gastroesophageal reflux, no trouble swallowing  Urinary: [ ]  chronic Kidney disease, [ ]  on HD - [ ]  MWF or [ ]  TTHS, [ ]  Burning with urination, [ ]  Frequent urination, [ ]  Difficulty urinating;   Skin: No rashes  Psychological: No history of anxiety,  No history of depression  Social  History Social History   Tobacco Use   Smoking status: Former    Packs/day: 0.50    Years: 20.00    Pack years: 10.00    Types: Cigarettes   Smokeless tobacco: Never   Tobacco comments:    'quit smoking in the 1990's?"  Vaping Use   Vaping Use: Never used  Substance Use Topics   Alcohol use: Yes    Alcohol/week: 0.0 - 1.0 standard drinks    Comment: rarely   Drug use: No    Family History Family History  Problem Relation Age of Onset   Hypertension Brother    Hyperlipidemia Brother    Parkinsonism Father    Stroke Maternal Grandfather    Colon cancer Neg Hx    Colon polyps Neg Hx    Stomach cancer Neg Hx     Allergies  No Known Allergies   Current Outpatient Medications  Medication Sig Dispense Refill   albuterol (VENTOLIN HFA) 108 (90 Base) MCG/ACT inhaler Inhale 2 puffs into the lungs every 6 (six) hours as needed for wheezing or shortness of breath.     amLODipine (NORVASC) 5 MG tablet Take 5 mg by mouth daily.     aspirin EC 81 MG tablet Take 1 tablet (81 mg total) by mouth daily. Swallow whole. 150 tablet 2   atorvastatin (LIPITOR) 20 MG tablet TAKE 1 TABLET BY MOUTH EVERY DAY 90 tablet 3   clopidogrel (PLAVIX) 75 MG tablet Take 1 tablet (75 mg total) by mouth daily. 30 tablet 0   Fluticasone-Umeclidin-Vilant (TRELEGY ELLIPTA) 100-62.5-25 MCG/INH AEPB Inhale 1 puff into the lungs daily. 2 each 0   gabapentin (NEURONTIN) 100 MG capsule Take 100 mg by mouth at bedtime.     HYDROcodone-acetaminophen (NORCO) 10-325 MG tablet Take 1 tablet by mouth 3 (three) times daily as needed.     ipratropium (ATROVENT) 0.03 % nasal spray Place 2 sprays into both nostrils every 12 (twelve) hours. (Patient taking differently: Place 2 sprays into both nostrils 2 (two) times daily as needed for rhinitis.) 30 mL 12   losartan (COZAAR) 100 MG tablet Take 100 mg by mouth daily.      mirtazapine (REMERON) 15 MG tablet Take 15 mg by mouth at bedtime.     montelukast (SINGULAIR) 10 MG  tablet Take 10 mg by mouth at bedtime.   6   Multiple Vitamins-Minerals (MULTIVITAMIN PO) Take 1 tablet by mouth daily.      Multiple Vitamins-Minerals (PRESERVISION AREDS 2) CAPS Take 1 capsule by mouth 2 (two) times daily.     omeprazole (PRILOSEC) 40 MG capsule TAKE 1 CAPSULE (40 MG TOTAL) BY MOUTH IN THE MORNING AND AT BEDTIME. 180 capsule 1   propranolol ER (INDERAL LA) 120 MG 24 hr capsule Take 120 mg by mouth daily.  3   No current facility-administered medications for this visit.    Physical Examination  Vitals:   05/20/21 0853  BP: (!) 169/71  Pulse: 76  Temp: 97.6 F (36.4 C)  TempSrc: Skin  SpO2: 95%  There is no height or weight on file to calculate BMI.  General:  Alert and oriented, no acute distress HEENT: Normal Neck: No bruit or JVD Pulmonary: Clear to auscultation bilaterally Cardiac: Regular Rate and Rhythm without murmur Abdomen: Soft, non-tender, non-distended, no mass, no scars Skin: No rash Extremity Pulses:  2+ radial, brachial, palpable right femoral, doppler signal right LE dorsalis pedis, posterior tibial  Musculoskeletal: No deformity or edema  Neurologic: Upper and lower extremity motor 5/5 and symmetric  DATA:     ABI Findings:  +---------+------------------+-----+----------+--------+  Right    Rt Pressure (mmHg)IndexWaveform  Comment   +---------+------------------+-----+----------+--------+  Brachial 149                                        +---------+------------------+-----+----------+--------+  PTA      97                0.64 monophasic          +---------+------------------+-----+----------+--------+  DP       94                0.62 monophasic          +---------+------------------+-----+----------+--------+  Great Toe32                0.21                     +---------+------------------+-----+----------+--------+   +---------+------------------+-----+--------+-------+  Left     Lt Pressure  (mmHg)IndexWaveformComment  +---------+------------------+-----+--------+-------+  Brachial 152                                     +---------+------------------+-----+--------+-------+  PTA                             absent           +---------+------------------+-----+--------+-------+  DP                              absent           +---------+------------------+-----+--------+-------+  Great Toe                       Absent           +---------+------------------+-----+--------+-------+   +-------+-----------+-----------+------------+------------+  ABI/TBIToday's ABIToday's TBIPrevious ABIPrevious TBI  +-------+-----------+-----------+------------+------------+  Right  0.64       0.21                                 +-------+-----------+-----------+------------+------------+  Left   0.00       0.00                                 +-------+-----------+-----------+------------+------------+     Summary:  Right: Resting right ankle-brachial index indicates moderate right lower  extremity arterial disease.  The right toe-brachial index is abnormal.   Left: The dorsalis pedis and posterior tibial arteries could not be  detected.    +----------+--------+-----+---------------+----------+----------+  LEFT      PSV cm/sRatioStenosis       Waveform  Comments    +----------+--------+-----+---------------+----------+----------+  EIA Prox  0            occluded                             +----------+--------+-----+---------------+----------+----------+  EIA Mid   0            occluded                             +----------+--------+-----+---------------+----------+----------+  EIA Distal0            occluded                             +----------+--------+-----+---------------+----------+----------+  CFA Distal32                          monophasic             +----------+--------+-----+---------------+----------+----------+  DFA                                             retrograde  +----------+--------+-----+---------------+----------+----------+  SFA Prox  39                          monophasic            +----------+--------+-----+---------------+----------+----------+  SFA Mid   48                          monophasic            +----------+--------+-----+---------------+----------+----------+  SFA Distal17                          monophasic            +----------+--------+-----+---------------+----------+----------+  POP Prox  14                          monophasic            +----------+--------+-----+---------------+----------+----------+  POP Mid   376          75-99% stenosismonophasic            +----------+--------+-----+---------------+----------+----------+  POP Distal23                          monophasic            +----------+--------+-----+---------------+----------+----------+  ATA Distal0                                                 +----------+--------+-----+---------------+----------+----------+  PTA Distal8                           monophasic            +----------+--------+-----+---------------+----------+----------+          Summary:  Left: No color or spectral Doppler flow observed in the external iliac  artery.  75-99% stenosis involving the mid popliteal artery.  Unsuccessful attempt to image  the common iliac artery due to bowel gas.   ASSESSMENT/ PLAN: S/P angioplasty of celiac artery stent with Celt closure of the left common femoral artery.  She has no arterial flow in the left LE with occluded left ext Iliac/femoral artery likely due to closure device.  She will be sent to [facility] for exploration of the left groin and possible left ext Iliac/femoral artery endarterectomy.    Dr. Carlis Abbott examined the patient and reviewed the studies and  agrees with this plan.      Roxy Horseman PA-C [facility] [place] Office: [phone]  MD in office Carlis Abbott

## 2021-05-20 NOTE — Progress Notes (Signed)
[facility] [place] HISTORY AND PHYSICAL   History of Present Illness:   87 year old female previously underwent celiac artery stenting for mesenteric stenosis with abdominal pain concerning for mesenteric ischemia.  On recent surveillance was noted to have recurrent high-grade stenosis >70% in her celiac stent and also concern for's high-grade stenosis in the SMA.  S/P Plain balloon angioplasty of celiac artery stent (5 mm x 15 mm Viatrac).  Celt closure of left common femoral artery.  She is having no abdominal pain or postprandial pain.  She has had increased pain and numbness in the left LE and foot.  It is worse with ambulation.  She is able to do ADL's with pain.    She is taking Plavix still.  Past Medical History:  Diagnosis Date   Anemia    Arthritis    "thumbs" (06/27/2015)   Bilateral renal artery stenosis (HCC)    Cataract    Chronic bronchitis (HCC)    Chronic lower back pain    GERD (gastroesophageal reflux disease)    Headache    "probably weekly" (06/27/2015)   Hypertension    Hypertriglyceridemia    Migraine    "maybe monthly" (06/27/2015)    Past Surgical History:  Procedure Laterality Date   BALLOON DILATION  1980's?   "for renal stenosis"   CATARACT EXTRACTION, BILATERAL     ENDARTERECTOMY FEMORAL Right 05/15/2020   Procedure: Right Common Femoral Endarterectomy;  Surgeon: Marty Heck, MD;  Location: [facility] OR;  Service: Vascular;  Laterality: Right;   PATCH ANGIOPLASTY Right 05/15/2020   Procedure: Common Femoral Artery Patch Angioplasty using XenoSure Bovine Pericardial Patch;  Surgeon: Marty Heck, MD;  Location: [place];  Service: Vascular;  Laterality: Right;   PERIPHERAL VASCULAR BALLOON ANGIOPLASTY  05/15/2021   Procedure: PERIPHERAL VASCULAR BALLOON ANGIOPLASTY;  Surgeon: Marty Heck, MD;  Location: [place] [facility];  Service: Cardiovascular;;   Celiac   PERIPHERAL VASCULAR INTERVENTION  05/15/2020   Procedure:  PERIPHERAL VASCULAR INTERVENTION;  Surgeon: Marty Heck, MD;  Location: [place] [facility];  Service: Cardiovascular;;  Celiac   RECONSTRUCTION OF EYELID Bilateral 01/2016   Upper and lower eyelid   RENAL ANGIOPLASTY     TOE SURGERY Bilateral    "straightened big toe"   TONSILLECTOMY  ~ [place] N/A 05/15/2020   Procedure: MESENTERIC  ANGIOGRAPHY;  Surgeon: Marty Heck, MD;  Location: [place] [facility];  Service: Cardiovascular;  Laterality: N/A;   VISCERAL ANGIOGRAPHY N/A 05/15/2021   Procedure: MESENTRIC ANGIOGRAPHY;  Surgeon: Marty Heck, MD;  Location: [place] [facility];  Service: Cardiovascular;  Laterality: N/A;    ROS:   General:  No weight loss, Fever, chills  HEENT: No recent headaches, no nasal bleeding, no visual changes, no sore throat  Neurologic: No dizziness, blackouts, seizures. No recent symptoms of stroke or mini- stroke. No recent episodes of slurred speech, or temporary blindness.  Cardiac: No recent episodes of chest pain/pressure, no shortness of breath at rest.  No shortness of breath with exertion.  Denies history of atrial fibrillation or irregular heartbeat  Vascular: No history of rest pain in feet.  No history of claudication.  No history of non-healing ulcer, No history of DVT   Pulmonary: No home oxygen, no productive cough, no hemoptysis,  No asthma or wheezing  Musculoskeletal:  [ ]  Arthritis, [ ]  Low back pain,  [ ]  Joint pain  Hematologic:No history of hypercoagulable state.  No history of easy bleeding.  No history of anemia  Gastrointestinal: No hematochezia or melena,  No gastroesophageal reflux, no trouble swallowing  Urinary: [ ]  chronic Kidney disease, [ ]  on HD - [ ]  MWF or [ ]  TTHS, [ ]  Burning with urination, [ ]  Frequent urination, [ ]  Difficulty urinating;   Skin: No rashes  Psychological: No history of anxiety,  No history of depression  Social  History Social History   Tobacco Use   Smoking status: Former    Packs/day: 0.50    Years: 20.00    Pack years: 10.00    Types: Cigarettes   Smokeless tobacco: Never   Tobacco comments:    'quit smoking in the 1990's?"  Vaping Use   Vaping Use: Never used  Substance Use Topics   Alcohol use: Yes    Alcohol/week: 0.0 - 1.0 standard drinks    Comment: rarely   Drug use: No    Family History Family History  Problem Relation Age of Onset   Hypertension Brother    Hyperlipidemia Brother    Parkinsonism Father    Stroke Maternal Grandfather    Colon cancer Neg Hx    Colon polyps Neg Hx    Stomach cancer Neg Hx     Allergies  No Known Allergies   Current Outpatient Medications  Medication Sig Dispense Refill   albuterol (VENTOLIN HFA) 108 (90 Base) MCG/ACT inhaler Inhale 2 puffs into the lungs every 6 (six) hours as needed for wheezing or shortness of breath.     amLODipine (NORVASC) 5 MG tablet Take 5 mg by mouth daily.     aspirin EC 81 MG tablet Take 1 tablet (81 mg total) by mouth daily. Swallow whole. 150 tablet 2   atorvastatin (LIPITOR) 20 MG tablet TAKE 1 TABLET BY MOUTH EVERY DAY 90 tablet 3   clopidogrel (PLAVIX) 75 MG tablet Take 1 tablet (75 mg total) by mouth daily. 30 tablet 0   Fluticasone-Umeclidin-Vilant (TRELEGY ELLIPTA) 100-62.5-25 MCG/INH AEPB Inhale 1 puff into the lungs daily. 2 each 0   gabapentin (NEURONTIN) 100 MG capsule Take 100 mg by mouth at bedtime.     HYDROcodone-acetaminophen (NORCO) 10-325 MG tablet Take 1 tablet by mouth 3 (three) times daily as needed.     ipratropium (ATROVENT) 0.03 % nasal spray Place 2 sprays into both nostrils every 12 (twelve) hours. (Patient taking differently: Place 2 sprays into both nostrils 2 (two) times daily as needed for rhinitis.) 30 mL 12   losartan (COZAAR) 100 MG tablet Take 100 mg by mouth daily.      mirtazapine (REMERON) 15 MG tablet Take 15 mg by mouth at bedtime.     montelukast (SINGULAIR) 10 MG  tablet Take 10 mg by mouth at bedtime.   6   Multiple Vitamins-Minerals (MULTIVITAMIN PO) Take 1 tablet by mouth daily.      Multiple Vitamins-Minerals (PRESERVISION AREDS 2) CAPS Take 1 capsule by mouth 2 (two) times daily.     omeprazole (PRILOSEC) 40 MG capsule TAKE 1 CAPSULE (40 MG TOTAL) BY MOUTH IN THE MORNING AND AT BEDTIME. 180 capsule 1   propranolol ER (INDERAL LA) 120 MG 24 hr capsule Take 120 mg by mouth daily.  3   No current facility-administered medications for this visit.    Physical Examination  Vitals:   05/20/21 0853  BP: (!) 169/71  Pulse: 76  Temp: 97.6 F (36.4 C)  TempSrc: Skin  SpO2: 95%  There is no height or weight on file to calculate BMI.  General:  Alert and oriented, no acute distress HEENT: Normal Neck: No bruit or JVD Pulmonary: Clear to auscultation bilaterally Cardiac: Regular Rate and Rhythm without murmur Abdomen: Soft, non-tender, non-distended, no mass, no scars Skin: No rash Extremity Pulses:  2+ radial, brachial, palpable right femoral, doppler signal right LE dorsalis pedis, posterior tibial  Musculoskeletal: No deformity or edema  Neurologic: Upper and lower extremity motor 5/5 and symmetric  DATA:     ABI Findings:  +---------+------------------+-----+----------+--------+  Right    Rt Pressure (mmHg)IndexWaveform  Comment   +---------+------------------+-----+----------+--------+  Brachial 149                                        +---------+------------------+-----+----------+--------+  PTA      97                0.64 monophasic          +---------+------------------+-----+----------+--------+  DP       94                0.62 monophasic          +---------+------------------+-----+----------+--------+  Great Toe32                0.21                     +---------+------------------+-----+----------+--------+   +---------+------------------+-----+--------+-------+  Left     Lt Pressure  (mmHg)IndexWaveformComment  +---------+------------------+-----+--------+-------+  Brachial 152                                     +---------+------------------+-----+--------+-------+  PTA                             absent           +---------+------------------+-----+--------+-------+  DP                              absent           +---------+------------------+-----+--------+-------+  Great Toe                       Absent           +---------+------------------+-----+--------+-------+   +-------+-----------+-----------+------------+------------+  ABI/TBIToday's ABIToday's TBIPrevious ABIPrevious TBI  +-------+-----------+-----------+------------+------------+  Right  0.64       0.21                                 +-------+-----------+-----------+------------+------------+  Left   0.00       0.00                                 +-------+-----------+-----------+------------+------------+     Summary:  Right: Resting right ankle-brachial index indicates moderate right lower  extremity arterial disease.  The right toe-brachial index is abnormal.   Left: The dorsalis pedis and posterior tibial arteries could not be  detected.    +----------+--------+-----+---------------+----------+----------+  LEFT      PSV cm/sRatioStenosis       Waveform  Comments    +----------+--------+-----+---------------+----------+----------+  EIA Prox  0            occluded                             +----------+--------+-----+---------------+----------+----------+  EIA Mid   0            occluded                             +----------+--------+-----+---------------+----------+----------+  EIA Distal0            occluded                             +----------+--------+-----+---------------+----------+----------+  CFA Distal32                          monophasic             +----------+--------+-----+---------------+----------+----------+  DFA                                             retrograde  +----------+--------+-----+---------------+----------+----------+  SFA Prox  39                          monophasic            +----------+--------+-----+---------------+----------+----------+  SFA Mid   48                          monophasic            +----------+--------+-----+---------------+----------+----------+  SFA Distal17                          monophasic            +----------+--------+-----+---------------+----------+----------+  POP Prox  14                          monophasic            +----------+--------+-----+---------------+----------+----------+  POP Mid   376          75-99% stenosismonophasic            +----------+--------+-----+---------------+----------+----------+  POP Distal23                          monophasic            +----------+--------+-----+---------------+----------+----------+  ATA Distal0                                                 +----------+--------+-----+---------------+----------+----------+  PTA Distal8                           monophasic            +----------+--------+-----+---------------+----------+----------+          Summary:  Left: No color or spectral Doppler flow observed in the external iliac  artery.  75-99% stenosis involving the mid popliteal artery.  Unsuccessful attempt to image  the common iliac artery due to bowel gas.   ASSESSMENT/ PLAN: S/P angioplasty of celiac artery stent with Celt closure of the left common femoral artery.  She has no arterial flow in the left LE with occluded left ext Iliac/femoral artery likely due to closure device.  She will be sent to [facility] for exploration of the left groin and possible left ext Iliac/femoral artery endarterectomy.    Dr. Carlis Abbott examined the patient and reviewed the studies and  agrees with this plan.      Roxy Horseman PA-C [facility] [place] Office: [phone]  MD in office Carlis Abbott

## 2021-05-20 NOTE — Op Note (Signed)
OPERATIVE REPORT  DATE OF SURGERY: 05/20/2021  PATIENT: Jessica [name], 87 y.o. female MRN: [id number]  DOB: 21-Feb-1932  PRE-OPERATIVE DIAGNOSIS: Left leg ischemia  POST-OPERATIVE DIAGNOSIS:  Same  PROCEDURE: Left femoral exploration and endarterectomy and bovine pericardial patch angioplasty  SURGEON:  Curt Jews, M.D.  PHYSICIAN ASSISTANT: Arlee Muslim, PA-C  The assistant was needed for exposure and to expedite the case  ANESTHESIA: General  EBL: per anesthesia record  Total I/O In: 1800 [I.V.:1800] Out: 50 [Urine:50]  BLOOD ADMINISTERED: none  DRAINS: none  SPECIMEN: none  COUNTS CORRECT:  YES  PATIENT DISPOSITION:  PACU - hemodynamically stable  PROCEDURE DETAILS: Patient status post left femoral access for mesenteric angioplasty on 05/15/2021 with Dr. Fortunato Curling.  She presented today with symptoms of ischemia.  She had diminished sensory function but had motor function intact in her left foot.  She had an audible flow.  She is taken the operating room this time for exploration and revascularization with presumed thrombosis at the access site.  The patient was taken up and placed evaluation where the area of the left groin left leg were prepped and draped in usual sterile fashion.  Incision was made over the femoral artery and carried down to isolate the artery which was very small.  The superficial femoral and profundus femoris arteries were isolated as well and were encircled with Vesseloops.  Dissection was continued up under the inguinal ligament and external iliac artery was encircled with a vessel loop above tributary branches.  The patient was given 4000 units of intravenous heparin and after adequate circulation time the superficial femoral and profundus femoris arteries were occluded.  The patient had a mechanical closure device on the anterior surface of the common femoral artery in the appropriate location.  There was no pulse in the common femoral artery.   The artery was opened longitudinally and this closure device was removed.  There was a dissection that began approximately and was extended distally to the common femoral superficial femoral and profundus junction.  This had caused occlusion distally.  There was no thrombus distally.  The common femoral was endarterectomized and the superficial femoral was endarterectomized.  The plaque was transected and was tacked on the superficial femoral artery just distal to its origin with interrupted 6-0 Prolene sutures.  The deep femoral artery was endarterectomized with an eversion technique with good backbleeding.  A Fogarty was passed centrally to the level of the aorta and thrombus was removed with moderate inflow.  With additional passes a large core of dissected plaque was removed and there was excellent inflow.  The external iliac was reoccluded.  A bovine pericardial patch was brought onto the field and was sewn as a patch angioplasty for closure of the femoral arteriotomy.  Prior to completion of the closure the usual flushing maneuvers were undertaken.  Anastomosis was closed and flow was restored to the foot.  There was audible Doppler flow in the dorsalis pedis and posterior tibial artery.  The patient was given 50 mg of protamine to reverse heparin.  Wounds were irrigated with saline.  Hemostasis electrocautery.  Wounds were closed with 3-0 Vicryl in several layers in the subcutaneous tissue and the skin was closed with 3-0 subcuticular Vicryl stitch.  Sterile dressing was applied and the patient was transferred to the recovery room in stable condition  Rosetta Posner, M.D., [facility] 05/20/2021 3:50 PM  Note: Portions of this report may have been transcribed using voice recognition software.  Every  effort has been made to ensure accuracy; however, inadvertent computerized transcription errors may still be present.

## 2021-05-21 ENCOUNTER — Encounter (HOSPITAL_COMMUNITY): Payer: Self-pay | Admitting: Vascular Surgery

## 2021-05-21 LAB — CBC
HCT: 34.2 % — ABNORMAL LOW (ref 36.0–46.0)
Hemoglobin: 11.2 g/dL — ABNORMAL LOW (ref 12.0–15.0)
MCH: 30.8 pg (ref 26.0–34.0)
MCHC: 32.7 g/dL (ref 30.0–36.0)
MCV: 94 fL (ref 80.0–100.0)
Platelets: 203 10*3/uL (ref 150–400)
RBC: 3.64 MIL/uL — ABNORMAL LOW (ref 3.87–5.11)
RDW: 15.6 % — ABNORMAL HIGH (ref 11.5–15.5)
WBC: 7 10*3/uL (ref 4.0–10.5)
nRBC: 0 % (ref 0.0–0.2)

## 2021-05-21 LAB — BASIC METABOLIC PANEL
Anion gap: 10 (ref 5–15)
BUN: 14 mg/dL (ref 8–23)
CO2: 22 mmol/L (ref 22–32)
Calcium: 8.4 mg/dL — ABNORMAL LOW (ref 8.9–10.3)
Chloride: 102 mmol/L (ref 98–111)
Creatinine, Ser: 0.7 mg/dL (ref 0.44–1.00)
GFR, Estimated: >60 mL/min (ref >60)
Glucose, Bld: 251 mg/dL — ABNORMAL HIGH (ref 70–99)
Potassium: 3.9 mmol/L (ref 3.5–5.1)
Sodium: 134 mmol/L — ABNORMAL LOW (ref 135–145)

## 2021-05-21 MED ORDER — FLUTICASONE FUROATE-VILANTEROL 100-25 MCG/INH IN AEPB
1.0000 | INHALATION_SPRAY | RESPIRATORY_TRACT | Status: DC
  Filled 2021-05-21: qty 28

## 2021-05-21 MED ORDER — UMECLIDINIUM BROMIDE 62.5 MCG/INH IN AEPB
1.0000 | INHALATION_SPRAY | RESPIRATORY_TRACT | Status: DC
  Filled 2021-05-21: qty 7

## 2021-05-21 NOTE — Anesthesia Postprocedure Evaluation (Signed)
Anesthesia Post Note  Patient: Jessica [name]  Procedure(s) Performed: LEFT LEG THROMBECTOMY WITH LEFT FEMORAL  ENDARTERECTOMY AND PATCH ANGIOPLASTY (Left: Leg Upper)     Patient location during evaluation: PACU Anesthesia Type: General Level of consciousness: awake and alert Pain management: pain level controlled Vital Signs Assessment: post-procedure vital signs reviewed and stable Respiratory status: spontaneous breathing, nonlabored ventilation and respiratory function stable Cardiovascular status: blood pressure returned to baseline and stable Postop Assessment: no apparent nausea or vomiting Anesthetic complications: no   No notable events documented.  Last Vitals:  Vitals:   05/21/21 0500 05/21/21 0700  BP: (!) 146/47 (!) 131/47  Pulse: 67 77  Resp: 15 17  Temp:    SpO2: 95% 91%    Last Pain:  Vitals:   05/21/21 0700  TempSrc: Oral  PainSc:                  Lynda Rainwater
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
## 2021-05-21 NOTE — Progress Notes (Addendum)
  Progress Note    05/21/2021 7:38 AM 1 Day Post-Op  Subjective:  Severe pain in L foot overnight but seems to be better this morning   Vitals:   05/21/21 0317 05/21/21 0500  BP: (!) 110/40 (!) 146/47  Pulse: 60 67  Resp: 13 15  Temp: 97.6 F (36.4 C)   SpO2: 92% 95%   Physical Exam Lungs:  non labored Incisions:  L groin incision c/d/i Extremities:  L PT and ATA brisk by doppler at the ankle; L calf soft Abdomen:  soft, NT Neurologic: A&O  CBC    Component Value Date/Time   WBC 7.0 05/21/2021 0138   RBC 3.64 (L) 05/21/2021 0138   HGB 11.2 (L) 05/21/2021 0138   HCT 34.2 (L) 05/21/2021 0138   PLT 203 05/21/2021 0138   MCV 94.0 05/21/2021 0138   MCH 30.8 05/21/2021 0138   MCHC 32.7 05/21/2021 0138   RDW 15.6 (H) 05/21/2021 0138   LYMPHSABS 2.3 06/27/2015 0025   MONOABS 0.6 06/27/2015 0025   EOSABS 0.3 06/27/2015 0025   BASOSABS 0.1 06/27/2015 0025    BMET    Component Value Date/Time   NA 134 (L) 05/21/2021 0138   K 3.9 05/21/2021 0138   CL 102 05/21/2021 0138   CO2 22 05/21/2021 0138   GLUCOSE 251 (H) 05/21/2021 0138   BUN 14 05/21/2021 0138   CREATININE 0.70 05/21/2021 0138   CALCIUM 8.4 (L) 05/21/2021 0138   GFRNONAA >60 05/21/2021 0138   GFRAA >60 07/16/2020 0235    INR    Component Value Date/Time   INR 1.0 05/20/2021 1042     Intake/Output Summary (Last 24 hours) at 05/21/2021 0738 Last data filed at 05/21/2021 3299 Gross per 24 hour  Intake 3199.21 ml  Output 875 ml  Net 2324.21 ml     Assessment/Plan:  87 y.o. female is s/p repair of L CFA 1 Day Post-Op   BLE well perfused by doppler exam; L calf is soft OOB this morning; PT/OT ordered Home when pain controlled and mobility improved   Dagoberto Ligas, PA-C [facility] [phone] 05/21/2021 7:38 AM  I have seen and evaluated the patient. I agree with the PA note as documented above.  Postop day 1 status post left common femoral endarterectomy with bovine  patch.  Complication of closure device in left common femoral artery.  Left groin looks good.  She has brisk anterior tibial and posterior tibial Doppler signals in the left foot.  Calf is soft.  She had some pain in the foot overnight that is improving.  States she is only taking p.o. pain medicine.  Wants to try and go home today.  We will see how she works with PT OT and plan late day discharge if possible.  We will continue aspirin Plavix from my standpoint for mesenteric stent.  Marty Heck, MD [facility] [place] Office: [phone]

## 2021-05-21 NOTE — Evaluation (Signed)
Occupational Therapy Evaluation/Discharge Patient Details Name: Jessica [name] MRN: [id number] DOB: Feb 07, 1932 Today's Date: 05/21/2021    History of Present Illness 87 year old female admitted 6/21 with L LE pain /numbness due to ischemia, s/p L femoral explarationa dn endarterectomy with angioplasty.  PMH: chronic bronchitis, LBP, COPD, HTN, celiac art stenting.   Clinical Impression   PTA, pt lives alone and reports Independence in all ADLs, IADLs and mobility without AD. Pt was grocery shopping and completing yard work. Pt presents now with minor deficits in pain that limit ability to reach L foot for LB dressing tasks. Educated on positional strategies to trial though anticipate quick return to independence once pain subsides. Pt trialed mobility to/from bathroom without AD Independently with no overt safety concerns. Pt's daughter plans to stay with pt to assist with IADLs, tub transfers, LB dressing as needed. From ADL and functional standpoint, pt appropriate for DC home with daughter support. OT to sign off at acute level. Please reconsult if needs change.     Follow Up Recommendations  No OT follow up;Supervision - Intermittent    Equipment Recommendations  None recommended by OT    Recommendations for Other Services       Precautions / Restrictions Precautions Precautions: Fall Restrictions Weight Bearing Restrictions: No LLE Weight Bearing: Weight bearing as tolerated      Mobility Bed Mobility Overal bed mobility: Modified Independent             General bed mobility comments: up in chair    Transfers Overall transfer level: Independent Equipment used: None Transfers: Sit to/from Stand Sit to Stand: Independent         General transfer comment: trialed without RW with no LOB or overt safety concerns noted    Balance Overall balance assessment: No apparent balance deficits (not formally assessed)                                          ADL either performed or assessed with clinical judgement   ADL Overall ADL's : Needs assistance/impaired Eating/Feeding: Independent;Sitting   Grooming: Independent;Standing;Wash/dry hands   Upper Body Bathing: Independent   Lower Body Bathing: Set up;Sit to/from stand Lower Body Bathing Details (indicate cue type and reason): Setup only for pt to bathe peri region in bathroom unassisted and without AD Upper Body Dressing : Independent   Lower Body Dressing: Minimal assistance Lower Body Dressing Details (indicate cue type and reason): Difficulty reaching L foot for sock mgmt, Educated on positional strategies to trial (likely propping foot up on bed) but anticipate quickly able to reach L foot once pain subsides, healing begins and flexibility improves Toilet Transfer: Independent Toilet Transfer Details (indicate cue type and reason): trialed without AD. slow, cautious pace but no LOB or safety concerns Toileting- Clothing Manipulation and Hygiene: Independent;Sit to/from stand Toileting - Clothing Manipulation Details (indicate cue type and reason): no assist needed     Functional mobility during ADLs: Independent General ADL Comments: Pt close to baseline for ADLs, only limited in ability to reach L foot for LB dressing/bathing though anticipate quick return to independence within a few days for this task. Encouraged use of RW if feeling weak or unstable, encouraged daughter to be present for tub transfers, and use of shower chair as needed (daughter will obtain if needed)     Vision Patient Visual Report: No change from baseline Vision Assessment?:  No apparent visual deficits     Perception     Praxis      Pertinent Vitals/Pain Pain Assessment: Faces Faces Pain Scale: Hurts a little bit Pain Location: groin Pain Descriptors / Indicators: Discomfort;Grimacing;Guarding Pain Intervention(s): Monitored during session     Hand Dominance Right   Extremity/Trunk  Assessment Upper Extremity Assessment Upper Extremity Assessment: Overall WFL for tasks assessed   Lower Extremity Assessment Lower Extremity Assessment: Overall WFL for tasks assessed   Cervical / Trunk Assessment Cervical / Trunk Assessment: Normal   Communication Communication Communication: No difficulties   Cognition Arousal/Alertness: Awake/alert Behavior During Therapy: WFL for tasks assessed/performed Overall Cognitive Status: Within Functional Limits for tasks assessed                                     General Comments       Exercises     Shoulder Instructions      Home Living Family/patient expects to be discharged to:: Private residence Living Arrangements: Alone Available Help at Discharge: Family;Available 24 hours/day (daughter plans to stay with pt for a while to assist) Type of Home: House Home Access: Stairs to enter [employer] of Steps: 1 then 2 Entrance Stairs-Rails: None Home Layout: One level     Bathroom Shower/Tub: Teacher, early years/pre: Standard     Home Equipment: Environmental consultant - 2 wheels;Cane - single point   Additional Comments: DME was her late mother's      Prior Functioning/Environment Level of Independence: Independent        Comments: Drives, grocery shops, does yard work, cares for [employer], does all IADLs in the home        OT Problem List: Pain      OT Treatment/Interventions:      OT Goals(Current goals can be found in the care plan section) Acute Rehab OT Goals Patient Stated Goal: home, independent OT Goal Formulation: All assessment and education complete, DC therapy  OT Frequency:     Barriers to D/C:            Co-evaluation              AM-PAC OT "6 Clicks" Daily Activity     Outcome Measure Help from another person eating meals?: None Help from another person taking care of personal grooming?: None Help from another person toileting, which includes using  toliet, bedpan, or urinal?: None Help from another person bathing (including washing, rinsing, drying)?: None Help from another person to put on and taking off regular upper body clothing?: None Help from another person to put on and taking off regular lower body clothing?: A Little 6 Click Score: 23   End of Session Nurse Communication: Mobility status;Other (comment);Patient requests pain meds (urination after catheter removal)  Activity Tolerance: Patient tolerated treatment well Patient left: in chair;with call bell/phone within reach  OT Visit Diagnosis: Pain Pain - Right/Left: Left Pain - part of body:  (groin)                Time: 9163-8466 OT Time Calculation (min): 16 min Charges:  OT General Charges $OT Visit: 1 Visit OT Evaluation $OT Eval Low Complexity: 1 Low  Malachy Chamber, OTR/L [facility] Re[facility] Services Office: [phone]   Layla Maw 05/21/2021, 12:17 PM

## 2021-05-22 NOTE — Progress Notes (Addendum)
  Progress Note    05/22/2021 7:29 AM 2 Days Post-Op  Subjective:  wants to go home.  Afebrile HR 50's-60's NSR 758'I-325'Q systolic 98% RA  Vitals:   05/21/21 2215 05/22/21 0337  BP: (!) 145/49 (!) 139/53  Pulse:  70  Resp: 19 15  Temp: 98 F (36.7 C) 98.3 F (36.8 C)  SpO2: 95% 96%    Physical Exam: Cardiac:  regular Lungs:  non labored Incisions:  clean and dry Extremities:  brisk doppler signals left DP/PT; left calf is soft and non tender   CBC    Component Value Date/Time   WBC 7.0 05/21/2021 0138   RBC 3.64 (L) 05/21/2021 0138   HGB 11.2 (L) 05/21/2021 0138   HCT 34.2 (L) 05/21/2021 0138   PLT 203 05/21/2021 0138   MCV 94.0 05/21/2021 0138   MCH 30.8 05/21/2021 0138   MCHC 32.7 05/21/2021 0138   RDW 15.6 (H) 05/21/2021 0138   LYMPHSABS 2.3 06/27/2015 0025   MONOABS 0.6 06/27/2015 0025   EOSABS 0.3 06/27/2015 0025   BASOSABS 0.1 06/27/2015 0025    BMET    Component Value Date/Time   NA 134 (L) 05/21/2021 0138   K 3.9 05/21/2021 0138   CL 102 05/21/2021 0138   CO2 22 05/21/2021 0138   GLUCOSE 251 (H) 05/21/2021 0138   BUN 14 05/21/2021 0138   CREATININE 0.70 05/21/2021 0138   CALCIUM 8.4 (L) 05/21/2021 0138   GFRNONAA >60 05/21/2021 0138   GFRAA >60 07/16/2020 0235    INR    Component Value Date/Time   INR 1.0 05/20/2021 1042     Intake/Output Summary (Last 24 hours) at 05/22/2021 0729 Last data filed at 05/21/2021 1500 Gross per 24 hour  Intake 51.91 ml  Output --  Net 51.91 ml     Assessment/Plan:  87 y.o. female is s/p:  Left femoral exploration and endarterectomy and bovine pericardial patch angioplasty  2 Days Post-Op   -pt doing well this morning with brisk left doppler signals PT/DP.  Left calf is soft and non tender.  Motor and sensation are in tact. -she ambulated yesterday.   -discharge home today and she will f/u in 2-3 weeks on Dr. Ainsley Spinner clinic day.   -no PT/OT recommended -discussed groin wound care with pt.   PDMP reviewed.  Received Norco #120 05/14/2021.  Will not send pain rx to pharmacy.   Leontine Locket, PA-C [facility] [phone] 05/22/2021 7:29 AM  I have seen and evaluated the patient. I agree with the PA note as documented above.  87 year old female postop day 2 status post left femoral endarterectomy after she presented to clinic with ischemic left leg following device closure for mesenteric intervention.  Left groin looks good this morning.  She has brisk DP and PT signals in the left foot.  She worked with therapy yesterday.  Pain is much improved.  We will plan discharge today.  Aspirin Plavix for mesenteric stent as discussed with her.  We will arrange follow-up in 2 to 3 weeks for incision check of the groin in the office with me.  Marty Heck, MD [facility] [place] Office: [phone]
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
## 2021-05-22 NOTE — Progress Notes (Signed)
Pt refused morning inhalers. Pt stated that she brought her home inhaler and has been using it.

## 2021-05-22 NOTE — Progress Notes (Signed)
Physical Therapy Treatment Patient Details Name: Jessica [name] MRN: [id number] DOB: 11/10/1932 Today's Date: 05/22/2021    History of Present Illness 87 year old female admitted 6/21 with L LE pain /numbness due to ischemia, s/p L femoral explarationa dn endarterectomy with angioplasty.  PMH: chronic bronchitis, LBP, COPD, HTN, celiac art stenting.    PT Comments    Pt received in chair, agreeable to therapy session and with good participation and tolerance for gait and stair training. Pt reports moderate pain and able to progress gait with no AD and Supervision at most, no physical assist needed. Pt performed x5 steps with min guard at most and cueing, good tolerance. Pt continues to benefit from PT services to progress toward functional mobility goals.    Follow Up Recommendations  No PT follow up     Equipment Recommendations  None recommended by PT    Recommendations for Other Services       Precautions / Restrictions Precautions Precautions: Fall Restrictions Weight Bearing Restrictions: No    Mobility  Bed Mobility               General bed mobility comments: up in chair    Transfers Overall transfer level: Independent Equipment used: None Transfers: Sit to/from Stand Sit to Stand: Independent         General transfer comment: trialed without RW with no LOB or overt safety concerns noted, cues for advancing L foot pre standing for pain relief if needed  Ambulation/Gait Ambulation/Gait assistance: Supervision Gait Distance (Feet): 150 Feet Assistive device: None Gait Pattern/deviations: Step-through pattern;Decreased stride length   Gait velocity interpretation: <1.31 ft/sec, indicative of household ambulator General Gait Details: slow cadence, decreased B foot clearance but steady, HR 60-72 bpm with exertion   Stairs Stairs: Yes Stairs assistance: Min guard Stair Management: One rail Right;Step to pattern;Forwards Number of Stairs: 5 General stair  comments: cues for sequencing/safety, no overt LOB, good carryover of instruction   Wheelchair Mobility    Modified Rankin (Stroke Patients Only)       Balance Overall balance assessment: No apparent balance deficits (not formally assessed)                                          Cognition Arousal/Alertness: Awake/alert Behavior During Therapy: WFL for tasks assessed/performed Overall Cognitive Status: Within Functional Limits for tasks assessed                                        Exercises Other Exercises Other Exercises: encouraged ankle pumps x10, HEP handout given for supine ROM/strengthening within pain-free range    General Comments General comments (skin integrity, edema, etc.): HR 56 bpm resting and 60-72 bpm with exertion, no increased WOB and VSS per chart review, pt on RA      Pertinent Vitals/Pain Pain Assessment: 0-10 Pain Score: 5  Pain Location: L groin at incision site Pain Descriptors / Indicators: Discomfort;Grimacing Pain Intervention(s): Limited activity within patient's tolerance;Monitored during session;Premedicated before session;Repositioned (pain meds ~1hr prior)    Home Living                      Prior Function            PT Goals (current goals can now be found in the  care plan section) Acute Rehab PT Goals Patient Stated Goal: home, independent PT Goal Formulation: With patient Time For Goal Achievement: 05/28/21 Progress towards PT goals: Progressing toward goals    Frequency    Min 3X/week      PT Plan Current plan remains appropriate    Co-evaluation              AM-PAC PT "6 Clicks" Mobility   Outcome Measure  Help needed turning from your back to your side while in a flat bed without using bedrails?: None Help needed moving from lying on your back to sitting on the side of a flat bed without using bedrails?: None Help needed moving to and from a bed to a chair  (including a wheelchair)?: None Help needed standing up from a chair using your arms (e.g., wheelchair or bedside chair)?: None Help needed to walk in hospital room?: A Little Help needed climbing 3-5 steps with a railing? : A Little 6 Click Score: 22    End of Session Equipment Utilized During Treatment: Gait belt Activity Tolerance: Patient tolerated treatment well Patient left: in chair;with call bell/phone within reach Nurse Communication: Mobility status PT Visit Diagnosis: Other abnormalities of gait and mobility (R26.89)     Time: 4314-2767 PT Time Calculation (min) (ACUTE ONLY): 20 min  Charges:  $Gait Training: 8-22 mins                     Nayomi Tabron P., PTA Acute Rehabilitation Services Pager: [phone] Office: [place] 05/22/2021, 11:01 AM

## 2021-05-26 DIAGNOSIS — H353221 Exudative age-related macular degeneration, left eye, with active choroidal neovascularization: Secondary | ICD-10-CM | POA: Diagnosis not present

## 2021-05-26 DIAGNOSIS — H35373 Puckering of macula, bilateral: Secondary | ICD-10-CM | POA: Diagnosis not present

## 2021-05-26 DIAGNOSIS — H43813 Vitreous degeneration, bilateral: Secondary | ICD-10-CM | POA: Diagnosis not present

## 2021-05-26 DIAGNOSIS — H353112 Nonexudative age-related macular degeneration, right eye, intermediate dry stage: Secondary | ICD-10-CM | POA: Diagnosis not present

## 2021-05-27 ENCOUNTER — Telehealth: Payer: Self-pay

## 2021-05-27 NOTE — Telephone Encounter (Signed)
Patient calls today to report numbness in her left foot s/p thrombectomy on 6/21 after ischemic event post aortogram. Her foot prior to thrombectomy was not numb, but was painful. Says it is now numb but not painful. Denies discoloration or temperature change - says it is slightly swollen. Advised patient that due to a few days of decreased/absent blood flow to her foot - it was very likely the numbness was normal and would take some time to dissipate. Discussed with Dr. Donnetta Hutching - offered patient an appointment with [facility] next week to doppler/ensure flow. Patient would like to be seen just for peace of mind - added to schedule and advised patient to call back sooner/report to ED if acute changes to the foot - severe pain, color/temperature change. She will keep her appt in a few weeks with mesenteric duplex. Patient verbalizes understanding.

## 2021-06-03 ENCOUNTER — Ambulatory Visit (INDEPENDENT_AMBULATORY_CARE_PROVIDER_SITE_OTHER): Payer: Medicare Other | Admitting: Vascular Surgery

## 2021-06-03 ENCOUNTER — Other Ambulatory Visit: Payer: Self-pay

## 2021-06-03 VITALS — BP 118/71 | HR 77 | Temp 97.5°F | Resp 14 | Ht 62.0 in | Wt 88.0 lb

## 2021-06-03 DIAGNOSIS — Z79899 Other long term (current) drug therapy: Secondary | ICD-10-CM | POA: Diagnosis not present

## 2021-06-03 DIAGNOSIS — M5459 Other low back pain: Secondary | ICD-10-CM | POA: Diagnosis not present

## 2021-06-03 DIAGNOSIS — G894 Chronic pain syndrome: Secondary | ICD-10-CM | POA: Diagnosis not present

## 2021-06-03 DIAGNOSIS — K551 Chronic vascular disorders of intestine: Secondary | ICD-10-CM

## 2021-06-03 DIAGNOSIS — I739 Peripheral vascular disease, unspecified: Secondary | ICD-10-CM

## 2021-06-03 DIAGNOSIS — Z79891 Long term (current) use of opiate analgesic: Secondary | ICD-10-CM | POA: Diagnosis not present

## 2021-06-03 NOTE — Progress Notes (Signed)
Patient name: Jessica [name] MRN: [id number] DOB: 04-Mar-1932 Sex: female  REASON FOR VISIT: Triage visit for foot numbness  HPI: Jessica [name] is a 87 y.o. female that presents for triage visit to evaluate for foot numbness.  She is well-known to vascular surgery and previously had a celiac stent placed for mesenteric ischemia.  Recently underwent celiac stent angioplasty on 05/15/2021 for an in-stent stenosis.  There is also question of a high-grade SMA stenosis and on angiography we confirmed that this was not a significant stenosis.  We used a Celt closure device in the left common femoral artery and ultimately she had a complication of the closure device and developed a left common femoral occlusion.  She subsequently underwent a left common femoral endarterectomy with bovine patch with findings of a dissection on 05/20/2021.  Today she states that she is at home living independently.  She does have some numbness on the medial aspect of both feet.  Her left groin is healing without issue.  She had some baseline numbness in both feet even prior to recent intervention.  Left groin healing.    Past Medical History:  Diagnosis Date   Anemia    Arthritis    "thumbs" (06/27/2015)   Bilateral renal artery stenosis (HCC)    Cataract    Chronic bronchitis (HCC)    Chronic lower back pain    COPD (chronic obstructive pulmonary disease) (HCC)    GERD (gastroesophageal reflux disease)    Headache    "probably weekly" (06/27/2015)   Hypertension    Hypertriglyceridemia    Migraine    "maybe monthly" (06/27/2015)   Pneumonia     Past Surgical History:  Procedure Laterality Date   BALLOON DILATION  1980's?   "for renal stenosis"   CATARACT EXTRACTION, BILATERAL     ENDARTERECTOMY FEMORAL Right 05/15/2020   Procedure: Right Common Femoral Endarterectomy;  Surgeon: Marty Heck, MD;  Location: [facility] OR;  Service: Vascular;  Laterality: Right;   FEMORAL ARTERY EXPLORATION Left 05/20/2021    Procedure: LEFT LEG THROMBECTOMY WITH LEFT FEMORAL  ENDARTERECTOMY AND PATCH ANGIOPLASTY;  Surgeon: Rosetta Posner, MD;  Location: [facility] OR;  Service: Vascular;  Laterality: Left;   PATCH ANGIOPLASTY Right 05/15/2020   Procedure: Common Femoral Artery Patch Angioplasty using XenoSure Bovine Pericardial Patch;  Surgeon: Marty Heck, MD;  Location: [place];  Service: Vascular;  Laterality: Right;   PERIPHERAL VASCULAR BALLOON ANGIOPLASTY  05/15/2021   Procedure: PERIPHERAL VASCULAR BALLOON ANGIOPLASTY;  Surgeon: Marty Heck, MD;  Location: [place] [facility];  Service: Cardiovascular;;   Celiac   PERIPHERAL VASCULAR INTERVENTION  05/15/2020   Procedure: PERIPHERAL VASCULAR INTERVENTION;  Surgeon: Marty Heck, MD;  Location: [place] [facility];  Service: Cardiovascular;;  Celiac   RECONSTRUCTION OF EYELID Bilateral 01/2016   Upper and lower eyelid   RENAL ANGIOPLASTY     TOE SURGERY Bilateral    "straightened big toe"   TONSILLECTOMY  ~ [place] N/A 05/15/2020   Procedure: MESENTERIC  ANGIOGRAPHY;  Surgeon: Marty Heck, MD;  Location: [place] [facility];  Service: Cardiovascular;  Laterality: N/A;   VISCERAL ANGIOGRAPHY N/A 05/15/2021   Procedure: MESENTRIC ANGIOGRAPHY;  Surgeon: Marty Heck, MD;  Location: [place] [facility];  Service: Cardiovascular;  Laterality: N/A;    Family History  Problem Relation Age of Onset   Hypertension Brother    Hyperlipidemia Brother  Parkinsonism Father    Stroke Maternal Grandfather    Colon cancer Neg Hx    Colon polyps Neg Hx    Stomach cancer Neg Hx     SOCIAL HISTORY: Social History   Tobacco Use   Smoking status: Former    Packs/day: 0.50    Years: 20.00    Pack years: 10.00    Types: Cigarettes   Smokeless tobacco: Never   Tobacco comments:    'quit smoking in the 1990's?"  Substance Use Topics   Alcohol use: Yes    Alcohol/week: 0.0 - 1.0 standard  drinks    Comment: rarely    No Known Allergies  Current Outpatient Medications  Medication Sig Dispense Refill   albuterol (VENTOLIN HFA) 108 (90 Base) MCG/ACT inhaler Inhale 2 puffs into the lungs every 6 (six) hours as needed for wheezing or shortness of breath.     amLODipine (NORVASC) 5 MG tablet Take 5 mg by mouth daily.     aspirin EC 81 MG tablet Take 1 tablet (81 mg total) by mouth daily. Swallow whole. 150 tablet 2   atorvastatin (LIPITOR) 20 MG tablet TAKE 1 TABLET BY MOUTH EVERY DAY (Patient taking differently: Take 20 mg by mouth daily.) 90 tablet 3   clopidogrel (PLAVIX) 75 MG tablet Take 1 tablet (75 mg total) by mouth daily. 30 tablet 0   Fluticasone-Umeclidin-Vilant (TRELEGY ELLIPTA) 100-62.5-25 MCG/INH AEPB Inhale 1 puff into the lungs daily. (Patient taking differently: Inhale 1 puff into the lungs every evening.) 2 each 0   gabapentin (NEURONTIN) 100 MG capsule Take 100 mg by mouth at bedtime.     HYDROcodone-acetaminophen (NORCO) 10-325 MG tablet Take 1 tablet by mouth 3 (three) times daily as needed (pain).     ipratropium (ATROVENT) 0.03 % nasal spray Place 2 sprays into both nostrils every 12 (twelve) hours. (Patient taking differently: Place 2 sprays into both nostrils 2 (two) times daily as needed for rhinitis.) 30 mL 12   losartan (COZAAR) 100 MG tablet Take 100 mg by mouth every evening.     mirtazapine (REMERON) 15 MG tablet Take 15 mg by mouth at bedtime.     montelukast (SINGULAIR) 10 MG tablet Take 10 mg by mouth at bedtime.   6   Multiple Vitamins-Minerals (MULTIVITAMIN PO) Take 1 tablet by mouth daily.      Multiple Vitamins-Minerals (PRESERVISION AREDS 2) CAPS Take 1 capsule by mouth 2 (two) times daily.     omeprazole (PRILOSEC) 40 MG capsule TAKE 1 CAPSULE (40 MG TOTAL) BY MOUTH IN THE MORNING AND AT BEDTIME. 180 capsule 1   propranolol ER (INDERAL LA) 120 MG 24 hr capsule Take 120 mg by mouth daily.  3   No current facility-administered medications for  this visit.    REVIEW OF SYSTEMS:  [X]  denotes positive finding, [ ]  denotes negative finding Cardiac  Comments:  Chest pain or chest pressure:    Shortness of breath upon exertion:    Short of breath when lying flat:    Irregular heart rhythm:        Vascular    Pain in calf, thigh, or hip brought on by ambulation:    Pain in feet at night that wakes you up from your sleep:     Blood clot in your veins:    Leg swelling:         Pulmonary    Oxygen at home:    Productive cough:     Wheezing:  Neurologic    Sudden weakness in arms or legs:     Sudden numbness in arms or legs:     Sudden onset of difficulty speaking or slurred speech:    Temporary loss of vision in one eye:     Problems with dizziness:         Gastrointestinal    Blood in stool:     Vomited blood:         Genitourinary    Burning when urinating:     Blood in urine:        Psychiatric    Major depression:         Hematologic    Bleeding problems:    Problems with blood clotting too easily:        Skin    Rashes or ulcers:        Constitutional    Fever or chills:      PHYSICAL EXAM: Vitals:   06/03/21 0923  BP: 118/71  Pulse: 77  Resp: 14  Temp: (!) 97.5 F (36.4 C)  TempSrc: Temporal  SpO2: 93%  Weight: 88 lb (39.9 kg)  Height: 5\' 2"  (1.575 m)    GENERAL: The patient is a well-nourished female, in no acute distress. The vital signs are documented above. CARDIAC: There is a regular rate and rhythm.  VASCULAR:  Left groin incision healing appropriately Left femoral pulse palpable Brisk dorsalis pedis and posterior tibial signals bilaterally  DATA:   None  Assessment/Plan:  87 year old female underwent celiac stent angioplasty on 05/15/2021 for in-stent stenosis with Celt closure of the left common femoral artery complicated by dissection and subsequent ischemic left leg.  She underwent left common femoral endarterectomy with bovine patch on 05/20/2021.  I offered her  reassurance today that her left groin is healing appropriately.  She has a nice left femoral pulse palpable.  She has good Doppler signals in the left foot in the dorsalis pedis and posterior tibial.  Discussed that she did have some intermittent numbness in both feet even prior to surgery and this may be some underlying neuropathy.  I do not think she has any profound ischemia at this time that explains her numbness and the left foot is well perfused at this time.  I will refer her to outpatient physical therapy and she already has follow-up with me on 06/17/2021 to evaluate her celiac stent after recent intervention and we will plan to keep that appointment to monitor her progress.   Marty Heck, MD [facility] [place] Office: [phone]

## 2021-06-06 ENCOUNTER — Other Ambulatory Visit: Payer: Self-pay | Admitting: *Deleted

## 2021-06-06 DIAGNOSIS — K551 Chronic vascular disorders of intestine: Secondary | ICD-10-CM

## 2021-06-10 DIAGNOSIS — I700 Atherosclerosis of aorta: Secondary | ICD-10-CM | POA: Diagnosis not present

## 2021-06-10 DIAGNOSIS — N1831 Chronic kidney disease, stage 3a: Secondary | ICD-10-CM | POA: Diagnosis not present

## 2021-06-10 DIAGNOSIS — M792 Neuralgia and neuritis, unspecified: Secondary | ICD-10-CM | POA: Diagnosis not present

## 2021-06-10 DIAGNOSIS — R7301 Impaired fasting glucose: Secondary | ICD-10-CM | POA: Diagnosis not present

## 2021-06-10 DIAGNOSIS — E785 Hyperlipidemia, unspecified: Secondary | ICD-10-CM | POA: Diagnosis not present

## 2021-06-10 DIAGNOSIS — D649 Anemia, unspecified: Secondary | ICD-10-CM | POA: Diagnosis not present

## 2021-06-10 DIAGNOSIS — J432 Centrilobular emphysema: Secondary | ICD-10-CM | POA: Diagnosis not present

## 2021-06-10 DIAGNOSIS — R202 Paresthesia of skin: Secondary | ICD-10-CM | POA: Diagnosis not present

## 2021-06-10 DIAGNOSIS — I774 Celiac artery compression syndrome: Secondary | ICD-10-CM | POA: Diagnosis not present

## 2021-06-10 DIAGNOSIS — I10 Essential (primary) hypertension: Secondary | ICD-10-CM | POA: Diagnosis not present

## 2021-06-10 DIAGNOSIS — F329 Major depressive disorder, single episode, unspecified: Secondary | ICD-10-CM | POA: Diagnosis not present

## 2021-06-10 DIAGNOSIS — M810 Age-related osteoporosis without current pathological fracture: Secondary | ICD-10-CM | POA: Diagnosis not present

## 2021-06-10 DIAGNOSIS — E871 Hypo-osmolality and hyponatremia: Secondary | ICD-10-CM | POA: Diagnosis not present

## 2021-06-17 ENCOUNTER — Encounter: Payer: Self-pay | Admitting: Vascular Surgery

## 2021-06-17 ENCOUNTER — Other Ambulatory Visit: Payer: Self-pay

## 2021-06-17 ENCOUNTER — Ambulatory Visit (HOSPITAL_COMMUNITY)
Admission: RE | Admit: 2021-06-17 | Discharge: 2021-06-17 | Disposition: A | Discharge status: Home / Self Care | Payer: Medicare Other | Source: Ambulatory Visit | Attending: Vascular Surgery | Admitting: Vascular Surgery

## 2021-06-17 ENCOUNTER — Ambulatory Visit (INDEPENDENT_AMBULATORY_CARE_PROVIDER_SITE_OTHER): Payer: Medicare Other | Admitting: Vascular Surgery

## 2021-06-17 VITALS — BP 148/72 | HR 68 | Temp 97.6°F | Resp 14 | Ht 62.0 in | Wt 88.0 lb

## 2021-06-17 DIAGNOSIS — R7301 Impaired fasting glucose: Secondary | ICD-10-CM | POA: Diagnosis not present

## 2021-06-17 DIAGNOSIS — K551 Chronic vascular disorders of intestine: Secondary | ICD-10-CM | POA: Insufficient documentation

## 2021-06-17 DIAGNOSIS — E785 Hyperlipidemia, unspecified: Secondary | ICD-10-CM | POA: Diagnosis not present

## 2021-06-17 DIAGNOSIS — M810 Age-related osteoporosis without current pathological fracture: Secondary | ICD-10-CM | POA: Diagnosis not present

## 2021-06-17 DIAGNOSIS — I739 Peripheral vascular disease, unspecified: Secondary | ICD-10-CM

## 2021-06-17 DIAGNOSIS — E871 Hypo-osmolality and hyponatremia: Secondary | ICD-10-CM | POA: Diagnosis not present

## 2021-06-17 DIAGNOSIS — M792 Neuralgia and neuritis, unspecified: Secondary | ICD-10-CM | POA: Diagnosis not present

## 2021-06-17 DIAGNOSIS — D649 Anemia, unspecified: Secondary | ICD-10-CM | POA: Diagnosis not present

## 2021-06-17 NOTE — Progress Notes (Signed)
Patient name: Jessica [name] MRN: [id number] DOB: 1932-02-28 Sex: female  REASON FOR VISIT: Follow-up after mesenteric intervention (celiac stent angioplasty)  HPI: Jessica [name] is a 87 y.o. female that presents for follow-up after recent mesenteric intervention.  Recently underwent celiac stent angioplasty on 05/15/2021 for an in-stent stenosis.  There was also question of a high-grade SMA stenosis and on angiography we confirmed that this was not a significant stenosis.  We used a Celt closure device in the left common femoral artery and ultimately she had a complication of the closure device and developed a left common femoral occlusion.  She subsequently underwent a left common femoral endarterectomy with bovine patch with findings of a dissection on 05/20/2021.    Today she wants to make sure that her left groin is healing after endarterectomy.  She does have some ongoing numbness in both feet.  Does not sound like her feet keep her awake at night.  She has no active wounds.  She is on gabapentin already.  No abdominal pain after celiac angioplasty.  Past Medical History:  Diagnosis Date   Anemia    Arthritis    "thumbs" (06/27/2015)   Bilateral renal artery stenosis (HCC)    Cataract    Chronic bronchitis (HCC)    Chronic lower back pain    COPD (chronic obstructive pulmonary disease) (HCC)    GERD (gastroesophageal reflux disease)    Headache    "probably weekly" (06/27/2015)   Hypertension    Hypertriglyceridemia    Migraine    "maybe monthly" (06/27/2015)   Pneumonia     Past Surgical History:  Procedure Laterality Date   BALLOON DILATION  1980's?   "for renal stenosis"   CATARACT EXTRACTION, BILATERAL     ENDARTERECTOMY FEMORAL Right 05/15/2020   Procedure: Right Common Femoral Endarterectomy;  Surgeon: Marty Heck, MD;  Location: [facility] OR;  Service: Vascular;  Laterality: Right;   FEMORAL ARTERY EXPLORATION Left 05/20/2021   Procedure: LEFT LEG THROMBECTOMY WITH LEFT  FEMORAL  ENDARTERECTOMY AND PATCH ANGIOPLASTY;  Surgeon: Rosetta Posner, MD;  Location: [facility] OR;  Service: Vascular;  Laterality: Left;   PATCH ANGIOPLASTY Right 05/15/2020   Procedure: Common Femoral Artery Patch Angioplasty using XenoSure Bovine Pericardial Patch;  Surgeon: Marty Heck, MD;  Location: [place];  Service: Vascular;  Laterality: Right;   PERIPHERAL VASCULAR BALLOON ANGIOPLASTY  05/15/2021   Procedure: PERIPHERAL VASCULAR BALLOON ANGIOPLASTY;  Surgeon: Marty Heck, MD;  Location: [place] [facility];  Service: Cardiovascular;;   Celiac   PERIPHERAL VASCULAR INTERVENTION  05/15/2020   Procedure: PERIPHERAL VASCULAR INTERVENTION;  Surgeon: Marty Heck, MD;  Location: [place] [facility];  Service: Cardiovascular;;  Celiac   RECONSTRUCTION OF EYELID Bilateral 01/2016   Upper and lower eyelid   RENAL ANGIOPLASTY     TOE SURGERY Bilateral    "straightened big toe"   TONSILLECTOMY  ~ [place] N/A 05/15/2020   Procedure: MESENTERIC  ANGIOGRAPHY;  Surgeon: Marty Heck, MD;  Location: [place] [facility];  Service: Cardiovascular;  Laterality: N/A;   VISCERAL ANGIOGRAPHY N/A 05/15/2021   Procedure: MESENTRIC ANGIOGRAPHY;  Surgeon: Marty Heck, MD;  Location: [place] [facility];  Service: Cardiovascular;  Laterality: N/A;    Family History  Problem Relation Age of Onset   Hypertension Brother    Hyperlipidemia Brother    Parkinsonism Father    Stroke Maternal Grandfather  Colon cancer Neg Hx    Colon polyps Neg Hx    Stomach cancer Neg Hx     SOCIAL HISTORY: Social History   Tobacco Use   Smoking status: Former    Packs/day: 0.50    Years: 20.00    Pack years: 10.00    Types: Cigarettes   Smokeless tobacco: Never   Tobacco comments:    'quit smoking in the 1990's?"  Substance Use Topics   Alcohol use: Yes    Alcohol/week: 0.0 - 1.0 standard drinks    Comment: rarely    No Known  Allergies  Current Outpatient Medications  Medication Sig Dispense Refill   albuterol (VENTOLIN HFA) 108 (90 Base) MCG/ACT inhaler Inhale 2 puffs into the lungs every 6 (six) hours as needed for wheezing or shortness of breath.     amLODipine (NORVASC) 5 MG tablet Take 5 mg by mouth daily.     aspirin EC 81 MG tablet Take 1 tablet (81 mg total) by mouth daily. Swallow whole. 150 tablet 2   atorvastatin (LIPITOR) 20 MG tablet TAKE 1 TABLET BY MOUTH EVERY DAY (Patient taking differently: Take 20 mg by mouth daily.) 90 tablet 3   clopidogrel (PLAVIX) 75 MG tablet Take 1 tablet (75 mg total) by mouth daily. 30 tablet 0   Fluticasone-Umeclidin-Vilant (TRELEGY ELLIPTA) 100-62.5-25 MCG/INH AEPB Inhale 1 puff into the lungs daily. (Patient taking differently: Inhale 1 puff into the lungs every evening.) 2 each 0   gabapentin (NEURONTIN) 100 MG capsule Take 100 mg by mouth at bedtime.     HYDROcodone-acetaminophen (NORCO) 10-325 MG tablet Take 1 tablet by mouth 3 (three) times daily as needed (pain).     ipratropium (ATROVENT) 0.03 % nasal spray Place 2 sprays into both nostrils every 12 (twelve) hours. (Patient taking differently: Place 2 sprays into both nostrils 2 (two) times daily as needed for rhinitis.) 30 mL 12   losartan (COZAAR) 100 MG tablet Take 100 mg by mouth every evening.     mirtazapine (REMERON) 15 MG tablet Take 15 mg by mouth at bedtime.     montelukast (SINGULAIR) 10 MG tablet Take 10 mg by mouth at bedtime.   6   Multiple Vitamins-Minerals (MULTIVITAMIN PO) Take 1 tablet by mouth daily.      Multiple Vitamins-Minerals (PRESERVISION AREDS 2) CAPS Take 1 capsule by mouth 2 (two) times daily.     omeprazole (PRILOSEC) 40 MG capsule TAKE 1 CAPSULE (40 MG TOTAL) BY MOUTH IN THE MORNING AND AT BEDTIME. 180 capsule 1   propranolol ER (INDERAL LA) 120 MG 24 hr capsule Take 120 mg by mouth daily.  3   No current facility-administered medications for this visit.    REVIEW OF SYSTEMS:  [X]   denotes positive finding, [ ]  denotes negative finding Cardiac  Comments:  Chest pain or chest pressure:    Shortness of breath upon exertion:    Short of breath when lying flat:    Irregular heart rhythm:        Vascular    Pain in calf, thigh, or hip brought on by ambulation:    Pain in feet at night that wakes you up from your sleep:     Blood clot in your veins:    Leg swelling:         Pulmonary    Oxygen at home:    Productive cough:     Wheezing:         Neurologic    Sudden weakness  in arms or legs:     Sudden numbness in arms or legs:     Sudden onset of difficulty speaking or slurred speech:    Temporary loss of vision in one eye:     Problems with dizziness:         Gastrointestinal    Blood in stool:     Vomited blood:         Genitourinary    Burning when urinating:     Blood in urine:        Psychiatric    Major depression:         Hematologic    Bleeding problems:    Problems with blood clotting too easily:        Skin    Rashes or ulcers:        Constitutional    Fever or chills:      PHYSICAL EXAM: Vitals:   06/17/21 0844  BP: (!) 148/72  Pulse: 68  Resp: 14  Temp: 97.6 F (36.4 C)  TempSrc: Temporal  SpO2: 95%  Weight: 88 lb (39.9 kg)  Height: 5\' 2"  (1.575 m)    GENERAL: The patient is a well-nourished female, in no acute distress. The vital signs are documented above. CARDIAC: There is a regular rate and rhythm.  VASCULAR:  Left groin incision healing appropriately Left femoral pulse palpable Brisk dorsalis pedis and posterior tibial signals bilaterally  DATA:   Repeat duplex today shows widely patent celiac stent after recent celiac intervention.  Still has elevated velocity in SMA but no critical SMA stenosis on recent angiogram.   Assessment/Plan:  87 year old female underwent celiac stent angioplasty on 05/15/2021 for in-stent stenosis with Celt closure of the left common femoral artery complicated by dissection and  subsequent ischemic left leg.  She underwent left common femoral endarterectomy with bovine patch on 05/20/2021.  Left groin looks good and is healing appropriately.  She still has brisk Doppler signals in the left foot.  Offered her reassurance.  In addition discussed that her mesenteric duplex today the celiac stent that had a recurrent high-grade stenosis is now widely patent after intervention.  Her SMA velocities have been elevated but on angiogram there is no critical stenosis of the SMA which I discussed with her again today.  I will have her see me again in 6 months with mesenteric duplex.  We are going to make sure that she gets referral to physical therapy.  Discussed that if she has any worsening symptoms in her feet to let me know.  She has no wounds and no evidence of rest pain.  Sleeps through the night and her legs are just numb during the day when she is walking.  Not sure if she has some underlying neuropathy as well.   Marty Heck, MD [facility] [place] Office: [phone]

## 2021-06-18 ENCOUNTER — Other Ambulatory Visit: Payer: Self-pay

## 2021-06-18 DIAGNOSIS — K551 Chronic vascular disorders of intestine: Secondary | ICD-10-CM

## 2021-06-24 NOTE — Progress Notes (Signed)
   Jessica [name] [birth year] TD:[id number]   History:  87 y.o. G1P1001 presents for breast and pelvic exam. She has complaints of vaginal dryness that causes irritation and burning. She is sexually active occasionally and experiences severe pain. She used vaginal estrogen up until a couple of years ago and would like to restart. Postmenopausal - no HRT.  S/P 1973 TAH for menorrhagia.  Normal pap and mammogram history. HTN, HLD, osteopenia managed by PCP, sees cardiology as needed.   Gynecologic History Patient's last menstrual period was 11/30/1976.   Contraception: status post hysterectomy  Health Maintenance Last Pap: No longer screening per guidelines Last mammogram: 2022 Results were: Normal Last colonoscopy: 2017 Last Dexa: 2016. Results were: T-score - 1.6, FRAX 9.7% / 2.5%  Past medical history, past surgical history, family history and social history were all reviewed and documented in the EPIC chart. Widowed.   ROS:  A ROS was performed and pertinent positives and negatives are included.  Exam:  Vitals:   06/25/21 1138  BP: 116/74  Weight: 88 lb (39.9 kg)  Height: 5' (1.524 m)   Body mass index is 17.19 kg/m.  General appearance:  Normal Thyroid:  Symmetrical, normal in size, without palpable masses or nodularity. Respiratory  Auscultation:  Clear without wheezing or rhonchi Cardiovascular  Auscultation:  Regular rate, without rubs, murmurs or gallops  Edema/varicosities:  Not grossly evident Abdominal  Soft,nontender, without masses, guarding or rebound.  Liver/spleen:  No organomegaly noted  Hernia:  None appreciated  Skin  Inspection:  Grossly normal Breasts: Examined lying and sitting.   Right: Without masses, retractions, nipple discharge or axillary adenopathy.   Left: Without masses, retractions, nipple discharge or axillary adenopathy. Genitourinary   Inguinal/mons:  Normal without inguinal adenopathy  External genitalia:  Atrophic  changes  BUS/Urethra/Skene's glands:  Normal  Vagina:  Atrophic changes  Cervix:  Absent  Uterus:  Absent  Adnexa/parametria:     Rt: Normal in size, without masses or tenderness.   Lt: Normal in size, without masses or tenderness.  Anus and perineum: Normal  Digital rectal exam: Normal sphincter tone without palpated masses or tenderness  Patient informed chaperone available to be present for breast and pelvic exam. Patient has requested no chaperone to be present. Patient has been advised what will be completed during breast and pelvic exam.   Assessment/Plan:  87 y.o. G1P1001 for breast and pelvic exam.   Well female exam with routine gynecological exam - Education provided on SBEs, importance of preventative screenings, current guidelines, high calcium diet, regular exercise, and multivitamin daily. Labs with PCP.   Postmenopausal - no HRT.   History of total vaginal hysterectomy (TVH) - 1973 for menorrhagia.  Osteopenia of multiple sites - T-score -1.6 in 2016. Managed by PCP. Continue vitamin D supplement and regular exercise.   Atrophic vaginitis - Plan: estradiol (ESTRACE VAGINAL) 0.1 MG/GM vaginal cream twice weekly. She is aware of risk for systemic absorption increasing risk of blood clots, heart attack, stroke, and breast cancer, She would like to continue.   Screening for cervical cancer - Normal Pap history. No longer screening per guidelines.   Screening for breast cancer - Normal mammogram history.  Continue annual screenings.  Normal breast exam today.  Screening for colon cancer - 2017 colonoscopy. Will repeat at GI's recommended interval.   Return in 1 year for breast and pelvic exam.  Tamela Gammon DNP, 12:01 PM 06/25/2021

## 2021-06-25 ENCOUNTER — Encounter: Payer: Self-pay | Admitting: Nurse Practitioner

## 2021-06-25 ENCOUNTER — Ambulatory Visit (INDEPENDENT_AMBULATORY_CARE_PROVIDER_SITE_OTHER): Payer: Medicare Other | Admitting: Nurse Practitioner

## 2021-06-25 ENCOUNTER — Other Ambulatory Visit: Payer: Self-pay

## 2021-06-25 VITALS — BP 116/74 | Ht 60.0 in | Wt 88.0 lb

## 2021-06-25 DIAGNOSIS — M8589 Other specified disorders of bone density and structure, multiple sites: Secondary | ICD-10-CM

## 2021-06-25 DIAGNOSIS — Z90710 Acquired absence of both cervix and uterus: Secondary | ICD-10-CM

## 2021-06-25 DIAGNOSIS — N952 Postmenopausal atrophic vaginitis: Secondary | ICD-10-CM

## 2021-06-25 DIAGNOSIS — Z01419 Encounter for gynecological examination (general) (routine) without abnormal findings: Secondary | ICD-10-CM | POA: Diagnosis not present

## 2021-06-25 DIAGNOSIS — Z780 Asymptomatic menopausal state: Secondary | ICD-10-CM | POA: Diagnosis not present

## 2021-06-25 MED ORDER — ESTRADIOL 0.1 MG/GM VA CREA: 1.0000 g | TOPICAL_CREAM | VAGINAL | 12 refills | Status: DC

## 2021-06-26 ENCOUNTER — Telehealth: Payer: Self-pay

## 2021-06-26 NOTE — Telephone Encounter (Signed)
Patient was seen in office on 7/19. It was recommended patient try physical therapy to help with her symptoms. She calls today to report swelling in her feet after she does exercises on her own. She denies any wounds, temperature change, or rest pain. Has continued numbness. Says she "lives alone and can't carry on like this." Patient sounds very anxious, and would like confirmation that she is okay and asks what we would recommend if physical therapy doesn't help. Advised patient that her symptoms did not sound limb threatening, that she should elevate her legs above the level of her heart and give PT a try. If it doesn't not help, we can schedule a sooner follow up.Patient verbalizes understanding.

## 2021-07-07 DIAGNOSIS — H353112 Nonexudative age-related macular degeneration, right eye, intermediate dry stage: Secondary | ICD-10-CM | POA: Diagnosis not present

## 2021-07-07 DIAGNOSIS — H353221 Exudative age-related macular degeneration, left eye, with active choroidal neovascularization: Secondary | ICD-10-CM | POA: Diagnosis not present

## 2021-07-10 ENCOUNTER — Encounter: Payer: Self-pay | Admitting: Physical Therapy

## 2021-07-10 ENCOUNTER — Ambulatory Visit (INDEPENDENT_AMBULATORY_CARE_PROVIDER_SITE_OTHER): Payer: Medicare Other | Admitting: Physical Therapy

## 2021-07-10 ENCOUNTER — Other Ambulatory Visit: Payer: Self-pay

## 2021-07-10 DIAGNOSIS — M6281 Muscle weakness (generalized): Secondary | ICD-10-CM | POA: Diagnosis not present

## 2021-07-10 DIAGNOSIS — M79605 Pain in left leg: Secondary | ICD-10-CM | POA: Diagnosis not present

## 2021-07-10 DIAGNOSIS — R29898 Other symptoms and signs involving the musculoskeletal system: Secondary | ICD-10-CM

## 2021-07-10 NOTE — Patient Instructions (Signed)
Access Code: [email or web address] URL: [email or web address] Date: 07/10/2021 Prepared by: Faustino Congress  Exercises Supine Bridge - 2 x daily - 7 x weekly - 1 sets - 10 reps - 5 sec hold Sit to Stand - 2 x daily - 7 x weekly - 10 reps - 1 sets Mini Squat with Counter Support - 2 x daily - 7 x weekly - 3 sets - 10 reps
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
## 2021-07-15 ENCOUNTER — Encounter: Payer: Medicare Other | Admitting: Physical Therapy

## 2021-07-23 ENCOUNTER — Encounter: Payer: Medicare Other | Admitting: Physical Therapy

## 2021-07-29 ENCOUNTER — Encounter: Payer: Medicare Other | Admitting: Physical Therapy

## 2021-07-31 ENCOUNTER — Ambulatory Visit (INDEPENDENT_AMBULATORY_CARE_PROVIDER_SITE_OTHER): Payer: Medicare Other | Admitting: Physical Therapy

## 2021-07-31 ENCOUNTER — Other Ambulatory Visit: Payer: Self-pay

## 2021-07-31 ENCOUNTER — Encounter: Payer: Self-pay | Admitting: Physical Therapy

## 2021-07-31 DIAGNOSIS — M6281 Muscle weakness (generalized): Secondary | ICD-10-CM

## 2021-07-31 DIAGNOSIS — R29898 Other symptoms and signs involving the musculoskeletal system: Secondary | ICD-10-CM | POA: Diagnosis not present

## 2021-07-31 DIAGNOSIS — M79605 Pain in left leg: Secondary | ICD-10-CM
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
## 2021-08-06 ENCOUNTER — Ambulatory Visit (INDEPENDENT_AMBULATORY_CARE_PROVIDER_SITE_OTHER): Payer: Medicare Other | Admitting: Physical Therapy

## 2021-08-06 ENCOUNTER — Other Ambulatory Visit: Payer: Self-pay

## 2021-08-06 ENCOUNTER — Encounter: Payer: Self-pay | Admitting: Physical Therapy

## 2021-08-06 DIAGNOSIS — M6281 Muscle weakness (generalized): Secondary | ICD-10-CM

## 2021-08-06 DIAGNOSIS — R29898 Other symptoms and signs involving the musculoskeletal system: Secondary | ICD-10-CM

## 2021-08-06 DIAGNOSIS — M79605 Pain in left leg: Secondary | ICD-10-CM
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
## 2021-08-13 ENCOUNTER — Other Ambulatory Visit: Payer: Self-pay

## 2021-08-13 ENCOUNTER — Ambulatory Visit (INDEPENDENT_AMBULATORY_CARE_PROVIDER_SITE_OTHER): Payer: Medicare Other | Admitting: Physical Therapy

## 2021-08-13 ENCOUNTER — Encounter: Payer: Self-pay | Admitting: Physical Therapy

## 2021-08-13 DIAGNOSIS — M6281 Muscle weakness (generalized): Secondary | ICD-10-CM

## 2021-08-13 DIAGNOSIS — M79605 Pain in left leg: Secondary | ICD-10-CM

## 2021-08-13 DIAGNOSIS — R29898 Other symptoms and signs involving the musculoskeletal system: Secondary | ICD-10-CM
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
## 2021-08-18 ENCOUNTER — Other Ambulatory Visit: Payer: Self-pay | Admitting: Gastroenterology

## 2021-08-18 DIAGNOSIS — R1013 Epigastric pain: Secondary | ICD-10-CM

## 2021-08-21 ENCOUNTER — Encounter: Payer: Medicare Other | Admitting: Physical Therapy

## 2021-08-28 ENCOUNTER — Encounter: Payer: Medicare Other | Admitting: Physical Therapy

## 2021-09-01 DIAGNOSIS — H353221 Exudative age-related macular degeneration, left eye, with active choroidal neovascularization: Secondary | ICD-10-CM | POA: Diagnosis not present

## 2021-09-01 DIAGNOSIS — H353112 Nonexudative age-related macular degeneration, right eye, intermediate dry stage: Secondary | ICD-10-CM | POA: Diagnosis not present

## 2021-09-04 ENCOUNTER — Encounter: Payer: Medicare Other | Admitting: Physical Therapy

## 2021-09-22 ENCOUNTER — Telehealth: Payer: Self-pay

## 2021-09-22 ENCOUNTER — Other Ambulatory Visit: Payer: Self-pay | Admitting: *Deleted

## 2021-09-22 DIAGNOSIS — M5459 Other low back pain: Secondary | ICD-10-CM | POA: Diagnosis not present

## 2021-09-22 DIAGNOSIS — I739 Peripheral vascular disease, unspecified: Secondary | ICD-10-CM

## 2021-09-22 DIAGNOSIS — Z79891 Long term (current) use of opiate analgesic: Secondary | ICD-10-CM | POA: Diagnosis not present

## 2021-09-22 DIAGNOSIS — M5416 Radiculopathy, lumbar region: Secondary | ICD-10-CM | POA: Diagnosis not present

## 2021-09-22 DIAGNOSIS — R200 Anesthesia of skin: Secondary | ICD-10-CM | POA: Diagnosis not present

## 2021-09-22 DIAGNOSIS — G894 Chronic pain syndrome: Secondary | ICD-10-CM | POA: Diagnosis not present

## 2021-09-22 DIAGNOSIS — I70202 Unspecified atherosclerosis of native arteries of extremities, left leg: Secondary | ICD-10-CM

## 2021-09-22 NOTE — Telephone Encounter (Signed)
Patient is s/p left leg thrombectomy with femoral endarterectomy on 05/20/2021. She went to a routine orthopedic appt this morning and reported a 3-4 day history of rest pain in her left lower extremity. She calls now to ask for an appointment concerning this. She denies any open wounds, says the pain is worse around her toes and there is also some numbness. Added patient to schedule with LLE duplex and ABIs.

## 2021-09-23 ENCOUNTER — Ambulatory Visit (INDEPENDENT_AMBULATORY_CARE_PROVIDER_SITE_OTHER)
Admission: RE | Admit: 2021-09-23 | Discharge: 2021-09-23 | Disposition: A | Discharge status: Home / Self Care | Payer: Medicare Other | Source: Ambulatory Visit | Attending: Vascular Surgery | Admitting: Vascular Surgery

## 2021-09-23 ENCOUNTER — Ambulatory Visit (INDEPENDENT_AMBULATORY_CARE_PROVIDER_SITE_OTHER): Payer: Medicare Other | Admitting: Vascular Surgery

## 2021-09-23 ENCOUNTER — Encounter: Payer: Self-pay | Admitting: Vascular Surgery

## 2021-09-23 ENCOUNTER — Ambulatory Visit (HOSPITAL_COMMUNITY)
Admission: RE | Admit: 2021-09-23 | Discharge: 2021-09-23 | Disposition: A | Discharge status: Home / Self Care | Payer: Medicare Other | Source: Ambulatory Visit | Attending: Vascular Surgery | Admitting: Vascular Surgery

## 2021-09-23 ENCOUNTER — Other Ambulatory Visit: Payer: Self-pay

## 2021-09-23 VITALS — BP 142/77 | HR 58 | Temp 97.4°F | Resp 14 | Ht 62.0 in | Wt 87.0 lb

## 2021-09-23 DIAGNOSIS — I739 Peripheral vascular disease, unspecified: Secondary | ICD-10-CM | POA: Insufficient documentation

## 2021-09-23 DIAGNOSIS — I70202 Unspecified atherosclerosis of native arteries of extremities, left leg: Secondary | ICD-10-CM

## 2021-09-23 NOTE — Progress Notes (Signed)
Patient name: Jessica [name] MRN: [id number] DOB: August 04, 1932 Sex: female  REASON FOR VISIT: Evaluate new left foot pain, concern for rest pain  HPI: Jessica [name] is a 87 y.o. female that presents for triage visit to evaluate new left foot pain.  She states about a week ago she started having pain in the lateral foot.  She also had some numbness in the toes.  She was seen by orthopedic surgeon who recommended to follow-up with vascular.  She states the pain is intermittent.  It does not hurt when she walks or during the day.  Usually worse at night.  She states overall pain is some better over past few days.    She is well-known to our practice.  Recently underwent celiac stent angioplasty on 05/15/2021 for an in-stent stenosis.  We used a Celt closure device in the left common femoral artery and ultimately she had a complication of the closure device and developed a left common femoral occlusion.  She subsequently underwent a left common femoral endarterectomy with bovine patch with findings of a dissection on 05/20/2021.      Past Medical History:  Diagnosis Date   Anemia    Arthritis    "thumbs" (06/27/2015)   Bilateral renal artery stenosis (HCC)    Cataract    Chronic bronchitis (HCC)    Chronic lower back pain    COPD (chronic obstructive pulmonary disease) (HCC)    GERD (gastroesophageal reflux disease)    Headache    "probably weekly" (06/27/2015)   Hypertension    Hypertriglyceridemia    Migraine    "maybe monthly" (06/27/2015)   Pneumonia     Past Surgical History:  Procedure Laterality Date   BALLOON DILATION  1980's?   "for renal stenosis"   CATARACT EXTRACTION, BILATERAL     ENDARTERECTOMY FEMORAL Right 05/15/2020   Procedure: Right Common Femoral Endarterectomy;  Surgeon: Marty Heck, MD;  Location: [facility] OR;  Service: Vascular;  Laterality: Right;   FEMORAL ARTERY EXPLORATION Left 05/20/2021   Procedure: LEFT LEG THROMBECTOMY WITH LEFT FEMORAL  ENDARTERECTOMY AND  PATCH ANGIOPLASTY;  Surgeon: Rosetta Posner, MD;  Location: [facility] OR;  Service: Vascular;  Laterality: Left;   PATCH ANGIOPLASTY Right 05/15/2020   Procedure: Common Femoral Artery Patch Angioplasty using XenoSure Bovine Pericardial Patch;  Surgeon: Marty Heck, MD;  Location: [place];  Service: Vascular;  Laterality: Right;   PERIPHERAL VASCULAR BALLOON ANGIOPLASTY  05/15/2021   Procedure: PERIPHERAL VASCULAR BALLOON ANGIOPLASTY;  Surgeon: Marty Heck, MD;  Location: [facility];  Service: Cardiovascular;;   Celiac   PERIPHERAL VASCULAR INTERVENTION  05/15/2020   Procedure: PERIPHERAL VASCULAR INTERVENTION;  Surgeon: Marty Heck, MD;  Location: [facility];  Service: Cardiovascular;;  Celiac   RECONSTRUCTION OF EYELID Bilateral 01/2016   Upper and lower eyelid   RENAL ANGIOPLASTY     TOE SURGERY Bilateral    "straightened big toe"   TONSILLECTOMY  ~ [place] N/A 05/15/2020   Procedure: MESENTERIC  ANGIOGRAPHY;  Surgeon: Marty Heck, MD;  Location: [facility];  Service: Cardiovascular;  Laterality: N/A;   VISCERAL ANGIOGRAPHY N/A 05/15/2021   Procedure: MESENTRIC ANGIOGRAPHY;  Surgeon: Marty Heck, MD;  Location: [facility];  Service: Cardiovascular;  Laterality: N/A;    Family History  Problem Relation Age of Onset   Parkinsonism Father    Cancer Brother  Bladder   Hypertension Brother    Hyperlipidemia Brother    Stroke Maternal Grandfather    Colon cancer Neg Hx    Colon polyps Neg Hx    Stomach cancer Neg Hx     SOCIAL HISTORY: Social History   Tobacco Use   Smoking status: Former    Packs/day: 0.50    Years: 20.00    Pack years: 10.00    Types: Cigarettes   Smokeless tobacco: Never   Tobacco comments:    'quit smoking in the 1990's?"  Substance Use Topics   Alcohol use: Yes    Alcohol/week: 0.0 - 1.0 standard drinks    Comment: rarely    No Known  Allergies  Current Outpatient Medications  Medication Sig Dispense Refill   albuterol (VENTOLIN HFA) 108 (90 Base) MCG/ACT inhaler Inhale 2 puffs into the lungs every 6 (six) hours as needed for wheezing or shortness of breath.     amLODipine (NORVASC) 5 MG tablet Take 5 mg by mouth daily.     aspirin EC 81 MG tablet Take 1 tablet (81 mg total) by mouth daily. Swallow whole. 150 tablet 2   atorvastatin (LIPITOR) 20 MG tablet TAKE 1 TABLET BY MOUTH EVERY DAY (Patient taking differently: Take 20 mg by mouth daily.) 90 tablet 3   clopidogrel (PLAVIX) 75 MG tablet Take 1 tablet (75 mg total) by mouth daily. 30 tablet 0   estradiol (ESTRACE VAGINAL) 0.1 MG/GM vaginal cream Place 1 g vaginally 2 (two) times a week. 42.5 g 12   Fluticasone-Umeclidin-Vilant (TRELEGY ELLIPTA) 100-62.5-25 MCG/INH AEPB Inhale 1 puff into the lungs daily. (Patient taking differently: Inhale 1 puff into the lungs every evening.) 2 each 0   gabapentin (NEURONTIN) 100 MG capsule Take 100 mg by mouth at bedtime.     HYDROcodone-acetaminophen (NORCO) 10-325 MG tablet Take 1 tablet by mouth 3 (three) times daily as needed (pain).     ipratropium (ATROVENT) 0.03 % nasal spray Place 2 sprays into both nostrils every 12 (twelve) hours. (Patient taking differently: Place 2 sprays into both nostrils 2 (two) times daily as needed for rhinitis.) 30 mL 12   losartan (COZAAR) 100 MG tablet Take 100 mg by mouth every evening.     mirtazapine (REMERON) 15 MG tablet Take 15 mg by mouth at bedtime.     montelukast (SINGULAIR) 10 MG tablet Take 10 mg by mouth at bedtime.   6   Multiple Vitamins-Minerals (MULTIVITAMIN PO) Take 1 tablet by mouth daily.      Multiple Vitamins-Minerals (PRESERVISION AREDS 2) CAPS Take 1 capsule by mouth 2 (two) times daily.     omeprazole (PRILOSEC) 40 MG capsule TAKE 1 CAPSULE (40 MG TOTAL) BY MOUTH IN THE MORNING AND AT BEDTIME. 180 capsule 1   propranolol ER (INDERAL LA) 120 MG 24 hr capsule Take 120 mg by mouth  daily.  3   No current facility-administered medications for this visit.    REVIEW OF SYSTEMS:  [X]  denotes positive finding, [ ]  denotes negative finding Cardiac  Comments:  Chest pain or chest pressure:    Shortness of breath upon exertion:    Short of breath when lying flat:    Irregular heart rhythm:        Vascular    Pain in calf, thigh, or hip brought on by ambulation:    Pain in feet at night that wakes you up from your sleep:     Blood clot in your veins:    Leg  swelling:         Pulmonary    Oxygen at home:    Productive cough:     Wheezing:         Neurologic    Sudden weakness in arms or legs:     Sudden numbness in arms or legs:     Sudden onset of difficulty speaking or slurred speech:    Temporary loss of vision in one eye:     Problems with dizziness:         Gastrointestinal    Blood in stool:     Vomited blood:         Genitourinary    Burning when urinating:     Blood in urine:        Psychiatric    Major depression:         Hematologic    Bleeding problems:    Problems with blood clotting too easily:        Skin    Rashes or ulcers:        Constitutional    Fever or chills:      PHYSICAL EXAM: Vitals:   09/23/21 0939  BP: (!) 142/77  Pulse: (!) 58  Resp: 14  Temp: (!) 97.4 F (36.3 C)  TempSrc: Temporal  SpO2: 95%  Weight: 87 lb (39.5 kg)  Height: 5\' 2"  (1.575 m)    GENERAL: The patient is a well-nourished female, in no acute distress. The vital signs are documented above. CARDIAC: There is a regular rate and rhythm.  VASCULAR:  Left femoral pulse palpable Left DP monophasic Left PT sounds much more brisk Foot is warm  DATA:   Left lower extremity arterial duplex shows the common femoral is patent at site of previous endarterectomy.  There is a moderate SFA stenosis and a high-grade popliteal stenosis.  ABI 0.66 on the right and 0.64 on the left  Assessment/Plan:  87 year old female presents for evaluation of new  pain in the left foot over the past several days.  She certainly has infrainguinal vascular disease that could be the etiology for her pain.  That being said, her common femoral endarterectomy site remains patent and she has good femoral pulse that is palpable and she has pretty good Doppler signals in the foot with an ABI of 0.66.  Every time in the past when she has transfemoral access she has had complications.  She states this pain has been improving over the last several days.  Not exactly clear on the etiology.  I will see her again in a month to see how she progresses before recommending a lower extremity arteriogram with intervention.  Last time she presented to our office she had a profoundly ischemic foot with an ABI of 0 and that is certainly not the case today.  Marty Heck, MD [facility] Office: [phone]

## 2021-09-28 ENCOUNTER — Other Ambulatory Visit: Payer: Self-pay | Admitting: Pulmonary Disease

## 2021-09-30 ENCOUNTER — Ambulatory Visit (INDEPENDENT_AMBULATORY_CARE_PROVIDER_SITE_OTHER): Payer: Medicare Other | Admitting: Pulmonary Disease

## 2021-09-30 ENCOUNTER — Encounter: Payer: Self-pay | Admitting: Pulmonary Disease

## 2021-09-30 ENCOUNTER — Other Ambulatory Visit: Payer: Self-pay

## 2021-09-30 VITALS — BP 114/68 | HR 73 | Ht 62.0 in | Wt 87.6 lb

## 2021-09-30 DIAGNOSIS — J310 Chronic rhinitis: Secondary | ICD-10-CM | POA: Diagnosis not present

## 2021-09-30 DIAGNOSIS — J439 Emphysema, unspecified: Secondary | ICD-10-CM | POA: Diagnosis not present

## 2021-09-30 MED ORDER — TRELEGY ELLIPTA 100-62.5-25 MCG/ACT IN AEPB: 1.0000 | INHALATION_SPRAY | Freq: Every day | RESPIRATORY_TRACT | 0 refills | Status: DC

## 2021-09-30 MED ORDER — ALBUTEROL SULFATE (2.5 MG/3ML) 0.083% IN NEBU: 2.5000 mg | INHALATION_SOLUTION | Freq: Four times a day (QID) | RESPIRATORY_TRACT | 12 refills | Status: DC | PRN

## 2021-09-30 NOTE — Patient Instructions (Addendum)
Continue trelegy ellipta 1 puff daily  Use albuterol inhaler 1-2 puffs as needed every 4-6 hours.   Continue to use ipratropium nasal spray as needed for your runny nose.   We will order you a nebulizer machine and supplies along with albuterol nebulizer solution to be use in the morning time followed by flutter valve. We will also order you a flutter valve.

## 2021-09-30 NOTE — Progress Notes (Signed)
Synopsis: Return visit for emphysema  Subjective:   PATIENT ID: Jessica [name] GENDER: female DOB: 07/16/1932, MRN: [id number]  HPI  Chief Complaint  Patient presents with   Follow-up    6 mo f/u for emphysema. States the Trelegy has been working for her.    Jessica [name] is an 87 year old woman, former smoker with peripheral vascular disease, hypertension and emphysema who returns to pulmonary clinic for follow up  She continues on Trelegy Ellipta daily and as needed albuterol. She is using albuterol infrequently. She is using ipratropium nasal spray as needed for runny nose with good efficacy.   She complains of sputum production in the morning time. She has not used her albuterol during these times. She does not have a flutter valve or nebulizer machine at home.   Past Medical History:  Diagnosis Date   Anemia    Arthritis    "thumbs" (06/27/2015)   Bilateral renal artery stenosis (HCC)    Cataract    Chronic bronchitis (HCC)    Chronic lower back pain    COPD (chronic obstructive pulmonary disease) (HCC)    GERD (gastroesophageal reflux disease)    Headache    "probably weekly" (06/27/2015)   Hypertension    Hypertriglyceridemia    Migraine    "maybe monthly" (06/27/2015)   Pneumonia      Family History  Problem Relation Age of Onset   Parkinsonism Father    Cancer Brother        Bladder   Hypertension Brother    Hyperlipidemia Brother    Stroke Maternal Grandfather    Colon cancer Neg Hx    Colon polyps Neg Hx    Stomach cancer Neg Hx      Social History   Socioeconomic History   Marital status: Widowed    Spouse name: Not on file   Number of children: 1   Years of education: Not on file   Highest education level: Not on file  Occupational History   Occupation: retired  Tobacco Use   Smoking status: Former    Packs/day: 0.50    Years: 20.00    Pack years: 10.00    Types: Cigarettes   Smokeless tobacco: Never   Tobacco comments:    'quit smoking in  the 1990's?"  Vaping Use   Vaping Use: Never used  Substance and Sexual Activity   Alcohol use: Yes    Alcohol/week: 0.0 - 1.0 standard drinks    Comment: rarely   Drug use: No   Sexual activity: Not Currently    Partners: Male    Birth control/protection: Surgical, Post-menopausal    Comment: hysterectomy  Other Topics Concern   Not on file  Social History Narrative   Not on file   Social Determinants of Health   Financial Resource Strain: Not on file  Food Insecurity: Not on file  Transportation Needs: Not on file  Physical Activity: Not on file  Stress: Not on file  Social Connections: Not on file  Intimate Partner Violence: Not on file     No Known Allergies   Outpatient Medications Prior to Visit  Medication Sig Dispense Refill   albuterol (VENTOLIN HFA) 108 (90 Base) MCG/ACT inhaler Inhale 2 puffs into the lungs every 6 (six) hours as needed for wheezing or shortness of breath.     amLODipine (NORVASC) 5 MG tablet Take 5 mg by mouth daily.     aspirin EC 81 MG tablet Take 1 tablet (81 mg  total) by mouth daily. Swallow whole. 150 tablet 2   atorvastatin (LIPITOR) 20 MG tablet TAKE 1 TABLET BY MOUTH EVERY DAY (Patient taking differently: Take 20 mg by mouth daily.) 90 tablet 3   clopidogrel (PLAVIX) 75 MG tablet Take 1 tablet (75 mg total) by mouth daily. 30 tablet 0   estradiol (ESTRACE VAGINAL) 0.1 MG/GM vaginal cream Place 1 g vaginally 2 (two) times a week. 42.5 g 12   gabapentin (NEURONTIN) 100 MG capsule Take 100 mg by mouth at bedtime.     HYDROcodone-acetaminophen (NORCO) 10-325 MG tablet Take 1 tablet by mouth 3 (three) times daily as needed (pain).     ipratropium (ATROVENT) 0.03 % nasal spray Place 2 sprays into both nostrils every 12 (twelve) hours. (Patient taking differently: Place 2 sprays into both nostrils 2 (two) times daily as needed for rhinitis.) 30 mL 12   losartan (COZAAR) 100 MG tablet Take 100 mg by mouth every evening.     mirtazapine (REMERON)  15 MG tablet Take 15 mg by mouth at bedtime.     montelukast (SINGULAIR) 10 MG tablet Take 10 mg by mouth at bedtime.   6   Multiple Vitamins-Minerals (MULTIVITAMIN PO) Take 1 tablet by mouth daily.      Multiple Vitamins-Minerals (PRESERVISION AREDS 2) CAPS Take 1 capsule by mouth 2 (two) times daily.     omeprazole (PRILOSEC) 40 MG capsule TAKE 1 CAPSULE (40 MG TOTAL) BY MOUTH IN THE MORNING AND AT BEDTIME. 180 capsule 1   propranolol ER (INDERAL LA) 120 MG 24 hr capsule Take 120 mg by mouth daily.  3   TRELEGY ELLIPTA 100-62.5-25 MCG/ACT AEPB INHALE 1 PUFF BY MOUTH EVERY DAY 60 each 6   No facility-administered medications prior to visit.    Review of Systems  Constitutional:  Negative for chills, fever, malaise/fatigue and weight loss.  HENT:         Rhinitis and post nasal drip  Eyes: Negative.   Respiratory:  Positive for cough and sputum production. Negative for hemoptysis, shortness of breath and wheezing.   Cardiovascular:  Negative for chest pain, palpitations, orthopnea, claudication and leg swelling.  Gastrointestinal:  Negative for abdominal pain, heartburn, nausea and vomiting.  Genitourinary: Negative.   Musculoskeletal: Negative.   Neurological:  Negative for dizziness and headaches.  Psychiatric/Behavioral: Negative.     Objective:   Vitals:   09/30/21 1036  BP: 114/68  Pulse: 73  SpO2: 97%  Weight: 87 lb 9.6 oz (39.7 kg)  Height: 5\' 2"  (1.575 m)   Physical Exam Constitutional:      Comments: thin  HENT:     Head: Normocephalic and atraumatic.  Cardiovascular:     Rate and Rhythm: Normal rate and regular rhythm.     Pulses: Normal pulses.     Heart sounds: Normal heart sounds.  Pulmonary:     Effort: Pulmonary effort is normal.     Breath sounds: Normal breath sounds.  Abdominal:     General: Bowel sounds are normal.     Palpations: Abdomen is soft.  Musculoskeletal:     Right lower leg: No edema.     Left lower leg: No edema.  Skin:    General:  Skin is warm and dry.     Capillary Refill: Capillary refill takes less than 2 seconds.  Neurological:     General: No focal deficit present.     Mental Status: She is alert.    CBC    Component Value Date/Time  WBC 7.0 05/21/2021 0138   RBC 3.64 (L) 05/21/2021 0138   HGB 11.2 (L) 05/21/2021 0138   HCT 34.2 (L) 05/21/2021 0138   PLT 203 05/21/2021 0138   MCV 94.0 05/21/2021 0138   MCH 30.8 05/21/2021 0138   MCHC 32.7 05/21/2021 0138   RDW 15.6 (H) 05/21/2021 0138   LYMPHSABS 2.3 06/27/2015 0025   MONOABS 0.6 06/27/2015 0025   EOSABS 0.3 06/27/2015 0025   BASOSABS 0.1 06/27/2015 0025   Chest imaging: CT Chest 07/15/20 1. No CT evidence for acute intrathoracic abnormality. Negative for pneumothorax. 2. Acute angular fracture deformity involving the midportion of the sternal body with mild surrounding edema and hematoma. 3. Suspected acute mild superior endplate deformity at T1, T2 and T3 with possible vertical fracture lucency through the anterior vertebral bodies at T1 and T2. 4. Emphysematous disease  PFT: PFT Results Latest Ref Rng & Units 09/16/2020  FVC-Pre L 1.74  FVC-Predicted Pre % 87  FVC-Post L 1.90  FVC-Predicted Post % 95  Pre FEV1/FVC % % 60  Post FEV1/FCV % % 61  FEV1-Pre L 1.04  FEV1-Predicted Pre % 72  FEV1-Post L 1.15  DLCO uncorrected ml/min/mmHg 4.60  DLCO UNC% % 27  DLCO corrected ml/min/mmHg 4.60  DLCO COR %Predicted % 27  DLVA Predicted % 32   Echo: 06/28/15 - Left ventricle: The cavity size was normal. Wall thickness was    increased in a pattern of moderate LVH. Systolic function was    vigorous. The estimated ejection fraction was in the range of 65%    to 70%. Wall motion was normal; there were no regional wall    motion abnormalities. Doppler parameters are consistent with    abnormal left ventricular relaxation (grade 1 diastolic    dysfunction). Doppler parameters are consistent with high    ventricular filling pressure.     Assessment & Plan:   Pulmonary emphysema, unspecified emphysema type ([place])  Rhinitis, unspecified type  Discussion: Jessica [name] is an 87 year old woman, former smoker with emphysema, peripheral vascular disease and hypertension who returns to pulmonary clinic for follow up.  She has mild obstructive defect and severe diffusion defect on pulmonary function testing. She is to continue on trelegy ellipta daily and as needed albuterol.   We will order her a nebulizer machine and albuterol solution to be used int he morning time for bronchial hygiene to aid in mucous clearance with flutter valve therapy.  She is to continue as needed ipratropium nasal spray for chronic rhinits.    Follow up in 6 months   Freda Jackson, MD [facility] Office: [phone]    Current Outpatient Medications:    albuterol (VENTOLIN HFA) 108 (90 Base) MCG/ACT inhaler, Inhale 2 puffs into the lungs every 6 (six) hours as needed for wheezing or shortness of breath., Disp: , Rfl:    amLODipine (NORVASC) 5 MG tablet, Take 5 mg by mouth daily., Disp: , Rfl:    aspirin EC 81 MG tablet, Take 1 tablet (81 mg total) by mouth daily. Swallow whole., Disp: 150 tablet, Rfl: 2   atorvastatin (LIPITOR) 20 MG tablet, TAKE 1 TABLET BY MOUTH EVERY DAY (Patient taking differently: Take 20 mg by mouth daily.), Disp: 90 tablet, Rfl: 3   clopidogrel (PLAVIX) 75 MG tablet, Take 1 tablet (75 mg total) by mouth daily., Disp: 30 tablet, Rfl: 0   estradiol (ESTRACE VAGINAL) 0.1 MG/GM vaginal cream, Place 1 g vaginally 2 (two) times a week., Disp: 42.5  g, Rfl: 12   gabapentin (NEURONTIN) 100 MG capsule, Take 100 mg by mouth at bedtime., Disp: , Rfl:    HYDROcodone-acetaminophen (NORCO) 10-325 MG tablet, Take 1 tablet by mouth 3 (three) times daily as needed (pain)., Disp: , Rfl:    ipratropium (ATROVENT) 0.03 % nasal spray, Place 2 sprays into both nostrils every 12 (twelve) hours. (Patient taking differently:  Place 2 sprays into both nostrils 2 (two) times daily as needed for rhinitis.), Disp: 30 mL, Rfl: 12   losartan (COZAAR) 100 MG tablet, Take 100 mg by mouth every evening., Disp: , Rfl:    mirtazapine (REMERON) 15 MG tablet, Take 15 mg by mouth at bedtime., Disp: , Rfl:    montelukast (SINGULAIR) 10 MG tablet, Take 10 mg by mouth at bedtime. , Disp: , Rfl: 6   Multiple Vitamins-Minerals (MULTIVITAMIN PO), Take 1 tablet by mouth daily. , Disp: , Rfl:    Multiple Vitamins-Minerals (PRESERVISION AREDS 2) CAPS, Take 1 capsule by mouth 2 (two) times daily., Disp: , Rfl:    omeprazole (PRILOSEC) 40 MG capsule, TAKE 1 CAPSULE (40 MG TOTAL) BY MOUTH IN THE MORNING AND AT BEDTIME., Disp: 180 capsule, Rfl: 1   propranolol ER (INDERAL LA) 120 MG 24 hr capsule, Take 120 mg by mouth daily., Disp: , Rfl: 3   TRELEGY ELLIPTA 100-62.5-25 MCG/ACT AEPB, INHALE 1 PUFF BY MOUTH EVERY DAY, Disp: 60 each, Rfl: 6

## 2021-10-01 ENCOUNTER — Telehealth: Payer: Self-pay | Admitting: Pulmonary Disease

## 2021-10-01 MED ORDER — ALBUTEROL SULFATE (2.5 MG/3ML) 0.083% IN NEBU: 2.5000 mg | INHALATION_SOLUTION | Freq: Four times a day (QID) | RESPIRATORY_TRACT | 12 refills | Status: DC | PRN

## 2021-10-01 NOTE — Telephone Encounter (Signed)
I called the pt and explained that the rx was sent in with the order for the nebulizer machine.   She stated that she got the machine today but no meds.  I explained that they came from 2 different areas and that she should get the meds soon.  She requested that the meds be sent to her local pharmacy and this has been done.  Community message sent to Andee Poles to have her cancel the order for the medications through them.

## 2021-10-07 ENCOUNTER — Telehealth: Payer: Self-pay | Admitting: Pulmonary Disease

## 2021-10-07 MED ORDER — ALBUTEROL SULFATE (2.5 MG/3ML) 0.083% IN NEBU: 2.5000 mg | INHALATION_SOLUTION | Freq: Four times a day (QID) | RESPIRATORY_TRACT | 12 refills | Status: DC | PRN

## 2021-10-07 NOTE — Telephone Encounter (Signed)
Rx for pt's albuterol neb sol was originally sent to the pharmacy for pt as status of print instead normal so this would be sent to pt's pharmacy. I have fixed this and have sent it as normal status so it would go directly to pt's pharmacy.  Called and spoke with pt letting her know this had been done and she verbalized understanding. Nothing further needed.

## 2021-10-14 DIAGNOSIS — N1831 Chronic kidney disease, stage 3a: Secondary | ICD-10-CM | POA: Diagnosis not present

## 2021-10-14 DIAGNOSIS — I10 Essential (primary) hypertension: Secondary | ICD-10-CM | POA: Diagnosis not present

## 2021-10-14 DIAGNOSIS — E785 Hyperlipidemia, unspecified: Secondary | ICD-10-CM | POA: Diagnosis not present

## 2021-10-14 DIAGNOSIS — E871 Hypo-osmolality and hyponatremia: Secondary | ICD-10-CM | POA: Diagnosis not present

## 2021-10-14 DIAGNOSIS — M810 Age-related osteoporosis without current pathological fracture: Secondary | ICD-10-CM | POA: Diagnosis not present

## 2021-10-14 DIAGNOSIS — I701 Atherosclerosis of renal artery: Secondary | ICD-10-CM | POA: Diagnosis not present

## 2021-10-14 DIAGNOSIS — R7301 Impaired fasting glucose: Secondary | ICD-10-CM | POA: Diagnosis not present

## 2021-10-14 DIAGNOSIS — G894 Chronic pain syndrome: Secondary | ICD-10-CM | POA: Diagnosis not present

## 2021-10-14 DIAGNOSIS — J432 Centrilobular emphysema: Secondary | ICD-10-CM | POA: Diagnosis not present

## 2021-10-14 DIAGNOSIS — I700 Atherosclerosis of aorta: Secondary | ICD-10-CM | POA: Diagnosis not present

## 2021-10-14 DIAGNOSIS — I774 Celiac artery compression syndrome: Secondary | ICD-10-CM | POA: Diagnosis not present

## 2021-10-14 DIAGNOSIS — Z23 Encounter for immunization: Secondary | ICD-10-CM | POA: Diagnosis not present

## 2021-10-14 DIAGNOSIS — Z95820 Peripheral vascular angioplasty status with implants and grafts: Secondary | ICD-10-CM | POA: Diagnosis not present

## 2021-10-28 ENCOUNTER — Ambulatory Visit (INDEPENDENT_AMBULATORY_CARE_PROVIDER_SITE_OTHER): Payer: Medicare Other | Admitting: Vascular Surgery

## 2021-10-28 ENCOUNTER — Encounter: Payer: Self-pay | Admitting: Vascular Surgery

## 2021-10-28 ENCOUNTER — Other Ambulatory Visit: Payer: Self-pay

## 2021-10-28 VITALS — BP 115/52 | HR 76 | Temp 97.1°F | Resp 16 | Ht 62.0 in | Wt 87.3 lb

## 2021-10-28 DIAGNOSIS — I739 Peripheral vascular disease, unspecified: Secondary | ICD-10-CM | POA: Diagnosis not present

## 2021-10-28 NOTE — Progress Notes (Signed)
Patient name: Jessica [name] MRN: [id number] DOB: [birth year] Sex: female  REASON FOR VISIT: 1 month follow-up left foot pain  HPI: Jessica [name] is a 87 y.o. female well-known to vascular surgery that presents for 1 month follow-up to reevaluate left foot pain.  She presented about a month ago with new left foot pain and was very concerned that this could be a vascular etiology. She previously underwent celiac stent angioplasty on 05/15/2021 for an in-stent stenosis.  We used a Celt closure device in the left common femoral artery and ultimately she had a complication of the closure device and developed a left common femoral occlusion.  She subsequently underwent a left common femoral endarterectomy with bovine patch with findings of a dissection on 05/20/2021.  She has also had a closure issue in the right groin requiring endarterectomy as well.  Today she states her left foot symptoms have completely resolved.  She is having no more pain.  She has some chronic numbness on the lateral part of the foot but this is not new.  Overall she is pleased with her progress.    Past Medical History:  Diagnosis Date   Anemia    Arthritis    "thumbs" (06/27/2015)   Bilateral renal artery stenosis (HCC)    Cataract    Chronic bronchitis (HCC)    Chronic lower back pain    COPD (chronic obstructive pulmonary disease) (HCC)    GERD (gastroesophageal reflux disease)    Headache    "probably weekly" (06/27/2015)   Hypertension    Hypertriglyceridemia    Migraine    "maybe monthly" (06/27/2015)   Pneumonia     Past Surgical History:  Procedure Laterality Date   BALLOON DILATION  1980's?   "for renal stenosis"   CATARACT EXTRACTION, BILATERAL     ENDARTERECTOMY FEMORAL Right 05/15/2020   Procedure: Right Common Femoral Endarterectomy;  Surgeon: Marty Heck, MD;  Location: [facility] OR;  Service: Vascular;  Laterality: Right;   FEMORAL ARTERY EXPLORATION Left 05/20/2021   Procedure: LEFT LEG  THROMBECTOMY WITH LEFT FEMORAL  ENDARTERECTOMY AND PATCH ANGIOPLASTY;  Surgeon: Rosetta Posner, MD;  Location: [facility] OR;  Service: Vascular;  Laterality: Left;   PATCH ANGIOPLASTY Right 05/15/2020   Procedure: Common Femoral Artery Patch Angioplasty using XenoSure Bovine Pericardial Patch;  Surgeon: Marty Heck, MD;  Location: [facility];  Service: Vascular;  Laterality: Right;   PERIPHERAL VASCULAR BALLOON ANGIOPLASTY  05/15/2021   Procedure: PERIPHERAL VASCULAR BALLOON ANGIOPLASTY;  Surgeon: Marty Heck, MD;  Location: [place] [facility];  Service: Cardiovascular;;   Celiac   PERIPHERAL VASCULAR INTERVENTION  05/15/2020   Procedure: PERIPHERAL VASCULAR INTERVENTION;  Surgeon: Marty Heck, MD;  Location: [place] [facility];  Service: Cardiovascular;;  Celiac   RECONSTRUCTION OF EYELID Bilateral 01/2016   Upper and lower eyelid   RENAL ANGIOPLASTY     TOE SURGERY Bilateral    "straightened big toe"   TONSILLECTOMY  ~ [place] N/A 05/15/2020   Procedure: MESENTERIC  ANGIOGRAPHY;  Surgeon: Marty Heck, MD;  Location: [place] [facility];  Service: Cardiovascular;  Laterality: N/A;   VISCERAL ANGIOGRAPHY N/A 05/15/2021   Procedure: MESENTRIC ANGIOGRAPHY;  Surgeon: Marty Heck, MD;  Location: [place] [facility];  Service: Cardiovascular;  Laterality: N/A;    Family History  Problem Relation Age of Onset   Parkinsonism Father    Cancer Brother  Bladder   Hypertension Brother    Hyperlipidemia Brother    Stroke Maternal Grandfather    Colon cancer Neg Hx    Colon polyps Neg Hx    Stomach cancer Neg Hx     SOCIAL HISTORY: Social History   Tobacco Use   Smoking status: Former    Packs/day: 0.50    Years: 20.00    Pack years: 10.00    Types: Cigarettes   Smokeless tobacco: Never   Tobacco comments:    'quit smoking in the 1990's?"  Substance Use Topics   Alcohol use: Yes    Alcohol/week: 0.0 - 1.0  standard drinks    Comment: rarely    No Known Allergies  Current Outpatient Medications  Medication Sig Dispense Refill   albuterol (PROVENTIL) (2.5 MG/3ML) 0.083% nebulizer solution Take 3 mLs (2.5 mg total) by nebulization every 6 (six) hours as needed for wheezing or shortness of breath. 75 mL 12   albuterol (VENTOLIN HFA) 108 (90 Base) MCG/ACT inhaler Inhale 2 puffs into the lungs every 6 (six) hours as needed for wheezing or shortness of breath.     amLODipine (NORVASC) 5 MG tablet Take 5 mg by mouth daily.     aspirin EC 81 MG tablet Take 1 tablet (81 mg total) by mouth daily. Swallow whole. 150 tablet 2   atorvastatin (LIPITOR) 20 MG tablet TAKE 1 TABLET BY MOUTH EVERY DAY (Patient taking differently: Take 20 mg by mouth daily.) 90 tablet 3   clopidogrel (PLAVIX) 75 MG tablet Take 1 tablet (75 mg total) by mouth daily. 30 tablet 0   estradiol (ESTRACE VAGINAL) 0.1 MG/GM vaginal cream Place 1 g vaginally 2 (two) times a week. 42.5 g 12   Fluticasone-Umeclidin-Vilant (TRELEGY ELLIPTA) 100-62.5-25 MCG/ACT AEPB Inhale 1 puff into the lungs daily. 2 each 0   gabapentin (NEURONTIN) 100 MG capsule Take 100 mg by mouth at bedtime.     HYDROcodone-acetaminophen (NORCO) 10-325 MG tablet Take 1 tablet by mouth 3 (three) times daily as needed (pain).     ipratropium (ATROVENT) 0.03 % nasal spray Place 2 sprays into both nostrils every 12 (twelve) hours. (Patient taking differently: Place 2 sprays into both nostrils 2 (two) times daily as needed for rhinitis.) 30 mL 12   losartan (COZAAR) 100 MG tablet Take 100 mg by mouth every evening.     mirtazapine (REMERON) 15 MG tablet Take 15 mg by mouth at bedtime.     montelukast (SINGULAIR) 10 MG tablet Take 10 mg by mouth at bedtime.   6   Multiple Vitamins-Minerals (MULTIVITAMIN PO) Take 1 tablet by mouth daily.      Multiple Vitamins-Minerals (PRESERVISION AREDS 2) CAPS Take 1 capsule by mouth 2 (two) times daily.     omeprazole (PRILOSEC) 40 MG  capsule TAKE 1 CAPSULE (40 MG TOTAL) BY MOUTH IN THE MORNING AND AT BEDTIME. 180 capsule 1   propranolol ER (INDERAL LA) 120 MG 24 hr capsule Take 120 mg by mouth daily.  3   TRELEGY ELLIPTA 100-62.5-25 MCG/ACT AEPB INHALE 1 PUFF BY MOUTH EVERY DAY 60 each 6   No current facility-administered medications for this visit.    REVIEW OF SYSTEMS:  [X]  denotes positive finding, [ ]  denotes negative finding Cardiac  Comments:  Chest pain or chest pressure:    Shortness of breath upon exertion:    Short of breath when lying flat:    Irregular heart rhythm:        Vascular  Pain in calf, thigh, or hip brought on by ambulation:    Pain in feet at night that wakes you up from your sleep:     Blood clot in your veins:    Leg swelling:         Pulmonary    Oxygen at home:    Productive cough:     Wheezing:         Neurologic    Sudden weakness in arms or legs:     Sudden numbness in arms or legs:     Sudden onset of difficulty speaking or slurred speech:    Temporary loss of vision in one eye:     Problems with dizziness:         Gastrointestinal    Blood in stool:     Vomited blood:         Genitourinary    Burning when urinating:     Blood in urine:        Psychiatric    Major depression:         Hematologic    Bleeding problems:    Problems with blood clotting too easily:        Skin    Rashes or ulcers:        Constitutional    Fever or chills:      PHYSICAL EXAM: Vitals:   10/28/21 1110  BP: (!) 115/52  Pulse: 76  Resp: 16  Temp: (!) 97.1 F (36.2 C)  TempSrc: Temporal  SpO2: 97%  Weight: 87 lb 4.8 oz (39.6 kg)  Height: 5\' 2"  (1.575 m)    GENERAL: The patient is a well-nourished female, in no acute distress. The vital signs are documented above. CARDIAC: There is a regular rate and rhythm.  VASCULAR:  Left femoral pulse palpable Left DP and PT signals Foot is warm  DATA:   Previous left lower extremity arterial duplex shows the common femoral is  patent at site of previous endarterectomy.  There is a moderate SFA stenosis and a high-grade popliteal stenosis.  Previous ABI 0.66 on the right and 0.64 on the left  Assessment/Plan:  87 year old female presents for 1 month follow-up of left foot pain.  Fortunately her pain has completely resolved over the last month.  She still has a good femoral pulse and has Doppler signals in the left dorsalis pedis and posterior tibial arteries.  ABI 0.6 and she does have infrainguinal disease with moderate SFA and high-grade popliteal stenosis but again with resolution of symptoms there is no indication for intervention at this time.  She has no claudication or rest pain or active wounds at this time.  We will follow her in 6 months with ABIs and mesenteric duplex for her mesenteric stent.  Marty Heck, MD [facility] Office: [phone]

## 2021-10-31 ENCOUNTER — Other Ambulatory Visit: Payer: Self-pay

## 2021-10-31 DIAGNOSIS — K551 Chronic vascular disorders of intestine: Secondary | ICD-10-CM

## 2021-10-31 DIAGNOSIS — I739 Peripheral vascular disease, unspecified: Secondary | ICD-10-CM

## 2021-11-10 DIAGNOSIS — H35373 Puckering of macula, bilateral: Secondary | ICD-10-CM | POA: Diagnosis not present

## 2021-11-10 DIAGNOSIS — H353221 Exudative age-related macular degeneration, left eye, with active choroidal neovascularization: Secondary | ICD-10-CM | POA: Diagnosis not present

## 2021-11-10 DIAGNOSIS — H43812 Vitreous degeneration, left eye: Secondary | ICD-10-CM | POA: Diagnosis not present

## 2021-11-10 DIAGNOSIS — H353112 Nonexudative age-related macular degeneration, right eye, intermediate dry stage: Secondary | ICD-10-CM | POA: Diagnosis not present

## 2021-12-11 ENCOUNTER — Other Ambulatory Visit: Payer: Self-pay | Admitting: Pulmonary Disease

## 2022-01-05 DIAGNOSIS — H353112 Nonexudative age-related macular degeneration, right eye, intermediate dry stage: Secondary | ICD-10-CM | POA: Diagnosis not present

## 2022-01-05 DIAGNOSIS — H353221 Exudative age-related macular degeneration, left eye, with active choroidal neovascularization: Secondary | ICD-10-CM | POA: Diagnosis not present

## 2022-01-06 DIAGNOSIS — H353221 Exudative age-related macular degeneration, left eye, with active choroidal neovascularization: Secondary | ICD-10-CM | POA: Diagnosis not present

## 2022-01-06 DIAGNOSIS — H353112 Nonexudative age-related macular degeneration, right eye, intermediate dry stage: Secondary | ICD-10-CM | POA: Diagnosis not present

## 2022-01-09 DIAGNOSIS — H353131 Nonexudative age-related macular degeneration, bilateral, early dry stage: Secondary | ICD-10-CM | POA: Diagnosis not present

## 2022-01-09 DIAGNOSIS — Z961 Presence of intraocular lens: Secondary | ICD-10-CM | POA: Diagnosis not present

## 2022-01-09 DIAGNOSIS — H18413 Arcus senilis, bilateral: Secondary | ICD-10-CM | POA: Diagnosis not present

## 2022-01-09 DIAGNOSIS — H04123 Dry eye syndrome of bilateral lacrimal glands: Secondary | ICD-10-CM | POA: Diagnosis not present

## 2022-02-11 DIAGNOSIS — M810 Age-related osteoporosis without current pathological fracture: Secondary | ICD-10-CM | POA: Diagnosis not present

## 2022-02-11 DIAGNOSIS — I774 Celiac artery compression syndrome: Secondary | ICD-10-CM | POA: Diagnosis not present

## 2022-02-11 DIAGNOSIS — R7301 Impaired fasting glucose: Secondary | ICD-10-CM | POA: Diagnosis not present

## 2022-02-11 DIAGNOSIS — R636 Underweight: Secondary | ICD-10-CM | POA: Diagnosis not present

## 2022-02-11 DIAGNOSIS — G894 Chronic pain syndrome: Secondary | ICD-10-CM | POA: Diagnosis not present

## 2022-02-11 DIAGNOSIS — G459 Transient cerebral ischemic attack, unspecified: Secondary | ICD-10-CM | POA: Diagnosis not present

## 2022-02-11 DIAGNOSIS — M5416 Radiculopathy, lumbar region: Secondary | ICD-10-CM | POA: Diagnosis not present

## 2022-02-11 DIAGNOSIS — E785 Hyperlipidemia, unspecified: Secondary | ICD-10-CM | POA: Diagnosis not present

## 2022-02-11 DIAGNOSIS — I10 Essential (primary) hypertension: Secondary | ICD-10-CM | POA: Diagnosis not present

## 2022-02-11 DIAGNOSIS — I701 Atherosclerosis of renal artery: Secondary | ICD-10-CM | POA: Diagnosis not present

## 2022-02-11 DIAGNOSIS — I700 Atherosclerosis of aorta: Secondary | ICD-10-CM | POA: Diagnosis not present

## 2022-02-11 DIAGNOSIS — J432 Centrilobular emphysema: Secondary | ICD-10-CM | POA: Diagnosis not present

## 2022-02-16 DIAGNOSIS — D1801 Hemangioma of skin and subcutaneous tissue: Secondary | ICD-10-CM | POA: Diagnosis not present

## 2022-02-16 DIAGNOSIS — I788 Other diseases of capillaries: Secondary | ICD-10-CM | POA: Diagnosis not present

## 2022-02-16 DIAGNOSIS — L814 Other melanin hyperpigmentation: Secondary | ICD-10-CM | POA: Diagnosis not present

## 2022-02-16 DIAGNOSIS — D225 Melanocytic nevi of trunk: Secondary | ICD-10-CM | POA: Diagnosis not present

## 2022-02-16 DIAGNOSIS — L570 Actinic keratosis: Secondary | ICD-10-CM | POA: Diagnosis not present

## 2022-02-16 DIAGNOSIS — L821 Other seborrheic keratosis: Secondary | ICD-10-CM | POA: Diagnosis not present

## 2022-02-16 DIAGNOSIS — Z85828 Personal history of other malignant neoplasm of skin: Secondary | ICD-10-CM | POA: Diagnosis not present

## 2022-02-16 DIAGNOSIS — L820 Inflamed seborrheic keratosis: Secondary | ICD-10-CM | POA: Diagnosis not present

## 2022-02-18 DIAGNOSIS — Z79891 Long term (current) use of opiate analgesic: Secondary | ICD-10-CM | POA: Diagnosis not present

## 2022-02-18 DIAGNOSIS — M5416 Radiculopathy, lumbar region: Secondary | ICD-10-CM | POA: Diagnosis not present

## 2022-02-18 DIAGNOSIS — Z79899 Other long term (current) drug therapy: Secondary | ICD-10-CM | POA: Diagnosis not present

## 2022-02-18 DIAGNOSIS — G894 Chronic pain syndrome: Secondary | ICD-10-CM | POA: Diagnosis not present

## 2022-02-18 DIAGNOSIS — Z5181 Encounter for therapeutic drug level monitoring: Secondary | ICD-10-CM | POA: Diagnosis not present

## 2022-02-27 DIAGNOSIS — I700 Atherosclerosis of aorta: Secondary | ICD-10-CM | POA: Diagnosis not present

## 2022-02-27 DIAGNOSIS — I10 Essential (primary) hypertension: Secondary | ICD-10-CM | POA: Diagnosis not present

## 2022-02-27 DIAGNOSIS — N1831 Chronic kidney disease, stage 3a: Secondary | ICD-10-CM | POA: Diagnosis not present

## 2022-02-27 DIAGNOSIS — M810 Age-related osteoporosis without current pathological fracture: Secondary | ICD-10-CM | POA: Diagnosis not present

## 2022-03-02 DIAGNOSIS — H353112 Nonexudative age-related macular degeneration, right eye, intermediate dry stage: Secondary | ICD-10-CM | POA: Diagnosis not present

## 2022-03-02 DIAGNOSIS — H353221 Exudative age-related macular degeneration, left eye, with active choroidal neovascularization: Secondary | ICD-10-CM | POA: Diagnosis not present

## 2022-04-25 ENCOUNTER — Other Ambulatory Visit: Payer: Self-pay | Admitting: Vascular Surgery

## 2022-04-28 DIAGNOSIS — H353112 Nonexudative age-related macular degeneration, right eye, intermediate dry stage: Secondary | ICD-10-CM | POA: Diagnosis not present

## 2022-04-28 DIAGNOSIS — H353221 Exudative age-related macular degeneration, left eye, with active choroidal neovascularization: Secondary | ICD-10-CM | POA: Diagnosis not present

## 2022-04-28 DIAGNOSIS — H43813 Vitreous degeneration, bilateral: Secondary | ICD-10-CM | POA: Diagnosis not present

## 2022-04-28 DIAGNOSIS — H35373 Puckering of macula, bilateral: Secondary | ICD-10-CM | POA: Diagnosis not present

## 2022-05-05 ENCOUNTER — Ambulatory Visit (INDEPENDENT_AMBULATORY_CARE_PROVIDER_SITE_OTHER): Payer: Medicare Other | Admitting: Vascular Surgery

## 2022-05-05 ENCOUNTER — Ambulatory Visit (INDEPENDENT_AMBULATORY_CARE_PROVIDER_SITE_OTHER)
Admission: RE | Admit: 2022-05-05 | Discharge: 2022-05-05 | Disposition: A | Discharge status: Home / Self Care | Payer: Medicare Other | Source: Ambulatory Visit | Attending: Vascular Surgery | Admitting: Vascular Surgery

## 2022-05-05 ENCOUNTER — Ambulatory Visit (HOSPITAL_COMMUNITY)
Admission: RE | Admit: 2022-05-05 | Discharge: 2022-05-05 | Disposition: A | Discharge status: Home / Self Care | Payer: Medicare Other | Source: Ambulatory Visit | Attending: Vascular Surgery | Admitting: Vascular Surgery

## 2022-05-05 VITALS — BP 166/71 | HR 63 | Temp 96.9°F | Resp 14 | Ht 61.0 in

## 2022-05-05 DIAGNOSIS — I739 Peripheral vascular disease, unspecified: Secondary | ICD-10-CM | POA: Insufficient documentation

## 2022-05-05 DIAGNOSIS — K551 Chronic vascular disorders of intestine: Secondary | ICD-10-CM | POA: Insufficient documentation

## 2022-05-05 NOTE — Progress Notes (Signed)
Patient name: Jessica [name] MRN: [id number] DOB: January 21, 1932 Sex: female  REASON FOR VISIT: 6 month follow-up with ABI and mesenteric duplex  HPI: Jessica [name] is a 88 y.o. female well-known to vascular surgery that presents for 6 month follow-up for surveillance of her peripheral vascular and mesenteric disease.  She initially underwent angioplasty and stent of her celiac artery on 05/15/2020 for mesenteric ischemia with chronic  gastritis.  There was also question of an SMA stenosis that was widely patent on angiogram and no intervention was performed on the SMA.  She had complication of closure device in the right groin requiring common femoral endarterectomy.  She then underwent celiac stent angioplasty on 05/15/2021 for an in-stent stenosis.  We used a Celt closure device in the left common femoral artery and ultimately she had a complication of the closure device and developed a left common femoral occlusion.  She subsequently underwent a left common femoral endarterectomy with bovine patch with findings of a dissection on 05/20/2021.    Today she states that her legs are doing okay.  She has no pain with walking.  She has no pain at night.  She does have numbness in toes of both feet associated with chronic neuropathy.  As it relates to eating she has no food fear.  Does not have much of an appetite but weight has been stable.  Overall feels she is doing well.    Past Medical History:  Diagnosis Date   Anemia    Arthritis    "thumbs" (06/27/2015)   Bilateral renal artery stenosis (HCC)    Cataract    Chronic bronchitis (HCC)    Chronic lower back pain    COPD (chronic obstructive pulmonary disease) (HCC)    GERD (gastroesophageal reflux disease)    Headache    "probably weekly" (06/27/2015)   Hypertension    Hypertriglyceridemia    Migraine    "maybe monthly" (06/27/2015)   Pneumonia     Past Surgical History:  Procedure Laterality Date   BALLOON DILATION  1980's?   "for renal  stenosis"   CATARACT EXTRACTION, BILATERAL     ENDARTERECTOMY FEMORAL Right 05/15/2020   Procedure: Right Common Femoral Endarterectomy;  Surgeon: Marty Heck, MD;  Location[facility] OR;  Service: Vascular;  Laterality: Right;   FEMORAL ARTERY EXPLORATION Left 05/20/2021   Procedure: LEFT LEG THROMBECTOMY WITH LEFT FEMORAL  ENDARTERECTOMY AND PATCH ANGIOPLASTY;  Surgeon: Rosetta Posner, MD;  Location: [facility] OR;  Service: Vascular;  Laterality: Left;   PATCH ANGIOPLASTY Right 05/15/2020   Procedure: Common Femoral Artery Patch Angioplasty using XenoSure Bovine Pericardial Patch;  Surgeon: Marty Heck, MD;  Location: [place];  Service: Vascular;  Laterality: Right;   PERIPHERAL VASCULAR BALLOON ANGIOPLASTY  05/15/2021   Procedure: PERIPHERAL VASCULAR BALLOON ANGIOPLASTY;  Surgeon: Marty Heck, MD;  Location: [place] [facility];  Service: Cardiovascular;;   Celiac   PERIPHERAL VASCULAR INTERVENTION  05/15/2020   Procedure: PERIPHERAL VASCULAR INTERVENTION;  Surgeon: Marty Heck, MD;  Location: [place] [facility];  Service: Cardiovascular;;  Celiac   RECONSTRUCTION OF EYELID Bilateral 01/2016   Upper and lower eyelid   RENAL ANGIOPLASTY     TOE SURGERY Bilateral    "straightened big toe"   TONSILLECTOMY  ~ [place] N/A 05/15/2020   Procedure: MESENTERIC  ANGIOGRAPHY;  Surgeon: Marty Heck, MD;  Location: [place] [facility];  Service: Cardiovascular;  Laterality:  N/A;   VISCERAL ANGIOGRAPHY N/A 05/15/2021   Procedure: MESENTRIC ANGIOGRAPHY;  Surgeon: Marty Heck, MD;  Location: [place] [facility];  Service: Cardiovascular;  Laterality: N/A;    Family History  Problem Relation Age of Onset   Parkinsonism Father    Cancer Brother        Bladder   Hypertension Brother    Hyperlipidemia Brother    Stroke Maternal Grandfather    Colon cancer Neg Hx    Colon polyps Neg Hx    Stomach cancer Neg Hx     SOCIAL  HISTORY: Social History   Tobacco Use   Smoking status: Former    Packs/day: 0.50    Years: 20.00    Pack years: 10.00    Types: Cigarettes   Smokeless tobacco: Never   Tobacco comments:    'quit smoking in the 1990's?"  Substance Use Topics   Alcohol use: Yes    Alcohol/week: 0.0 - 1.0 standard drinks    Comment: rarely    No Known Allergies  Current Outpatient Medications  Medication Sig Dispense Refill   albuterol (PROVENTIL) (2.5 MG/3ML) 0.083% nebulizer solution Take 3 mLs (2.5 mg total) by nebulization every 6 (six) hours as needed for wheezing or shortness of breath. 75 mL 12   albuterol (VENTOLIN HFA) 108 (90 Base) MCG/ACT inhaler Inhale 2 puffs into the lungs every 6 (six) hours as needed for wheezing or shortness of breath.     amLODipine (NORVASC) 5 MG tablet Take 5 mg by mouth daily.     aspirin EC 81 MG tablet Take 1 tablet (81 mg total) by mouth daily. Swallow whole. 150 tablet 2   atorvastatin (LIPITOR) 20 MG tablet TAKE 1 TABLET BY MOUTH EVERY DAY 90 tablet 3   clopidogrel (PLAVIX) 75 MG tablet Take 1 tablet (75 mg total) by mouth daily. 30 tablet 0   estradiol (ESTRACE VAGINAL) 0.1 MG/GM vaginal cream Place 1 g vaginally 2 (two) times a week. 42.5 g 12   Fluticasone-Umeclidin-Vilant (TRELEGY ELLIPTA) 100-62.5-25 MCG/ACT AEPB Inhale 1 puff into the lungs daily. 2 each 0   gabapentin (NEURONTIN) 100 MG capsule Take 100 mg by mouth at bedtime.     HYDROcodone-acetaminophen (NORCO) 10-325 MG tablet Take 1 tablet by mouth 3 (three) times daily as needed (pain).     ipratropium (ATROVENT) 0.03 % nasal spray Place 2 sprays into both nostrils 2 (two) times daily as needed for rhinitis. 30 mL 3   losartan (COZAAR) 100 MG tablet Take 100 mg by mouth every evening.     mirtazapine (REMERON) 15 MG tablet Take 15 mg by mouth at bedtime.     montelukast (SINGULAIR) 10 MG tablet Take 10 mg by mouth at bedtime.   6   Multiple Vitamins-Minerals (MULTIVITAMIN PO) Take 1 tablet by  mouth daily.      Multiple Vitamins-Minerals (PRESERVISION AREDS 2) CAPS Take 1 capsule by mouth 2 (two) times daily.     omeprazole (PRILOSEC) 40 MG capsule TAKE 1 CAPSULE (40 MG TOTAL) BY MOUTH IN THE MORNING AND AT BEDTIME. 180 capsule 1   propranolol ER (INDERAL LA) 120 MG 24 hr capsule Take 120 mg by mouth daily.  3   TRELEGY ELLIPTA 100-62.5-25 MCG/ACT AEPB INHALE 1 PUFF BY MOUTH EVERY DAY 60 each 6   No current facility-administered medications for this visit.    REVIEW OF SYSTEMS:  '[X]'$  denotes positive finding, '[ ]'$  denotes negative finding Cardiac  Comments:  Chest pain or chest  pressure:    Shortness of breath upon exertion:    Short of breath when lying flat:    Irregular heart rhythm:        Vascular    Pain in calf, thigh, or hip brought on by ambulation:    Pain in feet at night that wakes you up from your sleep:     Blood clot in your veins:    Leg swelling:         Pulmonary    Oxygen at home:    Productive cough:     Wheezing:         Neurologic    Sudden weakness in arms or legs:     Sudden numbness in arms or legs:     Sudden onset of difficulty speaking or slurred speech:    Temporary loss of vision in one eye:     Problems with dizziness:         Gastrointestinal    Blood in stool:     Vomited blood:         Genitourinary    Burning when urinating:     Blood in urine:        Psychiatric    Major depression:         Hematologic    Bleeding problems:    Problems with blood clotting too easily:        Skin    Rashes or ulcers:        Constitutional    Fever or chills:      PHYSICAL EXAM: Vitals:   05/05/22 0951  BP: (!) 166/71  Pulse: 63  Resp: 14  Temp: (!) 96.9 F (36.1 C)  TempSrc: Temporal  SpO2: 96%  Height: '5\' 1"'$  (1.549 m)    GENERAL: The patient is a well-nourished female, in no acute distress. The vital signs are documented above. CARDIAC: There is a regular rate and rhythm.  VASCULAR:  Bilateral femoral pulses  palpable No palpable pedal pulses, no active tissue loss  DATA:   ABIs today are 0.69 on the right monophasic and 0.67 on the left monophasic and stable  Mesenteric duplex suggest high-grade stenosis in the celiac stent as well as the SMA  Assessment/Plan:  88 year old female presents for surveillance of her mesenteric artery disease as well as peripheral vascular disease.  Discussed that her mesenteric duplex today shows evidence of a recurrent high grade celiac stent stenosis as well as evidence of a high-grade SMA stenosis.  We have previously demonstrated on angiogram that she has no significant SMA stenosis even with previous elevated velocities on duplex.  She is having no symptoms of chronic mesenteric ischemia and I see no indication for reintervention at this time as she has had groin complications with both previous transfemoral access procedures.  In addition her lower extremity symptoms are stable with stable ABIs and discussed we can monitor this for now.  I will see her in 3 months with no studies just to monitor her clinical progress.  Marty Heck, MD [facility] Office: [phone]

## 2022-05-07 ENCOUNTER — Telehealth: Payer: Self-pay | Admitting: Pulmonary Disease

## 2022-05-07 NOTE — Telephone Encounter (Signed)
Called and left voicemail for [employer] to determine what files needs to be faxed over for patient.

## 2022-05-11 ENCOUNTER — Other Ambulatory Visit: Payer: Self-pay | Admitting: *Deleted

## 2022-05-13 NOTE — Telephone Encounter (Signed)
Mel checking on CMN sent by fax. Mel phone number is [phone].

## 2022-05-13 NOTE — Telephone Encounter (Signed)
Called and spoke with Manuela Schwartz at [facility]. I advised her that we have not received the CMN. She confirmed the fax number. She will go ahead and re-fax the form. I will be on the lookout for the form.

## 2022-05-21 NOTE — Telephone Encounter (Signed)
CMN received and placed in Chan's box.

## 2022-05-21 NOTE — Telephone Encounter (Signed)
Mel called back to check on the status of the CMN for a nebulizer. I have given her an alternative fax of [phone] to attempt to fax the CMN.

## 2022-05-21 NOTE — Telephone Encounter (Signed)
CMNs should ONLY be faxed to Chan's fax - [phone].    I don't see where we've gotten the CMN in our office.  Chan hasn't placed it on our CMN list.

## 2022-05-23 ENCOUNTER — Other Ambulatory Visit: Payer: Self-pay | Admitting: Pulmonary Disease

## 2022-06-12 DIAGNOSIS — E785 Hyperlipidemia, unspecified: Secondary | ICD-10-CM | POA: Diagnosis not present

## 2022-06-12 DIAGNOSIS — R7989 Other specified abnormal findings of blood chemistry: Secondary | ICD-10-CM | POA: Diagnosis not present

## 2022-06-12 DIAGNOSIS — D649 Anemia, unspecified: Secondary | ICD-10-CM | POA: Diagnosis not present

## 2022-06-12 DIAGNOSIS — I10 Essential (primary) hypertension: Secondary | ICD-10-CM | POA: Diagnosis not present

## 2022-06-12 DIAGNOSIS — R7301 Impaired fasting glucose: Secondary | ICD-10-CM | POA: Diagnosis not present

## 2022-06-12 DIAGNOSIS — M810 Age-related osteoporosis without current pathological fracture: Secondary | ICD-10-CM | POA: Diagnosis not present

## 2022-06-16 ENCOUNTER — Telehealth: Payer: Self-pay | Admitting: *Deleted

## 2022-06-16 NOTE — Chronic Care Management (AMB) (Signed)
  Care Management   Outreach Note  06/16/2022 Name: Jessica [name] MRN: [id number] DOB: 08/07/1932  Referred by: Reynold Bowen, MD Reason for referral : Care Coordination (Initial outreach to schedule call with RNCM from segmented list [facility])   A telephone outreach was attempted today.Patient ask to follow back up in 7days. The patient was referred to the case management team for assistance with care management and care coordination.   Follow Up Plan:   The care management team will reach out to the patient again over the next 7 days. If patient returns call to provider office, please advise to call [facility]* at [phone].*  [place]  Direct Dial: [phone]

## 2022-06-19 DIAGNOSIS — R7301 Impaired fasting glucose: Secondary | ICD-10-CM | POA: Diagnosis not present

## 2022-06-19 DIAGNOSIS — Z95820 Peripheral vascular angioplasty status with implants and grafts: Secondary | ICD-10-CM | POA: Diagnosis not present

## 2022-06-19 DIAGNOSIS — I10 Essential (primary) hypertension: Secondary | ICD-10-CM | POA: Diagnosis not present

## 2022-06-19 DIAGNOSIS — J432 Centrilobular emphysema: Secondary | ICD-10-CM | POA: Diagnosis not present

## 2022-06-19 DIAGNOSIS — M810 Age-related osteoporosis without current pathological fracture: Secondary | ICD-10-CM | POA: Diagnosis not present

## 2022-06-19 DIAGNOSIS — I700 Atherosclerosis of aorta: Secondary | ICD-10-CM | POA: Diagnosis not present

## 2022-06-19 DIAGNOSIS — R82998 Other abnormal findings in urine: Secondary | ICD-10-CM | POA: Diagnosis not present

## 2022-06-19 DIAGNOSIS — K589 Irritable bowel syndrome without diarrhea: Secondary | ICD-10-CM | POA: Diagnosis not present

## 2022-06-19 DIAGNOSIS — Z0000 Encounter for general adult medical examination without abnormal findings: Secondary | ICD-10-CM | POA: Diagnosis not present

## 2022-06-19 DIAGNOSIS — Z1331 Encounter for screening for depression: Secondary | ICD-10-CM | POA: Diagnosis not present

## 2022-06-19 DIAGNOSIS — E785 Hyperlipidemia, unspecified: Secondary | ICD-10-CM | POA: Diagnosis not present

## 2022-06-19 DIAGNOSIS — I774 Celiac artery compression syndrome: Secondary | ICD-10-CM | POA: Diagnosis not present

## 2022-06-19 DIAGNOSIS — R636 Underweight: Secondary | ICD-10-CM | POA: Diagnosis not present

## 2022-06-19 DIAGNOSIS — Z1389 Encounter for screening for other disorder: Secondary | ICD-10-CM | POA: Diagnosis not present

## 2022-06-19 DIAGNOSIS — M5416 Radiculopathy, lumbar region: Secondary | ICD-10-CM | POA: Diagnosis not present

## 2022-06-23 DIAGNOSIS — H353221 Exudative age-related macular degeneration, left eye, with active choroidal neovascularization: Secondary | ICD-10-CM | POA: Diagnosis not present

## 2022-06-23 DIAGNOSIS — H353112 Nonexudative age-related macular degeneration, right eye, intermediate dry stage: Secondary | ICD-10-CM | POA: Diagnosis not present

## 2022-06-25 DIAGNOSIS — G894 Chronic pain syndrome: Secondary | ICD-10-CM | POA: Diagnosis not present

## 2022-06-25 DIAGNOSIS — M5459 Other low back pain: Secondary | ICD-10-CM | POA: Diagnosis not present

## 2022-06-25 DIAGNOSIS — M5416 Radiculopathy, lumbar region: Secondary | ICD-10-CM | POA: Diagnosis not present

## 2022-06-25 DIAGNOSIS — Z5181 Encounter for therapeutic drug level monitoring: Secondary | ICD-10-CM | POA: Diagnosis not present

## 2022-06-30 DIAGNOSIS — Z1231 Encounter for screening mammogram for malignant neoplasm of breast: Secondary | ICD-10-CM | POA: Diagnosis not present

## 2022-07-02 ENCOUNTER — Encounter: Payer: Self-pay | Admitting: Nurse Practitioner

## 2022-07-02 ENCOUNTER — Ambulatory Visit (INDEPENDENT_AMBULATORY_CARE_PROVIDER_SITE_OTHER): Payer: Medicare Other | Admitting: Nurse Practitioner

## 2022-07-02 VITALS — BP 128/78 | HR 68 | Ht 60.0 in

## 2022-07-02 DIAGNOSIS — Z01419 Encounter for gynecological examination (general) (routine) without abnormal findings: Secondary | ICD-10-CM

## 2022-07-02 DIAGNOSIS — M8589 Other specified disorders of bone density and structure, multiple sites: Secondary | ICD-10-CM | POA: Diagnosis not present

## 2022-07-02 DIAGNOSIS — N952 Postmenopausal atrophic vaginitis: Secondary | ICD-10-CM

## 2022-07-02 MED ORDER — ESTRADIOL 0.1 MG/GM VA CREA: 1.0000 g | TOPICAL_CREAM | VAGINAL | 3 refills | Status: DC

## 2022-07-02 NOTE — Progress Notes (Signed)
   Jessica [name] [birth year] [id number]   History:  88 y.o. G1P1001 presents for breast and pelvic exam. Postmenopausal. Using vaginal estrogen for dryness and pain with intercourse. Has not been sexually active lately due to partner's health but using it externally. S/P 1973 TAH for menorrhagia.  Normal pap and mammogram history. HTN, HLD, osteopenia managed by PCP, sees cardiology as needed. Currently being treated for UTI.   Gynecologic History Patient's last menstrual period was 11/30/1976.   Contraception: status post hysterectomy Sexually active: No  Health Maintenance Last Pap: No longer screening per guidelines Last mammogram: 06/29/2022 per patient. Results were: Normal Last colonoscopy: 11/12/2016 Last Dexa: 09/02/2015. Results were: T-score - 1.6, FRAX 9.7% / 2.5%  Past medical history, past surgical history, family history and social history were all reviewed and documented in the EPIC chart. Widowed. Daughter lives in [place], [place].   ROS:  A ROS was performed and pertinent positives and negatives are included.  Exam:  Vitals:   07/02/22 1112  BP: 128/78  Pulse: 68  SpO2: 96%  Height: 5' (1.524 m)    Body mass index is 17.05 kg/m.  General appearance:  Underweight Thyroid:  Symmetrical, normal in size, without palpable masses or nodularity. Respiratory  Auscultation:  Clear without wheezing or rhonchi Cardiovascular  Auscultation:  Regular rate, without rubs, murmurs or gallops  Edema/varicosities:  Not grossly evident Abdominal  Soft,nontender, without masses, guarding or rebound.  Liver/spleen:  No organomegaly noted  Hernia:  None appreciated  Skin  Inspection:  Grossly normal Breasts: Examined lying and sitting.   Right: Without masses, retractions, nipple discharge or axillary adenopathy.   Left: Without masses, retractions, nipple discharge or axillary adenopathy. Genitourinary   Inguinal/mons:  Normal without inguinal adenopathy  External genitalia:   Atrophic changes  BUS/Urethra/Skene's glands:  Normal  Vagina:  Atrophic changes  Cervix:  Absent  Uterus:  Absent  Adnexa/parametria:     Rt: Normal in size, without masses or tenderness.   Lt: Normal in size, without masses or tenderness.  Anus and perineum: Normal  Digital rectal exam: Normal sphincter tone without palpated masses or tenderness  Patient informed chaperone available to be present for breast and pelvic exam. Patient has requested no chaperone to be present. Patient has been advised what will be completed during breast and pelvic exam.   Assessment/Plan:  88 y.o. G1P1001 for breast and pelvic exam.   Well female exam with routine gynecological exam - Education provided on SBEs, importance of preventative screenings, current guidelines, high calcium diet, regular exercise, and multivitamin daily. Labs with PCP.   Osteopenia of multiple sites - T-score -1.6 in 2016. Continue vitamin D supplement and regular exercise. Recommend repeating DXA now.   Atrophic vaginitis - Plan: estradiol (ESTRACE VAGINAL) 0.1 MG/GM vaginal cream twice weekly. Using externally. She is aware of risk for systemic absorption increasing risk of blood clots, heart attack, stroke, and breast cancer. She would like to continue.   Screening for cervical cancer - Normal Pap history. No longer screening per guidelines.   Screening for breast cancer - Normal mammogram history.  Continue annual screenings.  Normal breast exam today.  Screening for colon cancer - 2017 colonoscopy. Screenings no longer recommended per GI.   Return in 1 year for medication follow up.     [place], 11:46 AM 07/02/2022

## 2022-07-13 NOTE — Chronic Care Management (AMB) (Unsigned)
  Care Coordination  Outreach Note  07/13/2022 Name: Jessica [name] MRN: [id number] DOB: [birth year]   Care Coordination Outreach Attempts  A second unsuccessful outreach was attempted today to offer the patient with information about available care coordination services as a benefit of their health plan.     Follow Up Plan:  Additional outreach attempts will be made to offer the patient care coordination information and services.   Encounter Outcome:  No Answer   [place]  Direct Dial: [phone]

## 2022-07-14 ENCOUNTER — Other Ambulatory Visit: Payer: Self-pay | Admitting: Nurse Practitioner

## 2022-07-14 ENCOUNTER — Ambulatory Visit (INDEPENDENT_AMBULATORY_CARE_PROVIDER_SITE_OTHER): Payer: Medicare Other

## 2022-07-14 DIAGNOSIS — Z780 Asymptomatic menopausal state: Secondary | ICD-10-CM

## 2022-07-14 DIAGNOSIS — Z13820 Encounter for screening for osteoporosis: Secondary | ICD-10-CM | POA: Diagnosis not present

## 2022-07-14 DIAGNOSIS — M8589 Other specified disorders of bone density and structure, multiple sites: Secondary | ICD-10-CM

## 2022-07-16 NOTE — Chronic Care Management (AMB) (Signed)
  Care Coordination   Note   07/16/2022 Name: Jessica [name] MRN: [id number] DOB: Oct 01, 1932  [name] is a 88 y.o. year old female who sees [place], Annie Main, MD for primary care. I reached out to [name] by phone today to offer care coordination services.  Ms. Kruschke was given information about Care Coordination services today including:   The Care Coordination services include support from the care team which includes your Nurse Coordinator, Clinical Social Worker, or Pharmacist.  The Care Coordination team is here to help remove barriers to the health concerns and goals most important to you. Care Coordination services are voluntary, and the patient may decline or stop services at any time by request to their care team member.   Care Coordination Consent Status: Patient did not agree to participate in care coordination services at this time.    Encounter Outcome:  Pt. Refused  [place]  Direct Dial: [phone]

## 2022-07-26 ENCOUNTER — Ambulatory Visit: Payer: Medicare Other

## 2022-07-26 ENCOUNTER — Other Ambulatory Visit: Payer: Self-pay | Admitting: Pulmonary Disease

## 2022-08-04 ENCOUNTER — Ambulatory Visit: Payer: Medicare Other | Admitting: Vascular Surgery

## 2022-08-11 ENCOUNTER — Encounter: Payer: Self-pay | Admitting: Vascular Surgery

## 2022-08-11 ENCOUNTER — Ambulatory Visit (INDEPENDENT_AMBULATORY_CARE_PROVIDER_SITE_OTHER): Payer: Medicare Other | Admitting: Vascular Surgery

## 2022-08-11 VITALS — BP 111/71 | HR 63 | Temp 97.0°F | Resp 14 | Ht 62.0 in | Wt 83.0 lb

## 2022-08-11 DIAGNOSIS — K551 Chronic vascular disorders of intestine: Secondary | ICD-10-CM

## 2022-08-11 NOTE — Progress Notes (Signed)
Patient name: Jessica [name] MRN: [id number] DOB: 02-Feb-1932 Sex: female  REASON FOR VISIT: 3 month follow-up  HPI: Jessica [name] is a 88 y.o. female well-known to vascular surgery that presents for 3 month interval follow-up for surveillance of her peripheral vascular and mesenteric disease.    She initially underwent angioplasty and stent of her celiac artery on 05/15/2020 for mesenteric ischemia with chronic  gastritis.  There was also question of an SMA stenosis that was widely patent on angiogram and no intervention was performed on the SMA.  She had complication of closure device in the right groin requiring common femoral endarterectomy.  She then underwent celiac stent angioplasty on 05/15/2021 for an in-stent stenosis.  We used a Celt closure device in the left common femoral artery and ultimately she had a complication of the closure device and developed a left common femoral occlusion.  She subsequently underwent a left common femoral endarterectomy with bovine patch with findings of a dissection on 05/20/2021.    Today she states that her legs are doing okay.  She has no pain with walking.  She has no pain at night.  She does have numbness in toes of both feet associated with chronic neuropathy.  As it relates to eating she has no food fear.  Does not have much of an appetite but weight has been stable.  No new concerns today.    Past Medical History:  Diagnosis Date   Anemia    Arthritis    "thumbs" (06/27/2015)   Bilateral renal artery stenosis (HCC)    Cataract    Chronic bronchitis (HCC)    Chronic lower back pain    COPD (chronic obstructive pulmonary disease) (HCC)    GERD (gastroesophageal reflux disease)    Headache    "probably weekly" (06/27/2015)   Hypertension    Hypertriglyceridemia    Migraine    "maybe monthly" (06/27/2015)   Pneumonia     Past Surgical History:  Procedure Laterality Date   BALLOON DILATION  1980's?   "for renal stenosis"   CATARACT EXTRACTION,  BILATERAL     ENDARTERECTOMY FEMORAL Right 05/15/2020   Procedure: Right Common Femoral Endarterectomy;  Surgeon: Marty Heck, MD;  Location: [facility] OR;  Service: Vascular;  Laterality: Right;   FEMORAL ARTERY EXPLORATION Left 05/20/2021   Procedure: LEFT LEG THROMBECTOMY WITH LEFT FEMORAL  ENDARTERECTOMY AND PATCH ANGIOPLASTY;  Surgeon: Rosetta Posner, MD;  Location: [facility] OR;  Service: Vascular;  Laterality: Left;   PATCH ANGIOPLASTY Right 05/15/2020   Procedure: Common Femoral Artery Patch Angioplasty using XenoSure Bovine Pericardial Patch;  Surgeon: Marty Heck, MD;  Location: [place];  Service: Vascular;  Laterality: Right;   PERIPHERAL VASCULAR BALLOON ANGIOPLASTY  05/15/2021   Procedure: PERIPHERAL VASCULAR BALLOON ANGIOPLASTY;  Surgeon: Marty Heck, MD;  Location: [place] [facility];  Service: Cardiovascular;;   Celiac   PERIPHERAL VASCULAR INTERVENTION  05/15/2020   Procedure: PERIPHERAL VASCULAR INTERVENTION;  Surgeon: Marty Heck, MD;  Location: [place] [facility];  Service: Cardiovascular;;  Celiac   RECONSTRUCTION OF EYELID Bilateral 01/2016   Upper and lower eyelid   RENAL ANGIOPLASTY     TOE SURGERY Bilateral    "straightened big toe"   TONSILLECTOMY  ~ [place] N/A 05/15/2020   Procedure: MESENTERIC  ANGIOGRAPHY;  Surgeon: Marty Heck, MD;  Location: [place] [facility];  Service: Cardiovascular;  Laterality: N/A;   VISCERAL  ANGIOGRAPHY N/A 05/15/2021   Procedure: MESENTRIC ANGIOGRAPHY;  Surgeon: Marty Heck, MD;  Location: [place] [facility];  Service: Cardiovascular;  Laterality: N/A;    Family History  Problem Relation Age of Onset   Parkinsonism Father    Cancer Brother        Bladder   Hypertension Brother    Hyperlipidemia Brother    Stroke Maternal Grandfather    Colon cancer Neg Hx    Colon polyps Neg Hx    Stomach cancer Neg Hx     SOCIAL HISTORY: Social History    Tobacco Use   Smoking status: Former    Packs/day: 0.50    Years: 20.00    Total pack years: 10.00    Types: Cigarettes   Smokeless tobacco: Never   Tobacco comments:    'quit smoking in the 1990's?"  Substance Use Topics   Alcohol use: Yes    Alcohol/week: 0.0 - 1.0 standard drinks of alcohol    Comment: rarely    No Known Allergies  Current Outpatient Medications  Medication Sig Dispense Refill   albuterol (PROVENTIL) (2.5 MG/3ML) 0.083% nebulizer solution Take 3 mLs (2.5 mg total) by nebulization every 6 (six) hours as needed for wheezing or shortness of breath. 75 mL 12   amLODipine (NORVASC) 5 MG tablet Take 5 mg by mouth daily.     aspirin EC 81 MG tablet 1 tablet Orally Once a day     atorvastatin (LIPITOR) 20 MG tablet Take one tablet by mouth at night.  If develop myalgias stop for 1-2 weeks then restart. Oral Once a day     clopidogrel (PLAVIX) 75 MG tablet Take 1 tablet by mouth daily.     estradiol (ESTRACE VAGINAL) 0.1 MG/GM vaginal cream Place 1 g vaginally 2 (two) times a week. 42.5 g 3   fenofibrate 160 MG tablet 160 mg by oral route.     fluticasone (FLONASE) 50 MCG/ACT nasal spray SPRAY 2 SPRAYS INTO EACH NOSTRIL EVERY DAY Nasal     Fluticasone-Umeclidin-Vilant (TRELEGY ELLIPTA) 100-62.5-25 MCG/ACT AEPB INHALE 1 PUFF BY MOUTH EVERY DAY 60 each 0   gabapentin (NEURONTIN) 100 MG capsule Take 100 mg by mouth at bedtime.     HYDROcodone-acetaminophen (NORCO) 10-325 MG tablet Take 1 tablet by mouth 3 (three) times daily as needed (pain).     ipratropium (ATROVENT) 0.03 % nasal spray 2 sprays in each nostril Nasally Twice a day     losartan (COZAAR) 100 MG tablet Take 100 mg by mouth every evening.     losartan (COZAAR) 50 MG tablet      mirtazapine (REMERON) 15 MG tablet Take 15 mg by mouth at bedtime.     montelukast (SINGULAIR) 10 MG tablet Take 10 mg by mouth at bedtime.   6   Multiple Vitamins-Minerals (MULTIVITAMIN PO) Take 1 tablet by mouth daily.       Multiple Vitamins-Minerals (PRESERVISION AREDS 2) CAPS Take 1 capsule by mouth 2 (two) times daily.     nitrofurantoin, macrocrystal-monohydrate, (MACROBID) 100 MG capsule 1 capsule with food Orally every 12 hrs for 7 days     omeprazole (PRILOSEC) 40 MG capsule omeprazole 40 mg capsule,delayed release   40 mg twice a day by oral route.     propranolol ER (INDERAL LA) 120 MG 24 hr capsule Take 120 mg by mouth daily.  3   No current facility-administered medications for this visit.    REVIEW OF SYSTEMS:  '[X]'$  denotes positive finding, '[ ]'$   denotes negative finding Cardiac  Comments:  Chest pain or chest pressure:    Shortness of breath upon exertion:    Short of breath when lying flat:    Irregular heart rhythm:        Vascular    Pain in calf, thigh, or hip brought on by ambulation:    Pain in feet at night that wakes you up from your sleep:     Blood clot in your veins:    Leg swelling:         Pulmonary    Oxygen at home:    Productive cough:     Wheezing:         Neurologic    Sudden weakness in arms or legs:     Sudden numbness in arms or legs:     Sudden onset of difficulty speaking or slurred speech:    Temporary loss of vision in one eye:     Problems with dizziness:         Gastrointestinal    Blood in stool:     Vomited blood:         Genitourinary    Burning when urinating:     Blood in urine:        Psychiatric    Major depression:         Hematologic    Bleeding problems:    Problems with blood clotting too easily:        Skin    Rashes or ulcers:        Constitutional    Fever or chills:      PHYSICAL EXAM: There were no vitals filed for this visit.   GENERAL: The patient is a well-nourished female, in no acute distress. The vital signs are documented above. CARDIAC: There is a regular rate and rhythm.  VASCULAR:  Bilateral femoral pulses palpable No palpable pedal pulses, no active tissue loss Brisk DP/PT signals bilateral lower  extremities  DATA:   No studies today  Assessment/Plan:  88 year old female presents for 3 month interval follow-up for surveillance of her mesenteric artery disease as well as peripheral vascular disease.  Previous celiac artery stent as documented above.  During her last visit 3 months ago there was concern about a recurrent high-grade stenosis in her celiac stent.  There is also concern for a high-grade SMA stenosis that we have previously demonstrated on angiogram is not significant.  No new signs of chronic mesenteric ischemia and no indication for intervention particularly with her having multiple groin complications in the past.  Also not having any lower extremity PAD symptoms.  I will see her in 6 months with ultrasound of her mesenteric arteries.  Discussed she call sooner with questions or concerns.   Marty Heck, MD [facility] Office: [phone]

## 2022-08-14 ENCOUNTER — Other Ambulatory Visit: Payer: Self-pay

## 2022-08-14 DIAGNOSIS — K551 Chronic vascular disorders of intestine: Secondary | ICD-10-CM

## 2022-08-31 ENCOUNTER — Ambulatory Visit (INDEPENDENT_AMBULATORY_CARE_PROVIDER_SITE_OTHER): Payer: Medicare Other | Admitting: Primary Care

## 2022-08-31 ENCOUNTER — Encounter: Payer: Self-pay | Admitting: Primary Care

## 2022-08-31 VITALS — BP 94/62 | HR 70 | Temp 98.0°F | Ht 62.0 in | Wt 81.8 lb

## 2022-08-31 DIAGNOSIS — J449 Chronic obstructive pulmonary disease, unspecified: Secondary | ICD-10-CM | POA: Insufficient documentation

## 2022-08-31 DIAGNOSIS — Z23 Encounter for immunization: Secondary | ICD-10-CM | POA: Diagnosis not present

## 2022-08-31 DIAGNOSIS — J438 Other emphysema: Secondary | ICD-10-CM | POA: Diagnosis not present

## 2022-08-31 LAB — SPIROMETRY WITH GRAPH

## 2022-08-31 MED ORDER — ALBUTEROL SULFATE HFA 108 (90 BASE) MCG/ACT IN AERS: 2.0000 | INHALATION_SPRAY | Freq: Four times a day (QID) | RESPIRATORY_TRACT | 2 refills | Status: DC | PRN

## 2022-08-31 MED ORDER — TRELEGY ELLIPTA 100-62.5-25 MCG/ACT IN AEPB: 62.5000 ug | INHALATION_SPRAY | Freq: Every day | RESPIRATORY_TRACT | 0 refills | Status: DC

## 2022-08-31 NOTE — Progress Notes (Signed)
$'@Patient'd$  ID: Jessica [name], female    DOB: 10-12-1932, 88 y.o.   MRN: [id number]  Chief Complaint  Patient presents with   Follow-up    Referring provider: Reynold Bowen, MD  HPI: 88 year old female, former smoker.  Past medical history significant for emphysema, hypertension, TIA, renal artery stenosis, PVD. Patient of Dr. Erin [name], last seen on 09/30/21.  Previous [facility] pulmonary encounter: 09/30/22- Dr. Ellamae [name] is an 88 year old [name], former smoker with peripheral vascular disease, hypertension and emphysema who returns to pulmonary clinic for follow up  Jessica [name] continues on Trelegy Ellipta daily and as needed albuterol. Jessica [name] is using albuterol infrequently. Jessica [name] is using ipratropium nasal spray as needed for runny nose with good efficacy.   Jessica [name] complains of sputum production in the morning time. Jessica [name] has not used her albuterol during these times. Jessica [name] does not have a flutter valve or nebulizer machine at home.   08/31/2022- Interim hx  Patient presents today for follow-up/emphysema. Jessica [name] has been consistently using Trelegy every day. For the longest time Trelegy helped her breathing.  Every once in a while Jessica [name] has breakthrough symptoms of chest heaviness and shortness of breath for a short period of time. Jessica [name] does not have a hand held rescue inhaler. Jessica [name] has morning congestion which is easily producible. Once Jessica [name] is able to cough up mucus Jessica [name] is fine for the rest of that day. No purulent mucus, wheezing, weight gain or swelling.   Pulmonary testing: 09/16/20 PFTs - FVC 1.90 (95%), FEV1 1.15 (79%), ratio 61 08/31/2022 Spirometry - FVC 1.46, FEV1 0.79, RATIO 54   No Known Allergies  Immunization History  Administered Date(s) Administered   Influenza,inj,quad, With Preservative 08/30/2017   PFIZER(Purple Top)SARS-COV-2 Vaccination 12/20/2019, 01/10/2020    Past Medical History:  Diagnosis Date   Anemia    Arthritis    "thumbs" (06/27/2015)   Bilateral renal artery stenosis (HCC)     Cataract    Chronic bronchitis (HCC)    Chronic lower back pain    COPD (chronic obstructive pulmonary disease) (HCC)    GERD (gastroesophageal reflux disease)    Headache    "probably weekly" (06/27/2015)   Hypertension    Hypertriglyceridemia    Migraine    "maybe monthly" (06/27/2015)   Pneumonia     Tobacco History: Social History   Tobacco Use  Smoking Status Former   Packs/day: 0.50   Years: 20.00   Total pack years: 10.00   Types: Cigarettes  Smokeless Tobacco Never  Tobacco Comments   'quit smoking in the 1990's?"   Counseling given: Not Answered Tobacco comments: 'quit smoking in the 1990's?"   Outpatient Medications Prior to Visit  Medication Sig Dispense Refill   albuterol (PROVENTIL) (2.5 MG/3ML) 0.083% nebulizer solution Take 3 mLs (2.5 mg total) by nebulization every 6 (six) hours as needed for wheezing or shortness of breath. 75 mL 12   amLODipine (NORVASC) 5 MG tablet Take 5 mg by mouth daily.     aspirin EC 81 MG tablet 1 tablet Orally Once a day     atorvastatin (LIPITOR) 20 MG tablet Take one tablet by mouth at night.  If develop myalgias stop for 1-2 weeks then restart. Oral Once a day     clopidogrel (PLAVIX) 75 MG tablet Take 1 tablet by mouth daily.     estradiol (ESTRACE VAGINAL) 0.1 MG/GM vaginal cream Place 1 g vaginally 2 (two) times a week. 42.5 g 3   Fluticasone-Umeclidin-Vilant (TRELEGY ELLIPTA)  100-62.5-25 MCG/ACT AEPB INHALE 1 PUFF BY MOUTH EVERY DAY 60 each 0   gabapentin (NEURONTIN) 100 MG capsule Take 100 mg by mouth at bedtime.     HYDROcodone-acetaminophen (NORCO) 10-325 MG tablet Take 1 tablet by mouth 3 (three) times daily as needed (pain).     ipratropium (ATROVENT) 0.03 % nasal spray 2 sprays in each nostril Nasally Twice a day     losartan (COZAAR) 100 MG tablet Take 100 mg by mouth every evening.     mirtazapine (REMERON) 15 MG tablet Take 15 mg by mouth at bedtime.     montelukast (SINGULAIR) 10 MG tablet Take 10 mg by mouth at  bedtime.   6   Multiple Vitamins-Minerals (MULTIVITAMIN PO) Take 1 tablet by mouth daily.      Multiple Vitamins-Minerals (PRESERVISION AREDS 2) CAPS Take 1 capsule by mouth 2 (two) times daily.     omeprazole (PRILOSEC) 40 MG capsule omeprazole 40 mg capsule,delayed release   40 mg twice a day by oral route.     propranolol ER (INDERAL LA) 120 MG 24 hr capsule Take 120 mg by mouth daily.  3   fenofibrate 160 MG tablet 160 mg by oral route.     fluticasone (FLONASE) 50 MCG/ACT nasal spray SPRAY 2 SPRAYS INTO EACH NOSTRIL EVERY DAY Nasal     losartan (COZAAR) 50 MG tablet      nitrofurantoin, macrocrystal-monohydrate, (MACROBID) 100 MG capsule 1 capsule with food Orally every 12 hrs for 7 days     No facility-administered medications prior to visit.    Review of Systems  Review of Systems  Constitutional: Negative.   HENT:  Positive for congestion.        Morning congestion   Respiratory:  Positive for cough.        Dyspnea with exertion  Cardiovascular: Negative.  Negative for chest pain, palpitations and leg swelling.   Physical Exam  BP 94/62 (BP Location: Left Arm, Patient Position: Sitting, Cuff Size: Normal)   Pulse 70   Temp 98 F (36.7 C) (Oral)   Ht '5\' 2"'$  (1.575 m)   Wt 81 lb 12.8 oz (37.1 kg)   LMP 11/30/1976   SpO2 96%   BMI 14.96 kg/m  Physical Exam Constitutional:      Appearance: Normal appearance.  HENT:     Head: Normocephalic and atraumatic.     Mouth/Throat:     Mouth: Mucous membranes are moist.     Pharynx: Oropharynx is clear.  Cardiovascular:     Rate and Rhythm: Normal rate and regular rhythm.  Pulmonary:     Effort: Pulmonary effort is normal.     Breath sounds: No wheezing, rhonchi or rales.     Comments: CTA Musculoskeletal:        General: Normal range of motion.  Skin:    General: Skin is warm and dry.  Neurological:     General: No focal deficit present.     Mental Status: Jessica [name]. Mental status is at baseline.  Psychiatric:         Mood and Affect: Mood normal.        Behavior: Behavior normal.        Thought Content: Thought content normal.        Judgment: Judgment normal.      Lab Results:  CBC    Component Value Date/Time   WBC 7.0 05/21/2021 0138   RBC 3.64 (L) 05/21/2021 0138   HGB 11.2 (L) 05/21/2021 0138  HCT 34.2 (L) 05/21/2021 0138   PLT 203 05/21/2021 0138   MCV 94.0 05/21/2021 0138   MCH 30.8 05/21/2021 0138   MCHC 32.7 05/21/2021 0138   RDW 15.6 (H) 05/21/2021 0138   LYMPHSABS 2.3 06/27/2015 0025   MONOABS 0.6 06/27/2015 0025   EOSABS 0.3 06/27/2015 0025   BASOSABS 0.1 06/27/2015 0025    BMET    Component Value Date/Time   NA 134 (L) 05/21/2021 0138   K 3.9 05/21/2021 0138   CL 102 05/21/2021 0138   CO2 22 05/21/2021 0138   GLUCOSE 251 (H) 05/21/2021 0138   BUN 14 05/21/2021 0138   CREATININE 0.70 05/21/2021 0138   CALCIUM 8.4 (L) 05/21/2021 0138   GFRNONAA >60 05/21/2021 0138   GFRAA >60 07/16/2020 0235    BNP No results found for: "BNP"  ProBNP No results found for: "PROBNP"  Imaging: No results found.   Assessment & Plan:   Other emphysema ([place]) - Increased symptoms. Trelegy has not been as effective as previously was. Her lung function has decreased since 2021. Jessica [name] had borderline BD on PFTs in the past. We will trial Trelegy 266mg, [facility] Advised Jessica [name] take mucinex at bedtime d/t morning congestion. May benefit from pulmonary rehab. FU in 3 months or sooner if needed.    EMartyn Ehrich NP 08/31/2022

## 2022-08-31 NOTE — Addendum Note (Signed)
Addended by: Irine Seal B on: 08/31/2022 03:26 PM   Modules accepted: Orders

## 2022-08-31 NOTE — Assessment & Plan Note (Addendum)
-   Increased symptoms. Trelegy has not been as effective as previously was. Her lung function has decreased since 2021. She had borderline BD on PFTs in the past. We will trial Trelegy 268mg, [facility]. Advised she take mucinex at bedtime d/t morning congestion. May benefit from pulmonary rehab. FU in 3 months or sooner if needed.

## 2022-08-31 NOTE — Patient Instructions (Addendum)
Recommendations: - Start sample Trelegy 200 mcg- take 1 puff daily in the morning (take dose today at 3 PM, tomorrow at noon, following day at 9 AM) - Start Mucinex 600 mg at bedtime - Use albuterol rescue inhaler 2 puffs every 4-6 hours as needed for breakthrough shortness of breath or wheezing; use nebulizer if rescue inhaler is ineffective - Continue Singulair 10 mg at bedtime - Continue to use Flonase every other day as needed for nasal congestion/postnasal drip - Please let me know in the next week or 2 if increased trelegy dose helped your symptoms  Follow-up: 3 months with Dr. Erin Fulling or sooner if needed

## 2022-09-01 DIAGNOSIS — H353221 Exudative age-related macular degeneration, left eye, with active choroidal neovascularization: Secondary | ICD-10-CM | POA: Diagnosis not present

## 2022-09-01 DIAGNOSIS — H43813 Vitreous degeneration, bilateral: Secondary | ICD-10-CM | POA: Diagnosis not present

## 2022-09-05 DIAGNOSIS — Z23 Encounter for immunization: Secondary | ICD-10-CM | POA: Diagnosis not present

## 2022-09-14 ENCOUNTER — Telehealth: Payer: Self-pay | Admitting: Primary Care

## 2022-09-14 MED ORDER — TRELEGY ELLIPTA 200-62.5-25 MCG/ACT IN AEPB: 1.0000 | INHALATION_SPRAY | Freq: Every day | RESPIRATORY_TRACT | 3 refills | Status: DC

## 2022-09-14 NOTE — Telephone Encounter (Signed)
Called and spoke to patient about refills. She states that the trleegy 200 is working well. Verified pharmacy. Nothing further needed

## 2022-10-26 DIAGNOSIS — M5459 Other low back pain: Secondary | ICD-10-CM | POA: Diagnosis not present

## 2022-10-26 DIAGNOSIS — M5416 Radiculopathy, lumbar region: Secondary | ICD-10-CM | POA: Diagnosis not present

## 2022-10-26 DIAGNOSIS — G894 Chronic pain syndrome: Secondary | ICD-10-CM | POA: Diagnosis not present

## 2022-10-26 DIAGNOSIS — G4489 Other headache syndrome: Secondary | ICD-10-CM | POA: Diagnosis not present

## 2022-11-03 DIAGNOSIS — J432 Centrilobular emphysema: Secondary | ICD-10-CM | POA: Diagnosis not present

## 2022-11-03 DIAGNOSIS — I774 Celiac artery compression syndrome: Secondary | ICD-10-CM | POA: Diagnosis not present

## 2022-11-03 DIAGNOSIS — Z95820 Peripheral vascular angioplasty status with implants and grafts: Secondary | ICD-10-CM | POA: Diagnosis not present

## 2022-11-03 DIAGNOSIS — I701 Atherosclerosis of renal artery: Secondary | ICD-10-CM | POA: Diagnosis not present

## 2022-11-03 DIAGNOSIS — R7301 Impaired fasting glucose: Secondary | ICD-10-CM | POA: Diagnosis not present

## 2022-11-03 DIAGNOSIS — E871 Hypo-osmolality and hyponatremia: Secondary | ICD-10-CM | POA: Diagnosis not present

## 2022-11-03 DIAGNOSIS — E785 Hyperlipidemia, unspecified: Secondary | ICD-10-CM | POA: Diagnosis not present

## 2022-11-03 DIAGNOSIS — K589 Irritable bowel syndrome without diarrhea: Secondary | ICD-10-CM | POA: Diagnosis not present

## 2022-11-03 DIAGNOSIS — G43909 Migraine, unspecified, not intractable, without status migrainosus: Secondary | ICD-10-CM | POA: Diagnosis not present

## 2022-11-03 DIAGNOSIS — I700 Atherosclerosis of aorta: Secondary | ICD-10-CM | POA: Diagnosis not present

## 2022-11-03 DIAGNOSIS — N1831 Chronic kidney disease, stage 3a: Secondary | ICD-10-CM | POA: Diagnosis not present

## 2022-11-03 DIAGNOSIS — I129 Hypertensive chronic kidney disease with stage 1 through stage 4 chronic kidney disease, or unspecified chronic kidney disease: Secondary | ICD-10-CM | POA: Diagnosis not present

## 2022-12-01 DIAGNOSIS — H43813 Vitreous degeneration, bilateral: Secondary | ICD-10-CM | POA: Diagnosis not present

## 2022-12-01 DIAGNOSIS — H35033 Hypertensive retinopathy, bilateral: Secondary | ICD-10-CM | POA: Diagnosis not present

## 2022-12-01 DIAGNOSIS — H353112 Nonexudative age-related macular degeneration, right eye, intermediate dry stage: Secondary | ICD-10-CM | POA: Diagnosis not present

## 2022-12-01 DIAGNOSIS — H353221 Exudative age-related macular degeneration, left eye, with active choroidal neovascularization: Secondary | ICD-10-CM | POA: Diagnosis not present

## 2022-12-28 DIAGNOSIS — L820 Inflamed seborrheic keratosis: Secondary | ICD-10-CM | POA: Diagnosis not present

## 2022-12-28 DIAGNOSIS — D485 Neoplasm of uncertain behavior of skin: Secondary | ICD-10-CM | POA: Diagnosis not present

## 2022-12-28 DIAGNOSIS — Z85828 Personal history of other malignant neoplasm of skin: Secondary | ICD-10-CM | POA: Diagnosis not present

## 2022-12-28 DIAGNOSIS — L821 Other seborrheic keratosis: Secondary | ICD-10-CM | POA: Diagnosis not present

## 2023-01-04 ENCOUNTER — Encounter: Payer: Self-pay | Admitting: Pulmonary Disease

## 2023-01-04 ENCOUNTER — Ambulatory Visit (INDEPENDENT_AMBULATORY_CARE_PROVIDER_SITE_OTHER): Payer: Medicare Other | Admitting: Pulmonary Disease

## 2023-01-04 VITALS — BP 116/72 | HR 55 | Ht 62.0 in

## 2023-01-04 DIAGNOSIS — J439 Emphysema, unspecified: Secondary | ICD-10-CM

## 2023-01-04 MED ORDER — TRELEGY ELLIPTA 200-62.5-25 MCG/ACT IN AEPB: 1.0000 | INHALATION_SPRAY | Freq: Every day | RESPIRATORY_TRACT | 0 refills | Status: DC

## 2023-01-04 NOTE — Patient Instructions (Addendum)
Try using ipratropium nasal spray, 2 sprays per nostril at night for post nasal drainage  Recommend using albuterol nebulizer treatment in the morning time followed by flutter valve to help clear chest congestion  We will order you a flutter valve if we do not have one in stock today  Continue trelegy ellipta 1 puff daily - rinse mouth out after each use  Follow up in 1 year, please call sooner if needed

## 2023-01-04 NOTE — Progress Notes (Unsigned)
Synopsis: Return visit for emphysema  Subjective:   PATIENT ID: Jessica [name] GENDER: female DOB: [birth year], MRN: [id number]  HPI  Chief Complaint  Patient presents with   Follow-up    F/U on emphysema. States breathing has been stable since last visit. Pharmacy has been having a hard time keeping Trelegy in stock.    Jessica [name] is an 89 year old woman, former smoker with peripheral vascular disease, hypertension and emphysema who returns to pulmonary clinic for follow up  She saw Derl Barrow, NP on 08/31/22 and given sample of trelegy which she liked. She continues on trelegy 1 puff daily. She has not required albuterol use. She has increased chest congestion in the AM until she is able to clear it out with deep breathing exercises. She will sometimes have increase congestion after eating. Denies dysphagia or food getting stuck in throat or chest.  OV 09/30/21 She continues on Trelegy Ellipta daily and as needed albuterol. She is using albuterol infrequently. She is using ipratropium nasal spray as needed for runny nose with good efficacy.   She complains of sputum production in the morning time. She has not used her albuterol during these times. She does not have a flutter valve or nebulizer machine at home.   Past Medical History:  Diagnosis Date   Anemia    Arthritis    "thumbs" (06/27/2015)   Bilateral renal artery stenosis (HCC)    Cataract    Chronic bronchitis (HCC)    Chronic lower back pain    COPD (chronic obstructive pulmonary disease) (HCC)    GERD (gastroesophageal reflux disease)    Headache    "probably weekly" (06/27/2015)   Hypertension    Hypertriglyceridemia    Migraine    "maybe monthly" (06/27/2015)   Pneumonia      Family History  Problem Relation Age of Onset   Parkinsonism Father    Cancer Brother        Bladder   Hypertension Brother    Hyperlipidemia Brother    Stroke Maternal Grandfather    Colon cancer Neg Hx    Colon polyps Neg Hx     Stomach cancer Neg Hx      Social History   Socioeconomic History   Marital status: Widowed    Spouse name: Not on file   Number of children: 1   Years of education: Not on file   Highest education level: Not on file  Occupational History   Occupation: retired  Tobacco Use   Smoking status: Former    Packs/day: 0.50    Years: 20.00    Total pack years: 10.00    Types: Cigarettes   Smokeless tobacco: Never   Tobacco comments:    'quit smoking in the 1990's?"  Vaping Use   Vaping Use: Never used  Substance and Sexual Activity   Alcohol use: Yes    Alcohol/week: 0.0 - 1.0 standard drinks of alcohol    Comment: rarely   Drug use: No   Sexual activity: Not Currently    Partners: Male    Birth control/protection: Surgical, Post-menopausal    Comment: hysterectomy  Other Topics Concern   Not on file  Social History Narrative   Not on file   Social Determinants of Health   Financial Resource Strain: Not on file  Food Insecurity: Not on file  Transportation Needs: Not on file  Physical Activity: Not on file  Stress: Not on file  Social Connections: Not on file  Intimate  Partner Violence: Not on file     No Known Allergies   Outpatient Medications Prior to Visit  Medication Sig Dispense Refill   albuterol (PROVENTIL) (2.5 MG/3ML) 0.083% nebulizer solution Take 3 mLs (2.5 mg total) by nebulization every 6 (six) hours as needed for wheezing or shortness of breath. 75 mL 12   albuterol (VENTOLIN HFA) 108 (90 Base) MCG/ACT inhaler Inhale 2 puffs into the lungs every 6 (six) hours as needed for wheezing or shortness of breath. 8 g 2   amLODipine (NORVASC) 5 MG tablet Take 5 mg by mouth daily.     aspirin EC 81 MG tablet 1 tablet Orally Once a day     atorvastatin (LIPITOR) 20 MG tablet Take one tablet by mouth at night.  If develop myalgias stop for 1-2 weeks then restart. Oral Once a day     clopidogrel (PLAVIX) 75 MG tablet Take 1 tablet by mouth daily.     estradiol  (ESTRACE VAGINAL) 0.1 MG/GM vaginal cream Place 1 g vaginally 2 (two) times a week. 42.5 g 3   Fluticasone-Umeclidin-Vilant (TRELEGY ELLIPTA) 200-62.5-25 MCG/ACT AEPB Inhale 1 puff into the lungs daily. 60 each 3   gabapentin (NEURONTIN) 100 MG capsule Take 100 mg by mouth at bedtime.     HYDROcodone-acetaminophen (NORCO) 10-325 MG tablet Take 1 tablet by mouth 3 (three) times daily as needed (pain).     ipratropium (ATROVENT) 0.03 % nasal spray 2 sprays in each nostril Nasally Twice a day     losartan (COZAAR) 100 MG tablet Take 100 mg by mouth every evening.     mirtazapine (REMERON) 15 MG tablet Take 15 mg by mouth at bedtime.     montelukast (SINGULAIR) 10 MG tablet Take 10 mg by mouth at bedtime.   6   Multiple Vitamins-Minerals (MULTIVITAMIN PO) Take 1 tablet by mouth daily.      Multiple Vitamins-Minerals (PRESERVISION AREDS 2) CAPS Take 1 capsule by mouth 2 (two) times daily.     omeprazole (PRILOSEC) 40 MG capsule omeprazole 40 mg capsule,delayed release   40 mg twice a day by oral route.     propranolol ER (INDERAL LA) 120 MG 24 hr capsule Take 120 mg by mouth daily.  3   Fluticasone-Umeclidin-Vilant (TRELEGY ELLIPTA) 100-62.5-25 MCG/ACT AEPB INHALE 1 PUFF BY MOUTH EVERY DAY 60 each 0   Fluticasone-Umeclidin-Vilant (TRELEGY ELLIPTA) 100-62.5-25 MCG/ACT AEPB Inhale 62.5 mcg into the lungs daily. 28 each 0   No facility-administered medications prior to visit.    Review of Systems  Constitutional:  Negative for chills, fever, malaise/fatigue and weight loss.  HENT:         Rhinitis and post nasal drip  Eyes: Negative.   Respiratory:  Positive for cough and sputum production. Negative for hemoptysis, shortness of breath and wheezing.   Cardiovascular:  Negative for chest pain, palpitations, orthopnea, claudication and leg swelling.  Gastrointestinal:  Negative for abdominal pain, heartburn, nausea and vomiting.  Genitourinary: Negative.   Musculoskeletal: Negative.   Neurological:   Negative for dizziness and headaches.  Psychiatric/Behavioral: Negative.      Objective:   Vitals:   01/04/23 1054  BP: 116/72  Pulse: (!) 55  SpO2: 96%  Height: '5\' 2"'$  (1.575 m)   Physical Exam Constitutional:      Comments: thin  HENT:     Head: Normocephalic and atraumatic.  Cardiovascular:     Rate and Rhythm: Normal rate and regular rhythm.     Pulses: Normal pulses.  Heart sounds: Normal heart sounds.  Pulmonary:     Effort: Pulmonary effort is normal.     Breath sounds: Normal breath sounds.  Abdominal:     General: Bowel sounds are normal.     Palpations: Abdomen is soft.  Musculoskeletal:     Right lower leg: No edema.     Left lower leg: No edema.  Skin:    General: Skin is warm and dry.     Capillary Refill: Capillary refill takes less than 2 seconds.  Neurological:     General: No focal deficit present.     Mental Status: She is alert.     CBC    Component Value Date/Time   WBC 7.0 05/21/2021 0138   RBC 3.64 (L) 05/21/2021 0138   HGB 11.2 (L) 05/21/2021 0138   HCT 34.2 (L) 05/21/2021 0138   PLT 203 05/21/2021 0138   MCV 94.0 05/21/2021 0138   MCH 30.8 05/21/2021 0138   MCHC 32.7 05/21/2021 0138   RDW 15.6 (H) 05/21/2021 0138   LYMPHSABS 2.3 06/27/2015 0025   MONOABS 0.6 06/27/2015 0025   EOSABS 0.3 06/27/2015 0025   BASOSABS 0.1 06/27/2015 0025   Chest imaging: CT Chest 07/15/20 1. No CT evidence for acute intrathoracic abnormality. Negative for pneumothorax. 2. Acute angular fracture deformity involving the midportion of the sternal body with mild surrounding edema and hematoma. 3. Suspected acute mild superior endplate deformity at T1, T2 and T3 with possible vertical fracture lucency through the anterior vertebral bodies at T1 and T2. 4. Emphysematous disease  PFT:    Latest Ref Rng & Units 09/16/2020   10:52 AM  PFT Results  FVC-Pre L 1.74   FVC-Predicted Pre % 87   FVC-Post L 1.90   FVC-Predicted Post % 95   Pre FEV1/FVC  % % 60   Post FEV1/FCV % % 61   FEV1-Pre L 1.04   FEV1-Predicted Pre % 72   FEV1-Post L 1.15   DLCO uncorrected ml/min/mmHg 4.60   DLCO UNC% % 27   DLCO corrected ml/min/mmHg 4.60   DLCO COR %Predicted % 27   DLVA Predicted % 32    Echo: 06/28/15 - Left ventricle: The cavity size was normal. Wall thickness was    increased in a pattern of moderate LVH. Systolic function was    vigorous. The estimated ejection fraction was in the range of 65%    to 70%. Wall motion was normal; there were no regional wall    motion abnormalities. Doppler parameters are consistent with    abnormal left ventricular relaxation (grade 1 diastolic    dysfunction). Doppler parameters are consistent with high    ventricular filling pressure.    Assessment & Plan:   No diagnosis found.  Discussion: Jessica [name] is an 89 year old woman, former smoker with emphysema, peripheral vascular disease and hypertension who returns to pulmonary clinic for follow up.  She has mild obstructive defect and severe diffusion defect on pulmonary function testing. She is to continue on trelegy ellipta daily and as needed albuterol.   She is to use albuterol nebulizer treatment and flutter valve each morning for airway clearance.  She is to continue as needed ipratropium nasal spray for chronic rhinits.    Follow up in 1 year.   Freda Jackson, MD [facility] Office: [phone]    Current Outpatient Medications:    albuterol (PROVENTIL) (2.5 MG/3ML) 0.083% nebulizer solution, Take 3 mLs (2.5 mg total) by nebulization every 6 (  six) hours as needed for wheezing or shortness of breath., Disp: 75 mL, Rfl: 12   albuterol (VENTOLIN HFA) 108 (90 Base) MCG/ACT inhaler, Inhale 2 puffs into the lungs every 6 (six) hours as needed for wheezing or shortness of breath., Disp: 8 g, Rfl: 2   amLODipine (NORVASC) 5 MG tablet, Take 5 mg by mouth daily., Disp: , Rfl:    aspirin EC 81 MG tablet, 1 tablet Orally  Once a day, Disp: , Rfl:    atorvastatin (LIPITOR) 20 MG tablet, Take one tablet by mouth at night.  If develop myalgias stop for 1-2 weeks then restart. Oral Once a day, Disp: , Rfl:    clopidogrel (PLAVIX) 75 MG tablet, Take 1 tablet by mouth daily., Disp: , Rfl:    estradiol (ESTRACE VAGINAL) 0.1 MG/GM vaginal cream, Place 1 g vaginally 2 (two) times a week., Disp: 42.5 g, Rfl: 3   Fluticasone-Umeclidin-Vilant (TRELEGY ELLIPTA) 200-62.5-25 MCG/ACT AEPB, Inhale 1 puff into the lungs daily., Disp: 60 each, Rfl: 3   Fluticasone-Umeclidin-Vilant (TRELEGY ELLIPTA) 200-62.5-25 MCG/ACT AEPB, Inhale 1 puff into the lungs daily., Disp: 2 each, Rfl: 0   gabapentin (NEURONTIN) 100 MG capsule, Take 100 mg by mouth at bedtime., Disp: , Rfl:    HYDROcodone-acetaminophen (NORCO) 10-325 MG tablet, Take 1 tablet by mouth 3 (three) times daily as needed (pain)., Disp: , Rfl:    ipratropium (ATROVENT) 0.03 % nasal spray, 2 sprays in each nostril Nasally Twice a day, Disp: , Rfl:    losartan (COZAAR) 100 MG tablet, Take 100 mg by mouth every evening., Disp: , Rfl:    mirtazapine (REMERON) 15 MG tablet, Take 15 mg by mouth at bedtime., Disp: , Rfl:    montelukast (SINGULAIR) 10 MG tablet, Take 10 mg by mouth at bedtime. , Disp: , Rfl: 6   Multiple Vitamins-Minerals (MULTIVITAMIN PO), Take 1 tablet by mouth daily. , Disp: , Rfl:    Multiple Vitamins-Minerals (PRESERVISION AREDS 2) CAPS, Take 1 capsule by mouth 2 (two) times daily., Disp: , Rfl:    omeprazole (PRILOSEC) 40 MG capsule, omeprazole 40 mg capsule,delayed release   40 mg twice a day by oral route., Disp: , Rfl:    propranolol ER (INDERAL LA) 120 MG 24 hr capsule, Take 120 mg by mouth daily., Disp: , Rfl: 3

## 2023-01-06 ENCOUNTER — Encounter: Payer: Self-pay | Admitting: Pulmonary Disease

## 2023-01-13 ENCOUNTER — Other Ambulatory Visit: Payer: Self-pay | Admitting: Primary Care

## 2023-01-15 ENCOUNTER — Other Ambulatory Visit: Payer: Self-pay | Admitting: Primary Care

## 2023-02-12 ENCOUNTER — Other Ambulatory Visit: Payer: Self-pay | Admitting: Pulmonary Disease

## 2023-02-16 ENCOUNTER — Encounter: Payer: Self-pay | Admitting: Vascular Surgery

## 2023-02-16 ENCOUNTER — Ambulatory Visit (HOSPITAL_COMMUNITY)
Admission: RE | Admit: 2023-02-16 | Discharge: 2023-02-16 | Disposition: A | Discharge status: Home / Self Care | Payer: Medicare Other | Source: Ambulatory Visit | Attending: Vascular Surgery | Admitting: Vascular Surgery

## 2023-02-16 ENCOUNTER — Ambulatory Visit (INDEPENDENT_AMBULATORY_CARE_PROVIDER_SITE_OTHER): Payer: Medicare Other | Admitting: Vascular Surgery

## 2023-02-16 VITALS — BP 155/61 | HR 66 | Temp 96.8°F | Resp 14

## 2023-02-16 DIAGNOSIS — K551 Chronic vascular disorders of intestine: Secondary | ICD-10-CM

## 2023-02-16 NOTE — Progress Notes (Signed)
Patient name: Jessica [name] MRN: [id number] DOB: 03-Sep-1932 Sex: female  REASON FOR VISIT: 6 month follow-up, mesenteric artery disease  HPI: Jessica [name] is a 89 y.o. female well-known to vascular surgery that presents for 6 month interval follow-up for surveillance of her mesenteric artery disease.    She initially underwent angioplasty and stent of her celiac artery on 05/15/2020 for mesenteric ischemia with chronic  gastritis.  There was also question of an SMA stenosis that was widely patent on angiogram and no intervention was performed on the SMA.  She had complication of closure device in the right groin requiring common femoral endarterectomy.  She then underwent celiac stent angioplasty on 05/15/2021 for an in-stent stenosis.  We used a Celt closure device in the left common femoral artery and ultimately she had a complication of the closure device and developed a left common femoral occlusion.  She subsequently underwent a left common femoral endarterectomy with bovine patch with findings of a dissection on 05/20/2021.    Today she states she is having more weight loss.  She blames this on no appetite.  At times she has intermittent abdominal pain usually late in the day.  She states post-prandial abdominal pain is not driving her loss of appetite.    Past Medical History:  Diagnosis Date   Anemia    Arthritis    "thumbs" (06/27/2015)   Bilateral renal artery stenosis (HCC)    Cataract    Chronic bronchitis (HCC)    Chronic lower back pain    COPD (chronic obstructive pulmonary disease) (HCC)    GERD (gastroesophageal reflux disease)    Headache    "probably weekly" (06/27/2015)   Hypertension    Hypertriglyceridemia    Migraine    "maybe monthly" (06/27/2015)   Pneumonia     Past Surgical History:  Procedure Laterality Date   BALLOON DILATION  1980's?   "for renal stenosis"   CATARACT EXTRACTION, BILATERAL     ENDARTERECTOMY FEMORAL Right 05/15/2020   Procedure: Right  Common Femoral Endarterectomy;  Surgeon: Marty Heck, MD;  Location: [facility] OR;  Service: Vascular;  Laterality: Right;   FEMORAL ARTERY EXPLORATION Left 05/20/2021   Procedure: LEFT LEG THROMBECTOMY WITH LEFT FEMORAL  ENDARTERECTOMY AND PATCH ANGIOPLASTY;  Surgeon: Rosetta Posner, MD;  Location: [facility] OR;  Service: Vascular;  Laterality: Left;   PATCH ANGIOPLASTY Right 05/15/2020   Procedure: Common Femoral Artery Patch Angioplasty using XenoSure Bovine Pericardial Patch;  Surgeon: Marty Heck, MD;  Location: [place];  Service: Vascular;  Laterality: Right;   PERIPHERAL VASCULAR BALLOON ANGIOPLASTY  05/15/2021   Procedure: PERIPHERAL VASCULAR BALLOON ANGIOPLASTY;  Surgeon: Marty Heck, MD;  Location: [place] [facility];  Service: Cardiovascular;;   Celiac   PERIPHERAL VASCULAR INTERVENTION  05/15/2020   Procedure: PERIPHERAL VASCULAR INTERVENTION;  Surgeon: Marty Heck, MD;  Location: [place] [facility];  Service: Cardiovascular;;  Celiac   RECONSTRUCTION OF EYELID Bilateral 01/2016   Upper and lower eyelid   RENAL ANGIOPLASTY     TOE SURGERY Bilateral    "straightened big toe"   TONSILLECTOMY  ~ [place] N/A 05/15/2020   Procedure: MESENTERIC  ANGIOGRAPHY;  Surgeon: Marty Heck, MD;  Location: [place] [facility];  Service: Cardiovascular;  Laterality: N/A;   VISCERAL ANGIOGRAPHY N/A 05/15/2021   Procedure: MESENTRIC ANGIOGRAPHY;  Surgeon: Marty Heck, MD;  Location: [place] [facility];  Service: Cardiovascular;  Laterality: N/A;    Family History  Problem Relation Age of Onset   Parkinsonism Father    Cancer Brother        Bladder   Hypertension Brother    Hyperlipidemia Brother    Stroke Maternal Grandfather    Colon cancer Neg Hx    Colon polyps Neg Hx    Stomach cancer Neg Hx     SOCIAL HISTORY: Social History   Tobacco Use   Smoking status: Former    Packs/day: 0.50    Years: 20.00     Additional pack years: 0.00    Total pack years: 10.00    Types: Cigarettes   Smokeless tobacco: Never   Tobacco comments:    'quit smoking in the 1990's?"  Substance Use Topics   Alcohol use: Yes    Alcohol/week: 0.0 - 1.0 standard drinks of alcohol    Comment: rarely    No Known Allergies  Current Outpatient Medications  Medication Sig Dispense Refill   albuterol (PROVENTIL) (2.5 MG/3ML) 0.083% nebulizer solution Take 3 mLs (2.5 mg total) by nebulization every 6 (six) hours as needed for wheezing or shortness of breath. 75 mL 12   albuterol (VENTOLIN HFA) 108 (90 Base) MCG/ACT inhaler Inhale 2 puffs into the lungs every 6 (six) hours as needed for wheezing or shortness of breath. 8 g 2   amLODipine (NORVASC) 5 MG tablet Take 5 mg by mouth daily.     aspirin EC 81 MG tablet 1 tablet Orally Once a day     atorvastatin (LIPITOR) 20 MG tablet Take one tablet by mouth at night.  If develop myalgias stop for 1-2 weeks then restart. Oral Once a day     clopidogrel (PLAVIX) 75 MG tablet Take 1 tablet by mouth daily.     estradiol (ESTRACE VAGINAL) 0.1 MG/GM vaginal cream Place 1 g vaginally 2 (two) times a week. 42.5 g 3   gabapentin (NEURONTIN) 100 MG capsule Take 100 mg by mouth at bedtime.     HYDROcodone-acetaminophen (NORCO) 10-325 MG tablet Take 1 tablet by mouth 3 (three) times daily as needed (pain).     ipratropium (ATROVENT) 0.03 % nasal spray PLACE 2 SPRAYS INTO BOTH NOSTRILS 2 (TWO) TIMES DAILY AS NEEDED FOR RHINITIS. 90 mL 1   losartan (COZAAR) 100 MG tablet Take 100 mg by mouth every evening.     mirtazapine (REMERON) 15 MG tablet Take 15 mg by mouth at bedtime.     montelukast (SINGULAIR) 10 MG tablet Take 10 mg by mouth at bedtime.   6   Multiple Vitamins-Minerals (MULTIVITAMIN PO) Take 1 tablet by mouth daily.      Multiple Vitamins-Minerals (PRESERVISION AREDS 2) CAPS Take 1 capsule by mouth 2 (two) times daily.     omeprazole (PRILOSEC) 40 MG capsule omeprazole 40 mg  capsule,delayed release   40 mg twice a day by oral route.     propranolol ER (INDERAL LA) 120 MG 24 hr capsule Take 120 mg by mouth daily.  3   TRELEGY ELLIPTA 200-62.5-25 MCG/ACT AEPB TAKE 1 PUFF BY MOUTH EVERY DAY 60 each 3   Fluticasone-Umeclidin-Vilant (TRELEGY ELLIPTA) 200-62.5-25 MCG/ACT AEPB Inhale 1 puff into the lungs daily. (Patient not taking: Reported on 02/16/2023) 2 each 0   TRELEGY ELLIPTA 200-62.5-25 MCG/ACT AEPB TAKE 1 PUFF BY MOUTH EVERY DAY 60 each 3   No current facility-administered medications for this visit.    REVIEW OF SYSTEMS:  [X]  denotes positive finding, [ ]  denotes negative  finding Cardiac  Comments:  Chest pain or chest pressure:    Shortness of breath upon exertion:    Short of breath when lying flat:    Irregular heart rhythm:        Vascular    Pain in calf, thigh, or hip brought on by ambulation:    Pain in feet at night that wakes you up from your sleep:     Blood clot in your veins:    Leg swelling:         Pulmonary    Oxygen at home:    Productive cough:     Wheezing:         Neurologic    Sudden weakness in arms or legs:     Sudden numbness in arms or legs:     Sudden onset of difficulty speaking or slurred speech:    Temporary loss of vision in one eye:     Problems with dizziness:         Gastrointestinal    Blood in stool:     Vomited blood:         Genitourinary    Burning when urinating:     Blood in urine:        Psychiatric    Major depression:         Hematologic    Bleeding problems:    Problems with blood clotting too easily:        Skin    Rashes or ulcers:        Constitutional    Fever or chills:      PHYSICAL EXAM: Vitals:   02/16/23 1027  BP: (!) 155/61  Pulse: 66  Resp: 14  Temp: (!) 96.8 F (36 C)  TempSrc: Temporal  SpO2: 94%     GENERAL: The patient is a well-nourished female, in no acute distress. The vital signs are documented above. CARDIAC: There is a regular rate and rhythm.   VASCULAR:  Bilateral femoral pulses palpable No palpable pedal pulses, no active tissue loss   DATA:   Mesenteric duplex today shows high-grade stenosis in celiac stent with velocity 567/166 and high-grade stenosis in SMA with velocity 554/72  Assessment/Plan:  89 year old female presents for 6 month interval follow-up for surveillance of her mesenteric artery disease.  I discussed that her mesenteric duplex today shows evidence of high-grade stenosis in the celiac stent as well as a high-grade stenosis in the SMA.  We have previously been pursuing surveillance given she had no associated symptoms to warrant further intervention.  Today she does describe increased weight loss and some intermittent abdominal pain.  I have offered her mesenteric angiogram for additional evaluation with possible intervention.  She thinks a lot of her issue are related to loss of appetite and not abdominal pain.  She does not feel that postprandial pain or food fear is the reason she is losing weight.  She is also scared of additional intervention having had groin complications in the past which is understandable.  I am going to see her in 3 months for close interval follow-up.  She would like to delay any invasive intervention at this time.   Marty Heck, MD [facility] Office: [phone]

## 2023-02-19 DIAGNOSIS — H18413 Arcus senilis, bilateral: Secondary | ICD-10-CM | POA: Diagnosis not present

## 2023-02-19 DIAGNOSIS — H04123 Dry eye syndrome of bilateral lacrimal glands: Secondary | ICD-10-CM | POA: Diagnosis not present

## 2023-02-19 DIAGNOSIS — H353131 Nonexudative age-related macular degeneration, bilateral, early dry stage: Secondary | ICD-10-CM | POA: Diagnosis not present

## 2023-02-19 DIAGNOSIS — Z961 Presence of intraocular lens: Secondary | ICD-10-CM | POA: Diagnosis not present

## 2023-03-04 DIAGNOSIS — Z79899 Other long term (current) drug therapy: Secondary | ICD-10-CM | POA: Diagnosis not present

## 2023-03-04 DIAGNOSIS — Z79891 Long term (current) use of opiate analgesic: Secondary | ICD-10-CM | POA: Diagnosis not present

## 2023-03-04 DIAGNOSIS — T402X5D Adverse effect of other opioids, subsequent encounter: Secondary | ICD-10-CM | POA: Diagnosis not present

## 2023-03-04 DIAGNOSIS — M5459 Other low back pain: Secondary | ICD-10-CM | POA: Diagnosis not present

## 2023-03-04 DIAGNOSIS — M5416 Radiculopathy, lumbar region: Secondary | ICD-10-CM | POA: Diagnosis not present

## 2023-03-04 DIAGNOSIS — Z5181 Encounter for therapeutic drug level monitoring: Secondary | ICD-10-CM | POA: Diagnosis not present

## 2023-03-04 DIAGNOSIS — M65331 Trigger finger, right middle finger: Secondary | ICD-10-CM | POA: Diagnosis not present

## 2023-03-04 DIAGNOSIS — G894 Chronic pain syndrome: Secondary | ICD-10-CM | POA: Diagnosis not present

## 2023-03-08 DIAGNOSIS — J432 Centrilobular emphysema: Secondary | ICD-10-CM | POA: Diagnosis not present

## 2023-03-08 DIAGNOSIS — E871 Hypo-osmolality and hyponatremia: Secondary | ICD-10-CM | POA: Diagnosis not present

## 2023-03-08 DIAGNOSIS — K589 Irritable bowel syndrome without diarrhea: Secondary | ICD-10-CM | POA: Diagnosis not present

## 2023-03-08 DIAGNOSIS — E785 Hyperlipidemia, unspecified: Secondary | ICD-10-CM | POA: Diagnosis not present

## 2023-03-08 DIAGNOSIS — Z95820 Peripheral vascular angioplasty status with implants and grafts: Secondary | ICD-10-CM | POA: Diagnosis not present

## 2023-03-08 DIAGNOSIS — M5416 Radiculopathy, lumbar region: Secondary | ICD-10-CM | POA: Diagnosis not present

## 2023-03-08 DIAGNOSIS — I129 Hypertensive chronic kidney disease with stage 1 through stage 4 chronic kidney disease, or unspecified chronic kidney disease: Secondary | ICD-10-CM | POA: Diagnosis not present

## 2023-03-08 DIAGNOSIS — R7301 Impaired fasting glucose: Secondary | ICD-10-CM | POA: Diagnosis not present

## 2023-03-08 DIAGNOSIS — D649 Anemia, unspecified: Secondary | ICD-10-CM | POA: Diagnosis not present

## 2023-03-08 DIAGNOSIS — N1831 Chronic kidney disease, stage 3a: Secondary | ICD-10-CM | POA: Diagnosis not present

## 2023-03-08 DIAGNOSIS — I700 Atherosclerosis of aorta: Secondary | ICD-10-CM | POA: Diagnosis not present

## 2023-03-08 DIAGNOSIS — G894 Chronic pain syndrome: Secondary | ICD-10-CM | POA: Diagnosis not present

## 2023-03-09 DIAGNOSIS — H353221 Exudative age-related macular degeneration, left eye, with active choroidal neovascularization: Secondary | ICD-10-CM | POA: Diagnosis not present

## 2023-03-28 ENCOUNTER — Other Ambulatory Visit: Payer: Self-pay | Admitting: Pulmonary Disease

## 2023-03-28 ENCOUNTER — Other Ambulatory Visit: Payer: Self-pay | Admitting: Vascular Surgery

## 2023-03-31 ENCOUNTER — Other Ambulatory Visit: Payer: Self-pay | Admitting: Pulmonary Disease

## 2023-04-10 NOTE — Progress Notes (Unsigned)
Jessica [name], female    DOB: Mar 12, 1932   MRN: [id number]   Brief patient profile:  89  yowf quit smoking in her 30-40s self referred to pulmonary clinic 04/12/2023 GOLD 2 copd on high doses of inderal for heart rate with episode 04/08/23 of sob p dust exp no better p neb saba.  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  Next day, SOB and chest congestion x 5 days.  Dyspnea:  acute onset May 9th p exp to dust and no better with neb saba (new supply) but  gradually dissipated over next 5 day and back to bas[place] doe by day of ov = dragging trash up from street is only activity she can identify assoc with doe  Cough: p stirring usually has congested cough in am x 30 min x years slt grey  - has never tried trelegy first thing in am or saba for this problem previously and Dr Francine Graven ordered flutter but did not receive one  as of 04/12/2023  Sleep: bed is flat one pillow SABA use: none   No obvious day to day or daytime pattern/variability or frankly  purulent sputum or mucus plugs or hemoptysis or cp or chest tightness, subjective wheeze or overt sinus or hb symptoms.   Most nights sleeping well  without nocturnal  or early am exacerbation  of respiratory  c/o's or need for noct saba. Also denies any obvious fluctuation of symptoms with weather or environmental changes or other aggravating or alleviating factors except as outlined above   No unusual  exposure hx or h/o childhood pna/ asthma or knowledge of premature birth.  Current Allergies, Complete Past Medical History, Past Surgical History, Family History, and Social History were reviewed in [employer] record.  ROS  The following are not active complaints unless bolded Hoarseness, sore throat, dysphagia, dental problems, itching, sneezing,  nasal congestion or discharge of excess mucus or purulent secretions, ear ache,   fever, chills, sweats, unintended wt loss or wt gain, classically pleuritic or exertional cp,  orthopnea pnd or arm/hand swelling  or leg swelling, presyncope, palpitations, abdominal pain, anorexia, nausea, vomiting, diarrhea  or change in bowel habits or change in bladder habits, change in stools or change in urine, dysuria, hematuria,  rash, arthralgias, visual complaints, headache, numbness, weakness or ataxia or problems with walking or coordination,  change in mood or  memory.           Past Medical History:  Diagnosis Date   Anemia    Arthritis    "thumbs" (06/27/2015)   Bilateral renal artery stenosis (HCC)    Cataract    Chronic bronchitis (HCC)    Chronic lower back pain    COPD (chronic obstructive pulmonary disease) (HCC)    GERD (gastroesophageal reflux disease)    Headache    "probably weekly" (06/27/2015)   Hypertension  Hypertriglyceridemia    Migraine    "maybe monthly" (06/27/2015)   Pneumonia     Outpatient Medications Prior to Visit  Medication Sig Dispense Refill   albuterol (PROVENTIL) (2.5 MG/3ML) 0.083% nebulizer solution INHALE 3 ML BY NEBULIZATION EVERY 6 HOURS AS NEEDED FOR WHEEZING OR SHORTNESS OF BREATH 75 mL 12   albuterol (VENTOLIN HFA) 108 (90 Base) MCG/ACT inhaler Inhale 2 puffs into the lungs every 6 (six) hours as needed for wheezing or shortness of breath. 8 g 2   amLODipine (NORVASC) 5 MG tablet Take 5 mg by mouth daily.     aspirin EC 81 MG tablet 1 tablet Orally Once a day     atorvastatin  (LIPITOR) 20 MG tablet TAKE 1 TABLET BY MOUTH EVERY DAY 90 tablet 3   clopidogrel (PLAVIX) 75 MG tablet Take 1 tablet by mouth daily.     estradiol (ESTRACE VAGINAL) 0.1 MG/GM vaginal cream Place 1 g vaginally 2 (two) times a week. 42.5 g 3   Fluticasone-Umeclidin-Vilant (TRELEGY ELLIPTA) 200-62.5-25 MCG/ACT AEPB Inhale 1 puff into the lungs daily. 2 each 0   gabapentin (NEURONTIN) 100 MG capsule Take 100 mg by mouth at bedtime.     HYDROcodone-acetaminophen (NORCO) 10-325 MG tablet Take 1 tablet by mouth 3 (three) times daily as needed (pain).     ipratropium (ATROVENT) 0.03 % nasal spray PLACE 2 SPRAYS INTO BOTH NOSTRILS 2 (TWO) TIMES DAILY AS NEEDED FOR RHINITIS. 90 mL 1   losartan (COZAAR) 100 MG tablet Take 100 mg by mouth every evening.     mirtazapine (REMERON) 15 MG tablet Take 15 mg by mouth at bedtime.     montelukast (SINGULAIR) 10 MG tablet Take 10 mg by mouth at bedtime.   6   Multiple Vitamins-Minerals (MULTIVITAMIN PO) Take 1 tablet by mouth daily.      Multiple Vitamins-Minerals (PRESERVISION AREDS 2) CAPS Take 1 capsule by mouth 2 (two) times daily.     omeprazole (PRILOSEC) 40 MG capsule omeprazole 40 mg capsule,delayed release   40 mg twice a day by oral route.     propranolol ER (INDERAL LA) 120 MG 24 hr capsule Take 120 mg by mouth daily.  3   TRELEGY ELLIPTA 200-62.5-25 MCG/ACT AEPB TAKE 1 PUFF BY MOUTH EVERY DAY (Patient not taking: Reported on 04/12/2023) 60 each 3   TRELEGY ELLIPTA 200-62.5-25 MCG/ACT AEPB TAKE 1 PUFF BY MOUTH EVERY DAY (Patient not taking: Reported on 04/12/2023) 60 each 3   No facility-administered medications prior to visit.     Objective:     BP 98/60 (BP Location: Left Arm, Patient Position: Sitting, Cuff Size: Normal)   Pulse 63   Temp 97.7 F (36.5 C) (Oral)   Ht 5\' 2"  (1.575 m)   Wt 79 lb 9.6 oz (36.1 kg)   LMP 11/30/1976   SpO2 94%   BMI 14.56 kg/m   SpO2: 94 %  Amb pleasantly anxious wf nad   HEENT : Oropharynx  clear   Nasal  turbinates nl    NECK :  without  apparent JVD/ palpable Nodes/TM    LUNGS: no acc muscle use,  Min barrel  contour chest wall with bilateral  slightly decreased bs s audible wheeze and  without cough on insp or exp maneuvers and min  Hyperresonant  to  percussion bilaterally    CV:  RRR  no s3 or murmur or increase in P2, and no edema   ABD:  soft and nontender with pos end  insp Hoover's  in the supine position.  No bruits or organomegaly appreciated   MS:  Nl gait/ ext warm without deformities Or obvious joint restrictions  calf tenderness, cyanosis or clubbing     SKIN: warm and dry without lesions    NEURO:  alert, approp, nl sensorium with  no motor or cerebellar deficits apparent.              Assessment   COPD  GOLD 2/ AB Quit smoking in her 30s/40s -  Spirometry 04/12/2023  FEV1 1.15 (79%)  Ratio 0.61 with 10% response to saba  -  Episode severe sob at rest 04/08/23 no better p saba neb while on high dose Inderal for HR control   At baseline barely meets criteria for copd at all but does have daily AB type coughing p stirring in am so rec  1) try trelegy 200 first thing in am to see if helps (may actually be worse due to DPI effects on upper airway in which case sba/bud either as neb or hfa maybe could be considered but will defer to Dr Francine Graven  2) consider change inderol to bisoprol if asthma recurs and is refractory to saba (vs alternative dx) as this would be a very  unusual situation (no asthma to refractory asthma with a single exposure) - see sep a/p   3) flutter valve training today/ supplied new device  F/u as planned with Dr Francine Graven and [place].     Essential hypertension, benign In the setting of respiratory symptoms of unknown etiology,  It would be ideal  to use bystolic, the most beta -1  selective Beta blocker available in sample form, with bisoprolol the most selective generic choice  on the market, at least on a trial basis, to make sure the spillover  Beta 2 effects of the less specific Beta blockers are not contributing to this patient's symptoms.   The problem here is she's been on inderal a long time and very anxious baseline - if anxiety is being attenuated some by inderal's cns effects then may be a rough transition to bisoprolol so I'll leave that to Dr Evlyn Kanner to sort out and weigh benefits/ risks of making this change with her on her next ov.         Each maintenance medication was reviewed in detail including emphasizing most importantly the difference between maintenance and prns and under what circumstances the prns are to be triggered using an action plan format where appropriate.  Total time for H and P, chart review, counseling, reviewing hfa/hfa/neb device(s) and generating customized AVS unique to this office visit / same day charting = > 40 min for respiratory  symptoms of uncertain etiology with acute pt new to me.           Sandrea Hughs, MD 04/12/2023

## 2023-04-12 ENCOUNTER — Ambulatory Visit (INDEPENDENT_AMBULATORY_CARE_PROVIDER_SITE_OTHER): Payer: Medicare Other | Admitting: Internal Medicine

## 2023-04-12 ENCOUNTER — Encounter: Payer: Self-pay | Admitting: Internal Medicine

## 2023-04-12 VITALS — BP 98/60 | HR 63 | Temp 97.7°F | Ht 62.0 in | Wt 79.6 lb

## 2023-04-12 DIAGNOSIS — J449 Chronic obstructive pulmonary disease, unspecified: Secondary | ICD-10-CM | POA: Diagnosis not present

## 2023-04-12 DIAGNOSIS — L821 Other seborrheic keratosis: Secondary | ICD-10-CM | POA: Diagnosis not present

## 2023-04-12 DIAGNOSIS — J439 Emphysema, unspecified: Secondary | ICD-10-CM

## 2023-04-12 DIAGNOSIS — I10 Essential (primary) hypertension: Secondary | ICD-10-CM | POA: Diagnosis not present

## 2023-04-12 DIAGNOSIS — L570 Actinic keratosis: Secondary | ICD-10-CM | POA: Diagnosis not present

## 2023-04-12 DIAGNOSIS — Z85828 Personal history of other malignant neoplasm of skin: Secondary | ICD-10-CM | POA: Diagnosis not present

## 2023-04-12 DIAGNOSIS — L814 Other melanin hyperpigmentation: Secondary | ICD-10-CM | POA: Diagnosis not present

## 2023-04-12 DIAGNOSIS — D485 Neoplasm of uncertain behavior of skin: Secondary | ICD-10-CM | POA: Diagnosis not present

## 2023-04-12 DIAGNOSIS — D2262 Melanocytic nevi of left upper limb, including shoulder: Secondary | ICD-10-CM | POA: Diagnosis not present

## 2023-04-12 DIAGNOSIS — D0472 Carcinoma in situ of skin of left lower limb, including hip: Secondary | ICD-10-CM | POA: Diagnosis not present

## 2023-04-12 DIAGNOSIS — L820 Inflamed seborrheic keratosis: Secondary | ICD-10-CM | POA: Diagnosis not present

## 2023-04-12 DIAGNOSIS — I788 Other diseases of capillaries: Secondary | ICD-10-CM | POA: Diagnosis not present

## 2023-04-12 DIAGNOSIS — D1801 Hemangioma of skin and subcutaneous tissue: Secondary | ICD-10-CM | POA: Diagnosis not present

## 2023-04-12 DIAGNOSIS — D0471 Carcinoma in situ of skin of right lower limb, including hip: Secondary | ICD-10-CM | POA: Diagnosis not present

## 2023-04-12 NOTE — Assessment & Plan Note (Signed)
In the setting of respiratory symptoms of unknown etiology,  It would be ideal  to use bystolic, the most beta -1  selective Beta blocker available in sample form, with bisoprolol the most selective generic choice  on the market, at least on a trial basis, to make sure the spillover Beta 2 effects of the less specific Beta blockers are not contributing to this patient's symptoms.   The problem here is she's been on inderal a long time and very anxious baseline - if anxiety is being attenuated some by inderal's cns effects then may be a rough transition to bisoprolol so I'll leave that to Dr Evlyn Kanner to sort out and weigh benefits/ risks of making this change with her on her next ov.         Each maintenance medication was reviewed in detail including emphasizing most importantly the difference between maintenance and prns and under what circumstances the prns are to be triggered using an action plan format where appropriate.  Total time for H and P, chart review, counseling, reviewing hfa/hfa/neb device(s) and generating customized AVS unique to this office visit / same day charting = > 40 min for respiratory  symptoms of uncertain etiology with acute pt new to me.

## 2023-04-12 NOTE — Assessment & Plan Note (Signed)
Quit smoking in her 30s/40s -  Spirometry 04/12/2023  FEV1 1.15 (79%)  Ratio 0.61 with 10% response to saba  -  Episode severe sob at rest 04/08/23 no better p saba neb while on high dose Inderal for HR control   At baseline barely meets criteria for copd at all but does have daily AB type coughing p stirring in am so rec  1) try trelegy 200 first thing in am to see if helps (may actually be worse due to DPI effects on upper airway in which case sba/bud either as neb or hfa maybe could be considered but will defer to Dr Francine Graven  2) consider change inderol to bisoprol if asthma recurs and is refractory to saba (vs alternative dx) as this would be a very  unusual situation (no asthma to refractory asthma with a single exposure) - see sep a/p   3) flutter valve training today/ supplied new device  F/u as planned with Dr Francine Graven and [place].

## 2023-04-12 NOTE — Patient Instructions (Addendum)
You may need to change your inderal to bisoprolol if you continue to have breathing episodes that don't respond to albuterol 2.5 mg per nebulizer   Avoid dust exposure   Follow up with [place] as planned

## 2023-04-16 DIAGNOSIS — J432 Centrilobular emphysema: Secondary | ICD-10-CM | POA: Diagnosis not present

## 2023-04-16 DIAGNOSIS — R0602 Shortness of breath: Secondary | ICD-10-CM | POA: Diagnosis not present

## 2023-04-16 DIAGNOSIS — J42 Unspecified chronic bronchitis: Secondary | ICD-10-CM | POA: Diagnosis not present

## 2023-04-16 DIAGNOSIS — J441 Chronic obstructive pulmonary disease with (acute) exacerbation: Secondary | ICD-10-CM | POA: Diagnosis not present

## 2023-04-16 DIAGNOSIS — I129 Hypertensive chronic kidney disease with stage 1 through stage 4 chronic kidney disease, or unspecified chronic kidney disease: Secondary | ICD-10-CM | POA: Diagnosis not present

## 2023-04-20 DIAGNOSIS — Z961 Presence of intraocular lens: Secondary | ICD-10-CM | POA: Diagnosis not present

## 2023-04-20 DIAGNOSIS — H18413 Arcus senilis, bilateral: Secondary | ICD-10-CM | POA: Diagnosis not present

## 2023-04-20 DIAGNOSIS — H04123 Dry eye syndrome of bilateral lacrimal glands: Secondary | ICD-10-CM | POA: Diagnosis not present

## 2023-04-20 DIAGNOSIS — H353131 Nonexudative age-related macular degeneration, bilateral, early dry stage: Secondary | ICD-10-CM | POA: Diagnosis not present

## 2023-05-17 NOTE — Progress Notes (Unsigned)
Patient name: Jessica [name] MRN: [id number] DOB: [birth year] Sex: female  REASON FOR VISIT: 3 month follow-up, mesenteric artery disease  HPI: Jessica [name] is a 89 y.o. female well-known to vascular surgery that presents for 3 month interval follow-up for surveillance of her mesenteric artery disease.    She initially underwent angioplasty and stent of her celiac artery on 05/15/2020 for mesenteric ischemia with chronic  gastritis.  There was also question of an SMA stenosis that was widely patent on angiogram and no intervention was performed on the SMA.  She had complication of closure device in the right groin requiring common femoral endarterectomy.  She then underwent celiac stent angioplasty on 05/15/2021 for an in-stent stenosis.  We used a Celt closure device in the left common femoral artery and ultimately she had a complication of the closure device and developed a left common femoral occlusion.  She subsequently underwent a left common femoral endarterectomy with bovine patch with findings of a dissection on 05/20/2021.    Today she states she is having more weight loss.  She blames this on no appetite.  At times she has intermittent abdominal pain usually late in the day.  She states post-prandial abdominal pain is not driving her loss of appetite.    Past Medical History:  Diagnosis Date   Anemia    Arthritis    "thumbs" (06/27/2015)   Bilateral renal artery stenosis (HCC)    Cataract    Chronic bronchitis (HCC)    Chronic lower back pain    COPD (chronic obstructive pulmonary disease) (HCC)    GERD (gastroesophageal reflux disease)    Headache    "probably weekly" (06/27/2015)   Hypertension    Hypertriglyceridemia    Migraine    "maybe monthly" (06/27/2015)   Pneumonia     Past Surgical History:  Procedure Laterality Date   BALLOON DILATION  1980's?   "for renal stenosis"   CATARACT EXTRACTION, BILATERAL     ENDARTERECTOMY FEMORAL Right 05/15/2020   Procedure: Right  Common Femoral Endarterectomy;  Surgeon: Cephus Shelling, MD;  Location[facility] OR;  Service: Vascular;  Laterality: Right;   FEMORAL ARTERY EXPLORATION Left 05/20/2021   Procedure: LEFT LEG THROMBECTOMY WITH LEFT FEMORAL  ENDARTERECTOMY AND PATCH ANGIOPLASTY;  Surgeon: Larina Earthly, MD;  Location: [facility] OR;  Service: Vascular;  Laterality: Left;   PATCH ANGIOPLASTY Right 05/15/2020   Procedure: Common Femoral Artery Patch Angioplasty using XenoSure Bovine Pericardial Patch;  Surgeon: Cephus Shelling, MD;  Location: [facility] OR;  Service: Vascular;  Laterality: Right;   PERIPHERAL VASCULAR BALLOON ANGIOPLASTY  05/15/2021   Procedure: PERIPHERAL VASCULAR BALLOON ANGIOPLASTY;  Surgeon: Cephus Shelling, MD;  Location: [facility] INVASIVE CV LAB;  Service: Cardiovascular;;   Celiac   PERIPHERAL VASCULAR INTERVENTION  05/15/2020   Procedure: PERIPHERAL VASCULAR INTERVENTION;  Surgeon: Cephus Shelling, MD;  Location: [facility] INVASIVE CV LAB;  Service: Cardiovascular;;  Celiac   RECONSTRUCTION OF EYELID Bilateral 01/2016   Upper and lower eyelid   RENAL ANGIOPLASTY     TOE SURGERY Bilateral    "straightened big toe"   TONSILLECTOMY  ~ 1941   VAGINAL HYSTERECTOMY  1973   VISCERAL ANGIOGRAPHY N/A 05/15/2020   Procedure: MESENTERIC  ANGIOGRAPHY;  Surgeon: Cephus Shelling, MD;  Location: [facility] INVASIVE CV LAB;  Service: Cardiovascular;  Laterality: N/A;   VISCERAL ANGIOGRAPHY N/A 05/15/2021   Procedure: MESENTRIC ANGIOGRAPHY;  Surgeon: Cephus Shelling, MD;  Location: [facility] INVASIVE CV LAB;  Service: Cardiovascular;  Laterality: N/A;    Family History  Problem Relation Age of Onset   Parkinsonism Father    Cancer Brother        Bladder   Hypertension Brother    Hyperlipidemia Brother    Stroke Maternal Grandfather    Colon cancer Neg Hx    Colon polyps Neg Hx    Stomach cancer Neg Hx     SOCIAL HISTORY: Social History   Tobacco Use   Smoking status: Former    Packs/day: 0.50    Years: 20.00     Additional pack years: 0.00    Total pack years: 10.00    Types: Cigarettes   Smokeless tobacco: Never   Tobacco comments:    'quit smoking in the 1990's?"  Substance Use Topics   Alcohol use: Yes    Alcohol/week: 0.0 - 1.0 standard drinks of alcohol    Comment: rarely    No Known Allergies  Current Outpatient Medications  Medication Sig Dispense Refill   albuterol (PROVENTIL) (2.5 MG/3ML) 0.083% nebulizer solution INHALE 3 ML BY NEBULIZATION EVERY 6 HOURS AS NEEDED FOR WHEEZING OR SHORTNESS OF BREATH 75 mL 12   albuterol (VENTOLIN HFA) 108 (90 Base) MCG/ACT inhaler Inhale 2 puffs into the lungs every 6 (six) hours as needed for wheezing or shortness of breath. 8 g 2   amLODipine (NORVASC) 5 MG tablet Take 5 mg by mouth daily.     aspirin EC 81 MG tablet 1 tablet Orally Once a day     atorvastatin (LIPITOR) 20 MG tablet TAKE 1 TABLET BY MOUTH EVERY DAY 90 tablet 3   clopidogrel (PLAVIX) 75 MG tablet Take 1 tablet by mouth daily.     estradiol (ESTRACE VAGINAL) 0.1 MG/GM vaginal cream Place 1 g vaginally 2 (two) times a week. 42.5 g 3   Fluticasone-Umeclidin-Vilant (TRELEGY ELLIPTA) 200-62.5-25 MCG/ACT AEPB Inhale 1 puff into the lungs daily. 2 each 0   gabapentin (NEURONTIN) 100 MG capsule Take 100 mg by mouth at bedtime.     HYDROcodone-acetaminophen (NORCO) 10-325 MG tablet Take 1 tablet by mouth 3 (three) times daily as needed (pain).     ipratropium (ATROVENT) 0.03 % nasal spray PLACE 2 SPRAYS INTO BOTH NOSTRILS 2 (TWO) TIMES DAILY AS NEEDED FOR RHINITIS. 90 mL 1   losartan (COZAAR) 100 MG tablet Take 100 mg by mouth every evening.     mirtazapine (REMERON) 15 MG tablet Take 15 mg by mouth at bedtime.     montelukast (SINGULAIR) 10 MG tablet Take 10 mg by mouth at bedtime.   6   Multiple Vitamins-Minerals (MULTIVITAMIN PO) Take 1 tablet by mouth daily.      Multiple Vitamins-Minerals (PRESERVISION AREDS 2) CAPS Take 1 capsule by mouth 2 (two) times daily.     omeprazole (PRILOSEC)  40 MG capsule omeprazole 40 mg capsule,delayed release   40 mg twice a day by oral route.     propranolol ER (INDERAL LA) 120 MG 24 hr capsule Take 120 mg by mouth daily.  3   No current facility-administered medications for this visit.    REVIEW OF SYSTEMS:  [X]  denotes positive finding, [ ]  denotes negative finding Cardiac  Comments:  Chest pain or chest pressure:    Shortness of breath upon exertion:    Short of breath when lying flat:    Irregular heart rhythm:        Vascular    Pain in calf, thigh, or hip brought on by ambulation:  Pain in feet at night that wakes you up from your sleep:     Blood clot in your veins:    Leg swelling:         Pulmonary    Oxygen at home:    Productive cough:     Wheezing:         Neurologic    Sudden weakness in arms or legs:     Sudden numbness in arms or legs:     Sudden onset of difficulty speaking or slurred speech:    Temporary loss of vision in one eye:     Problems with dizziness:         Gastrointestinal    Blood in stool:     Vomited blood:         Genitourinary    Burning when urinating:     Blood in urine:        Psychiatric    Major depression:         Hematologic    Bleeding problems:    Problems with blood clotting too easily:        Skin    Rashes or ulcers:        Constitutional    Fever or chills:      PHYSICAL EXAM: There were no vitals filed for this visit.    GENERAL: The patient is a well-nourished female, in no acute distress. The vital signs are documented above. CARDIAC: There is a regular rate and rhythm.  VASCULAR:  Bilateral femoral pulses palpable No palpable pedal pulses, no active tissue loss   DATA:   Mesenteric duplex today shows high-grade stenosis in celiac stent with velocity 567/166 and high-grade stenosis in SMA with velocity 554/72  Assessment/Plan:  89 year old female presents for 3 month interval follow-up for surveillance of her mesenteric artery disease.  I  discussed that her mesenteric duplex today shows evidence of high-grade stenosis in the celiac stent as well as a high-grade stenosis in the SMA.  We have previously been pursuing surveillance given she had no associated symptoms to warrant further intervention.     Cephus Shelling, MD [facility] Office: [phone]

## 2023-05-18 ENCOUNTER — Encounter: Payer: Self-pay | Admitting: Vascular Surgery

## 2023-05-18 ENCOUNTER — Ambulatory Visit (INDEPENDENT_AMBULATORY_CARE_PROVIDER_SITE_OTHER): Payer: Medicare Other | Admitting: Vascular Surgery

## 2023-05-18 VITALS — BP 129/68 | HR 67 | Temp 97.8°F | Resp 16

## 2023-05-18 DIAGNOSIS — K551 Chronic vascular disorders of intestine: Secondary | ICD-10-CM | POA: Diagnosis not present

## 2023-05-29 ENCOUNTER — Other Ambulatory Visit: Payer: Self-pay

## 2023-05-29 DIAGNOSIS — K551 Chronic vascular disorders of intestine: Secondary | ICD-10-CM

## 2023-06-08 DIAGNOSIS — H353112 Nonexudative age-related macular degeneration, right eye, intermediate dry stage: Secondary | ICD-10-CM | POA: Diagnosis not present

## 2023-06-08 DIAGNOSIS — H35033 Hypertensive retinopathy, bilateral: Secondary | ICD-10-CM | POA: Diagnosis not present

## 2023-06-08 DIAGNOSIS — H43813 Vitreous degeneration, bilateral: Secondary | ICD-10-CM | POA: Diagnosis not present

## 2023-06-08 DIAGNOSIS — Z961 Presence of intraocular lens: Secondary | ICD-10-CM | POA: Diagnosis not present

## 2023-06-08 DIAGNOSIS — H353221 Exudative age-related macular degeneration, left eye, with active choroidal neovascularization: Secondary | ICD-10-CM | POA: Diagnosis not present

## 2023-06-11 ENCOUNTER — Other Ambulatory Visit: Payer: Self-pay | Admitting: Primary Care

## 2023-06-28 DIAGNOSIS — G8929 Other chronic pain: Secondary | ICD-10-CM | POA: Diagnosis not present

## 2023-07-01 DIAGNOSIS — D485 Neoplasm of uncertain behavior of skin: Secondary | ICD-10-CM | POA: Diagnosis not present

## 2023-07-01 DIAGNOSIS — H0279 Other degenerative disorders of eyelid and periocular area: Secondary | ICD-10-CM | POA: Diagnosis not present

## 2023-07-01 DIAGNOSIS — H02885 Meibomian gland dysfunction left lower eyelid: Secondary | ICD-10-CM | POA: Diagnosis not present

## 2023-07-01 DIAGNOSIS — Z01818 Encounter for other preprocedural examination: Secondary | ICD-10-CM | POA: Diagnosis not present

## 2023-07-01 DIAGNOSIS — H0234 Blepharochalasis left upper eyelid: Secondary | ICD-10-CM | POA: Diagnosis not present

## 2023-07-01 DIAGNOSIS — H0231 Blepharochalasis right upper eyelid: Secondary | ICD-10-CM | POA: Diagnosis not present

## 2023-07-15 DIAGNOSIS — M5416 Radiculopathy, lumbar region: Secondary | ICD-10-CM | POA: Diagnosis not present

## 2023-07-15 DIAGNOSIS — I129 Hypertensive chronic kidney disease with stage 1 through stage 4 chronic kidney disease, or unspecified chronic kidney disease: Secondary | ICD-10-CM | POA: Diagnosis not present

## 2023-07-15 DIAGNOSIS — R7301 Impaired fasting glucose: Secondary | ICD-10-CM | POA: Diagnosis not present

## 2023-07-15 DIAGNOSIS — N1831 Chronic kidney disease, stage 3a: Secondary | ICD-10-CM | POA: Diagnosis not present

## 2023-07-15 DIAGNOSIS — J432 Centrilobular emphysema: Secondary | ICD-10-CM | POA: Diagnosis not present

## 2023-07-15 DIAGNOSIS — E785 Hyperlipidemia, unspecified: Secondary | ICD-10-CM | POA: Diagnosis not present

## 2023-07-15 DIAGNOSIS — F331 Major depressive disorder, recurrent, moderate: Secondary | ICD-10-CM | POA: Diagnosis not present

## 2023-07-15 DIAGNOSIS — G894 Chronic pain syndrome: Secondary | ICD-10-CM | POA: Diagnosis not present

## 2023-07-15 DIAGNOSIS — I774 Celiac artery compression syndrome: Secondary | ICD-10-CM | POA: Diagnosis not present

## 2023-07-15 DIAGNOSIS — Z95820 Peripheral vascular angioplasty status with implants and grafts: Secondary | ICD-10-CM | POA: Diagnosis not present

## 2023-07-15 DIAGNOSIS — K589 Irritable bowel syndrome without diarrhea: Secondary | ICD-10-CM | POA: Diagnosis not present

## 2023-07-15 DIAGNOSIS — I700 Atherosclerosis of aorta: Secondary | ICD-10-CM | POA: Diagnosis not present

## 2023-07-19 DIAGNOSIS — Z85828 Personal history of other malignant neoplasm of skin: Secondary | ICD-10-CM | POA: Diagnosis not present

## 2023-07-19 DIAGNOSIS — D0472 Carcinoma in situ of skin of left lower limb, including hip: Secondary | ICD-10-CM | POA: Diagnosis not present

## 2023-07-19 DIAGNOSIS — D0471 Carcinoma in situ of skin of right lower limb, including hip: Secondary | ICD-10-CM | POA: Diagnosis not present

## 2023-08-03 DIAGNOSIS — H02885 Meibomian gland dysfunction left lower eyelid: Secondary | ICD-10-CM | POA: Diagnosis not present

## 2023-08-03 DIAGNOSIS — H0279 Other degenerative disorders of eyelid and periocular area: Secondary | ICD-10-CM | POA: Diagnosis not present

## 2023-08-03 DIAGNOSIS — D485 Neoplasm of uncertain behavior of skin: Secondary | ICD-10-CM | POA: Diagnosis not present

## 2023-08-03 DIAGNOSIS — H0234 Blepharochalasis left upper eyelid: Secondary | ICD-10-CM | POA: Diagnosis not present

## 2023-08-03 DIAGNOSIS — H0231 Blepharochalasis right upper eyelid: Secondary | ICD-10-CM | POA: Diagnosis not present

## 2023-08-03 DIAGNOSIS — Z01818 Encounter for other preprocedural examination: Secondary | ICD-10-CM | POA: Diagnosis not present

## 2023-08-03 DIAGNOSIS — H0015 Chalazion left lower eyelid: Secondary | ICD-10-CM | POA: Diagnosis not present

## 2023-09-10 ENCOUNTER — Telehealth: Payer: Self-pay | Admitting: Pulmonary Disease

## 2023-09-10 NOTE — Telephone Encounter (Signed)
PT states the meds she is on now are not really controlling her breathing. She has a appt in December w/Ms. Clent Ridges but wondered if we could advise her on what can be done before them. Some sort of medication adjustment.   Her number is [phone]  Trellegy Albuterol

## 2023-09-15 ENCOUNTER — Other Ambulatory Visit: Payer: Self-pay | Admitting: Acute Care

## 2023-09-15 DIAGNOSIS — J449 Chronic obstructive pulmonary disease, unspecified: Secondary | ICD-10-CM

## 2023-09-15 MED ORDER — PREDNISONE 10 MG PO TABS
ORAL_TABLET | ORAL | 0 refills | Status: DC
Start: 2023-09-15 — End: 2023-12-16

## 2023-09-15 MED ORDER — DOXYCYCLINE HYCLATE 100 MG PO TABS
100.0000 mg | ORAL_TABLET | Freq: Two times a day (BID) | ORAL | 0 refills | Status: AC
Start: 2023-09-15 — End: 2023-09-22

## 2023-09-15 NOTE — Telephone Encounter (Signed)
Bevelyn Ngo, NP  to Me  [facility] Triage Pool     09/15/23  2:44 PM  Sounds like she is having a COPD Flare. I will send in Doxycycline 100 mg twice daily x 7 days. Tell her to take a probiotic with antibiotic and for 2-3 days after she completes treatment. ( Activia Yogurt or any over the counter probiotic like Culturelle or Align.) I will send on a prednisone taper Prednisone taper; 10 mg tablets: 4 tabs x 2 days, 3 tabs x 2 days, 2 tabs x 2 days 1 tab x 2 days then stop. Take with breakfast. If she is diabetic she will need to watch her blood sugars carefully as prednisone will increase her Blood sugars. Have her continue her Trelegy and albuterol as prescribed. Flutter valve for clearance of mucus. If she gets worse not better, she needs to go to an urgent care or seek care in an Emergency room. Please see if there is any way to get her in sooner. ? She may benefit from [place] as it is twice daily dosing. Please call her and let her know the meds are being sent in.  Spoke with the pt and notified of response per Maralyn Sago  She verbalized understanding  Will keep appt as scheduled  Nothing sooner open

## 2023-09-15 NOTE — Telephone Encounter (Signed)
Spoke with the pt  She is c/o increased SOB over the past wk  She has increased congestion and cough with grey to yellow sputum  She denies any fevers, aches, wheezing  She is taking her trelegy and albuterol inhaler and neb with albuterol  No appts sooner than the one she is scheduled for in Dec 2024  Please advise, thanks!

## 2023-09-28 DIAGNOSIS — H353221 Exudative age-related macular degeneration, left eye, with active choroidal neovascularization: Secondary | ICD-10-CM | POA: Diagnosis not present

## 2023-09-30 DIAGNOSIS — J189 Pneumonia, unspecified organism: Secondary | ICD-10-CM | POA: Diagnosis not present

## 2023-09-30 DIAGNOSIS — K219 Gastro-esophageal reflux disease without esophagitis: Secondary | ICD-10-CM | POA: Diagnosis not present

## 2023-09-30 DIAGNOSIS — J432 Centrilobular emphysema: Secondary | ICD-10-CM | POA: Diagnosis not present

## 2023-09-30 DIAGNOSIS — J441 Chronic obstructive pulmonary disease with (acute) exacerbation: Secondary | ICD-10-CM | POA: Diagnosis not present

## 2023-09-30 DIAGNOSIS — J42 Unspecified chronic bronchitis: Secondary | ICD-10-CM | POA: Diagnosis not present

## 2023-10-15 ENCOUNTER — Other Ambulatory Visit: Payer: Self-pay | Admitting: Pulmonary Disease

## 2023-10-22 ENCOUNTER — Other Ambulatory Visit: Payer: Self-pay | Admitting: Primary Care

## 2023-11-02 ENCOUNTER — Ambulatory Visit: Payer: Medicare Other | Admitting: Vascular Surgery

## 2023-11-02 ENCOUNTER — Encounter (HOSPITAL_COMMUNITY): Payer: Medicare Other

## 2023-11-02 DIAGNOSIS — Z79891 Long term (current) use of opiate analgesic: Secondary | ICD-10-CM | POA: Diagnosis not present

## 2023-11-02 DIAGNOSIS — T402X5D Adverse effect of other opioids, subsequent encounter: Secondary | ICD-10-CM | POA: Diagnosis not present

## 2023-11-02 DIAGNOSIS — Z79899 Other long term (current) drug therapy: Secondary | ICD-10-CM | POA: Diagnosis not present

## 2023-11-02 DIAGNOSIS — I739 Peripheral vascular disease, unspecified: Secondary | ICD-10-CM | POA: Diagnosis not present

## 2023-11-02 DIAGNOSIS — M5459 Other low back pain: Secondary | ICD-10-CM | POA: Diagnosis not present

## 2023-11-02 DIAGNOSIS — M5416 Radiculopathy, lumbar region: Secondary | ICD-10-CM | POA: Diagnosis not present

## 2023-11-19 DIAGNOSIS — Z23 Encounter for immunization: Secondary | ICD-10-CM | POA: Diagnosis not present

## 2023-11-19 DIAGNOSIS — Z95820 Peripheral vascular angioplasty status with implants and grafts: Secondary | ICD-10-CM | POA: Diagnosis not present

## 2023-11-19 DIAGNOSIS — D649 Anemia, unspecified: Secondary | ICD-10-CM | POA: Diagnosis not present

## 2023-11-19 DIAGNOSIS — R7301 Impaired fasting glucose: Secondary | ICD-10-CM | POA: Diagnosis not present

## 2023-11-19 DIAGNOSIS — N1831 Chronic kidney disease, stage 3a: Secondary | ICD-10-CM | POA: Diagnosis not present

## 2023-11-19 DIAGNOSIS — E871 Hypo-osmolality and hyponatremia: Secondary | ICD-10-CM | POA: Diagnosis not present

## 2023-11-19 DIAGNOSIS — I129 Hypertensive chronic kidney disease with stage 1 through stage 4 chronic kidney disease, or unspecified chronic kidney disease: Secondary | ICD-10-CM | POA: Diagnosis not present

## 2023-11-19 DIAGNOSIS — J432 Centrilobular emphysema: Secondary | ICD-10-CM | POA: Diagnosis not present

## 2023-11-19 DIAGNOSIS — I774 Celiac artery compression syndrome: Secondary | ICD-10-CM | POA: Diagnosis not present

## 2023-11-19 DIAGNOSIS — R627 Adult failure to thrive: Secondary | ICD-10-CM | POA: Diagnosis not present

## 2023-11-19 DIAGNOSIS — I701 Atherosclerosis of renal artery: Secondary | ICD-10-CM | POA: Diagnosis not present

## 2023-11-19 DIAGNOSIS — E785 Hyperlipidemia, unspecified: Secondary | ICD-10-CM | POA: Diagnosis not present

## 2023-11-25 ENCOUNTER — Telehealth: Payer: Self-pay | Admitting: Pulmonary Disease

## 2023-11-25 DIAGNOSIS — J449 Chronic obstructive pulmonary disease, unspecified: Secondary | ICD-10-CM

## 2023-11-25 MED ORDER — AMOXICILLIN-POT CLAVULANATE 875-125 MG PO TABS
1.0000 | ORAL_TABLET | Freq: Two times a day (BID) | ORAL | 0 refills | Status: DC
Start: 2023-11-25 — End: 2023-12-16

## 2023-11-25 MED ORDER — AZITHROMYCIN 250 MG PO TABS
ORAL_TABLET | ORAL | 0 refills | Status: DC
Start: 2023-11-25 — End: 2023-12-16

## 2023-11-25 MED ORDER — PREDNISONE 10 MG PO TABS
ORAL_TABLET | ORAL | 0 refills | Status: AC
Start: 2023-11-25 — End: 2023-12-07

## 2023-11-25 NOTE — Telephone Encounter (Signed)
Called and spoke to patient.  She stated that she was dx with PNA 08/2023. Sx have lingering since, however over the past 2 weeks sx have worsened.  She reports of increased SOB, chest congestion prod cough with grey to yellow sputum and chills. No supplemental O2. Spo2 maintaining around 93%. She is using Trelegy once daily and albuterol solution once daily with no relief in sx.  No recent covid or flu test.   Dr. Francine Graven, please advise. Thanks

## 2023-11-25 NOTE — Telephone Encounter (Signed)
Augmentin twice daily for 7 days Zpak for 5 days Extended steroid taper   All rx sent to pharmacy. Hope she feels better. She is to call for a visit if not better.  Thanks, JD

## 2023-11-25 NOTE — Telephone Encounter (Signed)
Pt is aware of below message/recommendations. She will pick up Rx.  She stated that if her sx continue or worsen, she may end up at the ED. Nothing further needed.  Routing back to JD as an [place].

## 2023-11-25 NOTE — Telephone Encounter (Signed)
Patient is having shortness of breath and would like for something to be called in for it.   Pharmacy: [facility] [address]

## 2023-11-29 NOTE — Progress Notes (Signed)
 Patient name: Jessica [name] MRN: [id number] DOB: Jun 10, 1932 Sex: female  REASON FOR VISIT: 6 month follow-up, mesenteric artery disease  HPI: Jessica [name] is a 89 y.o. female well-known to vascular surgery that presents for 6 month interval follow-up for surveillance of her mesenteric artery disease.  She reports that she is hungry today.  No significant weight loss.  States she is maintaining her weight.  Looks forward to eating.  Worried about lower extremity swelling.  She initially underwent angioplasty and stent of her celiac artery on 05/15/2020 for mesenteric ischemia with chronic  gastritis.  There was also question of an SMA stenosis that was widely patent on angiogram and no intervention was performed on the SMA.  She had complication of closure device in the right groin requiring common femoral endarterectomy.  She then underwent celiac stent angioplasty on 05/15/2021 for an in-stent stenosis.  We used a Celt closure device in the left common femoral artery and ultimately she had a complication of the closure device and developed a left common femoral occlusion.  She subsequently underwent a left common femoral endarterectomy with bovine patch with findings of a dissection on 05/20/2021.    She was having some intermittent abdominal pain the last evaluation with concern for recurrent stenosis in the celiac stent.  I previously offered her angiogram again but she did not feel this was associated with oral intake and had no food fear.    Past Medical History:  Diagnosis Date   Anemia    Arthritis    thumbs (06/27/2015)   Bilateral renal artery stenosis (HCC)    Cataract    Chronic bronchitis (HCC)    Chronic lower back pain    COPD (chronic obstructive pulmonary disease) (HCC)    GERD (gastroesophageal reflux disease)    Headache    probably weekly (06/27/2015)   Hypertension    Hypertriglyceridemia    Migraine    maybe monthly (06/27/2015)   Pneumonia     Past Surgical  History:  Procedure Laterality Date   BALLOON DILATION  1980's?   for renal stenosis   CATARACT EXTRACTION, BILATERAL     ENDARTERECTOMY FEMORAL Right 05/15/2020   Procedure: Right Common Femoral Endarterectomy;  Surgeon: Gretta Lonni PARAS, MD;  Location: [facility] OR;  Service: Vascular;  Laterality: Right;   FEMORAL ARTERY EXPLORATION Left 05/20/2021   Procedure: LEFT LEG THROMBECTOMY WITH LEFT FEMORAL  ENDARTERECTOMY AND PATCH ANGIOPLASTY;  Surgeon: Oris Krystal FALCON, MD;  Location: [facility] OR;  Service: Vascular;  Laterality: Left;   PATCH ANGIOPLASTY Right 05/15/2020   Procedure: Common Femoral Artery Patch Angioplasty using XenoSure Bovine Pericardial Patch;  Surgeon: Gretta Lonni PARAS, MD;  Location: [facility] OR;  Service: Vascular;  Laterality: Right;   PERIPHERAL VASCULAR BALLOON ANGIOPLASTY  05/15/2021   Procedure: PERIPHERAL VASCULAR BALLOON ANGIOPLASTY;  Surgeon: Gretta Lonni PARAS, MD;  Location: [facility];  Service: Cardiovascular;;   Celiac   PERIPHERAL VASCULAR INTERVENTION  05/15/2020   Procedure: PERIPHERAL VASCULAR INTERVENTION;  Surgeon: Gretta Lonni PARAS, MD;  Location: [facility];  Service: Cardiovascular;;  Celiac   RECONSTRUCTION OF EYELID Bilateral 01/2016   Upper and lower eyelid   RENAL ANGIOPLASTY     TOE SURGERY Bilateral    straightened big toe   TONSILLECTOMY  ~ 1941   VAGINAL HYSTERECTOMY  1973   VISCERAL ANGIOGRAPHY N/A 05/15/2020   Procedure: MESENTERIC  ANGIOGRAPHY;  Surgeon: Gretta Lonni PARAS, MD;  Location: [facility];  Service: Cardiovascular;  Laterality: N/A;   VISCERAL ANGIOGRAPHY N/A 05/15/2021   Procedure: MESENTRIC ANGIOGRAPHY;  Surgeon: Gretta Lonni PARAS, MD;  Location: [facility];  Service: Cardiovascular;  Laterality: N/A;    Family History  Problem Relation Age of Onset   Parkinsonism Father    Cancer Brother        Bladder   Hypertension Brother    Hyperlipidemia Brother    Stroke Maternal Grandfather     Colon cancer Neg Hx    Colon polyps Neg Hx    Stomach cancer Neg Hx     SOCIAL HISTORY: Social History   Tobacco Use   Smoking status: Former    Current packs/day: 0.50    Average packs/day: 0.5 packs/day for 20.0 years (10.0 ttl pk-yrs)    Types: Cigarettes   Smokeless tobacco: Never   Tobacco comments:    'quit smoking in the 1990's?  Substance Use Topics   Alcohol  use: Yes    Alcohol /week: 0.0 - 1.0 standard drinks of alcohol     Comment: rarely    No Known Allergies  Current Outpatient Medications  Medication Sig Dispense Refill   albuterol  (PROVENTIL ) (2.5 MG/3ML) 0.083% nebulizer solution INHALE 3 ML BY NEBULIZATION EVERY 6 HOURS AS NEEDED FOR WHEEZING OR SHORTNESS OF BREATH 75 mL 12   albuterol  (VENTOLIN  HFA) 108 (90 Base) MCG/ACT inhaler TAKE 2 PUFFS BY MOUTH EVERY 6 HOURS AS NEEDED FOR WHEEZE OR SHORTNESS OF BREATH 153 g 2   amLODipine  (NORVASC ) 5 MG tablet Take 5 mg by mouth daily.     amoxicillin -clavulanate (AUGMENTIN ) 875-125 MG tablet Take 1 tablet by mouth 2 (two) times daily. 14 tablet 0   aspirin  EC 81 MG tablet 1 tablet Orally Once a day     atorvastatin  (LIPITOR) 20 MG tablet TAKE 1 TABLET BY MOUTH EVERY DAY 90 tablet 3   azithromycin  (ZITHROMAX ) 250 MG tablet Take as directed 6 tablet 0   clopidogrel  (PLAVIX ) 75 MG tablet Take 1 tablet by mouth daily.     estradiol  (ESTRACE  VAGINAL) 0.1 MG/GM vaginal cream Place 1 g vaginally 2 (two) times a week. 42.5 g 3   gabapentin  (NEURONTIN ) 100 MG capsule Take 100 mg by mouth at bedtime.     HYDROcodone -acetaminophen  (NORCO) 10-325 MG tablet Take 1 tablet by mouth 3 (three) times daily as needed (pain).     ipratropium (ATROVENT ) 0.03 % nasal spray PLACE 2 SPRAYS INTO BOTH NOSTRILS 2 (TWO) TIMES DAILY AS NEEDED FOR RHINITIS. 90 mL 1   losartan  (COZAAR ) 100 MG tablet Take 100 mg by mouth every evening.     mirtazapine  (REMERON ) 15 MG tablet Take 15 mg by mouth at bedtime.     montelukast  (SINGULAIR ) 10 MG tablet Take  10 mg by mouth at bedtime.   6   Multiple Vitamins-Minerals (MULTIVITAMIN PO) Take 1 tablet by mouth daily.      Multiple Vitamins-Minerals (PRESERVISION AREDS 2) CAPS Take 1 capsule by mouth 2 (two) times daily.     omeprazole  (PRILOSEC) 40 MG capsule omeprazole  40 mg capsule,delayed release   40 mg twice a day by oral route.     predniSONE  (DELTASONE ) 10 MG tablet Prednisone  taper; 10 mg tablets: 4 tabs x 2 days, 3 tabs x 2 days, 2 tabs x 2 days 1 tab x 2 days then stop. 20 tablet 0   predniSONE  (DELTASONE ) 10 MG tablet Take 4 tablets (40 mg total) by mouth daily with breakfast for 3 days, THEN 3 tablets (30 mg total)  daily with breakfast for 3 days, THEN 2 tablets (20 mg total) daily with breakfast for 3 days, THEN 1 tablet (10 mg total) daily with breakfast for 3 days. 30 tablet 0   propranolol  ER (INDERAL  LA) 120 MG 24 hr capsule Take 120 mg by mouth daily.  3   TRELEGY ELLIPTA  200-62.5-25 MCG/ACT AEPB INHALE 1 PUFF BY MOUTH EVERY DAY 60 each 3   No current facility-administered medications for this visit.    REVIEW OF SYSTEMS:  [X]  denotes positive finding, [ ]  denotes negative finding Cardiac  Comments:  Chest pain or chest pressure:    Shortness of breath upon exertion:    Short of breath when lying flat:    Irregular heart rhythm:        Vascular    Pain in calf, thigh, or hip brought on by ambulation:    Pain in feet at night that wakes you up from your sleep:     Blood clot in your veins:    Leg swelling:         Pulmonary    Oxygen  at home:    Productive cough:     Wheezing:         Neurologic    Sudden weakness in arms or legs:     Sudden numbness in arms or legs:     Sudden onset of difficulty speaking or slurred speech:    Temporary loss of vision in one eye:     Problems with dizziness:         Gastrointestinal    Blood in stool:     Vomited blood:         Genitourinary    Burning when urinating:     Blood in urine:        Psychiatric    Major  depression:         Hematologic    Bleeding problems:    Problems with blood clotting too easily:        Skin    Rashes or ulcers:        Constitutional    Fever or chills:      PHYSICAL EXAM: There were no vitals filed for this visit.    GENERAL: The patient is a well-nourished female, in no acute distress. The vital signs are documented above. CARDIAC: There is a regular rate and rhythm.  VASCULAR:  Bilateral femoral pulses palpable No palpable pedal pulses, no active tissue loss Bilateral DP signals monophasic   DATA:   Mesenteric duplex shows high grade celiac and SMA stenosis today with velocity 603/79 celiac and 360/31 SMA  (mesenteric duplex 02/16/23 shows high-grade stenosis in celiac stent with velocity 567/166 and high-grade stenosis in SMA with velocity 554/72)  Assessment/Plan:  89 year old female presents for 6 month interval follow-up for surveillance of her mesenteric artery disease.  I discussed that her mesenteric duplex again showed evidence of high-grade stenosis in the celiac stent as well as a high-grade stenosis in the SMA.  Previous angiogram did not demonstrate any significant SMA disease (at time of celiac intervention) Again denies any symptoms of chronic mesenteric ischemia.  States she is maintaining her weight and looks forward to eating.  Has no postprandial pain.  Will continue close interval surveillance.  I will see her again in 3 months.  If she has any recurrent symptoms would likely pursue CTA first prior to invasive intervention as she has had multiple complications from groin access in the past and again  SMA stenosis was not previously demonstrated.   Lonni DOROTHA Gaskins, MD [facility] Office: [phone]

## 2023-11-30 ENCOUNTER — Ambulatory Visit (INDEPENDENT_AMBULATORY_CARE_PROVIDER_SITE_OTHER): Payer: Medicare Other | Admitting: Vascular Surgery

## 2023-11-30 ENCOUNTER — Encounter: Payer: Self-pay | Admitting: Vascular Surgery

## 2023-11-30 ENCOUNTER — Ambulatory Visit (HOSPITAL_COMMUNITY)
Admission: RE | Admit: 2023-11-30 | Discharge: 2023-11-30 | Disposition: A | Discharge status: Home / Self Care | Payer: Medicare Other | Source: Ambulatory Visit | Attending: Vascular Surgery | Admitting: Vascular Surgery

## 2023-11-30 VITALS — BP 132/51 | HR 58 | Temp 98.0°F | Resp 18 | Ht 62.0 in

## 2023-11-30 DIAGNOSIS — K551 Chronic vascular disorders of intestine: Secondary | ICD-10-CM

## 2023-12-16 ENCOUNTER — Telehealth: Payer: Self-pay | Admitting: Pulmonary Disease

## 2023-12-16 DIAGNOSIS — J441 Chronic obstructive pulmonary disease with (acute) exacerbation: Secondary | ICD-10-CM

## 2023-12-16 MED ORDER — PREDNISONE 10 MG PO TABS
ORAL_TABLET | ORAL | 0 refills | Status: AC
Start: 2023-12-16 — End: 2023-12-28

## 2023-12-16 MED ORDER — DOXYCYCLINE HYCLATE 100 MG PO TABS
100.0000 mg | ORAL_TABLET | Freq: Two times a day (BID) | ORAL | 0 refills | Status: DC
Start: 2023-12-16 — End: 2024-01-09

## 2023-12-16 NOTE — Telephone Encounter (Signed)
I called and spoke with the pt. Pt was informed of the rx's that Dr Francine Graven sent in. Pt verbalized understanding. NFN.

## 2023-12-16 NOTE — Telephone Encounter (Signed)
Doxycycline 1 tab twice daily for 7 days and extended steroid taper sent to pharmacy for COPD exacerbation.  Thanks, JD

## 2023-12-16 NOTE — Telephone Encounter (Signed)
Patient is having a hard time breathing. Back in December she was on a breathing plan and wants to get back on it again. Please call and advise [phone]

## 2023-12-16 NOTE — Telephone Encounter (Signed)
I called and spoke with the pt. Pt states she needs some help with her breathing. It takes all of her energy to breathe. She has to breathe hard and is panting and this has not calmed down all day. Pt states she was on a breathing plan back in December, 2 antibiotics and prednisone and this helped. Pt states she is unsure of what she needs and is having a hard time breathing. I advised pt she needs to go to the ER. Pt is not on O2, and states her O2 was at 92% this morning. Pt states the antibiotics and prednisone were helping. Pt has COPD. Pt states she has a little bit of chest congestion, and the mucous is yellow.  Please advise.

## 2024-01-07 ENCOUNTER — Telehealth (HOSPITAL_BASED_OUTPATIENT_CLINIC_OR_DEPARTMENT_OTHER): Payer: Self-pay | Admitting: Pulmonary Disease

## 2024-01-07 ENCOUNTER — Telehealth: Payer: Self-pay | Admitting: Pulmonary Disease

## 2024-01-07 NOTE — Telephone Encounter (Signed)
 Ruthell Lauraine FALCON, NP  Claudene Lebron LABOR, CMA; P L[facility] Triage Pool Caller: Unspecified (Today, 10:14 AM) She has not been seen since 03/2023. She has had 3 prescriptions for antibiotic and prednisone  sent in since December. She needs to be seen with CXR by any MD or APP. She needs to do home test for flu and Covid. She soul go to an urgent care to be seen. Thanks   I called and spoke with the pt and notified of response from Lauraine  She verbalized understanding  Nothing further needed

## 2024-01-07 NOTE — Telephone Encounter (Signed)
See other phone note dated today 

## 2024-01-07 NOTE — Telephone Encounter (Signed)
 Called and spoke to patient. She is requesting prednisone .  C/o increased SOB with exertion, chest congestion and prod cough with yellow sputum x2d. Denied f/c/s or additional sx.  She is using trelegy once daily and albuterol  once daily No recent covid or flu test.  She does not wear supplemental oxygen . Spo2 is maintaining around 92%.  Sarah, please advise. Dr. Kara is unavailable.

## 2024-01-07 NOTE — Telephone Encounter (Signed)
 Patient states having symptom of shortness of breath. Patient would like Prednisone  called into pharmacy. Pharmacy is [facility]. Patient phone number is [phone].

## 2024-01-09 ENCOUNTER — Other Ambulatory Visit: Payer: Self-pay

## 2024-01-09 ENCOUNTER — Encounter: Payer: Self-pay | Admitting: *Deleted

## 2024-01-09 ENCOUNTER — Ambulatory Visit (INDEPENDENT_AMBULATORY_CARE_PROVIDER_SITE_OTHER): Payer: Medicare Other

## 2024-01-09 ENCOUNTER — Ambulatory Visit
Admission: EM | Admit: 2024-01-09 | Discharge: 2024-01-09 | Disposition: A | Discharge status: Home / Self Care | Payer: Medicare Other | Attending: Physician Assistant | Admitting: Physician Assistant

## 2024-01-09 DIAGNOSIS — J441 Chronic obstructive pulmonary disease with (acute) exacerbation: Secondary | ICD-10-CM

## 2024-01-09 DIAGNOSIS — J449 Chronic obstructive pulmonary disease, unspecified: Secondary | ICD-10-CM | POA: Diagnosis not present

## 2024-01-09 DIAGNOSIS — R059 Cough, unspecified: Secondary | ICD-10-CM | POA: Diagnosis not present

## 2024-01-09 DIAGNOSIS — R0902 Hypoxemia: Secondary | ICD-10-CM | POA: Diagnosis not present

## 2024-01-09 DIAGNOSIS — R0602 Shortness of breath: Secondary | ICD-10-CM | POA: Diagnosis not present

## 2024-01-09 DIAGNOSIS — J439 Emphysema, unspecified: Secondary | ICD-10-CM | POA: Diagnosis not present

## 2024-01-09 MED ORDER — PREDNISONE 10 MG PO TABS: ORAL_TABLET | ORAL | 0 refills | Status: DC

## 2024-01-09 MED ORDER — DOXYCYCLINE HYCLATE 100 MG PO TABS: 100.0000 mg | ORAL_TABLET | Freq: Two times a day (BID) | ORAL | 0 refills | Status: DC

## 2024-01-09 NOTE — Discharge Instructions (Signed)
See your Physician for recheck this week.   °

## 2024-01-09 NOTE — ED Triage Notes (Signed)
 Pt reports she has been SOB since Thursday. She contacted her pulmonologist on Friday but states they said they couldn't see me and I should go to Urgent care. Reports she had similar episode in January and was treated with antibiotics and prednisone  with some relief. States her O2 levels at home have been 93%. States she felt okay this morning until she got up and started moving around. Reports she is coughing up yellow secretions.
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
## 2024-01-20 ENCOUNTER — Ambulatory Visit: Payer: Medicare Other | Admitting: Pulmonary Disease

## 2024-01-24 DIAGNOSIS — Z961 Presence of intraocular lens: Secondary | ICD-10-CM | POA: Diagnosis not present

## 2024-01-24 DIAGNOSIS — H353221 Exudative age-related macular degeneration, left eye, with active choroidal neovascularization: Secondary | ICD-10-CM | POA: Diagnosis not present

## 2024-01-24 DIAGNOSIS — H353112 Nonexudative age-related macular degeneration, right eye, intermediate dry stage: Secondary | ICD-10-CM | POA: Diagnosis not present

## 2024-01-24 DIAGNOSIS — H35033 Hypertensive retinopathy, bilateral: Secondary | ICD-10-CM | POA: Diagnosis not present

## 2024-01-24 DIAGNOSIS — H43813 Vitreous degeneration, bilateral: Secondary | ICD-10-CM | POA: Diagnosis not present

## 2024-01-26 ENCOUNTER — Ambulatory Visit (INDEPENDENT_AMBULATORY_CARE_PROVIDER_SITE_OTHER): Payer: Medicare Other | Admitting: Primary Care

## 2024-01-26 ENCOUNTER — Encounter: Payer: Self-pay | Admitting: Primary Care

## 2024-01-26 VITALS — BP 160/68 | HR 63 | Temp 96.6°F | Ht 62.0 in

## 2024-01-26 DIAGNOSIS — J449 Chronic obstructive pulmonary disease, unspecified: Secondary | ICD-10-CM

## 2024-01-26 MED ORDER — PREDNISONE 5 MG PO TABS: ORAL_TABLET | ORAL | 0 refills | Status: DC

## 2024-01-26 MED ORDER — NYSTATIN 100000 UNIT/ML MT SUSP: 5.0000 mL | Freq: Four times a day (QID) | OROMUCOSAL | 0 refills | Status: DC

## 2024-01-26 NOTE — Progress Notes (Signed)
 @Patient  ID: Jessica [name], female    DOB: [birth year], 89 y.o.   MRN: [id number]  No chief complaint on file.   Referring provider: Adrian Prince, MD  HPI: 89 year old female, former smoker (10-pack-year history).  Past medical history significant for hypertension, TIA, renal artery stenosis, COPD Gold 2, vocal cord atrophy, GERD, postnasal drip, dysphagia, dyslipidemia, chronic headaches, protein calorie malnutrition.  Patient of Dr. Francine Graven, she was last seen in May 2024 by Dr. Sherene Sires for an acute visit.  01/26/2024-  Discussed the use of AI scribe software for clinical note transcription with the patient, who gave verbal consent to proceed.  History of Present Illness   Jessica [name] is an 89 year old female with emphysema and mild COPD who presents with exacerbations of shortness of breath and cough.  She has been experiencing exacerbations of shortness of breath and cough for several years. Over the past six months, she has had 'breakthroughs' in her symptoms, characterized by severe panting and significant energy expenditure to breathe, impacting her ability to function independently. In December and January, exacerbations required treatment with prednisone and antibiotics, which were effective. The most recent exacerbation occurred on February 9th, for which she sought emergency care and received prednisone and doxycycline.  She is currently on Trelegy and uses albuterol as needed. Prednisone has been particularly effective in managing her symptoms. She also takes Singulair (montelukast) at bedtime, mirtazapine to increase appetite and aid sleep, and gabapentin for neuropathy in her feet. She sleeps well but experiences heavy breathing upon waking.  She lives alone and mentions that her symptoms cause her to stop between tasks to regain control. She has been using a flutter valve.      No Known Allergies  Immunization History  Administered Date(s) Administered   Fluad Quad(high Dose  65+) 08/31/2022   Influenza,inj,quad, With Preservative 08/30/2017   PFIZER(Purple Top)SARS-COV-2 Vaccination 12/20/2019, 01/10/2020    Past Medical History:  Diagnosis Date   Anemia    Arthritis    "thumbs" (06/27/2015)   Bilateral renal artery stenosis (HCC)    Cataract    Chronic bronchitis (HCC)    Chronic lower back pain    COPD (chronic obstructive pulmonary disease) (HCC)    GERD (gastroesophageal reflux disease)    Headache    "probably weekly" (06/27/2015)   Hypertension    Hypertriglyceridemia    Migraine    "maybe monthly" (06/27/2015)   Pneumonia     Tobacco History: Social History   Tobacco Use  Smoking Status Former   Current packs/day: 0.50   Average packs/day: 0.5 packs/day for 20.0 years (10.0 ttl pk-yrs)   Types: Cigarettes  Smokeless Tobacco Never  Tobacco Comments   'quit smoking in the 1990's?"   Counseling given: Not Answered Tobacco comments: 'quit smoking in the 1990's?"   Outpatient Medications Prior to Visit  Medication Sig Dispense Refill   albuterol (PROVENTIL) (2.5 MG/3ML) 0.083% nebulizer solution INHALE 3 ML BY NEBULIZATION EVERY 6 HOURS AS NEEDED FOR WHEEZING OR SHORTNESS OF BREATH 75 mL 12   albuterol (VENTOLIN HFA) 108 (90 Base) MCG/ACT inhaler TAKE 2 PUFFS BY MOUTH EVERY 6 HOURS AS NEEDED FOR WHEEZE OR SHORTNESS OF BREATH 153 g 2   amLODipine (NORVASC) 5 MG tablet Take 5 mg by mouth daily.     aspirin EC 81 MG tablet 1 tablet Orally Once a day     atorvastatin (LIPITOR) 20 MG tablet TAKE 1 TABLET BY MOUTH EVERY DAY 90 tablet 3   clopidogrel (  PLAVIX) 75 MG tablet Take 1 tablet by mouth daily.     doxycycline (VIBRA-TABS) 100 MG tablet Take 1 tablet (100 mg total) by mouth 2 (two) times daily. 14 tablet 0   estradiol (ESTRACE VAGINAL) 0.1 MG/GM vaginal cream Place 1 g vaginally 2 (two) times a week. 42.5 g 3   gabapentin (NEURONTIN) 100 MG capsule Take 100 mg by mouth at bedtime.     HYDROcodone-acetaminophen (NORCO) 10-325 MG tablet  Take 1 tablet by mouth 3 (three) times daily as needed (pain).     ipratropium (ATROVENT) 0.03 % nasal spray PLACE 2 SPRAYS INTO BOTH NOSTRILS 2 (TWO) TIMES DAILY AS NEEDED FOR RHINITIS. 90 mL 1   losartan (COZAAR) 100 MG tablet Take 100 mg by mouth every evening.     mirtazapine (REMERON) 15 MG tablet Take 15 mg by mouth at bedtime.     montelukast (SINGULAIR) 10 MG tablet Take 10 mg by mouth at bedtime.   6   Multiple Vitamins-Minerals (MULTIVITAMIN PO) Take 1 tablet by mouth daily.      Multiple Vitamins-Minerals (PRESERVISION AREDS 2) CAPS Take 1 capsule by mouth 2 (two) times daily.     omeprazole (PRILOSEC) 40 MG capsule omeprazole 40 mg capsule,delayed release   40 mg twice a day by oral route.     predniSONE (DELTASONE) 10 MG tablet 6,5,4,3,2,1 taper 21 tablet 0   propranolol ER (INDERAL LA) 120 MG 24 hr capsule Take 120 mg by mouth daily.  3   TRELEGY ELLIPTA 200-62.5-25 MCG/ACT AEPB INHALE 1 PUFF BY MOUTH EVERY DAY 60 each 3   No facility-administered medications prior to visit.   Review of Systems  Review of Systems  Constitutional:  Positive for fatigue and unexpected weight change.  HENT: Negative.    Respiratory:  Positive for cough and shortness of breath.   Cardiovascular: Negative.    Physical Exam  LMP 11/30/1976  Physical Exam Constitutional:      General: She is not in acute distress.    Appearance: Normal appearance. She is not ill-appearing.  HENT:     Head: Normocephalic and atraumatic.  Cardiovascular:     Rate and Rhythm: Normal rate and regular rhythm.  Pulmonary:     Effort: Pulmonary effort is normal.     Breath sounds: Normal breath sounds. No wheezing or rhonchi.     Comments: CTA- diminished  Musculoskeletal:        General: Normal range of motion.     Cervical back: Normal range of motion and neck supple.  Skin:    General: Skin is warm and dry.  Neurological:     General: No focal deficit present.     Mental Status: She is alert and  oriented to person, place, and time. Mental status is at baseline.  Psychiatric:        Mood and Affect: Mood normal.        Behavior: Behavior normal.        Thought Content: Thought content normal.        Judgment: Judgment normal.      Lab Results:  CBC    Component Value Date/Time   WBC 7.0 05/21/2021 0138   RBC 3.64 (L) 05/21/2021 0138   HGB 11.2 (L) 05/21/2021 0138   HCT 34.2 (L) 05/21/2021 0138   PLT 203 05/21/2021 0138   MCV 94.0 05/21/2021 0138   MCH 30.8 05/21/2021 0138   MCHC 32.7 05/21/2021 0138   RDW 15.6 (H) 05/21/2021 0138  LYMPHSABS 2.3 06/27/2015 0025   MONOABS 0.6 06/27/2015 0025   EOSABS 0.3 06/27/2015 0025   BASOSABS 0.1 06/27/2015 0025    BMET    Component Value Date/Time   NA 134 (L) 05/21/2021 0138   K 3.9 05/21/2021 0138   CL 102 05/21/2021 0138   CO2 22 05/21/2021 0138   GLUCOSE 251 (H) 05/21/2021 0138   BUN 14 05/21/2021 0138   CREATININE 0.70 05/21/2021 0138   CALCIUM 8.4 (L) 05/21/2021 0138   GFRNONAA >60 05/21/2021 0138   GFRAA >60 07/16/2020 0235    BNP No results found for: "BNP"  ProBNP No results found for: "PROBNP"  Imaging: DG Chest 2 View Result Date: 01/09/2024 CLINICAL DATA:  Cough and shortness of breath for several days. Hypoxia. EXAM: CHEST - 2 VIEW COMPARISON:  07/24/2020 FINDINGS: The heart size and mediastinal contours are within normal limits. Marked pulmonary hyperinflation again seen, consistent with emphysema. Both lungs are clear. The visualized skeletal structures are unremarkable. IMPRESSION: Emphysema. No active cardiopulmonary disease. Electronically Signed   By: Danae Orleans M.D.   On: 01/09/2024 13:51     Assessment & Plan:  1. Chronic obstructive pulmonary disease, unspecified COPD type (HCC) (Primary) - CBC with Differential; Future - IgE; Future     COPD/Emphysema Frequent exacerbations with dyspnea and cough. Patient reports significant improvement with prednisone. Currently on Trelegy and  albuterol as needed. Considering the patient's age and quality of life, the benefits of a maintenance dose of prednisone may outweigh the risks. -Start prednisone 20mg  for 5 days, then 10mg  for 5 days, then stay on maintenance dose prednisone 5mg  daily until follow-up in April  -Continue Trelegy , Singulair and albuterol as needed. -Consider using albuterol nebulizer first thing in the morning, followed by flutter valve, then Trelegy. -Order CBC and IgE levels to assess for allergic component to COPD. -Reassess patient's response to prednisone maintenance dose at follow-up visit with Dr. Francine Graven in April. Potential other options would be addition of Dupixent or PDE4 inhibitor (daliresp or ohtuvayre) to reduce number of flare-up. Cost and medication administration should also be taken into account.   Thrush -RX Nystatin 5ml QID  General Health Maintenance -Continue Mirtazapine for appetite stimulation and sleep aid. -Continue Gabapentin for neuropathy.      Glenford Bayley, NP 01/26/2024

## 2024-01-26 NOTE — Patient Instructions (Signed)
 Recommendations: Take 20mg  prednisone for 5 days, 10mg  x 5 days, then stay on 5mg  daily until follow-up Continue Trelegy daily Use Albuterol nebulizer when you first wake up followed by flutter valve and Trelegy   Orders: Labs today/ checking cbc and IGE (allergy markers)  Follow-up Please schedule visit with Dr. Francine Graven in April

## 2024-01-27 LAB — CBC WITH DIFFERENTIAL/PLATELET
Absolute Lymphocytes: 2579 {cells}/uL (ref 850–3900)
Absolute Monocytes: 813 {cells}/uL (ref 200–950)
Basophils Absolute: 64 {cells}/uL (ref 0–200)
Basophils Relative: 0.6 %
Eosinophils Absolute: 107 {cells}/uL (ref 15–500)
Eosinophils Relative: 1 %
HCT: 32.8 % — ABNORMAL LOW (ref 35.0–45.0)
Hemoglobin: 8.2 g/dL — ABNORMAL LOW (ref 11.7–15.5)
MCH: 18.2 pg — ABNORMAL LOW (ref 27.0–33.0)
MCHC: 25 g/dL — ABNORMAL LOW (ref 32.0–36.0)
MCV: 72.9 fL — ABNORMAL LOW (ref 80.0–100.0)
MPV: 10.3 fL (ref 7.5–12.5)
Monocytes Relative: 7.6 %
Neutro Abs: 7137 {cells}/uL (ref 1500–7800)
Neutrophils Relative %: 66.7 %
Platelets: 281 10*3/uL (ref 140–400)
RBC: 4.5 10*6/uL (ref 3.80–5.10)
RDW: 18.2 % — ABNORMAL HIGH (ref 11.0–15.0)
Total Lymphocyte: 24.1 %
WBC: 10.7 10*3/uL (ref 3.8–10.8)

## 2024-01-27 LAB — IGE: IgE (Immunoglobulin E), Serum: 10 kU/L (ref <=114)

## 2024-01-29 ENCOUNTER — Emergency Department (HOSPITAL_BASED_OUTPATIENT_CLINIC_OR_DEPARTMENT_OTHER)

## 2024-01-29 ENCOUNTER — Observation Stay (HOSPITAL_BASED_OUTPATIENT_CLINIC_OR_DEPARTMENT_OTHER)
Admission: EM | Admit: 2024-01-29 | Discharge: 2024-01-31 | Disposition: A | Discharge status: Home / Self Care | Attending: Internal Medicine | Admitting: Internal Medicine

## 2024-01-29 ENCOUNTER — Emergency Department (HOSPITAL_COMMUNITY): Admitting: Certified Registered Nurse Anesthetist

## 2024-01-29 ENCOUNTER — Encounter (HOSPITAL_COMMUNITY)
Admission: EM | Disposition: A | Discharge status: Home / Self Care | Payer: Self-pay | Source: Home / Self Care | Attending: Emergency Medicine

## 2024-01-29 ENCOUNTER — Emergency Department (HOSPITAL_BASED_OUTPATIENT_CLINIC_OR_DEPARTMENT_OTHER): Admitting: Certified Registered Nurse Anesthetist

## 2024-01-29 ENCOUNTER — Other Ambulatory Visit: Payer: Self-pay

## 2024-01-29 ENCOUNTER — Encounter (HOSPITAL_BASED_OUTPATIENT_CLINIC_OR_DEPARTMENT_OTHER): Payer: Self-pay | Admitting: Emergency Medicine

## 2024-01-29 DIAGNOSIS — K219 Gastro-esophageal reflux disease without esophagitis: Secondary | ICD-10-CM | POA: Diagnosis present

## 2024-01-29 DIAGNOSIS — K648 Other hemorrhoids: Secondary | ICD-10-CM

## 2024-01-29 DIAGNOSIS — K209 Esophagitis, unspecified without bleeding: Secondary | ICD-10-CM | POA: Diagnosis not present

## 2024-01-29 DIAGNOSIS — I739 Peripheral vascular disease, unspecified: Secondary | ICD-10-CM | POA: Diagnosis present

## 2024-01-29 DIAGNOSIS — Z03821 Encounter for observation for suspected ingested foreign body ruled out: Secondary | ICD-10-CM | POA: Diagnosis not present

## 2024-01-29 DIAGNOSIS — Z79899 Other long term (current) drug therapy: Secondary | ICD-10-CM | POA: Diagnosis not present

## 2024-01-29 DIAGNOSIS — Z87891 Personal history of nicotine dependence: Secondary | ICD-10-CM | POA: Insufficient documentation

## 2024-01-29 DIAGNOSIS — Z8673 Personal history of transient ischemic attack (TIA), and cerebral infarction without residual deficits: Secondary | ICD-10-CM

## 2024-01-29 DIAGNOSIS — E785 Hyperlipidemia, unspecified: Secondary | ICD-10-CM | POA: Diagnosis present

## 2024-01-29 DIAGNOSIS — Z8709 Personal history of other diseases of the respiratory system: Secondary | ICD-10-CM

## 2024-01-29 DIAGNOSIS — T182XXA Foreign body in stomach, initial encounter: Secondary | ICD-10-CM

## 2024-01-29 DIAGNOSIS — T18108A Unspecified foreign body in esophagus causing other injury, initial encounter: Secondary | ICD-10-CM

## 2024-01-29 DIAGNOSIS — J449 Chronic obstructive pulmonary disease, unspecified: Secondary | ICD-10-CM | POA: Diagnosis not present

## 2024-01-29 DIAGNOSIS — R1310 Dysphagia, unspecified: Secondary | ICD-10-CM | POA: Diagnosis not present

## 2024-01-29 DIAGNOSIS — Z7982 Long term (current) use of aspirin: Secondary | ICD-10-CM | POA: Insufficient documentation

## 2024-01-29 DIAGNOSIS — Z7902 Long term (current) use of antithrombotics/antiplatelets: Secondary | ICD-10-CM | POA: Insufficient documentation

## 2024-01-29 DIAGNOSIS — D649 Anemia, unspecified: Secondary | ICD-10-CM | POA: Diagnosis not present

## 2024-01-29 DIAGNOSIS — K5790 Diverticulosis of intestine, part unspecified, without perforation or abscess without bleeding: Secondary | ICD-10-CM

## 2024-01-29 DIAGNOSIS — D509 Iron deficiency anemia, unspecified: Secondary | ICD-10-CM

## 2024-01-29 DIAGNOSIS — T18128A Food in esophagus causing other injury, initial encounter: Principal | ICD-10-CM

## 2024-01-29 DIAGNOSIS — J441 Chronic obstructive pulmonary disease with (acute) exacerbation: Secondary | ICD-10-CM

## 2024-01-29 DIAGNOSIS — I701 Atherosclerosis of renal artery: Secondary | ICD-10-CM | POA: Diagnosis present

## 2024-01-29 DIAGNOSIS — I10 Essential (primary) hypertension: Secondary | ICD-10-CM | POA: Diagnosis present

## 2024-01-29 DIAGNOSIS — J9611 Chronic respiratory failure with hypoxia: Secondary | ICD-10-CM

## 2024-01-29 DIAGNOSIS — W44F3XA Food entering into or through a natural orifice, initial encounter: Secondary | ICD-10-CM | POA: Insufficient documentation

## 2024-01-29 DIAGNOSIS — J9601 Acute respiratory failure with hypoxia: Secondary | ICD-10-CM

## 2024-01-29 DIAGNOSIS — Z87898 Personal history of other specified conditions: Secondary | ICD-10-CM

## 2024-01-29 DIAGNOSIS — Z8774 Personal history of (corrected) congenital malformations of heart and circulatory system: Secondary | ICD-10-CM

## 2024-01-29 HISTORY — PX: ESOPHAGOGASTRODUODENOSCOPY: SHX5428

## 2024-01-29 LAB — DIFFERENTIAL
Abs Immature Granulocytes: 0.05 10*3/uL (ref 0.00–0.07)
Basophils Absolute: 0 10*3/uL (ref 0.0–0.1)
Basophils Relative: 0 %
Eosinophils Absolute: 0 10*3/uL (ref 0.0–0.5)
Eosinophils Relative: 0 %
Immature Granulocytes: 1 %
Lymphocytes Relative: 16 %
Lymphs Abs: 1.1 10*3/uL (ref 0.7–4.0)
Monocytes Absolute: 0.3 10*3/uL (ref 0.1–1.0)
Monocytes Relative: 5 %
Neutro Abs: 5.2 10*3/uL (ref 1.7–7.7)
Neutrophils Relative %: 78 %

## 2024-01-29 LAB — CBC
HCT: 30.5 % — ABNORMAL LOW (ref 36.0–46.0)
Hemoglobin: 7.6 g/dL — ABNORMAL LOW (ref 12.0–15.0)
MCH: 19 pg — ABNORMAL LOW (ref 26.0–34.0)
MCHC: 24.9 g/dL — ABNORMAL LOW (ref 30.0–36.0)
MCV: 76.3 fL — ABNORMAL LOW (ref 80.0–100.0)
Platelets: 195 10*3/uL (ref 150–400)
RBC: 4 MIL/uL (ref 3.87–5.11)
RDW: 20.7 % — ABNORMAL HIGH (ref 11.5–15.5)
WBC: 6.9 10*3/uL (ref 4.0–10.5)
nRBC: 0.3 % — ABNORMAL HIGH (ref 0.0–0.2)

## 2024-01-29 LAB — BASIC METABOLIC PANEL
Anion gap: 7 (ref 5–15)
BUN: 20 mg/dL (ref 8–23)
CO2: 27 mmol/L (ref 22–32)
Calcium: 8.6 mg/dL — ABNORMAL LOW (ref 8.9–10.3)
Chloride: 106 mmol/L (ref 98–111)
Creatinine, Ser: 0.61 mg/dL (ref 0.44–1.00)
GFR, Estimated: >60 mL/min (ref >60)
Glucose, Bld: 149 mg/dL — ABNORMAL HIGH (ref 70–99)
Potassium: 4 mmol/L (ref 3.5–5.1)
Sodium: 140 mmol/L (ref 135–145)

## 2024-01-29 LAB — OCCULT BLOOD X 1 CARD TO LAB, STOOL: Fecal Occult Bld: NEGATIVE

## 2024-01-29 LAB — FERRITIN: Ferritin: 4 ng/mL — ABNORMAL LOW (ref 11–307)

## 2024-01-29 SURGERY — EGD (ESOPHAGOGASTRODUODENOSCOPY)
Anesthesia: General

## 2024-01-29 SURGERY — REMOVAL, FOREIGN BODY
Anesthesia: General

## 2024-01-29 MED ORDER — GLUCAGON HCL RDNA (DIAGNOSTIC) 1 MG IJ SOLR
1.0000 mg | Freq: Once | INTRAMUSCULAR | Status: AC
  Administered 2024-01-29: 1 mg via INTRAVENOUS
  Filled 2024-01-29: qty 1

## 2024-01-29 MED ORDER — ALBUTEROL SULFATE HFA 108 (90 BASE) MCG/ACT IN AERS
INHALATION_SPRAY | RESPIRATORY_TRACT | Status: DC | PRN
  Administered 2024-01-29: 2 via RESPIRATORY_TRACT

## 2024-01-29 MED ORDER — ONDANSETRON HCL 4 MG/2ML IJ SOLN
INTRAMUSCULAR | Status: DC | PRN
  Administered 2024-01-29: 4 mg via INTRAVENOUS

## 2024-01-29 MED ORDER — MORPHINE SULFATE (PF) 2 MG/ML IV SOLN
2.0000 mg | Freq: Once | INTRAVENOUS | Status: AC
  Administered 2024-01-29: 2 mg via INTRAVENOUS
  Filled 2024-01-29: qty 1

## 2024-01-29 MED ORDER — SUCCINYLCHOLINE 20MG/ML (10ML) SYRINGE FOR MEDFUSION PUMP - OPTIME
INTRAMUSCULAR | Status: DC | PRN
  Administered 2024-01-29: 40 mg via INTRAVENOUS

## 2024-01-29 MED ORDER — PANTOPRAZOLE SODIUM 20 MG PO TBEC
20.0000 mg | DELAYED_RELEASE_TABLET | Freq: Every day | ORAL | Status: DC
  Administered 2024-01-30 – 2024-01-31 (×2): 20 mg via ORAL
  Filled 2024-01-29 (×4): qty 1

## 2024-01-29 MED ORDER — ONDANSETRON HCL 4 MG/2ML IJ SOLN
4.0000 mg | Freq: Once | INTRAMUSCULAR | Status: AC
  Administered 2024-01-29: 4 mg via INTRAVENOUS
  Filled 2024-01-29: qty 2

## 2024-01-29 MED ORDER — PROPOFOL 10 MG/ML IV BOLUS
INTRAVENOUS | Status: DC | PRN
  Administered 2024-01-29: 80 mg via INTRAVENOUS

## 2024-01-29 MED ORDER — LACTATED RINGERS IV SOLN: INTRAVENOUS | Status: DC | PRN

## 2024-01-29 MED ORDER — DEXAMETHASONE SODIUM PHOSPHATE 10 MG/ML IJ SOLN
INTRAMUSCULAR | Status: DC | PRN
  Administered 2024-01-29: 5 mg via INTRAVENOUS

## 2024-01-29 MED ORDER — LIDOCAINE HCL (CARDIAC) PF 100 MG/5ML IV SOSY
PREFILLED_SYRINGE | INTRAVENOUS | Status: DC | PRN
Start: 2024-01-29 — End: 2024-01-30
  Administered 2024-01-29: 20 mg via INTRAVENOUS

## 2024-01-29 MED ORDER — GLYCOPYRROLATE 0.2 MG/ML IJ SOLN
INTRAMUSCULAR | Status: DC | PRN
  Administered 2024-01-29: .1 mg via INTRAVENOUS

## 2024-01-29 NOTE — H&P (Signed)
 [facility] History and Physical   Primary Care Physician:  Adrian Prince, MD   Reason for Procedure:   Food impaction  Plan:    EGD with possible intervention to disimpact food lodged in the esophagus     HPI: Jessica [name] is a 89 y.o. female with multiple comorbidities, COPD, chronic GERD presented to [facility] with food impaction.  She was eating steak burger for dinner, after she took the first bite she felt it lodged in her esophagus.  She is unable to even swallow secretions, is drooling.  She was recently seen in pulmonary office, was noted to have downtrending hemoglobin 8.2, slightly lower in the ER.  She was treated for oral thrush with nystatin.  She is on chronic narcotics.  The risks and benefits as well as alternatives of endoscopic procedure(s) have been discussed and reviewed. All questions answered. The patient agrees to proceed.    Past Medical History:  Diagnosis Date   Anemia    Arthritis    "thumbs" (06/27/2015)   Bilateral renal artery stenosis (HCC)    Cataract    Chronic bronchitis (HCC)    Chronic lower back pain    COPD (chronic obstructive pulmonary disease) (HCC)    GERD (gastroesophageal reflux disease)    Headache    "probably weekly" (06/27/2015)   Hypertension    Hypertriglyceridemia    Migraine    "maybe monthly" (06/27/2015)   Pneumonia     Past Surgical History:  Procedure Laterality Date   BALLOON DILATION  1980's?   "for renal stenosis"   CATARACT EXTRACTION, BILATERAL     ENDARTERECTOMY FEMORAL Right 05/15/2020   Procedure: Right Common Femoral Endarterectomy;  Surgeon: Cephus Shelling, MD;  Location: [facility] OR;  Service: Vascular;  Laterality: Right;   FEMORAL ARTERY EXPLORATION Left 05/20/2021   Procedure: LEFT LEG THROMBECTOMY WITH LEFT FEMORAL  ENDARTERECTOMY AND PATCH ANGIOPLASTY;  Surgeon: Larina Earthly, MD;  Location: [facility] OR;  Service: Vascular;  Laterality: Left;   PATCH ANGIOPLASTY Right 05/15/2020    Procedure: Common Femoral Artery Patch Angioplasty using XenoSure Bovine Pericardial Patch;  Surgeon: Cephus Shelling, MD;  Location: [facility] OR;  Service: Vascular;  Laterality: Right;   PERIPHERAL VASCULAR BALLOON ANGIOPLASTY  05/15/2021   Procedure: PERIPHERAL VASCULAR BALLOON ANGIOPLASTY;  Surgeon: Cephus Shelling, MD;  Location: [facility] INVASIVE [facility] LAB;  Service: Cardiovascular;;   Celiac   PERIPHERAL VASCULAR INTERVENTION  05/15/2020   Procedure: PERIPHERAL VASCULAR INTERVENTION;  Surgeon: Cephus Shelling, MD;  Location: [facility];  Service: Cardiovascular;;  Celiac   RECONSTRUCTION OF EYELID Bilateral 01/2016   Upper and lower eyelid   RENAL ANGIOPLASTY     TOE SURGERY Bilateral    "straightened big toe"   TONSILLECTOMY  ~ 1941   VAGINAL HYSTERECTOMY  1973   VISCERAL ANGIOGRAPHY N/A 05/15/2020   Procedure: MESENTERIC  ANGIOGRAPHY;  Surgeon: Cephus Shelling, MD;  Location: [facility];  Service: Cardiovascular;  Laterality: N/A;   VISCERAL ANGIOGRAPHY N/A 05/15/2021   Procedure: MESENTRIC ANGIOGRAPHY;  Surgeon: Cephus Shelling, MD;  Location: [facility];  Service: Cardiovascular;  Laterality: N/A;    Prior to Admission medications   Medication Sig Start Date End Date Taking? Authorizing Provider  albuterol (PROVENTIL) (2.5 MG/3ML) 0.083% nebulizer solution INHALE 3 ML BY NEBULIZATION EVERY 6 HOURS AS NEEDED FOR WHEEZING OR SHORTNESS OF BREATH 04/01/23   Martina Sinner, MD  albuterol (VENTOLIN HFA) 108 (90 Base) MCG/ACT inhaler  TAKE 2 PUFFS BY MOUTH EVERY 6 HOURS AS NEEDED FOR WHEEZE OR SHORTNESS OF BREATH 10/22/23   Martina Sinner, MD  amLODipine (NORVASC) 5 MG tablet Take 5 mg by mouth daily.    [provider]  aspirin EC 81 MG tablet 1 tablet Orally Once a day    [provider]  atorvastatin (LIPITOR) 20 MG tablet TAKE 1 TABLET BY MOUTH EVERY DAY 03/29/23   Maeola Harman, MD  clopidogrel (PLAVIX) 75 MG tablet Take  1 tablet by mouth daily.    [provider]  doxycycline (VIBRA-TABS) 100 MG tablet Take 1 tablet (100 mg total) by mouth 2 (two) times daily. 01/09/24   Elson Areas, PA-C  estradiol (ESTRACE VAGINAL) 0.1 MG/GM vaginal cream Place 1 g vaginally 2 (two) times a week. 07/02/22   Wyline Beady A, NP  gabapentin (NEURONTIN) 100 MG capsule Take 100 mg by mouth at bedtime. 04/30/20   [provider]  HYDROcodone-acetaminophen (NORCO) 10-325 MG tablet Take 1 tablet by mouth 3 (three) times daily as needed (pain).    [provider]  ipratropium (ATROVENT) 0.03 % nasal spray PLACE 2 SPRAYS INTO BOTH NOSTRILS 2 (TWO) TIMES DAILY AS NEEDED FOR RHINITIS. 02/12/23   Martina Sinner, MD  losartan (COZAAR) 100 MG tablet Take 100 mg by mouth every evening. 12/12/18   [provider]  mirtazapine (REMERON) 15 MG tablet Take 15 mg by mouth at bedtime.    [provider]  montelukast (SINGULAIR) 10 MG tablet Take 10 mg by mouth at bedtime.  04/28/16   [provider]  Multiple Vitamins-Minerals (MULTIVITAMIN PO) Take 1 tablet by mouth daily.     [provider]  Multiple Vitamins-Minerals (PRESERVISION AREDS 2) CAPS Take 1 capsule by mouth 2 (two) times daily.    [provider]  nystatin (MYCOSTATIN) 100000 UNIT/ML suspension Take 5 mLs (500,000 Units total) by mouth 4 (four) times daily. 01/26/24   Glenford Bayley, NP  omeprazole (PRILOSEC) 40 MG capsule omeprazole 40 mg capsule,delayed release   40 mg twice a day by oral route. 03/22/20   [provider]  predniSONE (DELTASONE) 5 MG tablet Take 4 tablets (20mg ) daily x 5 days; then 2 tablets (10mg ) daily x 5 days; then stay on 1 tablet (5mg ) daily until follow-up 01/26/24   Glenford Bayley, NP  propranolol ER (INDERAL LA) 120 MG 24 hr capsule Take 120 mg by mouth daily. 04/08/15   Adrian Prince, MD  Harrel Carina ELLIPTA 200-62.5-25 MCG/ACT AEPB INHALE 1 PUFF BY MOUTH EVERY DAY 10/15/23    Martina Sinner, MD    No current facility-administered medications for this encounter.    Allergies as of 01/29/2024   (No Known Allergies)    Family History  Problem Relation Age of Onset   Parkinsonism Father    Cancer Brother        Bladder   Hypertension Brother    Hyperlipidemia Brother    Stroke Maternal Grandfather    Colon cancer Neg Hx    Colon polyps Neg Hx    Stomach cancer Neg Hx     Social History   Socioeconomic History   Marital status: Widowed    Spouse name: Not on file   Number of children: 1   Years of education: Not on file   Highest education level: Not on file  Occupational History   Occupation: retired  Tobacco Use   Smoking status: Former    Current packs/day:  0.50    Average packs/day: 0.5 packs/day for 20.0 years (10.0 ttl pk-yrs)    Types: Cigarettes   Smokeless tobacco: Never   Tobacco comments:    'quit smoking in the 1990's?"  Vaping Use   Vaping status: Never Used  Substance and Sexual Activity   Alcohol use: Not Currently    Alcohol/week: 0.0 - 1.0 standard drinks of alcohol    Comment: rarely   Drug use: No   Sexual activity: Not Currently    Partners: Male    Birth control/protection: Surgical, Post-menopausal    Comment: hysterectomy  Other Topics Concern   Not on file  Social History Narrative   Not on file   Social Drivers of Health   Financial Resource Strain: Not on file  Food Insecurity: Not on file  Transportation Needs: Not on file  Physical Activity: Not on file  Stress: Not on file  Social Connections: Not on file  Intimate Partner Violence: Not on file    Review of Systems:  All other review of systems negative except as mentioned in the HPI.  Physical Exam: Vital signs in last 24 hours: Temp:  [97.3 F (36.3 C)-98 F (36.7 C)] 98 F (36.7 C) (03/01 1945) Pulse Rate:  [49-61] 58 (03/01 2145) Resp:  [15-18] 18 (03/01 2145) BP: (113-175)/(39-109) 149/54 (03/01 2145) SpO2:  [89 %-100 %] 99 %  (03/01 2145)   General:   Alert, NAD Lungs:  Clear .   Heart:  Regular rate and rhythm Abdomen:  Soft, nontender and nondistended. Neuro/Psych:  Alert and cooperative. Normal mood and affect. A and O x 3   K. Scherry Ran , MD [phone]

## 2024-01-29 NOTE — ED Notes (Signed)
 Labs drawn and sent to hold

## 2024-01-29 NOTE — Anesthesia Procedure Notes (Signed)
 Procedure Name: Intubation Date/Time: 01/29/2024 11:32 PM  Performed by: Hagar Sadiq T, CRNAPre-anesthesia Checklist: Patient identified, Emergency Drugs available, Suction available and Patient being monitored Patient Re-evaluated:Patient Re-evaluated prior to induction Oxygen Delivery Method: Circle system utilized Preoxygenation: Pre-oxygenation with 100% oxygen Induction Type: IV induction, Rapid sequence and Cricoid Pressure applied Ventilation: Mask ventilation without difficulty Laryngoscope Size: Mac and 4 Grade View: Grade I Tube type: Oral Tube size: 7.0 mm Number of attempts: 1 Airway Equipment and Method: Stylet and Oral airway Placement Confirmation: ETT inserted through vocal cords under direct vision, positive ETCO2 and breath sounds checked- equal and bilateral Tube secured with: Tape Dental Injury: Teeth and Oropharynx as per pre-operative assessment

## 2024-01-29 NOTE — ED Notes (Signed)
 This RN entered room due to oxygen saturation monitor alarming. Oxygen saturation reading 87% with good waveform. This RN applied 2L oxygen via nasal cannula and notified the provider.

## 2024-01-29 NOTE — Anesthesia Preprocedure Evaluation (Signed)
 Anesthesia Evaluation  Patient identified by MRN, date of birth, ID band Patient awake    Reviewed: Allergy & Precautions, NPO status , Patient's Chart, lab work & pertinent test results  Airway Mallampati: II  TM Distance: >3 FB Neck ROM: Full    Dental  (+) Dental Advisory Given   Pulmonary COPD,  COPD inhaler, former smoker   breath sounds clear to auscultation       Cardiovascular hypertension, Pt. on medications + Peripheral Vascular Disease   Rhythm:Regular Rate:Normal     Neuro/Psych  Headaches TIA Neuromuscular disease    GI/Hepatic Neg liver ROS,GERD  ,,  Endo/Other  negative endocrine ROS    Renal/GU negative Renal ROS     Musculoskeletal  (+) Arthritis ,    Abdominal   Peds  Hematology  (+) Blood dyscrasia, anemia   Anesthesia Other Findings   Reproductive/Obstetrics                             Anesthesia Physical Anesthesia Plan  ASA: 3 and emergent  Anesthesia Plan: General   Post-op Pain Management:    Induction: Intravenous and Rapid sequence  PONV Risk Score and Plan: 3 and Dexamethasone, Ondansetron and Treatment may vary due to age or medical condition  Airway Management Planned: Oral ETT  Additional Equipment:   Intra-op Plan:   Post-operative Plan: Extubation in OR  Informed Consent: I have reviewed the patients History and Physical, chart, labs and discussed the procedure including the risks, benefits and alternatives for the proposed anesthesia with the patient or authorized representative who has indicated his/her understanding and acceptance.     Dental advisory given  Plan Discussed with: CRNA  Anesthesia Plan Comments:        Anesthesia Quick Evaluation

## 2024-01-29 NOTE — ED Notes (Signed)
 Pt reports that she takes hydrocodone for her back pain; did not bring it with her. She states she takes it every 4 hours; last time was around [facility]

## 2024-01-29 NOTE — ED Triage Notes (Signed)
 Pt became strangled while eating supper (hamburger steak). She thinks it finally went down, but now nothing will go down, including water. Happened around 530 pm.

## 2024-01-29 NOTE — ED Provider Notes (Signed)
 [place] EMERGENCY DEPARTMENT AT [facility] Provider Note   CSN: [id number] Arrival date & time: 01/29/24  1819     History  Chief Complaint  Patient presents with   Dysphagia    Jessica [name] is a 89 y.o. female.  89 year old female with a history of GERD and COPD who presents to the emergency department with dysphagia.  Patient was eating dinner at 5:30 PM today.  She took a bite of a steak burger and reports that it got caught in her throat.  Thinks that the food may have been dislodged but is still unable to swallow.  Did have a pill get stuck in her throat recently but denies dysphagia otherwise.  No history of head or neck cancer or esophageal cancer.         Home Medications Prior to Admission medications   Medication Sig Start Date End Date Taking? Authorizing Provider  albuterol (PROVENTIL) (2.5 MG/3ML) 0.083% nebulizer solution INHALE 3 ML BY NEBULIZATION EVERY 6 HOURS AS NEEDED FOR WHEEZING OR SHORTNESS OF BREATH 04/01/23   Martina Sinner, MD  albuterol (VENTOLIN HFA) 108 (90 Base) MCG/ACT inhaler TAKE 2 PUFFS BY MOUTH EVERY 6 HOURS AS NEEDED FOR WHEEZE OR SHORTNESS OF BREATH 10/22/23   Martina Sinner, MD  amLODipine (NORVASC) 5 MG tablet Take 5 mg by mouth daily.    [provider]  aspirin EC 81 MG tablet 1 tablet Orally Once a day    [provider]  atorvastatin (LIPITOR) 20 MG tablet TAKE 1 TABLET BY MOUTH EVERY DAY 03/29/23   Maeola Harman, MD  clopidogrel (PLAVIX) 75 MG tablet Take 1 tablet by mouth daily.    [provider]  doxycycline (VIBRA-TABS) 100 MG tablet Take 1 tablet (100 mg total) by mouth 2 (two) times daily. 01/09/24   Elson Areas, PA-C  estradiol (ESTRACE VAGINAL) 0.1 MG/GM vaginal cream Place 1 g vaginally 2 (two) times a week. 07/02/22   Wyline Beady A, NP  gabapentin (NEURONTIN) 100 MG capsule Take 100 mg by mouth at bedtime. 04/30/20   [provider]  HYDROcodone-acetaminophen  (NORCO) 10-325 MG tablet Take 1 tablet by mouth 3 (three) times daily as needed (pain).    [provider]  ipratropium (ATROVENT) 0.03 % nasal spray PLACE 2 SPRAYS INTO BOTH NOSTRILS 2 (TWO) TIMES DAILY AS NEEDED FOR RHINITIS. 02/12/23   Martina Sinner, MD  losartan (COZAAR) 100 MG tablet Take 100 mg by mouth every evening. 12/12/18   [provider]  mirtazapine (REMERON) 15 MG tablet Take 15 mg by mouth at bedtime.    [provider]  montelukast (SINGULAIR) 10 MG tablet Take 10 mg by mouth at bedtime.  04/28/16   [provider]  Multiple Vitamins-Minerals (MULTIVITAMIN PO) Take 1 tablet by mouth daily.     [provider]  Multiple Vitamins-Minerals (PRESERVISION AREDS 2) CAPS Take 1 capsule by mouth 2 (two) times daily.    [provider]  nystatin (MYCOSTATIN) 100000 UNIT/ML suspension Take 5 mLs (500,000 Units total) by mouth 4 (four) times daily. 01/26/24   Glenford Bayley, NP  omeprazole (PRILOSEC) 40 MG capsule omeprazole 40 mg capsule,delayed release   40 mg twice a day by oral route. 03/22/20   [provider]  predniSONE (DELTASONE) 5 MG tablet Take 4 tablets (20mg ) daily x 5 days; then 2 tablets (10mg ) daily x 5 days; then stay on 1 tablet (5mg ) daily until follow-up 01/26/24   Clent Ridges,  Earnstine Regal, NP  propranolol ER (INDERAL LA) 120 MG 24 hr capsule Take 120 mg by mouth daily. 04/08/15   Adrian Prince, MD  Harrel Carina ELLIPTA 200-62.5-25 MCG/ACT AEPB INHALE 1 PUFF BY MOUTH EVERY DAY 10/15/23   Martina Sinner, MD      Allergies    Patient has no known allergies.    Review of Systems   Review of Systems  Physical Exam Updated Vital Signs BP (!) 141/109   Pulse (!) 52   Temp 98 F (36.7 C) (Oral)   Resp 18   LMP 11/30/1976   SpO2 96%  Physical Exam Vitals and nursing note reviewed.  Constitutional:      General: She is not in acute distress.    Appearance: She is well-developed.     Comments: Patient  occasionally having to spit up into an emesis basin  HENT:     Head: Normocephalic and atraumatic.     Right Ear: External ear normal.     Left Ear: External ear normal.     Nose: Nose normal.     Mouth/Throat:     Mouth: Mucous membranes are moist.     Pharynx: Oropharynx is clear.     Comments: No obvious foreign bodies in oropharynx Eyes:     Extraocular Movements: Extraocular movements intact.     Conjunctiva/sclera: Conjunctivae normal.     Pupils: Pupils are equal, round, and reactive to light.  Cardiovascular:     Rate and Rhythm: Normal rate and regular rhythm.     Heart sounds: No murmur heard. Pulmonary:     Effort: Pulmonary effort is normal. No respiratory distress.     Breath sounds: Normal breath sounds. No stridor.  Musculoskeletal:     Cervical back: Normal range of motion and neck supple.  Skin:    General: Skin is warm and dry.  Neurological:     Mental Status: She is alert and oriented to person, place, and time. Mental status is at baseline.  Psychiatric:        Mood and Affect: Mood normal.     ED Results / Procedures / Treatments   Labs (all labs ordered are listed, but only abnormal results are displayed) Labs Reviewed  BASIC METABOLIC PANEL - Abnormal; Notable for the following components:      Result Value   Glucose, Bld 149 (*)    Calcium 8.6 (*)    All other components within normal limits  CBC - Abnormal; Notable for the following components:   Hemoglobin 7.6 (*)    HCT 30.5 (*)    MCV 76.3 (*)    MCH 19.0 (*)    MCHC 24.9 (*)    RDW 20.7 (*)    nRBC 0.3 (*)    All other components within normal limits  OCCULT BLOOD X 1 CARD TO LAB, STOOL  DIFFERENTIAL  IRON AND TIBC  FERRITIN    EKG None  Radiology DG Chest Portable 1 View Result Date: 01/29/2024 CLINICAL DATA:  dysphagia, choking, 2L o2 requirement EXAM: PORTABLE CHEST - 1 VIEW COMPARISON:  01/09/2024 FINDINGS: Cardiomediastinal silhouette and pulmonary vasculature are within  normal limits. Lungs are hyperexpanded, but otherwise clear. IMPRESSION: No acute cardiopulmonary process. Electronically Signed   By: Acquanetta Belling M.D.   On: 01/29/2024 21:18    Procedures Procedures    Medications Ordered in ED Medications  glucagon (human recombinant) (GLUCAGEN) injection 1 mg (1 mg Intravenous Given 01/29/24 2006)  ondansetron (ZOFRAN) injection 4 mg (4  mg Intravenous Given 01/29/24 2005)    ED Course/ Medical Decision Making/ A&P Clinical Course as of 01/29/24 2123  Sat Jan 29, 2024  2034 Dr Lavon Paganini recommends transfer to endoscopy suite.  [RP]  2103 Dr. Doran Durand accepts for ED to ED transfer. [RP]    Clinical Course User Index [RP] Rondel Baton, MD                                 Medical Decision Making Amount and/or Complexity of Data Reviewed Labs: ordered. Radiology: ordered.  Risk Prescription drug management.   DELORA GRAVATT is a 89 y.o. female with comorbidities that complicate the patient evaluation including anemia, GERD, and COPD who presents to the emergency department with dysphagia.     Initial Ddx:  Food impaction, mass/malignancy, aspiration, COPD exacerbation  MDM/Course:  Patient presents emergency department after food impaction.  She is having difficulty coloring because her extrusions.  No other concerns for airway compromise at this time.  I do not hear any wheezing or rhonchi when I listen to her.  Was given glucagon, Zofran, and carbonated soda without relief of her symptoms.  Discussed with GI who recommends transfer to [facility] for endoscopy tonight.  Labs were sent which shows that she is anemic.  Had blood counts drawn 3 days ago that showed that her hemoglobin was 8 but 2 years prior to that (most recent labs that we have) looks like her hemoglobin was 11.  Denies any bleeding to me.  Hemoccult was negative.  Upon re-evaluation did require 2 L nasal cannula.  Chest x-ray was obtained to evaluate for aspiration though feel  this is less likely.  Patient will go to the [facility] emergency department for urgent endoscopy.  If she is unable to be weaned from oxygen or starts developing any symptoms of a GI bleed may benefit from admission for observation.  Unfortunately with limited recent lab work available unclear exactly how acute this drop in her hemoglobin is.  This patient presents to the ED for concern of complaints listed in HPI, this involves an extensive number of treatment options, and is a complaint that carries with it a high risk of complications and morbidity. Disposition including potential need for admission considered.   Dispo: Transfer to [facility] ED  Records reviewed Outpatient Clinic Notes The following labs were independently interpreted: CBC and show  anemia I independently reviewed the following imaging with scope of interpretation limited to determining acute life threatening conditions related to emergency care: Chest x-ray and agree with the radiologist interpretation with the following exceptions: none I personally reviewed and interpreted cardiac monitoring: normal sinus rhythm  I personally reviewed and interpreted the pt's EKG: see above for interpretation  I have reviewed the patients home medications and made adjustments as needed Consults: Gastroenterology Social Determinants of health:  Elderly  Portions of this note were generated with Scientist, clinical (histocompatibility and immunogenetics). Dictation errors may occur despite best attempts at proofreading.     Final Clinical Impression(s) / ED Diagnoses Final diagnoses:  Food impaction of esophagus, initial encounter  Anemia, unspecified type    Rx / DC Orders ED Discharge Orders     None         Rondel Baton, MD 01/29/24 2124

## 2024-01-29 NOTE — Op Note (Signed)
 [facility] Patient Name: Jessica [name] Procedure Date : 01/29/2024 MRN: [id number] Attending MD: Napoleon Form , MD, [id number] Date of Birth: 10/26/1932 CSN: [id number] Age: 89 Admit Type: Inpatient Procedure:                Upper GI endoscopy Indications:              Removal of food impaction in the esophagus Providers:                Napoleon Form, MD, Suzy Bouchard, RN, Rhodia Albright, Technician Referring MD:              Medicines:                General Anesthesia, See the Anesthesia note for                            documentation of the administered medications Complications:            No immediate complications. Estimated Blood Loss:     Estimated blood loss was minimal. Procedure:                Pre-Anesthesia Assessment:                           - Prior to the procedure, a History and Physical                            was performed, and patient medications and                            allergies were reviewed. The patient's tolerance of                            previous anesthesia was also reviewed. The risks                            and benefits of the procedure and the sedation                            options and risks were discussed with the patient.                            All questions were answered, and informed consent                            was obtained. Prior Anticoagulants: The patient                            last took Plavix (clopidogrel) on the day of the                            procedure. ASA Grade Assessment: III - A patient  with severe systemic disease. After reviewing the                            risks and benefits, the patient was deemed in                            satisfactory condition to undergo the procedure.                           After obtaining informed consent, the endoscope was                            passed under direct vision. Throughout  the                            procedure, the patient's blood pressure, pulse, and                            oxygen saturations were monitored continuously. The                            [id number]-H190 ([id number]) Olympus endoscope was introduced                            through the mouth, and advanced to the second part                            of duodenum. The upper GI endoscopy was                            accomplished without difficulty. The patient                            tolerated the procedure well. Scope In: Scope Out: Findings:      Esophagitis with no bleeding was found 25 to 26 cm from the incisors,       proximal esophagus, site of food impaction.      The exam of the esophagus was otherwise normal.      The stomach was normal. Noted small amount of food residue      The cardia and gastric fundus were normal on retroflexion.      The examined duodenum was normal. Impression:               - Esophagitis with no bleeding.                           - Normal stomach.                           - Normal examined duodenum.                           - No specimens collected. Recommendation:           - Mechanical soft diet.                           -  Use Protonix (pantoprazole) 20 mg PO daily.                           - Follow an antireflux regimen.                           - Return patient to hospital ward for observation. Procedure Code(s):        --- Professional ---                           [phone], Esophagogastroduodenoscopy, flexible,                            transoral; diagnostic, including collection of                            specimen(s) by brushing or washing, when performed                            (separate procedure) Diagnosis Code(s):        --- Professional ---                           K20.90, Esophagitis, unspecified without bleeding                           T18.108A, Unspecified foreign body in esophagus                            causing other  injury, initial encounter                           Z03.821, Encounter for observation for suspected                            ingested foreign body ruled out CPT copyright 2022 [employer]. All rights reserved. The codes documented in this report are preliminary and upon coder review may  be revised to meet current compliance requirements. Napoleon Form, MD 01/29/2024 11:55:24 PM This report has been signed electronically. Number of Addenda: 0

## 2024-01-30 ENCOUNTER — Encounter (HOSPITAL_COMMUNITY): Payer: Self-pay | Admitting: Internal Medicine

## 2024-01-30 DIAGNOSIS — E7849 Other hyperlipidemia: Secondary | ICD-10-CM

## 2024-01-30 DIAGNOSIS — Z87898 Personal history of other specified conditions: Secondary | ICD-10-CM

## 2024-01-30 DIAGNOSIS — Z8709 Personal history of other diseases of the respiratory system: Secondary | ICD-10-CM

## 2024-01-30 DIAGNOSIS — Z8673 Personal history of transient ischemic attack (TIA), and cerebral infarction without residual deficits: Secondary | ICD-10-CM | POA: Diagnosis not present

## 2024-01-30 DIAGNOSIS — W44F3XA Food entering into or through a natural orifice, initial encounter: Secondary | ICD-10-CM

## 2024-01-30 DIAGNOSIS — T18128A Food in esophagus causing other injury, initial encounter: Secondary | ICD-10-CM

## 2024-01-30 DIAGNOSIS — J441 Chronic obstructive pulmonary disease with (acute) exacerbation: Secondary | ICD-10-CM

## 2024-01-30 DIAGNOSIS — Z8774 Personal history of (corrected) congenital malformations of heart and circulatory system: Secondary | ICD-10-CM

## 2024-01-30 DIAGNOSIS — I701 Atherosclerosis of renal artery: Secondary | ICD-10-CM | POA: Diagnosis not present

## 2024-01-30 DIAGNOSIS — D649 Anemia, unspecified: Secondary | ICD-10-CM

## 2024-01-30 DIAGNOSIS — I739 Peripheral vascular disease, unspecified: Secondary | ICD-10-CM | POA: Diagnosis not present

## 2024-01-30 DIAGNOSIS — I10 Essential (primary) hypertension: Secondary | ICD-10-CM

## 2024-01-30 DIAGNOSIS — D509 Iron deficiency anemia, unspecified: Secondary | ICD-10-CM

## 2024-01-30 DIAGNOSIS — K219 Gastro-esophageal reflux disease without esophagitis: Secondary | ICD-10-CM

## 2024-01-30 DIAGNOSIS — K5790 Diverticulosis of intestine, part unspecified, without perforation or abscess without bleeding: Secondary | ICD-10-CM

## 2024-01-30 DIAGNOSIS — K648 Other hemorrhoids: Secondary | ICD-10-CM

## 2024-01-30 LAB — VITAMIN B12: Vitamin B-12: 429 pg/mL (ref 180–914)

## 2024-01-30 LAB — HEMOGLOBIN AND HEMATOCRIT, BLOOD
HCT: 35.6 % — ABNORMAL LOW (ref 36.0–46.0)
Hemoglobin: 9.8 g/dL — ABNORMAL LOW (ref 12.0–15.0)

## 2024-01-30 LAB — CBC
HCT: 28.6 % — ABNORMAL LOW (ref 36.0–46.0)
Hemoglobin: 7.1 g/dL — ABNORMAL LOW (ref 12.0–15.0)
MCH: 19.6 pg — ABNORMAL LOW (ref 26.0–34.0)
MCHC: 24.8 g/dL — ABNORMAL LOW (ref 30.0–36.0)
MCV: 78.8 fL — ABNORMAL LOW (ref 80.0–100.0)
Platelets: 149 10*3/uL — ABNORMAL LOW (ref 150–400)
RBC: 3.63 MIL/uL — ABNORMAL LOW (ref 3.87–5.11)
RDW: 20.1 % — ABNORMAL HIGH (ref 11.5–15.5)
WBC: 9.2 10*3/uL (ref 4.0–10.5)
nRBC: 0 % (ref 0.0–0.2)

## 2024-01-30 LAB — COMPREHENSIVE METABOLIC PANEL
ALT: 21 U/L (ref 0–44)
AST: 16 U/L (ref 15–41)
Albumin: 2.8 g/dL — ABNORMAL LOW (ref 3.5–5.0)
Alkaline Phosphatase: 56 U/L (ref 38–126)
Anion gap: 9 (ref 5–15)
BUN: 18 mg/dL (ref 8–23)
CO2: 26 mmol/L (ref 22–32)
Calcium: 8.3 mg/dL — ABNORMAL LOW (ref 8.9–10.3)
Chloride: 105 mmol/L (ref 98–111)
Creatinine, Ser: 0.52 mg/dL (ref 0.44–1.00)
GFR, Estimated: >60 mL/min (ref >60)
Glucose, Bld: 118 mg/dL — ABNORMAL HIGH (ref 70–99)
Potassium: 4.2 mmol/L (ref 3.5–5.1)
Sodium: 140 mmol/L (ref 135–145)
Total Bilirubin: 0.6 mg/dL (ref 0.0–1.2)
Total Protein: 5.2 g/dL — ABNORMAL LOW (ref 6.5–8.1)

## 2024-01-30 LAB — IRON AND TIBC
Iron: 25 ug/dL — ABNORMAL LOW (ref 28–170)
Saturation Ratios: 5 % — ABNORMAL LOW (ref 10.4–31.8)
TIBC: 469 ug/dL — ABNORMAL HIGH (ref 250–450)
UIBC: 444 ug/dL

## 2024-01-30 LAB — PREPARE RBC (CROSSMATCH)

## 2024-01-30 MED ORDER — DIPHENHYDRAMINE HCL 50 MG/ML IJ SOLN: 25.0000 mg | Freq: Four times a day (QID) | INTRAMUSCULAR | Status: DC | PRN

## 2024-01-30 MED ORDER — MIRTAZAPINE 15 MG PO TABS
15.0000 mg | ORAL_TABLET | Freq: Every day | ORAL | Status: DC
  Administered 2024-01-30: 15 mg via ORAL
  Filled 2024-01-30 (×2): qty 1

## 2024-01-30 MED ORDER — CLOPIDOGREL BISULFATE 75 MG PO TABS
75.0000 mg | ORAL_TABLET | Freq: Every day | ORAL | Status: DC
  Administered 2024-01-30 – 2024-01-31 (×2): 75 mg via ORAL
  Filled 2024-01-30 (×2): qty 1

## 2024-01-30 MED ORDER — ONDANSETRON HCL 4 MG PO TABS: 4.0000 mg | ORAL_TABLET | Freq: Four times a day (QID) | ORAL | Status: DC | PRN

## 2024-01-30 MED ORDER — MORPHINE SULFATE (PF) 2 MG/ML IV SOLN: 2.0000 mg | INTRAVENOUS | Status: DC | PRN

## 2024-01-30 MED ORDER — FERROUS SULFATE 325 (65 FE) MG PO TABS: 325.0000 mg | ORAL_TABLET | Freq: Every day | ORAL | Status: DC

## 2024-01-30 MED ORDER — ONDANSETRON HCL 4 MG/2ML IJ SOLN: 4.0000 mg | Freq: Four times a day (QID) | INTRAMUSCULAR | Status: DC | PRN

## 2024-01-30 MED ORDER — FERROUS SULFATE 325 (65 FE) MG PO TABS
325.0000 mg | ORAL_TABLET | Freq: Every day | ORAL | Status: DC
  Administered 2024-01-30: 325 mg via ORAL
  Filled 2024-01-30 (×2): qty 1

## 2024-01-30 MED ORDER — IPRATROPIUM-ALBUTEROL 0.5-2.5 (3) MG/3ML IN SOLN: 3.0000 mL | Freq: Four times a day (QID) | RESPIRATORY_TRACT | Status: DC | PRN

## 2024-01-30 MED ORDER — HYDRALAZINE HCL 20 MG/ML IJ SOLN: 5.0000 mg | Freq: Four times a day (QID) | INTRAMUSCULAR | Status: DC | PRN

## 2024-01-30 MED ORDER — SODIUM CHLORIDE 0.9% IV SOLUTION: Freq: Once | INTRAVENOUS | Status: AC

## 2024-01-30 MED ORDER — AMLODIPINE BESYLATE 5 MG PO TABS
5.0000 mg | ORAL_TABLET | Freq: Every day | ORAL | Status: DC
Start: 2024-01-30 — End: 2024-01-31
  Administered 2024-01-30 – 2024-01-31 (×2): 5 mg via ORAL
  Filled 2024-01-30 (×2): qty 1

## 2024-01-30 MED ORDER — BUDESONIDE 0.25 MG/2ML IN SUSP
0.2500 mg | Freq: Two times a day (BID) | RESPIRATORY_TRACT | Status: DC
  Administered 2024-01-31: 0.25 mg via RESPIRATORY_TRACT
  Filled 2024-01-30 (×2): qty 2

## 2024-01-30 MED ORDER — ASPIRIN 81 MG PO TBEC
81.0000 mg | DELAYED_RELEASE_TABLET | Freq: Every day | ORAL | Status: DC
  Administered 2024-01-30 – 2024-01-31 (×2): 81 mg via ORAL
  Filled 2024-01-30 (×2): qty 1

## 2024-01-30 MED ORDER — ATORVASTATIN CALCIUM 10 MG PO TABS
20.0000 mg | ORAL_TABLET | Freq: Every day | ORAL | Status: DC
  Administered 2024-01-30 – 2024-01-31 (×2): 20 mg via ORAL
  Filled 2024-01-30 (×2): qty 2

## 2024-01-30 MED ORDER — ACETAMINOPHEN 325 MG PO TABS
650.0000 mg | ORAL_TABLET | Freq: Four times a day (QID) | ORAL | Status: DC | PRN
  Filled 2024-01-30: qty 2

## 2024-01-30 MED ORDER — GABAPENTIN 100 MG PO CAPS: 100.0000 mg | ORAL_CAPSULE | Freq: Every day | ORAL | Status: DC

## 2024-01-30 MED ORDER — ACETAMINOPHEN 650 MG RE SUPP: 650.0000 mg | Freq: Four times a day (QID) | RECTAL | Status: DC | PRN

## 2024-01-30 MED ORDER — MONTELUKAST SODIUM 10 MG PO TABS
10.0000 mg | ORAL_TABLET | Freq: Every day | ORAL | Status: DC
  Administered 2024-01-30: 10 mg via ORAL
  Filled 2024-01-30: qty 1

## 2024-01-30 MED ORDER — SODIUM CHLORIDE 0.9% FLUSH
3.0000 mL | Freq: Two times a day (BID) | INTRAVENOUS | Status: DC
  Administered 2024-01-30 (×2): 3 mL via INTRAVENOUS

## 2024-01-30 MED ORDER — SODIUM CHLORIDE 0.9% FLUSH
3.0000 mL | INTRAVENOUS | Status: DC | PRN
  Administered 2024-01-30: 3 mL via INTRAVENOUS

## 2024-01-30 MED ORDER — UMECLIDINIUM BROMIDE 62.5 MCG/ACT IN AEPB
1.0000 | INHALATION_SPRAY | Freq: Every day | RESPIRATORY_TRACT | Status: DC
  Administered 2024-01-30: 1 via RESPIRATORY_TRACT
  Filled 2024-01-30: qty 7

## 2024-01-30 MED ORDER — LABETALOL HCL 5 MG/ML IV SOLN: 10.0000 mg | INTRAVENOUS | Status: DC | PRN

## 2024-01-30 MED ORDER — AMISULPRIDE (ANTIEMETIC) 5 MG/2ML IV SOLN: 10.0000 mg | Freq: Once | INTRAVENOUS | Status: DC | PRN

## 2024-01-30 MED ORDER — LOSARTAN POTASSIUM 50 MG PO TABS
100.0000 mg | ORAL_TABLET | Freq: Every evening | ORAL | Status: DC
  Administered 2024-01-30: 100 mg via ORAL
  Filled 2024-01-30: qty 2

## 2024-01-30 MED ORDER — IPRATROPIUM-ALBUTEROL 0.5-2.5 (3) MG/3ML IN SOLN: 3.0000 mL | RESPIRATORY_TRACT | Status: DC | PRN

## 2024-01-30 MED ORDER — SODIUM CHLORIDE 0.9 % IV SOLN
250.0000 mg | Freq: Once | INTRAVENOUS | Status: AC
  Administered 2024-01-30: 250 mg via INTRAVENOUS
  Filled 2024-01-30: qty 20

## 2024-01-30 MED ORDER — HYDROCODONE-ACETAMINOPHEN 7.5-325 MG PO TABS
1.0000 | ORAL_TABLET | Freq: Four times a day (QID) | ORAL | Status: DC | PRN
  Administered 2024-01-30 – 2024-01-31 (×4): 1 via ORAL
  Filled 2024-01-30 (×4): qty 1

## 2024-01-30 MED ORDER — NYSTATIN 100000 UNIT/ML MT SUSP
5.0000 mL | Freq: Four times a day (QID) | OROMUCOSAL | Status: DC
  Administered 2024-01-30 – 2024-01-31 (×4): 500000 [IU] via OROMUCOSAL
  Filled 2024-01-30 (×4): qty 5

## 2024-01-30 MED ORDER — HYDRALAZINE HCL 25 MG PO TABS: 25.0000 mg | ORAL_TABLET | ORAL | Status: DC | PRN

## 2024-01-30 MED ORDER — SODIUM CHLORIDE 0.9 % IV SOLN: 250.0000 mL | INTRAVENOUS | Status: DC | PRN

## 2024-01-30 MED ORDER — IPRATROPIUM-ALBUTEROL 0.5-2.5 (3) MG/3ML IN SOLN
3.0000 mL | Freq: Four times a day (QID) | RESPIRATORY_TRACT | Status: DC
  Filled 2024-01-30: qty 3

## 2024-01-30 MED ORDER — PREDNISONE 20 MG PO TABS
20.0000 mg | ORAL_TABLET | Freq: Once | ORAL | Status: AC
  Administered 2024-01-30: 20 mg via ORAL
  Filled 2024-01-30: qty 1

## 2024-01-30 MED ORDER — AMLODIPINE BESYLATE 5 MG PO TABS: 5.0000 mg | ORAL_TABLET | Freq: Every day | ORAL | Status: DC

## 2024-01-30 MED ORDER — AMISULPRIDE (ANTIEMETIC) 5 MG/2ML IV SOLN: 5.0000 mg | Freq: Once | INTRAVENOUS | Status: DC | PRN

## 2024-01-30 MED ORDER — FLUTICASONE FUROATE-VILANTEROL 200-25 MCG/ACT IN AEPB
1.0000 | INHALATION_SPRAY | Freq: Every day | RESPIRATORY_TRACT | Status: DC
  Administered 2024-01-30: 1 via RESPIRATORY_TRACT
  Filled 2024-01-30: qty 28

## 2024-01-30 MED ORDER — METHYLPREDNISOLONE SODIUM SUCC 40 MG IJ SOLR
40.0000 mg | Freq: Two times a day (BID) | INTRAMUSCULAR | Status: DC
  Administered 2024-01-30 – 2024-01-31 (×2): 40 mg via INTRAVENOUS
  Filled 2024-01-30 (×2): qty 1

## 2024-01-30 NOTE — OR PostOp (Signed)
 PACU TO INPATIENT HANDOFF REPORT  Name/Age/Gender Jessica [name] 89 y.o. female  Code Status Code Status History     Date Active Date Inactive Code Status Order ID Comments User Context   05/20/2021 1734 05/22/2021 1934 Full Code [id number]  Forestine Na Inpatient   05/15/2021 1542 05/15/2021 2233 Full Code [id number]  Cephus Shelling, MD Inpatient   07/15/2020 0815 07/18/2020 1735 Full Code [id number]  Clydie Braun, MD ED   05/15/2020 1607 05/17/2020 1614 Full Code [id number]  Milinda Antis, PA-C Inpatient   05/15/2020 1115 05/15/2020 1606 Full Code [id number]  Cephus Shelling, MD Inpatient   06/27/2015 1247 06/28/2015 2031 Full Code [id number]  Levie Heritage, DO ED       Home/SNF/Other   Chief Complaint Food impaction of esophagus, initial encounter [phone], W44.F3XA] Anemia, unspecified type [D64.9]  Level of Care/Admitting Diagnosis ED Disposition     ED Disposition  Transfer via Transport   Condition  --   Comment  The patient appears reasonably stabilized for transfer considering the current resources, flow, and capabilities available in the ED at this time, and I doubt any other [facility] requiring further screening and/or treatment in the ED prior to transfer is p resent.          Medical History Past Medical History:  Diagnosis Date   Anemia    Arthritis    "thumbs" (06/27/2015)   Bilateral renal artery stenosis (HCC)    Cataract    Chronic bronchitis (HCC)    Chronic lower back pain    COPD (chronic obstructive pulmonary disease) (HCC)    GERD (gastroesophageal reflux disease)    Headache    "probably weekly" (06/27/2015)   Hypertension    Hypertriglyceridemia    Migraine    "maybe monthly" (06/27/2015)   Pneumonia     Allergies No Known Allergies  IV Location/Drains/Wounds Patient Lines/Drains/Airways Status     Active Line/Drains/Airways     Name Placement date Placement time Site Days   Peripheral IV 01/29/24 20 G 1"  Anterior;Right Forearm 01/29/24  1840  Forearm  1            Labs/Imaging Results for orders placed or performed during the hospital encounter of 01/29/24 (from the past 48 hours)  Basic metabolic panel     Status: Abnormal   Collection Time: 01/29/24  8:02 PM  Result Value Ref Range   Sodium 140 135 - 145 mmol/L   Potassium 4.0 3.5 - 5.1 mmol/L   Chloride 106 98 - 111 mmol/L   CO2 27 22 - 32 mmol/L   Glucose, Bld 149 (H) 70 - 99 mg/dL    Comment: Glucose reference range applies only to samples taken after fasting for at least 8 hours.   BUN 20 8 - 23 mg/dL   Creatinine, Ser 3.87 0.44 - 1.00 mg/dL   Calcium 8.6 (L) 8.9 - 10.3 mg/dL   GFR, Estimated >56 >43 mL/min    Comment: (NOTE) Calculated using the CKD-EPI Creatinine Equation (2021)    Anion gap 7 5 - 15    Comment: Performed at [facility], [address] [place], [place] [address]  CBC     Status: Abnormal   Collection Time: 01/29/24  8:02 PM  Result Value Ref Range   WBC 6.9 4.0 - 10.5 K/uL   RBC 4.00 3.87 - 5.11 MIL/uL   Hemoglobin 7.6 (L) 12.0 - 15.0 g/dL    Comment: Reticulocyte Hemoglobin testing may be clinically  indicated, consider ordering this additional test [id number]    HCT 30.5 (L) 36.0 - 46.0 %   MCV 76.3 (L) 80.0 - 100.0 fL   MCH 19.0 (L) 26.0 - 34.0 pg   MCHC 24.9 (L) 30.0 - 36.0 g/dL   RDW 08.6 (H) 57.8 - 46.9 %   Platelets 195 150 - 400 K/uL   nRBC 0.3 (H) 0.0 - 0.2 %    Comment: Performed at [facility], [address] [place], [place] [address]  Differential     Status: None   Collection Time: 01/29/24  8:02 PM  Result Value Ref Range   Neutrophils Relative % 78 %   Neutro Abs 5.2 1.7 - 7.7 K/uL   Lymphocytes Relative 16 %   Lymphs Abs 1.1 0.7 - 4.0 K/uL   Monocytes Relative 5 %   Monocytes Absolute 0.3 0.1 - 1.0 K/uL   Eosinophils Relative 0 %   Eosinophils Absolute 0.0 0.0 - 0.5 K/uL   Basophils Relative 0 %   Basophils Absolute 0.0 0.0 -  0.1 K/uL   Immature Granulocytes 1 %   Abs Immature Granulocytes 0.05 0.00 - 0.07 K/uL    Comment: Performed at [facility], [address] [place], [place], [place] [address]  Occult blood card to lab, stool     Status: None   Collection Time: 01/29/24  9:01 PM  Result Value Ref Range   Fecal Occult Bld NEGATIVE NEGATIVE    Comment: Performed at [facility], [address] [place], [place] [address]  Ferritin (Iron Binding Protein)     Status: Abnormal   Collection Time: 01/29/24  9:01 PM  Result Value Ref Range   Ferritin 4 (L) 11 - 307 ng/mL    Comment: Performed at [facility], [address], [place], [place] [address]   DG Chest Portable 1 View Result Date: 01/29/2024 CLINICAL DATA:  dysphagia, choking, 2L o2 requirement EXAM: PORTABLE CHEST - 1 VIEW COMPARISON:  01/09/2024 FINDINGS: Cardiomediastinal silhouette and pulmonary vasculature are within normal limits. Lungs are hyperexpanded, but otherwise clear. IMPRESSION: No acute cardiopulmonary process. Electronically Signed   By: Acquanetta Belling M.D.   On: 01/29/2024 21:18    Pending Labs   Vitals/Pain Today's Vitals   01/30/24 0015 01/30/24 0030 01/30/24 0045 01/30/24 0100  BP: (!) 128/49 (!) 134/51 (!) 132/44 (!) 131/42  Pulse: 65 (!) 55 (!) 51 (!) 52  Resp: 20 11 14 12   Temp:      TempSrc:      SpO2: 95% 99% 92% 98%  Weight:      Height:      PainSc: Asleep Asleep Asleep Asleep    Isolation Precautions N/A  Administered Medications Periop Administered Meds from 01/29/2024 2245 to 01/30/2024 0131       Date/Time Order Dose Route Action Action by Comments    01/29/2024 2340 EST albuterol (VENTOLIN HFA) 108 (90 Base) MCG/ACT inhaler 2 puff Inhalation Given Flowers, Rokoshi T, CRNA --    01/29/2024 2331 EST dexamethasone (DECADRON) injection 5 mg Intravenous Given Flowers, Rokoshi T, CRNA --    01/29/2024 2323 EST glycopyrrolate (ROBINUL) injection 0.1 mg  Intravenous Given Flowers, Rokoshi T, CRNA --    01/29/2024 2351 EST lactated ringers infusion -- Intravenous Anesthesia Volume Adjustment Flowers, Rokoshi T, CRNA --    01/29/2024 2322 EST lactated ringers infusion -- Intravenous New Bag/Given Flowers, Rokoshi T, CRNA --    01/29/2024 2331 EST lidocaine (cardiac) 100 mg/30mL (XYLOCAINE) injection 2% 20  mg Intravenous Given Flowers, Rokoshi T, CRNA --    01/29/2024 2331 EST ondansetron (ZOFRAN) injection 4 mg Intravenous Given Flowers, Rokoshi T, CRNA --    01/29/2024 2331 EST propofol (DIPRIVAN) 10 mg/mL bolus/IV push 80 mg Intravenous Given Flowers, Rokoshi T, CRNA --    01/29/2024 2331 EST succinylcholine 20 mg/mL (10 mL) syringe for Medfusion Pump - Optime 40 mg Intravenous Given Flowers, Rokoshi T, CRNA --       Mobility walks with person assist

## 2024-01-30 NOTE — ED Provider Notes (Signed)
 1:47 AM Patient presenting to ED from PACU s/p endoscopy for dysphagia. Currently tolerating secretions. Saturations normal on 2L via [place]. Remaining vitals also stable. Patient alert to loud voice. No acute complaints at this time.  2:15 AM Case discussed with Dr. Janalyn Shy of [facility] who will assess patient for observation admission.  Vitals:   01/30/24 0045 01/30/24 0100 01/30/24 0225 01/30/24 0226  BP: (!) 132/44 (!) 131/42 (!) 159/55   Pulse: (!) 51 (!) 52    Resp: 14 12 13    Temp:    98.6 F (37 C)  TempSrc:    Oral  SpO2: 92% 98%    Weight:      Height:          Antony Madura, PA-C 01/30/24 0231    Gilda Crease, MD 01/30/24 [phone]

## 2024-01-30 NOTE — Progress Notes (Signed)
   01/30/24 1530  Assess: MEWS Score  Temp 97.9 F (36.6 C)  BP 124/65  Pulse Rate 64  Resp (!) 26  SpO2 100 %  O2 Device Room Air  Patient Activity (if Appropriate) In bed  Assess: MEWS Score  MEWS Temp 0  MEWS Systolic 0  MEWS Pulse 0  MEWS RR 2  MEWS LOC 0  MEWS Score 2  MEWS Score Color Yellow  Assess: if the MEWS score is Yellow or Red  Were vital signs accurate and taken at a resting state? Yes  Does the patient meet 2 or more of the SIRS criteria? No  MEWS guidelines implemented  Yes, yellow  Treat  MEWS Interventions Considered administering scheduled or prn medications/treatments as ordered  Take Vital Signs  Increase Vital Sign Frequency  Yellow: Q2hr x1, continue Q4hrs until patient remains green for 12hrs  Escalate  MEWS: Escalate Yellow: Discuss with charge nurse and consider notifying provider and/or RRT  Notify: Charge Nurse/RN  Name of Charge Nurse/RN Notified Kirtland Bouchard, RN  Assess: SIRS CRITERIA  SIRS Temperature  0  SIRS Respirations  1  SIRS Pulse 0  SIRS WBC 0  SIRS Score Sum  1

## 2024-01-30 NOTE — Care Management Obs Status (Signed)
 MEDICARE OBSERVATION STATUS NOTIFICATION   Patient Details  Name: Jessica [name] MRN: [id number] Date of Birth: 03/29/1932   Medicare Observation Status Notification Given:  Yes    Ronny Bacon, RN 01/30/2024, 2:13 PM

## 2024-01-30 NOTE — Hospital Course (Signed)
 Ms. Bordenave is an 89 yo female with PMH diverticulosis, hemorrhoids, cecal AVM, dysphagia, GERD, HLD, migraine, anemia, COPD, HTN, TIA, renal artery stenosis, PVD, HLD who presented after having difficulty swallowing food.  She had been eating and developed significant dysphagia due to food impaction. He was taken for urgent EGD on admission and found to have no food bolus noted and only mild esophagitis otherwise no other significant findings.  She was recommended for soft diet and Protonix.  On further workup she was found to have worsened anemia with hemoglobin down to 7.6 g/dL on admission with further dilutional downtrend after some fluids to 7.1 g/dL.  Baseline hemoglobin appeared to be around 8 to 11 g/dL.  Iron stores were also checked and low.  She was treated with dose of Ferrlecit and also given 1 unit PRBC.  She will follow-up outpatient with GI to discuss any further need for repeat colonoscopy.  Last colonoscopy was performed 2017 showing sigmoid diverticulosis, internal hemorrhoids, cecal AVMs.

## 2024-01-30 NOTE — H&P (Signed)
 History and Physical    PRESCIOUS HURLESS ZOX:[id number] DOB: Sep 08, 1932 DOA: 01/29/2024  PCP: Adrian Prince, MD   Patient coming from: Home   Chief Complaint:  Chief Complaint  Patient presents with   Dysphagia   ED TRIAGE note:  HPI:  Jessica [name] is a 89 y.o. female with medical history significant of chronic dysphagia, GERD, hyperlipidemia, chronic migraine, chronic iron deficiency anemia, COPD, essential hypertension, TIA, renal artery stenosis, peripheral vascular disease and hyperlipidemia presented to emergency department complaining of food impaction into her lower part of esophagus.  Patient reported finally she thinks she was able to finally pass it down but since this incidence patient is unable to swallow anything even including water. Patient complaining about persistent dysphagia and having oral secretion. GI has been consulted and patient has been immediately taken to the OR for immediate endoscopy performed by Dr. Lavon Paganini. Endoscopic findings normal esophagus without any bleeding, normal stomach, normal duodenum and no specimen collected.  Per gastroenterology recommendation patient will need mechanical soft diet, start oral Protonix 20 mg daily and follow-up and antireflux regimen.   Presentation to ED presentation to ED patient found to be bradycardic heart rate in between 50-57, borderline hypertensive, O2 sat dropped to 89% room air currently on 98% on 3 L oxygen. CBC showing low hemoglobin 8.2, low hematocrit 32, low MCV 72.  Baseline hemoglobin around 11-18 2 years ago. BMP unremarkable. FOBT negative. Low ferritin for and pending iron and TIBC level checked.    Significant labs in the ED: Lab Orders         Basic metabolic panel         CBC         Occult blood card to lab, stool         Iron and TIBC         Ferritin (Iron Binding Protein)         Differential         CBC         Comprehensive metabolic panel       Review of Systems:  Review of  Systems  Constitutional:  Negative for chills, fever, malaise/fatigue and weight loss.  Respiratory:  Positive for cough. Negative for sputum production, shortness of breath and wheezing.   Cardiovascular:  Negative for chest pain and palpitations.  Gastrointestinal:  Negative for abdominal pain, blood in stool, heartburn, nausea and vomiting.  Neurological:  Negative for dizziness and headaches.  Endo/Heme/Allergies:  Does not bruise/bleed easily.  Psychiatric/Behavioral:  The patient is not nervous/anxious.     Past Medical History:  Diagnosis Date   Anemia    Arthritis    "thumbs" (06/27/2015)   Bilateral renal artery stenosis (HCC)    Cataract    Chronic bronchitis (HCC)    Chronic lower back pain    COPD (chronic obstructive pulmonary disease) (HCC)    GERD (gastroesophageal reflux disease)    Headache    "probably weekly" (06/27/2015)   Hypertension    Hypertriglyceridemia    Migraine    "maybe monthly" (06/27/2015)   Pneumonia     Past Surgical History:  Procedure Laterality Date   BALLOON DILATION  1980's?   "for renal stenosis"   CATARACT EXTRACTION, BILATERAL     ENDARTERECTOMY FEMORAL Right 05/15/2020   Procedure: Right Common Femoral Endarterectomy;  Surgeon: Cephus Shelling, MD;  Location: [facility] OR;  Service: Vascular;  Laterality: Right;   FEMORAL ARTERY EXPLORATION Left 05/20/2021   Procedure: LEFT LEG  THROMBECTOMY WITH LEFT FEMORAL  ENDARTERECTOMY AND PATCH ANGIOPLASTY;  Surgeon: Larina Earthly, MD;  Location: [facility] OR;  Service: Vascular;  Laterality: Left;   PATCH ANGIOPLASTY Right 05/15/2020   Procedure: Common Femoral Artery Patch Angioplasty using XenoSure Bovine Pericardial Patch;  Surgeon: Cephus Shelling, MD;  Location: [facility] OR;  Service: Vascular;  Laterality: Right;   PERIPHERAL VASCULAR BALLOON ANGIOPLASTY  05/15/2021   Procedure: PERIPHERAL VASCULAR BALLOON ANGIOPLASTY;  Surgeon: Cephus Shelling, MD;  Location: [facility] INVASIVE CV LAB;  Service:  Cardiovascular;;   Celiac   PERIPHERAL VASCULAR INTERVENTION  05/15/2020   Procedure: PERIPHERAL VASCULAR INTERVENTION;  Surgeon: Cephus Shelling, MD;  Location: [facility] INVASIVE CV LAB;  Service: Cardiovascular;;  Celiac   RECONSTRUCTION OF EYELID Bilateral 01/2016   Upper and lower eyelid   RENAL ANGIOPLASTY     TOE SURGERY Bilateral    "straightened big toe"   TONSILLECTOMY  ~ 1941   VAGINAL HYSTERECTOMY  1973   VISCERAL ANGIOGRAPHY N/A 05/15/2020   Procedure: MESENTERIC  ANGIOGRAPHY;  Surgeon: Cephus Shelling, MD;  Location: [facility] INVASIVE CV LAB;  Service: Cardiovascular;  Laterality: N/A;   VISCERAL ANGIOGRAPHY N/A 05/15/2021   Procedure: MESENTRIC ANGIOGRAPHY;  Surgeon: Cephus Shelling, MD;  Location: [facility] INVAS[facility] LAB;  Service: Cardiovascular;  Laterality: N/A;     reports that she has quit smoking. Her smoking use included cigarettes. She has a 10 pack-year smoking history. She has never used smokeless tobacco. She reports that she does not currently use alcohol. She reports that she does not use drugs.  No Known Allergies  Family History  Problem Relation Age of Onset   Parkinsonism Father    Cancer Brother        Bladder   Hypertension Brother    Hyperlipidemia Brother    Stroke Maternal Grandfather    Colon cancer Neg Hx    Colon polyps Neg Hx    Stomach cancer Neg Hx     Prior to Admission medications   Medication Sig Start Date End Date Taking? Authorizing Provider  albuterol (PROVENTIL) (2.5 MG/3ML) 0.083% nebulizer solution INHALE 3 ML BY NEBULIZATION EVERY 6 HOURS AS NEEDED FOR WHEEZING OR SHORTNESS OF BREATH 04/01/23  Yes Martina Sinner, MD  albuterol (VENTOLIN HFA) 108 (90 Base) MCG/ACT inhaler TAKE 2 PUFFS BY MOUTH EVERY 6 HOURS AS NEEDED FOR WHEEZE OR SHORTNESS OF BREATH 10/22/23  Yes Martina Sinner, MD  amLODipine (NORVASC) 5 MG tablet Take 5 mg by mouth daily.   Yes [provider]  aspirin EC 81 MG tablet 1 tablet Orally Once a day    Yes [provider]  atorvastatin (LIPITOR) 20 MG tablet TAKE 1 TABLET BY MOUTH EVERY DAY 03/29/23  Yes Maeola Harman, MD  clopidogrel (PLAVIX) 75 MG tablet Take 1 tablet by mouth daily.   Yes [provider]  gabapentin (NEURONTIN) 100 MG capsule Take 100 mg by mouth at bedtime. 04/30/20  Yes [provider]  HYDROcodone-acetaminophen (NORCO) 10-325 MG tablet Take 1 tablet by mouth 3 (three) times daily as needed (pain).   Yes [provider]  ipratropium (ATROVENT) 0.03 % nasal spray PLACE 2 SPRAYS INTO BOTH NOSTRILS 2 (TWO) TIMES DAILY AS NEEDED FOR RHINITIS. 02/12/23  Yes Martina Sinner, MD  losartan (COZAAR) 100 MG tablet Take 100 mg by mouth every evening. 12/12/18  Yes [provider]  mirtazapine (REMERON) 15 MG tablet Take 15 mg by mouth at bedtime.   Yes [provider]  montelukast (SINGULAIR) 10 MG tablet Take 10 mg by mouth at bedtime.  04/28/16  Yes [provider]  Multiple Vitamins-Minerals (MULTIVITAMIN PO) Take 1 tablet by mouth daily.    Yes [provider]  Multiple Vitamins-Minerals (PRESERVISION AREDS 2) CAPS Take 1 capsule by mouth 2 (two) times daily.   Yes [provider]  nystatin (MYCOSTATIN) 100000 UNIT/ML suspension Take 5 mLs (500,000 Units total) by mouth 4 (four) times daily. 01/26/24  Yes Glenford Bayley, NP  predniSONE (DELTASONE) 5 MG tablet Take 4 tablets (20mg ) daily x 5 days; then 2 tablets (10mg ) daily x 5 days; then stay on 1 tablet (5mg ) daily until follow-up 01/26/24  Yes Glenford Bayley, NP  propranolol ER (INDERAL LA) 120 MG 24 hr capsule Take 120 mg by mouth daily. 04/08/15  Yes Adrian Prince, MD  Harrel Carina Salomon Mast 200-62.5-25 MCG/ACT AEPB INHALE 1 PUFF BY MOUTH EVERY DAY 10/15/23  Yes Martina Sinner, MD  doxycycline (VIBRA-TABS) 100 MG tablet Take 1 tablet (100 mg total) by mouth 2 (two) times daily. 01/09/24   Elson Areas, PA-C  estradiol (ESTRACE VAGINAL)  0.1 MG/GM vaginal cream Place 1 g vaginally 2 (two) times a week. 07/02/22   Olivia Mackie, NP  omeprazole (PRILOSEC) 40 MG capsule omeprazole 40 mg capsule,delayed release   40 mg twice a day by oral route. 03/22/20   [provider]     Physical Exam: Vitals:   01/30/24 0045 01/30/24 0100 01/30/24 0225 01/30/24 0226  BP: (!) 132/44 (!) 131/42 (!) 159/55   Pulse: (!) 51 (!) 52    Resp: 14 12 13    Temp:    98.6 F (37 C)  TempSrc:    Oral  SpO2: 92% 98%    Weight:      Height:        Physical Exam Vitals and nursing note reviewed.  Constitutional:      Appearance: Normal appearance.  HENT:     Mouth/Throat:     Mouth: Mucous membranes are dry.     Pharynx: No oropharyngeal exudate or posterior oropharyngeal erythema.  Eyes:     Conjunctiva/sclera: Conjunctivae normal.  Cardiovascular:     Rate and Rhythm: Regular rhythm. Bradycardia present.     Pulses: Normal pulses.     Heart sounds: Normal heart sounds.  Pulmonary:     Effort: Pulmonary effort is normal. No respiratory distress.     Breath sounds: Normal breath sounds. No stridor. No wheezing, rhonchi or rales.  Abdominal:     General: There is no distension.     Palpations: Abdomen is soft.     Tenderness: There is no abdominal tenderness.  Musculoskeletal:     Cervical back: Neck supple.  Skin:    General: Skin is warm.     Capillary Refill: Capillary refill takes less than 2 seconds.     Findings: No rash.  Neurological:     Mental Status: She is alert and oriented to person, place, and time.      Labs on Admission: I have personally reviewed following labs and imaging studies  CBC: Recent Labs  Lab 01/26/24 0949 01/29/24 2002  WBC 10.7 6.9  NEUTROABS 7,137 5.2  HGB 8.2* 7.6*  HCT 32.8* 30.5*  MCV 72.9* 76.3*  PLT 281 195   Basic Metabolic Panel: Recent Labs  Lab 01/29/24 2002  NA 140  K 4.0  CL 106  CO2 27  GLUCOSE 149*  BUN 20  CREATININE 0.61  CALCIUM 8.6*    GFR: Estimated Creatinine Clearance: 29.1 mL/min (by C-G formula based on SCr of 0.61 mg/dL). Liver Function Tests: No results for input(s): "AST", "ALT", "ALKPHOS", "BILITOT", "PROT", "ALBUMIN" in the last 168 hours. No results for input(s): "LIPASE", "AMYLASE" in the last 168 hours. No results for input(s): "AMMONIA" in the last 168 hours. Coagulation Profile: No results for input(s): "INR", "PROTIME" in the last 168 hours. Cardiac Enzymes: No results for input(s): "CKTOTAL", "CKMB", "CKMBINDEX", "TROPONINI", "TROPONINIHS" in the last 168 hours. BNP (last 3 results) No results for input(s): "BNP" in the last 8760 hours. HbA1C: No results for input(s): "HGBA1C" in the last 72 hours. CBG: No results for input(s): "GLUCAP" in the last 168 hours. Lipid Profile: No results for input(s): "CHOL", "HDL", "LDLCALC", "TRIG", "CHOLHDL", "LDLDIRECT" in the last 72 hours. Thyroid Function Tests: No results for input(s): "TSH", "T4TOTAL", "FREET4", "T3FREE", "THYROIDAB" in the last 72 hours. Anemia Panel: Recent Labs    01/29/24 2101  FERRITIN 4*   Urine analysis:    Component Value Date/Time   COLORURINE YELLOW 05/20/2021 1042   APPEARANCEUR CLEAR 05/20/2021 1042   LABSPEC 1.011 05/20/2021 1042   PHURINE 7.0 05/20/2021 1042   GLUCOSEU NEGATIVE 05/20/2021 1042   HGBUR NEGATIVE 05/20/2021 1042   BILIRUBINUR NEGATIVE 05/20/2021 1042   BILIRUBINUR N 05/18/2016 1051   KETONESUR NEGATIVE 05/20/2021 1042   PROTEINUR 30 (A) 05/20/2021 1042   UROBILINOGEN negative 05/18/2016 1051   NITRITE NEGATIVE 05/20/2021 1042   LEUKOCYTESUR NEGATIVE 05/20/2021 1042    Radiological Exams on Admission: I have personally reviewed images DG Chest Portable 1 View Result Date: 01/29/2024 CLINICAL DATA:  dysphagia, choking, 2L o2 requirement EXAM: PORTABLE CHEST - 1 VIEW COMPARISON:  01/09/2024 FINDINGS: Cardiomediastinal silhouette and pulmonary vasculature are within normal limits. Lungs are  hyperexpanded, but otherwise clear. IMPRESSION: No acute cardiopulmonary process. Electronically Signed   By: Acquanetta Belling M.D.   On: 01/29/2024 21:18     EKG: My personal interpretation of EKG shows:   Assessment/Plan: Principal Problem:   Food impaction of esophagus s/p endoscopy normal findings Active Problems:   History of dysphagia   Essential hypertension   Hyperlipidemia   Renal artery stenosis (HCC)   GERD (gastroesophageal reflux disease)   Acute on chronic anemia   PVD (peripheral vascular disease) (HCC)   History of COPD   History of TIA (transient ischemic attack)    Assessment and Plan: Food impaction to esophagus status post endoscopy 01/29/2024 (normal findings) -[facility] for emergent endoscopy tonight secondary to having food impaction of the esophagus.  Around 5:30 PM patient was eating some steak and it was stuck to her lower part of esophagus eventually she was able to swallow it but since then patient was was experiencing unable to swallow any solid and liquid food. - At presentation to ED patient found to have O2 sat 89% room air and bradycardic otherwise hemodynamically stable. - CBC show evidence of acute on chronic iron deficiency anemia.  BMP unremarkable. - Patient was taken to the OR for endoscopy by by GI last night 3/1. - Normal EGD finding-esophagus with no bleeding, normal stomach, normal duodenum and no specimen was collected.  Gastroenterologist recommended mechanical soft diet, use Protonix 20 mg p.o. daily. - Admit patient for observation overnight. - Per chart review patient has history of chronic dysphagia and given she has history of COPD IgE has been checked which is within normal range so  there is no concern for eosinophilic esophagitis as well. - Plan to continue mechanical soft diet, Protonix 20 mg daily.  If patient remains stable without any food impaction episode can discharge to home  tomorrow. -Instructed and informed patient at the bedside that from ongoing she has to eat mechanical soft food.     Acute on chronic iron deficiency anemia - FOBT negative -Baseline hemoglobin around 11-14 which was last checked 2 years ago.  CBC today showing hemoglobin 8.2, low hematocrit 32, low MCV 72.  Low ferritin level 4.  Pending iron and TIBC level. - Giving Ferrlecit infusion to 50 mg. -Will start oral iron supplement afterward.  Essential hypertension -Bradycardic heart rate in between 51-52 likely secondary to parasympathetic tone from esophageal food impaction.  Elevated blood pressure up to 167/56. -Holding nadolol given patient is bradycardic.  Continue other home blood pressure regimen include amlodipine 5 mg daily and losartan 100 mg daily.  Starting hydralazine as needed. -Continue cardiac monitoring  Hyperlipidemia -Continue Lipitor.  Renal artery stenosis Peripheral vascular disease - Continue Lipitor, aspirin and Plavix.  History of TIA - Continue aspirin and Plavix.  GERD -Continue Protonix 20 mg daily   History of COPD-stable - Patient's O2 sat dropped 89% on room air .Marland KitchenCurrently maintaining O2 sat 98 to 100% on 2 L oxygen.  Continue Incruse Ellipta and Breo Ellipta once daily.  Continue DuoNeb as needed.   DVT prophylaxis:  SCDs.  Deferring pharmacological DVT prophylaxis in the setting of low hemoglobin. Code Status:  Full Code Diet: Mechanical soft diet. Family Communication:   Family was present at bedside, at the time of interview. Opportunity was given to ask question and all questions were answered satisfactorily.  Disposition Plan: If patient does not develop any recurrent food impaction and able to swallow food without any complication can discharge home tomorrow. Consults: Gastroenterology Admission status:   Observation, Telemetry bed  Severity of Illness: The appropriate patient status for this patient is OBSERVATION. Observation status is  judged to be reasonable and necessary in order to provide the required intensity of service to ensure the patient's safety. The patient's presenting symptoms, physical exam findings, and initial radiographic and laboratory data in the context of their medical condition is felt to place them at decreased risk for further clinical deterioration. Furthermore, it is anticipated that the patient will be medically stable for discharge from the hospital within 2 midnights of admission.     Tereasa Coop, MD [facility]  How to contact the [facility] Attending or Consulting provider 7A - 7P or covering provider during after hours 7P -7A, for this patient.  Check the care team in [facility] and look for a) attending/consulting [facility] provider listed and b) the [facility] team listed Log into [email or web address] and use [facility] universal password to access. If you do not have the password, please contact the hospital operator. Locate the [facility] provider you are looking for under [facility] and page to a number that you can be directly reached. If you still have difficulty reaching the provider, please page the [facility] (Director on Call) for the Hospitalists listed on amion for assistance.  01/30/2024, 2:41 AM

## 2024-01-30 NOTE — Anesthesia Postprocedure Evaluation (Signed)
 Anesthesia Post Note  Patient: INIOLUWA BOULAY  Procedure(s) Performed: ESOPHAGOGASTRODUODENOSCOPY (EGD)     Patient location during evaluation: PACU Anesthesia Type: General Level of consciousness: awake and alert Pain management: pain level controlled Vital Signs Assessment: post-procedure vital signs reviewed and stable Respiratory status: spontaneous breathing, nonlabored ventilation, respiratory function stable and patient connected to nasal cannula oxygen Cardiovascular status: blood pressure returned to baseline and stable Postop Assessment: no apparent nausea or vomiting Anesthetic complications: no  No notable events documented.  Last Vitals:  Vitals:   01/30/24 0015 01/30/24 0030  BP: (!) 128/49 (!) 134/51  Pulse: 65 (!) 55  Resp: 20 11  Temp:    SpO2: 95% 99%    Last Pain:  Vitals:   01/30/24 0015  TempSrc:   PainSc: Asleep                 Kennieth Rad

## 2024-01-30 NOTE — Progress Notes (Signed)
 Patient transferred from PACU to ED room 23. Report given to [facility] RN

## 2024-01-30 NOTE — Transfer of Care (Signed)
 Immediate Anesthesia Transfer of Care Note  Patient: Jessica [name]  Procedure(s) Performed: ESOPHAGOGASTRODUODENOSCOPY (EGD)  Patient Location: PACU  Anesthesia Type:General  Level of Consciousness: awake, alert , and oriented  Airway & Oxygen Therapy: Patient Spontanous Breathing and Patient connected to nasal cannula oxygen  Post-op Assessment: Report given to RN, Post -op Vital signs reviewed and stable, and Patient moving all extremities  Post vital signs: Reviewed and stable  Last Vitals:  Vitals Value Taken Time  BP 119/48 01/30/24 0000  Temp 36.6 C 01/30/24 0000  Pulse 69 01/30/24 0005  Resp 21 01/30/24 0005  SpO2 97 % 01/30/24 0005  Vitals shown include unfiled device data.  Last Pain:  Vitals:   01/29/24 2311  TempSrc:   PainSc: 0-No pain         Complications: No notable events documented.

## 2024-01-30 NOTE — Evaluation (Signed)
 Physical Therapy Evaluation Patient Details Name: Jessica [name] MRN: [id number] DOB: 1932-03-21 Today's Date: 01/30/2024  History of Present Illness  The pt is an 89 yo female presenting 3/1 with dysphagia after a piece of burger got stuck in her throat. S/p upper endoscopy 3/1. PMH includes: anemia, arthritis, low back pain, COPD GERD, HTN, and bilateral cataracts surgery.   Clinical Impression  Pt in bed upon arrival of PT, agreeable to evaluation at this time. Prior to admission the pt was independent with mobility, but reports poor endurance and recently purchased RW to help with longer-distance ambulation. Pt denies falls, lives alone with assistance for some IADLs. The pt was able to complete bed mobility without assistance and completed multiple bouts of ambulation in her room and hallway without DME and then with use of RW. She demos improved speed and stability with use of RW, and improvement in SpO2 with addition of 2L O2. (See specific values below). She will be able to return home with intermittent assist and HHPT, will continue to benefit from skilled PT to progress functional strength and endurance.   SpO2 on RA at rest: 93% SpO2 on RA with 15 ft ambulation: 88% SpO2 on 2L at rest: 97% SpO2 on 2L with 30 ft ambulation: 95%       If plan is discharge home, recommend the following: Assist for transportation;Assistance with cooking/housework   Can travel by private vehicle        Equipment Recommendations None recommended by PT (pt has needed DME)  Recommendations for Other Services       Functional Status Assessment Patient has had a recent decline in their functional status and demonstrates the ability to make significant improvements in function in a reasonable and predictable amount of time.     Precautions / Restrictions Precautions Precautions: Fall Recall of Precautions/Restrictions: Intact Restrictions Weight Bearing Restrictions Per Provider Order: No       Mobility  Bed Mobility Overal bed mobility: Needs Assistance Bed Mobility: Supine to Sit     Supine to sit: Supervision     General bed mobility comments: increased time, no assist    Transfers Overall transfer level: Needs assistance Equipment used: Rolling walker (2 wheels), None Transfers: Sit to/from Stand, Bed to chair/wheelchair/BSC Sit to Stand: Supervision, Contact guard assist   Step pivot transfers: Supervision, Contact guard assist       General transfer comment: no DME initially, pt needing CGA, supervision with use of RW    Ambulation/Gait Ambulation/Gait assistance: Contact guard assist, Supervision Gait Distance (Feet): 15 Feet (+ 15 + 35 + 26ft) Assistive device: None, Rolling walker (2 wheels) Gait Pattern/deviations: Step-through pattern, Decreased stride length, Wide base of support Gait velocity: decreased Gait velocity interpretation: <1.31 ft/sec, indicative of household ambulator   General Gait Details: improved stability with RW, SpO2 to 88% on RA, 95-97% on 2L      Balance Overall balance assessment: Needs assistance Sitting-balance support: No upper extremity supported, Feet supported Sitting balance-Leahy Scale: Good     Standing balance support: Bilateral upper extremity supported, During functional activity Standing balance-Leahy Scale: Fair Standing balance comment: can ambulate without UE support, improved balance wiht BUE support. no overt buckling                             Pertinent Vitals/Pain Pain Assessment Pain Assessment: No/denies pain    Home Living Family/patient expects to be discharged to:: Private residence Living Arrangements:  Alone Available Help at Discharge: Friend(s);Available PRN/intermittently Type of Home: House Home Access: Stairs to enter Entrance Stairs-Rails: None Entrance Stairs-Number of Steps: 1   Home Layout: One level Home Equipment: Agricultural consultant (2 wheels)      Prior  Function Prior Level of Function : Independent/Modified Independent             Mobility Comments: no use of DME, poor endurance, no exercise but moves around the house during the day ADLs Comments: assist with cleaning and errands, pt reports independent with ADLs and cooking     Extremity/Trunk Assessment   Upper Extremity Assessment Upper Extremity Assessment: Generalized weakness    Lower Extremity Assessment Lower Extremity Assessment: Generalized weakness    Cervical / Trunk Assessment Cervical / Trunk Assessment: Kyphotic;Other exceptions Cervical / Trunk Exceptions: frail  Communication   Communication Communication: No apparent difficulties    Cognition Arousal: Alert Behavior During Therapy: Flat affect   PT - Cognitive impairments: No apparent impairments, No family/caregiver present to determine baseline                         Following commands: Intact       Cueing Cueing Techniques: Verbal cues     General Comments General comments (skin integrity, edema, etc.): SpO2 to 88% on RA, 93%  at rest on RA, 95-97% on 2L        Assessment/Plan    PT Assessment Patient needs continued PT services  PT Problem List Decreased activity tolerance;Decreased strength;Decreased balance;Decreased mobility;Cardiopulmonary status limiting activity       PT Treatment Interventions DME instruction;Gait training;Functional mobility training;Therapeutic activities;Therapeutic exercise;Balance training;Patient/family education    PT Goals (Current goals can be found in the Care Plan section)  Acute Rehab PT Goals Patient Stated Goal: improve breathing and return home PT Goal Formulation: With patient Time For Goal Achievement: 02/13/24 Potential to Achieve Goals: Good    Frequency Min 1X/week        AM-PAC PT "6 Clicks" Mobility  Outcome Measure Help needed turning from your back to your side while in a flat bed without using bedrails?: None Help  needed moving from lying on your back to sitting on the side of a flat bed without using bedrails?: None Help needed moving to and from a bed to a chair (including a wheelchair)?: A Little Help needed standing up from a chair using your arms (e.g., wheelchair or bedside chair)?: A Little Help needed to walk in hospital room?: A Little Help needed climbing 3-5 steps with a railing? : A Little 6 Click Score: 20    End of Session Equipment Utilized During Treatment: Gait belt;Oxygen Activity Tolerance: Patient limited by fatigue Patient left: in chair;with call bell/phone within reach;with chair alarm set Nurse Communication: Mobility status;Other (comment) (O2) PT Visit Diagnosis: Unsteadiness on feet (R26.81);Muscle weakness (generalized) (M62.81)    Time: 4098-1191 PT Time Calculation (min) (ACUTE ONLY): 48 min   Charges:   PT Evaluation $PT Eval Moderate Complexity: 1 Mod PT Treatments $Gait Training: 8-22 mins $Therapeutic Activity: 8-22 mins PT General Charges $$ ACUTE PT VISIT: 1 Visit         Vickki Muff, PT, DPT   Acute Rehabilitation Department Office [phone] Secure Chat Communication Preferred  Ronnie Derby 01/30/2024, 6:18 PM

## 2024-01-30 NOTE — Discharge Summary (Signed)
 Physician Discharge Summary   Jessica [name] ZOX:[id number] DOB: 1932-09-06 DOA: 01/29/2024  PCP: Adrian Prince, MD  Admit date: 01/29/2024 Discharge date: 01/30/2024  Admitted From: Home Disposition:  Home Discharging physician: Lewie Chamber, MD Barriers to discharge: none  Recommendations at discharge: Follow up with GI  Repeat iron studies in ~3 months   Discharge Condition: stable CODE STATUS: Full  Diet recommendation:  Diet Orders (From admission, onward)     Start     Ordered   01/29/24 2356  DIET DYS 3 Room service appropriate? Yes; Fluid consistency: Thin  Diet effective now       Question Answer Comment  Room service appropriate? Yes   Fluid consistency: Thin      01/29/24 2356            Hospital Course: Ms. Jessica [name] is an 89 yo female with PMH diverticulosis, hemorrhoids, cecal AVM, dysphagia, GERD, HLD, migraine, anemia, COPD, HTN, TIA, renal artery stenosis, PVD, HLD who presented after having difficulty swallowing food.  She had been eating and developed significant dysphagia due to food impaction. He was taken for urgent EGD on admission and found to have no food bolus noted and only mild esophagitis otherwise no other significant findings.  She was recommended for soft diet and Protonix.  On further workup she was found to have worsened anemia with hemoglobin down to 7.6 g/dL on admission with further dilutional downtrend after some fluids to 7.1 g/dL.  Baseline hemoglobin appeared to be around 8 to 11 g/dL.  Iron stores were also checked and low.  She was treated with dose of Ferrlecit and also given 1 unit PRBC.  She will follow-up outpatient with GI to discuss any further need for repeat colonoscopy.  Last colonoscopy was performed 2017 showing sigmoid diverticulosis, internal hemorrhoids, cecal AVMs.   The patient's acute and chronic medical conditions were treated accordingly. On day of discharge, patient was felt deemed stable for discharge. Patient/family  member advised to call PCP or come back to ER if needed.   Principal Diagnosis: Food impaction of esophagus  Discharge Diagnoses: Active Hospital Problems   Diagnosis Date Noted   Acute on chronic anemia 07/15/2020    Priority: 2.   Iron deficiency anemia 01/30/2024    Priority: 3.   History of arteriovenous malformation (AVM) 01/30/2024    Priority: 3.   Diverticulosis 01/30/2024    Priority: 4.   History of dysphagia 01/30/2024    Priority: 5.   Internal hemorrhoids 01/30/2024    Priority: 5.   History of COPD 01/30/2024   History of TIA (transient ischemic attack) 01/30/2024   PVD (peripheral vascular disease) (HCC) 07/15/2020   GERD (gastroesophageal reflux disease) 05/03/2018   Hyperlipidemia 06/28/2015   Renal artery stenosis (HCC) 06/28/2015   Essential hypertension 02/06/2013    Resolved Hospital Problems   Diagnosis Date Noted Date Resolved   Food impaction of esophagus s/p endoscopy normal findings 01/30/2024 01/30/2024    Priority: 1.     Discharge Instructions     Increase activity slowly   Complete by: As directed       Allergies as of 01/30/2024   No Known Allergies      Medication List     TAKE these medications    albuterol (2.5 MG/3ML) 0.083% nebulizer solution Commonly known as: PROVENTIL INHALE 3 ML BY NEBULIZATION EVERY 6 HOURS AS NEEDED FOR WHEEZING OR SHORTNESS OF BREATH   albuterol 108 (90 Base) MCG/ACT inhaler Commonly known as: VENTOLIN HFA TAKE  2 PUFFS BY MOUTH EVERY 6 HOURS AS NEEDED FOR WHEEZE OR SHORTNESS OF BREATH   amLODipine 5 MG tablet Commonly known as: NORVASC Take 5 mg by mouth daily.   aspirin EC 81 MG tablet 1 tablet Orally Once a day   atorvastatin 20 MG tablet Commonly known as: LIPITOR TAKE 1 TABLET BY MOUTH EVERY DAY   clopidogrel 75 MG tablet Commonly known as: PLAVIX Take 1 tablet by mouth daily.   ferrous sulfate 325 (65 FE) MG tablet Take 1 tablet (325 mg total) by mouth daily with breakfast. Start  taking on: January 31, 2024   gabapentin 100 MG capsule Commonly known as: NEURONTIN Take 100 mg by mouth at bedtime.   HYDROcodone-acetaminophen 7.5-325 MG tablet Commonly known as: NORCO Take 1 tablet by mouth 5 (five) times daily.   ipratropium 0.03 % nasal spray Commonly known as: ATROVENT PLACE 2 SPRAYS INTO BOTH NOSTRILS 2 (TWO) TIMES DAILY AS NEEDED FOR RHINITIS.   losartan 100 MG tablet Commonly known as: COZAAR Take 100 mg by mouth every evening.   mirtazapine 15 MG tablet Commonly known as: REMERON Take 15 mg by mouth at bedtime.   montelukast 10 MG tablet Commonly known as: SINGULAIR Take 10 mg by mouth at bedtime.   MULTIVITAMIN PO Take 1 tablet by mouth daily.   nystatin 100000 UNIT/ML suspension Commonly known as: MYCOSTATIN Take 5 mLs (500,000 Units total) by mouth 4 (four) times daily.   pantoprazole 40 MG tablet Commonly known as: PROTONIX Take 40 mg by mouth daily.   predniSONE 5 MG tablet Commonly known as: DELTASONE Take 4 tablets (20mg ) daily x 5 days; then 2 tablets (10mg ) daily x 5 days; then stay on 1 tablet (5mg ) daily until follow-up   PreserVision AREDS 2 Caps Take 1 capsule by mouth 2 (two) times daily.   propranolol ER 120 MG 24 hr capsule Commonly known as: INDERAL LA Take 120 mg by mouth daily.   Trelegy Ellipta 200-62.5-25 MCG/ACT Aepb Generic drug: Fluticasone-Umeclidin-Vilant INHALE 1 PUFF BY MOUTH EVERY DAY        No Known Allergies  Consultations: GI  Procedures: 3/1: EGD  Discharge Exam: BP (!) 138/40   Pulse 72   Temp 97.6 F (36.4 C)   Resp (!) 22   Ht 5\' 2"  (1.575 m)   Wt 38.6 kg   LMP 11/30/1976   SpO2 (!) 87%   BMI 15.55 kg/m  Physical Exam Constitutional:      Appearance: Normal appearance.  HENT:     Head: Normocephalic and atraumatic.     Mouth/Throat:     Mouth: Mucous membranes are moist.  Eyes:     Extraocular Movements: Extraocular movements intact.  Cardiovascular:     Rate and Rhythm:  Normal rate and regular rhythm.  Pulmonary:     Effort: Pulmonary effort is normal. No respiratory distress.     Breath sounds: Normal breath sounds. No wheezing.  Abdominal:     General: Bowel sounds are normal. There is no distension.     Palpations: Abdomen is soft.     Tenderness: There is no abdominal tenderness.  Musculoskeletal:        General: Normal range of motion.     Cervical back: Normal range of motion and neck supple.  Skin:    General: Skin is warm and dry.  Neurological:     General: No focal deficit present.     Mental Status: She is alert.  Psychiatric:  Mood and Affect: Mood normal.      The results of significant diagnostics from this hospitalization (including imaging, microbiology, ancillary and laboratory) are listed below for reference.   Microbiology: No results found for this or any previous visit (from the past 240 hours).   Labs: BNP (last 3 results) No results for input(s): "BNP" in the last 8760 hours. Basic Metabolic Panel: Recent Labs  Lab 01/29/24 2002 01/30/24 0323  NA 140 140  K 4.0 4.2  CL 106 105  CO2 27 26  GLUCOSE 149* 118*  BUN 20 18  CREATININE 0.61 0.52  CALCIUM 8.6* 8.3*   Liver Function Tests: Recent Labs  Lab 01/30/24 0323  AST 16  ALT 21  ALKPHOS 56  BILITOT 0.6  PROT 5.2*  ALBUMIN 2.8*   No results for input(s): "LIPASE", "AMYLASE" in the last 168 hours. No results for input(s): "AMMONIA" in the last 168 hours. CBC: Recent Labs  Lab 01/26/24 0949 01/29/24 2002 01/30/24 0323  WBC 10.7 6.9 9.2  NEUTROABS 7,137 5.2  --   HGB 8.2* 7.6* 7.1*  HCT 32.8* 30.5* 28.6*  MCV 72.9* 76.3* 78.8*  PLT 281 195 149*   Cardiac Enzymes: No results for input(s): "CKTOTAL", "CKMB", "CKMBINDEX", "TROPONINI" in the last 168 hours. BNP: Invalid input(s): "POCBNP" CBG: No results for input(s): "GLUCAP" in the last 168 hours. D-Dimer No results for input(s): "DDIMER" in the last 72 hours. Hgb A1c No results for  input(s): "HGBA1C" in the last 72 hours. Lipid Profile No results for input(s): "CHOL", "HDL", "LDLCALC", "TRIG", "CHOLHDL", "LDLDIRECT" in the last 72 hours. Thyroid function studies No results for input(s): "TSH", "T4TOTAL", "T3FREE", "THYROIDAB" in the last 72 hours.  Invalid input(s): "FREET3" Anemia work up Recent Labs    01/29/24 2101  FERRITIN 4*   Urinalysis    Component Value Date/Time   COLORURINE YELLOW 05/20/2021 1042   APPEARANCEUR CLEAR 05/20/2021 1042   LABSPEC 1.011 05/20/2021 1042   PHURINE 7.0 05/20/2021 1042   GLUCOSEU NEGATIVE 05/20/2021 1042   HGBUR NEGATIVE 05/20/2021 1042   BILIRUBINUR NEGATIVE 05/20/2021 1042   BILIRUBINUR N 05/18/2016 1051   KETONESUR NEGATIVE 05/20/2021 1042   PROTEINUR 30 (A) 05/20/2021 1042   UROBILINOGEN negative 05/18/2016 1051   NITRITE NEGATIVE 05/20/2021 1042   LEUKOCYTESUR NEGATIVE 05/20/2021 1042   Sepsis Labs Recent Labs  Lab 01/26/24 0949 01/29/24 2002 01/30/24 0323  WBC 10.7 6.9 9.2   Microbiology No results found for this or any previous visit (from the past 240 hours).  Procedures/Studies: DG Chest Portable 1 View Result Date: 01/29/2024 CLINICAL DATA:  dysphagia, choking, 2L o2 requirement EXAM: PORTABLE CHEST - 1 VIEW COMPARISON:  01/09/2024 FINDINGS: Cardiomediastinal silhouette and pulmonary vasculature are within normal limits. Lungs are hyperexpanded, but otherwise clear. IMPRESSION: No acute cardiopulmonary process. Electronically Signed   By: Acquanetta Belling M.D.   On: 01/29/2024 21:18   DG Chest 2 View Result Date: 01/09/2024 CLINICAL DATA:  Cough and shortness of breath for several days. Hypoxia. EXAM: CHEST - 2 VIEW COMPARISON:  07/24/2020 FINDINGS: The heart size and mediastinal contours are within normal limits. Marked pulmonary hyperinflation again seen, consistent with emphysema. Both lungs are clear. The visualized skeletal structures are unremarkable. IMPRESSION: Emphysema. No active cardiopulmonary  disease. Electronically Signed   By: Danae Orleans M.D.   On: 01/09/2024 13:51     Time coordinating discharge: Over 30 minutes    Lewie Chamber, MD  [facility] 01/30/2024, 1:21 PM

## 2024-01-31 ENCOUNTER — Telehealth: Payer: Self-pay

## 2024-01-31 ENCOUNTER — Other Ambulatory Visit (HOSPITAL_COMMUNITY): Payer: Self-pay

## 2024-01-31 ENCOUNTER — Encounter (HOSPITAL_COMMUNITY): Payer: Self-pay | Admitting: Gastroenterology

## 2024-01-31 ENCOUNTER — Other Ambulatory Visit: Payer: Self-pay

## 2024-01-31 DIAGNOSIS — T18128A Food in esophagus causing other injury, initial encounter: Secondary | ICD-10-CM | POA: Diagnosis not present

## 2024-01-31 DIAGNOSIS — J9611 Chronic respiratory failure with hypoxia: Secondary | ICD-10-CM

## 2024-01-31 DIAGNOSIS — W44F3XA Food entering into or through a natural orifice, initial encounter: Secondary | ICD-10-CM | POA: Diagnosis not present

## 2024-01-31 DIAGNOSIS — J9601 Acute respiratory failure with hypoxia: Secondary | ICD-10-CM

## 2024-01-31 DIAGNOSIS — J441 Chronic obstructive pulmonary disease with (acute) exacerbation: Secondary | ICD-10-CM

## 2024-01-31 DIAGNOSIS — D649 Anemia, unspecified: Secondary | ICD-10-CM | POA: Diagnosis not present

## 2024-01-31 LAB — CBC WITH DIFFERENTIAL/PLATELET
Abs Immature Granulocytes: 0.09 10*3/uL — ABNORMAL HIGH (ref 0.00–0.07)
Basophils Absolute: 0 10*3/uL (ref 0.0–0.1)
Basophils Relative: 0 %
Eosinophils Absolute: 0 10*3/uL (ref 0.0–0.5)
Eosinophils Relative: 0 %
HCT: 34.8 % — ABNORMAL LOW (ref 36.0–46.0)
Hemoglobin: 9.8 g/dL — ABNORMAL LOW (ref 12.0–15.0)
Immature Granulocytes: 1 %
Lymphocytes Relative: 18 %
Lymphs Abs: 1.4 10*3/uL (ref 0.7–4.0)
MCH: 21.5 pg — ABNORMAL LOW (ref 26.0–34.0)
MCHC: 28.2 g/dL — ABNORMAL LOW (ref 30.0–36.0)
MCV: 76.5 fL — ABNORMAL LOW (ref 80.0–100.0)
Monocytes Absolute: 0.7 10*3/uL (ref 0.1–1.0)
Monocytes Relative: 9 %
Neutro Abs: 5.5 10*3/uL (ref 1.7–7.7)
Neutrophils Relative %: 72 %
Platelets: 161 10*3/uL (ref 150–400)
RBC: 4.55 MIL/uL (ref 3.87–5.11)
RDW: 19.2 % — ABNORMAL HIGH (ref 11.5–15.5)
WBC: 7.8 10*3/uL (ref 4.0–10.5)
nRBC: 0.5 % — ABNORMAL HIGH (ref 0.0–0.2)

## 2024-01-31 LAB — BPAM RBC
Blood Product Expiration Date: 202503192359
ISSUE DATE / TIME: 202503021247
Unit Type and Rh: 5100

## 2024-01-31 LAB — TYPE AND SCREEN
ABO/RH(D): O POS
Antibody Screen: NEGATIVE
Unit division: 0

## 2024-01-31 MED ORDER — FERROUS SULFATE 300 (60 FE) MG/5ML PO SOLN
220.0000 mg | Freq: Every day | ORAL | Status: DC
  Filled 2024-01-31 (×2): qty 5

## 2024-01-31 MED ORDER — IPRATROPIUM-ALBUTEROL 0.5-2.5 (3) MG/3ML IN SOLN
3.0000 mL | Freq: Two times a day (BID) | RESPIRATORY_TRACT | Status: DC
  Administered 2024-01-31: 3 mL via RESPIRATORY_TRACT
  Filled 2024-01-31: qty 3

## 2024-01-31 MED ORDER — FERROUS SULFATE 75 (15 FE) MG/ML PO SOLN
220.0000 mg | Freq: Every day | ORAL | 3 refills | Status: DC
  Filled 2024-01-31: qty 50, 17d supply, fill #0

## 2024-01-31 MED ORDER — IPRATROPIUM-ALBUTEROL 0.5-2.5 (3) MG/3ML IN SOLN: 3.0000 mL | RESPIRATORY_TRACT | Status: DC | PRN

## 2024-01-31 MED ORDER — FERROUS SULFATE 220 (44 FE) MG/5ML PO SOLN
220.0000 mg | Freq: Every day | ORAL | Status: DC
  Filled 2024-01-31: qty 5

## 2024-01-31 MED ORDER — ALPRAZOLAM 0.25 MG PO TABS
0.2500 mg | ORAL_TABLET | Freq: Every day | ORAL | 0 refills | Status: DC | PRN
  Filled 2024-01-31: qty 20, 20d supply, fill #0

## 2024-01-31 NOTE — Assessment & Plan Note (Signed)
-   Had increased shortness of breath and work of breathing with hypoxia requiring oxygen - Walk test performed prior to discharge and patient to be discharged with oxygen

## 2024-01-31 NOTE — Progress Notes (Signed)
 Mobility Specialist Progress Note:   01/31/24 1152  Mobility  Activity Ambulated with assistance in hallway  Level of Assistance Contact guard assist, steadying assist  Assistive Device Front wheel walker  Distance Ambulated (ft) 120 ft  Activity Response Tolerated well  Mobility Referral Yes  Mobility visit 1 Mobility  Mobility Specialist Start Time (ACUTE ONLY) [id number]  Mobility Specialist Stop Time (ACUTE ONLY) [id number]  Mobility Specialist Time Calculation (min) (ACUTE ONLY) 16 min   Pre Mobility: 73 HR , 82% SpO2 RA During Mobility:79%-93% SpO2 RA- 3 L Post Mobility: 91 HR , 92% SpO2  2 L  Pt received in bed, agreeable to mobility. Desat to 79% on RA with good pleth. Pursed lip breathing encouraged. Pt needing 3 L to keep sats above 89%. Pt denied any SOB but displayed slight DOE towards EOS. Pt left in chair asymptomatic with call bell in reach and all needs met.   Leory Plowman  Mobility Specialist Please contact via [employer] office at [phone]

## 2024-01-31 NOTE — Assessment & Plan Note (Addendum)
-   prevented d/c on 3/2 due to SOB and WOB - started on solumedrol and nebs with good response - continue on outpatient prednisone taper already in place  -Follows with pulmonology outpatient
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
## 2024-01-31 NOTE — Progress Notes (Signed)
 Mobility Specialist Progress Note:   01/31/24 1328  Mobility  Activity Ambulated with assistance to bathroom  Level of Assistance Standby assist, set-up cues, supervision of patient - no hands on  Assistive Device Front wheel walker  Distance Ambulated (ft) 15 ft  Activity Response Tolerated well  Mobility Referral Yes  Mobility visit 1 Mobility  Mobility Specialist Start Time (ACUTE ONLY) 1255  Mobility Specialist Stop Time (ACUTE ONLY) 1305  Mobility Specialist Time Calculation (min) (ACUTE ONLY) 10 min   Post Mobility: 73 HR , 92% SpO2 2 L   Pt received in chair, requesting assistance to BR to urinate. Void successful. Pt displayed heavy DOE when returning to chair with sats in mid 80's. Pursed lip breathing encouraged. After recovery pt returned to chair asymptomatic with call bell in reach and all needs met. VSS.   Leory Plowman  Mobility Specialist Please contact via [employer] office at [phone]

## 2024-01-31 NOTE — Discharge Summary (Signed)
 Physician Discharge Summary   Jessica [name] GNF:[id number] DOB: December 27, 1931 DOA: 01/29/2024  PCP: Adrian Prince, MD  Admit date: 01/29/2024 Discharge date: 01/31/2024  Admitted From: Home Disposition:  Home Discharging physician: Lewie Chamber, MD Barriers to discharge: none  Recommendations at discharge: Follow up with GI Follow up with pulmonology; wean O2 as able   Equipment/Devices: Home O2  Discharge Condition: stable CODE STATUS: Full  Diet recommendation:  Diet Orders (From admission, onward)     Start     Ordered   01/29/24 2356  DIET DYS 3 Room service appropriate? Yes; Fluid consistency: Thin  Diet effective now       Question Answer Comment  Room service appropriate? Yes   Fluid consistency: Thin      01/29/24 2356            Hospital Course: Jessica [name] is an 89 yo female with PMH diverticulosis, hemorrhoids, cecal AVM, dysphagia, GERD, HLD, migraine, anemia, COPD, HTN, TIA, renal artery stenosis, PVD, HLD who presented after having difficulty swallowing food.  She had been eating and developed significant dysphagia due to food impaction. He was taken for urgent EGD on admission and found to have no food bolus noted and only mild esophagitis otherwise no other significant findings.  She was recommended for soft diet and Protonix.  On further workup she was found to have worsened anemia with hemoglobin down to 7.6 g/dL on admission with further dilutional downtrend after some fluids to 7.1 g/dL.  Baseline hemoglobin appeared to be around 8 to 11 g/dL.  Iron stores were also checked and low.  She was treated with dose of Ferrlecit and also given 1 unit PRBC.  She will follow-up outpatient with GI to discuss any further need for repeat colonoscopy.  Last colonoscopy was performed 2017 showing sigmoid diverticulosis, internal hemorrhoids, cecal AVMs.  Assessment and Plan: * Food impaction of esophagus s/p endoscopy normal findings-resolved as of 01/30/2024 - s/p EGD on  3/1; mild esophagitis no bleeding - patient recommended to continue soft diet and protonix  COPD with acute exacerbation (HCC) - prevented d/c on 3/2 due to SOB and WOB - started on solumedrol and nebs with good response - continue on outpatient prednisone taper already in place  -Follows with pulmonology outpatient  Iron deficiency anemia - Low iron stores - Received Ferrlecit during hospitalization and also unit of blood -Hemoglobin 9.8 g/dL at discharge - Outpatient iron labs to be repeated in about 3 months  Acute respiratory failure with hypoxia (HCC) - Had increased shortness of breath and work of breathing with hypoxia requiring oxygen - Walk test performed prior to discharge and patient to be discharged with oxygen       The patient's acute and chronic medical conditions were treated accordingly. On day of discharge, patient was felt deemed stable for discharge. Patient/family member advised to call PCP or come back to ER if needed.   Principal Diagnosis: Food impaction of esophagus  Discharge Diagnoses: Active Hospital Problems   Diagnosis Date Noted   COPD with acute exacerbation (HCC) 01/31/2024    Priority: 1.   Acute on chronic anemia 07/15/2020    Priority: 2.   Iron deficiency anemia 01/30/2024    Priority: 3.   History of arteriovenous malformation (AVM) 01/30/2024    Priority: 3.   Acute respiratory failure with hypoxia (HCC) 01/31/2024    Priority: 4.   Diverticulosis 01/30/2024    Priority: 4.   History of dysphagia 01/30/2024    Priority:  5.   Internal hemorrhoids 01/30/2024    Priority: 5.   History of COPD 01/30/2024   History of TIA (transient ischemic attack) 01/30/2024   PVD (peripheral vascular disease) (HCC) 07/15/2020   GERD (gastroesophageal reflux disease) 05/03/2018   Hyperlipidemia 06/28/2015   Renal artery stenosis (HCC) 06/28/2015   Essential hypertension 02/06/2013    Resolved Hospital Problems   Diagnosis Date Noted Date  Resolved   Food impaction of esophagus s/p endoscopy normal findings 01/30/2024 01/30/2024    Priority: 1.     Discharge Instructions     Increase activity slowly   Complete by: As directed    Increase activity slowly   Complete by: As directed       Allergies as of 01/31/2024   No Known Allergies      Medication List     TAKE these medications    albuterol (2.5 MG/3ML) 0.083% nebulizer solution Commonly known as: PROVENTIL INHALE 3 ML BY NEBULIZATION EVERY 6 HOURS AS NEEDED FOR WHEEZING OR SHORTNESS OF BREATH   albuterol 108 (90 Base) MCG/ACT inhaler Commonly known as: VENTOLIN HFA TAKE 2 PUFFS BY MOUTH EVERY 6 HOURS AS NEEDED FOR WHEEZE OR SHORTNESS OF BREATH   ALPRAZolam 0.25 MG tablet Commonly known as: Xanax Take 1 tablet (0.25 mg total) by mouth daily as needed (for shortness of breath not relieved by nebulizers).   amLODipine 5 MG tablet Commonly known as: NORVASC Take 5 mg by mouth daily.   aspirin EC 81 MG tablet 1 tablet Orally Once a day   atorvastatin 20 MG tablet Commonly known as: LIPITOR TAKE 1 TABLET BY MOUTH EVERY DAY   clopidogrel 75 MG tablet Commonly known as: PLAVIX Take 1 tablet by mouth daily.   gabapentin 100 MG capsule Commonly known as: NEURONTIN Take 100 mg by mouth at bedtime.   HYDROcodone-acetaminophen 7.5-325 MG tablet Commonly known as: NORCO Take 1 tablet by mouth 5 (five) times daily.   ipratropium 0.03 % nasal spray Commonly known as: ATROVENT PLACE 2 SPRAYS INTO BOTH NOSTRILS 2 (TWO) TIMES DAILY AS NEEDED FOR RHINITIS.   Iron Supplement 15 MG/ML Soln Generic drug: ferrous sulfate Take 2.9 mLs (217.5 mg total) by mouth daily with breakfast.   losartan 100 MG tablet Commonly known as: COZAAR Take 100 mg by mouth every evening.   mirtazapine 15 MG tablet Commonly known as: REMERON Take 15 mg by mouth at bedtime.   montelukast 10 MG tablet Commonly known as: SINGULAIR Take 10 mg by mouth at bedtime.    MULTIVITAMIN PO Take 1 tablet by mouth daily.   nystatin 100000 UNIT/ML suspension Commonly known as: MYCOSTATIN Take 5 mLs (500,000 Units total) by mouth 4 (four) times daily.   pantoprazole 40 MG tablet Commonly known as: PROTONIX Take 40 mg by mouth daily.   predniSONE 5 MG tablet Commonly known as: DELTASONE Take 4 tablets (20mg ) daily x 5 days; then 2 tablets (10mg ) daily x 5 days; then stay on 1 tablet (5mg ) daily until follow-up   PreserVision AREDS 2 Caps Take 1 capsule by mouth 2 (two) times daily.   propranolol ER 120 MG 24 hr capsule Commonly known as: INDERAL LA Take 120 mg by mouth daily.   Trelegy Ellipta 200-62.5-25 MCG/ACT Aepb Generic drug: Fluticasone-Umeclidin-Vilant INHALE 1 PUFF BY MOUTH EVERY DAY               Durable Medical Equipment  (From admission, onward)           Start  Ordered   01/31/24 1404  For home use only DME oxygen  Once       Comments: Please evaluate pt for POC at 1-6 pulse dose, if pt qualifies please dispense  Question Answer Comment  Length of Need 6 Months   Mode or (Route) Nasal cannula   Liters per Minute 2   Frequency Continuous (stationary and portable oxygen unit needed)   Oxygen conserving device Yes   Oxygen delivery system Gas      01/31/24 1403            Follow-up Information     Adrian Prince, MD. Schedule an appointment as soon as possible for a visit in 2 week(s).   Specialty: Endocrinology Contact information: [address] [place] [address] [phone]         [facility] Follow up.   Specialty: Home Health Services Contact information: [address] [place] [address] [phone]                No Known Allergies  Consultations: GI  Procedures: 3/1: EGD  Discharge Exam: BP (!) 196/60 (BP Location: Right Arm)   Pulse 71   Temp 98.5 F (36.9 C) (Oral)   Resp 16   Ht 5\' 2"  (1.575 m)   Wt 38.6 kg   LMP 11/30/1976   SpO2  94%   BMI 15.55 kg/m  Physical Exam Constitutional:      Appearance: Normal appearance.  HENT:     Head: Normocephalic and atraumatic.     Mouth/Throat:     Mouth: Mucous membranes are moist.  Eyes:     Extraocular Movements: Extraocular movements intact.  Cardiovascular:     Rate and Rhythm: Normal rate and regular rhythm.  Pulmonary:     Effort: Pulmonary effort is normal. No respiratory distress.     Breath sounds: Normal breath sounds. No wheezing.  Abdominal:     General: Bowel sounds are normal. There is no distension.     Palpations: Abdomen is soft.     Tenderness: There is no abdominal tenderness.  Musculoskeletal:        General: Normal range of motion.     Cervical back: Normal range of motion and neck supple.  Skin:    General: Skin is warm and dry.  Neurological:     General: No focal deficit present.     Mental Status: She is alert.  Psychiatric:        Mood and Affect: Mood normal.      The results of significant diagnostics from this hospitalization (including imaging, microbiology, ancillary and laboratory) are listed below for reference.   Microbiology: No results found for this or any previous visit (from the past 240 hours).   Labs: BNP (last 3 results) No results for input(s): "BNP" in the last 8760 hours. Basic Metabolic Panel: Recent Labs  Lab 01/29/24 2002 01/30/24 0323  NA 140 140  K 4.0 4.2  CL 106 105  CO2 27 26  GLUCOSE 149* 118*  BUN 20 18  CREATININE 0.61 0.52  CALCIUM 8.6* 8.3*   Liver Function Tests: Recent Labs  Lab 01/30/24 0323  AST 16  ALT 21  ALKPHOS 56  BILITOT 0.6  PROT 5.2*  ALBUMIN 2.8*   No results for input(s): "LIPASE", "AMYLASE" in the last 168 hours. No results for input(s): "AMMONIA" in the last 168 hours. CBC: Recent Labs  Lab 01/26/24 [phone] 01/29/24 2002 01/30/24 6606 01/30/24 1637 01/31/24 3016  WBC 10.7 6.9 9.2  --  7.8  NEUTROABS 7,137 5.2  --   --  5.5  HGB 8.2* 7.6* 7.1* 9.8* 9.8*  HCT  32.8* 30.5* 28.6* 35.6* 34.8*  MCV 72.9* 76.3* 78.8*  --  76.5*  PLT 281 195 149*  --  161   Cardiac Enzymes: No results for input(s): "CKTOTAL", "CKMB", "CKMBINDEX", "TROPONINI" in the last 168 hours. BNP: Invalid input(s): "POCBNP" CBG: No results for input(s): "GLUCAP" in the last 168 hours. D-Dimer No results for input(s): "DDIMER" in the last 72 hours. Hgb A1c No results for input(s): "HGBA1C" in the last 72 hours. Lipid Profile No results for input(s): "CHOL", "HDL", "LDLCALC", "TRIG", "CHOLHDL", "LDLDIRECT" in the last 72 hours. Thyroid function studies No results for input(s): "TSH", "T4TOTAL", "T3FREE", "THYROIDAB" in the last 72 hours.  Invalid input(s): "FREET3" Anemia work up Recent Labs    01/29/24 2101 01/30/24 1243  VITAMINB12  --  429  FERRITIN 4*  --   TIBC 469*  --   IRON 25*  --    Urinalysis    Component Value Date/Time   COLORURINE YELLOW 05/20/2021 1042   APPEARANCEUR CLEAR 05/20/2021 1042   LABSPEC 1.011 05/20/2021 1042   PHURINE 7.0 05/20/2021 1042   GLUCOSEU NEGATIVE 05/20/2021 1042   HGBUR NEGATIVE 05/20/2021 1042   BILIRUBINUR NEGATIVE 05/20/2021 1042   BILIRUBINUR N 05/18/2016 1051   KETONESUR NEGATIVE 05/20/2021 1042   PROTEINUR 30 (A) 05/20/2021 1042   UROBILINOGEN negative 05/18/2016 1051   NITRITE NEGATIVE 05/20/2021 1042   LEUKOCYTESUR NEGATIVE 05/20/2021 1042   Sepsis Labs Recent Labs  Lab 01/26/24 0949 01/29/24 2002 01/30/24 0323 01/31/24 0634  WBC 10.7 6.9 9.2 7.8   Microbiology No results found for this or any previous visit (from the past 240 hours).  Procedures/Studies: DG Chest Portable 1 View Result Date: 01/29/2024 CLINICAL DATA:  dysphagia, choking, 2L o2 requirement EXAM: PORTABLE CHEST - 1 VIEW COMPARISON:  01/09/2024 FINDINGS: Cardiomediastinal silhouette and pulmonary vasculature are within normal limits. Lungs are hyperexpanded, but otherwise clear. IMPRESSION: No acute cardiopulmonary process. Electronically  Signed   By: Acquanetta Belling M.D.   On: 01/29/2024 21:18   DG Chest 2 View Result Date: 01/09/2024 CLINICAL DATA:  Cough and shortness of breath for several days. Hypoxia. EXAM: CHEST - 2 VIEW COMPARISON:  07/24/2020 FINDINGS: The heart size and mediastinal contours are within normal limits. Marked pulmonary hyperinflation again seen, consistent with emphysema. Both lungs are clear. The visualized skeletal structures are unremarkable. IMPRESSION: Emphysema. No active cardiopulmonary disease. Electronically Signed   By: Danae Orleans M.D.   On: 01/09/2024 13:51     Time coordinating discharge: Over 30 minutes    Lewie Chamber, MD  [facility] 01/31/2024, 2:56 PM

## 2024-01-31 NOTE — Telephone Encounter (Signed)
 03/16/24 at 2pm with Doug Sou, PA.

## 2024-01-31 NOTE — Plan of Care (Signed)

## 2024-01-31 NOTE — Progress Notes (Signed)
 Physical Therapy Treatment Patient Details Name: Jessica [name] MRN: [id number] DOB: 1932/01/05 Today's Date: 01/31/2024   History of Present Illness The pt is an 89 yo female presenting 3/1 with dysphagia after a piece of burger got stuck in her throat. S/p upper endoscopy 3/1. PMH includes: anemia, arthritis, low back pain, COPD GERD, HTN, and bilateral cataracts surgery.    PT Comments  Pt received in chair, agreeable to therapy session and with good participation and improved tolerance for gait training while monitoring VS. Pt hypoxic on RA with exertion and desat to 85% on RA with household distance gait trial using RW. After seated break O2 did not improve >88% within 30 seconds so pt placed on 2L o2 B and E and SpO2 reading 92% and greater with rest and activity (return gait trial to room), RN notified. Pt continues to benefit from PT services to progress toward functional mobility goals.    If plan is discharge home, recommend the following: Assist for transportation;Assistance with cooking/housework   Can travel by private vehicle        Equipment Recommendations  None recommended by PT;Other (comment) (pt has needed DME; needs supplemental O2)    Recommendations for Other Services       Precautions / Restrictions Precautions Precautions: Fall Recall of Precautions/Restrictions: Intact Restrictions Weight Bearing Restrictions Per Provider Order: No     Mobility  Bed Mobility Overal bed mobility: Needs Assistance             General bed mobility comments: pt received up in recliner    Transfers Overall transfer level: Needs assistance Equipment used: Rolling walker (2 wheels), None Transfers: Sit to/from Stand Sit to Stand: Supervision           General transfer comment: cues for safe UE placement    Ambulation/Gait Ambulation/Gait assistance: Supervision Gait Distance (Feet): 100 Feet (x2 with seated break) Assistive device: Rolling walker (2 wheels) Gait  Pattern/deviations: Step-through pattern, Decreased stride length, Trunk flexed Gait velocity: decreased     General Gait Details: Needs x3 cues for closer proximity to RW and x1 seated break to achieve ~22ft distance, no buckling. DOE 3/4 during seated break after amb on RA and toward end of 111ft trial, pt desat to 85-86% on RA. SpO2 improved to >92% on 2L O2 [place] at rest and wtih exertional task, pt left on 2L wall O2 [place] at end of session.   Stairs Stairs:  (simulated standing high hip flexion without observed pt difficulty)           Wheelchair Mobility     Tilt Bed    Modified Rankin (Stroke Patients Only)       Balance Overall balance assessment: Needs assistance Sitting-balance support: No upper extremity supported, Feet supported Sitting balance-Leahy Scale: Good     Standing balance support: Bilateral upper extremity supported, During functional activity Standing balance-Leahy Scale: Fair Standing balance comment: fair static standing, good with RW support                            Communication Communication Communication: No apparent difficulties  Cognition Arousal: Alert Behavior During Therapy: Flat affect   PT - Cognitive impairments: No apparent impairments, No family/caregiver present to determine baseline                       PT - Cognition Comments: Pt appears frustrated and although PTA explains purpose of session multiple times,  need for identifying post-discharge O2 needs and that O2 will need to be arranged for her for home, pt answered phone at end of session and states to caller "I don't know why there are keeping me here, I don't know what to do". Following commands: Intact      Cueing Cueing Techniques: Verbal cues  Exercises      General Comments General comments (skin integrity, edema, etc.): see gait comments; needs 2L O2 [place] with exertion      Pertinent Vitals/Pain Pain Assessment Pain Assessment: No/denies  pain    Home Living                          Prior Function            PT Goals (current goals can now be found in the care plan section) Acute Rehab PT Goals Patient Stated Goal: improve breathing and return home PT Goal Formulation: With patient Time For Goal Achievement: 02/13/24 Progress towards PT goals: Progressing toward goals    Frequency    Min 1X/week      PT Plan      Co-evaluation              AM-PAC PT "6 Clicks" Mobility   Outcome Measure  Help needed turning from your back to your side while in a flat bed without using bedrails?: None Help needed moving from lying on your back to sitting on the side of a flat bed without using bedrails?: A Little (anticipated without rails) Help needed moving to and from a bed to a chair (including a wheelchair)?: A Little Help needed standing up from a chair using your arms (e.g., wheelchair or bedside chair)?: A Little Help needed to walk in hospital room?: A Little Help needed climbing 3-5 steps with a railing? : A Little 6 Click Score: 19    End of Session Equipment Utilized During Treatment: Gait belt;Oxygen Activity Tolerance: Patient tolerated treatment well;Patient limited by fatigue Patient left: in chair;with call bell/phone within reach;with chair alarm set Nurse Communication: Mobility status;Other (comment) (O2 desat on RA) PT Visit Diagnosis: Unsteadiness on feet (R26.81);Muscle weakness (generalized) (M62.81)     Time: 7829-5621 PT Time Calculation (min) (ACUTE ONLY): 14 min  Charges:    $Gait Training: 8-22 mins PT General Charges $$ ACUTE PT VISIT: 1 Visit                     Safir Michalec P., PTA Acute Rehabilitation Services Secure Chat Preferred 9a-5:30pm Office: [phone]    Dorathy Kinsman [facility] 01/31/2024, 11:50 AM

## 2024-01-31 NOTE — Progress Notes (Addendum)
 Progress Note    Jessica [name]   FAO:[id number]  DOB: 12-Jul-1932  DOA: 01/29/2024     0 PCP: Adrian Prince, MD  Initial CC: food impaction  Hospital Course: Ms. Jessica [name] is an 89 yo female with PMH diverticulosis, hemorrhoids, cecal AVM, dysphagia, GERD, HLD, migraine, anemia, COPD, HTN, TIA, renal artery stenosis, PVD, HLD who presented after having difficulty swallowing food.  She had been eating and developed significant dysphagia due to food impaction. He was taken for urgent EGD on admission and found to have no food bolus noted and only mild esophagitis otherwise no other significant findings.  She was recommended for soft diet and Protonix.  On further workup she was found to have worsened anemia with hemoglobin down to 7.6 g/dL on admission with further dilutional downtrend after some fluids to 7.1 g/dL.  Baseline hemoglobin appeared to be around 8 to 11 g/dL.  Iron stores were also checked and low.  She was treated with dose of Ferrlecit and also given 1 unit PRBC.  She will follow-up outpatient with GI to discuss any further need for repeat colonoscopy.  Last colonoscopy was performed 2017 showing sigmoid diverticulosis, internal hemorrhoids, cecal AVMs.  Interval History:  Seen after EGD. Was swallowing better overall.  Initially we planned for discharge but she became more short of breath and was started on further treatment for COPD.  Assessment and Plan: * Food impaction of esophagus s/p endoscopy normal findings-resolved as of 01/30/2024 - s/p EGD on 3/1; mild esophagitis no bleeding - patient recommended to continue soft diet and protonix  COPD with acute exacerbation (HCC) - prevented d/c on 3/2 due to SOB and WOB - started on solumedrol and nebs with good response - continue on outpatient prednisone taper already in place  -Follows with pulmonology outpatient  Iron deficiency anemia - Low iron stores - Received Ferrlecit during hospitalization and also unit of  blood -Hemoglobin 9.8 g/dL at discharge - Outpatient iron labs to be repeated in about 3 months  Acute respiratory failure with hypoxia (HCC) - Had increased shortness of breath and work of breathing with hypoxia requiring oxygen - Walk test performed prior to discharge and patient to be discharged with oxygen   Old records reviewed in assessment of this patient  Antimicrobials:   DVT prophylaxis:  SCDs Start: 01/30/24 0221 Place TED hose Start: 01/30/24 0221   Code Status:   Code Status: Full Code  Mobility Assessment (Last 72 Hours)     Mobility Assessment     Row Name 01/31/24 1139 01/30/24 2100 01/30/24 1600 01/30/24 1220 01/30/24 0900   Does patient have an order for bedrest or is patient medically unstable -- No - Continue assessment -- No - Continue assessment No - Continue assessment   What is the highest level of mobility based on the progressive mobility assessment? Level 5 (Walks with assist in room/hall) - Balance while stepping forward/back and can walk in room with assist - Complete Level 5 (Walks with assist in room/hall) - Balance while stepping forward/back and can walk in room with assist - Complete Level 5 (Walks with assist in room/hall) - Balance while stepping forward/back and can walk in room with assist - Complete Level 3 (Stands with assist) - Balance while standing  and cannot march in place Level 5 (Walks with assist in room/hall) - Balance while stepping forward/back and can walk in room with assist - Complete            Barriers to discharge:  None Disposition Plan: Home HH orders placed:  Status is: Observation  Objective: Blood pressure (!) 196/60, pulse 71, temperature 98.5 F (36.9 C), temperature source Oral, resp. rate 16, height 5\' 2"  (1.575 m), weight 38.6 kg, last menstrual period 11/30/1976, SpO2 94%.  Examination:  Physical Exam Constitutional:      Appearance: Normal appearance.  HENT:     Head: Normocephalic and atraumatic.      Mouth/Throat:     Mouth: Mucous membranes are moist.  Eyes:     Extraocular Movements: Extraocular movements intact.  Cardiovascular:     Rate and Rhythm: Normal rate and regular rhythm.  Pulmonary:     Effort: Pulmonary effort is normal. No respiratory distress.     Breath sounds: Normal breath sounds. No wheezing.  Abdominal:     General: Bowel sounds are normal. There is no distension.     Palpations: Abdomen is soft.     Tenderness: There is no abdominal tenderness.  Musculoskeletal:        General: Normal range of motion.     Cervical back: Normal range of motion and neck supple.  Skin:    General: Skin is warm and dry.  Neurological:     General: No focal deficit present.     Mental Status: She is alert.  Psychiatric:        Mood and Affect: Mood normal.      Consultants:  GI  Procedures:  01/29/2024: EGD  Data Reviewed: Results for orders placed or performed during the hospital encounter of 01/29/24 (from the past 24 hours)  Hemoglobin and hematocrit, blood     Status: Abnormal   Collection Time: 01/30/24  4:37 PM  Result Value Ref Range   Hemoglobin 9.8 (L) 12.0 - 15.0 g/dL   HCT 08.6 (L) 57.8 - 46.9 %  CBC with Differential/Platelet     Status: Abnormal   Collection Time: 01/31/24  6:34 AM  Result Value Ref Range   WBC 7.8 4.0 - 10.5 K/uL   RBC 4.55 3.87 - 5.11 MIL/uL   Hemoglobin 9.8 (L) 12.0 - 15.0 g/dL   HCT 62.9 (L) 52.8 - 41.3 %   MCV 76.5 (L) 80.0 - 100.0 fL   MCH 21.5 (L) 26.0 - 34.0 pg   MCHC 28.2 (L) 30.0 - 36.0 g/dL   RDW 24.4 (H) 01.0 - 27.2 %   Platelets 161 150 - 400 K/uL   nRBC 0.5 (H) 0.0 - 0.2 %   Neutrophils Relative % 72 %   Neutro Abs 5.5 1.7 - 7.7 K/uL   Lymphocytes Relative 18 %   Lymphs Abs 1.4 0.7 - 4.0 K/uL   Monocytes Relative 9 %   Monocytes Absolute 0.7 0.1 - 1.0 K/uL   Eosinophils Relative 0 %   Eosinophils Absolute 0.0 0.0 - 0.5 K/uL   Basophils Relative 0 %   Basophils Absolute 0.0 0.0 - 0.1 K/uL   Immature  Granulocytes 1 %   Abs Immature Granulocytes 0.09 (H) 0.00 - 0.07 K/uL    I have reviewed pertinent nursing notes, vitals, labs, and images as necessary. I have ordered labwork to follow up on as indicated.  I have reviewed the last notes from staff over past 24 hours. I have discussed patient's care plan and test results with nursing staff, CM/SW, and other staff as appropriate.  Time spent: Greater than 50% of the 55 minute visit was spent in counseling/coordination of care for the patient as laid out in the A&P.  LOS: 0 days   Lewie Chamber, MD [facility] 01/31/2024, 2:58 PM

## 2024-01-31 NOTE — Progress Notes (Addendum)
 SATURATION QUALIFICATIONS: (This note is used to comply with regulatory documentation for home oxygen)  Patient Saturations on Room Air at Rest = 89%  Patient Saturations on Room Air while Ambulating = 85%  Patient Saturations on 2 Liters of oxygen while Ambulating = 94%  Please briefly explain why patient needs home oxygen: Pt hypoxic on RA with exertion.

## 2024-01-31 NOTE — Assessment & Plan Note (Signed)
-   s/p EGD on 3/1; mild esophagitis no bleeding - patient recommended to continue soft diet and protonix

## 2024-01-31 NOTE — Telephone Encounter (Signed)
-----   Message from Mike Gip sent at 01/30/2024  1:32 PM EST ----- Patient was in overnight with a food impaction, had EGD with Dr. Lavon Paganini   Get her scheduled for an office follow-up with Dr. Juliann Mule you

## 2024-01-31 NOTE — Assessment & Plan Note (Signed)
-   Low iron stores - Received Ferrlecit during hospitalization and also unit of blood -Hemoglobin 9.8 g/dL at discharge - Outpatient iron labs to be repeated in about 3 months

## 2024-02-01 DIAGNOSIS — I701 Atherosclerosis of renal artery: Secondary | ICD-10-CM | POA: Diagnosis not present

## 2024-02-01 DIAGNOSIS — I739 Peripheral vascular disease, unspecified: Secondary | ICD-10-CM | POA: Diagnosis not present

## 2024-02-01 DIAGNOSIS — K5790 Diverticulosis of intestine, part unspecified, without perforation or abscess without bleeding: Secondary | ICD-10-CM | POA: Diagnosis not present

## 2024-02-01 DIAGNOSIS — Z7902 Long term (current) use of antithrombotics/antiplatelets: Secondary | ICD-10-CM | POA: Diagnosis not present

## 2024-02-01 DIAGNOSIS — Z7982 Long term (current) use of aspirin: Secondary | ICD-10-CM | POA: Diagnosis not present

## 2024-02-01 DIAGNOSIS — E785 Hyperlipidemia, unspecified: Secondary | ICD-10-CM | POA: Diagnosis not present

## 2024-02-01 DIAGNOSIS — E43 Unspecified severe protein-calorie malnutrition: Secondary | ICD-10-CM | POA: Diagnosis not present

## 2024-02-01 DIAGNOSIS — Z87891 Personal history of nicotine dependence: Secondary | ICD-10-CM | POA: Diagnosis not present

## 2024-02-01 DIAGNOSIS — Z8701 Personal history of pneumonia (recurrent): Secondary | ICD-10-CM | POA: Diagnosis not present

## 2024-02-01 DIAGNOSIS — M5416 Radiculopathy, lumbar region: Secondary | ICD-10-CM | POA: Diagnosis not present

## 2024-02-01 DIAGNOSIS — D509 Iron deficiency anemia, unspecified: Secondary | ICD-10-CM | POA: Diagnosis not present

## 2024-02-01 DIAGNOSIS — Z681 Body mass index (BMI) 19 or less, adult: Secondary | ICD-10-CM | POA: Diagnosis not present

## 2024-02-01 DIAGNOSIS — G43909 Migraine, unspecified, not intractable, without status migrainosus: Secondary | ICD-10-CM | POA: Diagnosis not present

## 2024-02-01 DIAGNOSIS — K219 Gastro-esophageal reflux disease without esophagitis: Secondary | ICD-10-CM | POA: Diagnosis not present

## 2024-02-01 DIAGNOSIS — H269 Unspecified cataract: Secondary | ICD-10-CM | POA: Diagnosis not present

## 2024-02-01 DIAGNOSIS — Z8673 Personal history of transient ischemic attack (TIA), and cerebral infarction without residual deficits: Secondary | ICD-10-CM | POA: Diagnosis not present

## 2024-02-01 DIAGNOSIS — Z9981 Dependence on supplemental oxygen: Secondary | ICD-10-CM | POA: Diagnosis not present

## 2024-02-01 DIAGNOSIS — E781 Pure hyperglyceridemia: Secondary | ICD-10-CM | POA: Diagnosis not present

## 2024-02-01 DIAGNOSIS — K649 Unspecified hemorrhoids: Secondary | ICD-10-CM | POA: Diagnosis not present

## 2024-02-01 DIAGNOSIS — Z7951 Long term (current) use of inhaled steroids: Secondary | ICD-10-CM | POA: Diagnosis not present

## 2024-02-01 DIAGNOSIS — R32 Unspecified urinary incontinence: Secondary | ICD-10-CM | POA: Diagnosis not present

## 2024-02-01 DIAGNOSIS — I10 Essential (primary) hypertension: Secondary | ICD-10-CM | POA: Diagnosis not present

## 2024-02-01 DIAGNOSIS — J441 Chronic obstructive pulmonary disease with (acute) exacerbation: Secondary | ICD-10-CM | POA: Diagnosis not present

## 2024-02-01 DIAGNOSIS — K5900 Constipation, unspecified: Secondary | ICD-10-CM | POA: Diagnosis not present

## 2024-02-01 DIAGNOSIS — M1990 Unspecified osteoarthritis, unspecified site: Secondary | ICD-10-CM | POA: Diagnosis not present

## 2024-02-02 ENCOUNTER — Telehealth: Payer: Self-pay | Admitting: Pulmonary Disease

## 2024-02-02 NOTE — Telephone Encounter (Signed)
 [name] w/ [facility] is calling stating PT is being released from the hospital and she would like to have 02 for her home. [name]'s # is [phone] and PT's @ [phone]

## 2024-02-02 NOTE — Telephone Encounter (Signed)
 Patient is returning phone call. Patient phone number is [phone].

## 2024-02-02 NOTE — Telephone Encounter (Signed)
 ATC [name] with [facility] regarding oxygen.  I left a detailed message that according to the d/c note patient is being sent home with oxygen, this is being taken care of by the hospital doctors and there is nothing that needs to be done by our office.  Advised to call back with any questions.

## 2024-02-03 DIAGNOSIS — I701 Atherosclerosis of renal artery: Secondary | ICD-10-CM | POA: Diagnosis not present

## 2024-02-03 DIAGNOSIS — D509 Iron deficiency anemia, unspecified: Secondary | ICD-10-CM | POA: Diagnosis not present

## 2024-02-03 DIAGNOSIS — I10 Essential (primary) hypertension: Secondary | ICD-10-CM | POA: Diagnosis not present

## 2024-02-03 DIAGNOSIS — I739 Peripheral vascular disease, unspecified: Secondary | ICD-10-CM | POA: Diagnosis not present

## 2024-02-03 DIAGNOSIS — G43909 Migraine, unspecified, not intractable, without status migrainosus: Secondary | ICD-10-CM | POA: Diagnosis not present

## 2024-02-03 DIAGNOSIS — J441 Chronic obstructive pulmonary disease with (acute) exacerbation: Secondary | ICD-10-CM | POA: Diagnosis not present

## 2024-02-03 NOTE — Telephone Encounter (Signed)
 Called and spoke with patient, she verified that she did get discharged home with oxygen.  She said shehas an appointment with the DME company for the walk test for the POC.  She also has a f/u scheduled with Dr. Francine Graven on 4/28.  I advised her to let us know if she needs anything prior to her f/u with our office.  She verbalized understanding.  Nothing further needed.

## 2024-02-07 DIAGNOSIS — I701 Atherosclerosis of renal artery: Secondary | ICD-10-CM | POA: Diagnosis not present

## 2024-02-07 DIAGNOSIS — J441 Chronic obstructive pulmonary disease with (acute) exacerbation: Secondary | ICD-10-CM | POA: Diagnosis not present

## 2024-02-07 DIAGNOSIS — G43909 Migraine, unspecified, not intractable, without status migrainosus: Secondary | ICD-10-CM | POA: Diagnosis not present

## 2024-02-07 DIAGNOSIS — I10 Essential (primary) hypertension: Secondary | ICD-10-CM | POA: Diagnosis not present

## 2024-02-07 DIAGNOSIS — I739 Peripheral vascular disease, unspecified: Secondary | ICD-10-CM | POA: Diagnosis not present

## 2024-02-07 DIAGNOSIS — D509 Iron deficiency anemia, unspecified: Secondary | ICD-10-CM | POA: Diagnosis not present

## 2024-02-08 DIAGNOSIS — E781 Pure hyperglyceridemia: Secondary | ICD-10-CM | POA: Diagnosis not present

## 2024-02-08 DIAGNOSIS — J441 Chronic obstructive pulmonary disease with (acute) exacerbation: Secondary | ICD-10-CM | POA: Diagnosis not present

## 2024-02-08 DIAGNOSIS — M5416 Radiculopathy, lumbar region: Secondary | ICD-10-CM | POA: Diagnosis not present

## 2024-02-08 DIAGNOSIS — G43909 Migraine, unspecified, not intractable, without status migrainosus: Secondary | ICD-10-CM | POA: Diagnosis not present

## 2024-02-08 DIAGNOSIS — I739 Peripheral vascular disease, unspecified: Secondary | ICD-10-CM | POA: Diagnosis not present

## 2024-02-08 DIAGNOSIS — M1990 Unspecified osteoarthritis, unspecified site: Secondary | ICD-10-CM | POA: Diagnosis not present

## 2024-02-08 DIAGNOSIS — H269 Unspecified cataract: Secondary | ICD-10-CM | POA: Diagnosis not present

## 2024-02-08 DIAGNOSIS — D509 Iron deficiency anemia, unspecified: Secondary | ICD-10-CM | POA: Diagnosis not present

## 2024-02-08 DIAGNOSIS — K649 Unspecified hemorrhoids: Secondary | ICD-10-CM | POA: Diagnosis not present

## 2024-02-08 DIAGNOSIS — I701 Atherosclerosis of renal artery: Secondary | ICD-10-CM | POA: Diagnosis not present

## 2024-02-08 DIAGNOSIS — K5790 Diverticulosis of intestine, part unspecified, without perforation or abscess without bleeding: Secondary | ICD-10-CM | POA: Diagnosis not present

## 2024-02-08 DIAGNOSIS — I10 Essential (primary) hypertension: Secondary | ICD-10-CM | POA: Diagnosis not present

## 2024-02-10 ENCOUNTER — Telehealth: Payer: Self-pay | Admitting: Primary Care

## 2024-02-10 DIAGNOSIS — J441 Chronic obstructive pulmonary disease with (acute) exacerbation: Secondary | ICD-10-CM | POA: Diagnosis not present

## 2024-02-10 DIAGNOSIS — I701 Atherosclerosis of renal artery: Secondary | ICD-10-CM | POA: Diagnosis not present

## 2024-02-10 DIAGNOSIS — G43909 Migraine, unspecified, not intractable, without status migrainosus: Secondary | ICD-10-CM | POA: Diagnosis not present

## 2024-02-10 DIAGNOSIS — I10 Essential (primary) hypertension: Secondary | ICD-10-CM | POA: Diagnosis not present

## 2024-02-10 DIAGNOSIS — D509 Iron deficiency anemia, unspecified: Secondary | ICD-10-CM | POA: Diagnosis not present

## 2024-02-10 DIAGNOSIS — I739 Peripheral vascular disease, unspecified: Secondary | ICD-10-CM | POA: Diagnosis not present

## 2024-02-10 NOTE — Telephone Encounter (Signed)
 Home Health nurse says PT needs a lighter POC.The one ordered recently is too heavy.  Person that delivered it states they do make a lighter version. Please call PT to advise action taken.

## 2024-02-10 NOTE — Telephone Encounter (Signed)
 I called and spoke with Revonda Standard with [facility]. Revonda Standard stated pt is on 2L o2 POC. I advised that pt was given this when she was admitted in the hospital. Our office has not seen the pt for o2 before so pt would need to be seen by pulmonologist first before any orders can be sent. Revonda Standard verbalized understanding and stated she would inform pt. NFN

## 2024-02-14 DIAGNOSIS — I739 Peripheral vascular disease, unspecified: Secondary | ICD-10-CM | POA: Diagnosis not present

## 2024-02-14 DIAGNOSIS — I10 Essential (primary) hypertension: Secondary | ICD-10-CM | POA: Diagnosis not present

## 2024-02-14 DIAGNOSIS — J441 Chronic obstructive pulmonary disease with (acute) exacerbation: Secondary | ICD-10-CM | POA: Diagnosis not present

## 2024-02-14 DIAGNOSIS — D509 Iron deficiency anemia, unspecified: Secondary | ICD-10-CM | POA: Diagnosis not present

## 2024-02-14 DIAGNOSIS — G43909 Migraine, unspecified, not intractable, without status migrainosus: Secondary | ICD-10-CM | POA: Diagnosis not present

## 2024-02-14 DIAGNOSIS — I701 Atherosclerosis of renal artery: Secondary | ICD-10-CM | POA: Diagnosis not present

## 2024-02-18 ENCOUNTER — Other Ambulatory Visit: Payer: Self-pay | Admitting: Primary Care

## 2024-02-18 ENCOUNTER — Other Ambulatory Visit: Payer: Self-pay | Admitting: Pulmonary Disease

## 2024-02-21 NOTE — Progress Notes (Deleted)
 Patient name: Jessica [name] MRN: [id number] DOB: 1932-05-31 Sex: female  REASON FOR VISIT: 3 month follow-up, mesenteric artery disease  HPI: Jessica [name] is a 89 y.o. female well-known to vascular surgery that presents for 3 month interval follow-up for surveillance of her mesenteric artery disease and ongoing surveillance of her celiac stent with concern for in-stent stenosis.   She initially underwent angioplasty and stent of her celiac artery on 05/15/2020 for mesenteric ischemia with chronic  gastritis.  There was also question of an SMA stenosis that was widely patent on angiogram and no intervention was performed on the SMA.  She had complication of closure device in the right groin requiring common femoral endarterectomy.  She then underwent celiac stent angioplasty on 05/15/2021 for an in-stent stenosis.  We used a Celt closure device in the left common femoral artery and ultimately she had a complication of the closure device and developed a left common femoral occlusion.  She subsequently underwent a left common femoral endarterectomy with bovine patch with findings of a dissection on 05/20/2021.     Past Medical History:  Diagnosis Date   Anemia    Arthritis    "thumbs" (06/27/2015)   Bilateral renal artery stenosis (HCC)    Cataract    Chronic bronchitis (HCC)    Chronic lower back pain    COPD (chronic obstructive pulmonary disease) (HCC)    GERD (gastroesophageal reflux disease)    Headache    "probably weekly" (06/27/2015)   Hypertension    Hypertriglyceridemia    Migraine    "maybe monthly" (06/27/2015)   Pneumonia     Past Surgical History:  Procedure Laterality Date   BALLOON DILATION  1980's?   "for renal stenosis"   CATARACT EXTRACTION, BILATERAL     ENDARTERECTOMY FEMORAL Right 05/15/2020   Procedure: Right Common Femoral Endarterectomy;  Surgeon: Cephus Shelling, MD;  Location: [facility] OR;  Service: Vascular;  Laterality: Right;   ESOPHAGOGASTRODUODENOSCOPY N/A  01/29/2024   Procedure: ESOPHAGOGASTRODUODENOSCOPY (EGD);  Surgeon: Napoleon Form, MD;  Location: [facility] ENDOSCOPY;  Service: Gastroenterology;  Laterality: N/A;   FEMORAL ARTERY EXPLORATION Left 05/20/2021   Procedure: LEFT LEG THROMBECTOMY WITH LEFT FEMORAL  ENDARTERECTOMY AND PATCH ANGIOPLASTY;  Surgeon: Larina Earthly, MD;  Location: [facility] OR;  Service: Vascular;  Laterality: Left;   PATCH ANGIOPLASTY Right 05/15/2020   Procedure: Common Femoral Artery Patch Angioplasty using XenoSure Bovine Pericardial Patch;  Surgeon: Cephus Shelling, MD;  Location: [facility] OR;  Service: Vascular;  Laterality: Right;   PERIPHERAL VASCULAR BALLOON ANGIOPLASTY  05/15/2021   Procedure: PERIPHERAL VASCULAR BALLOON ANGIOPLASTY;  Surgeon: Cephus Shelling, MD;  Location: [facility] INVASIVE CV LAB;  Service: Cardiovascular;;   Celiac   PERIPHERAL VASCULAR INTERVENTION  05/15/2020   Procedure: PERIPHERAL VASCULAR INTERVENTION;  Surgeon: Cephus Shelling, MD;  Location: [facility] INVASIVE CV LAB;  Service: Cardiovascular;;  Celiac   RECONSTRUCTION OF EYELID Bilateral 01/2016   Upper and lower eyelid   RENAL ANGIOPLASTY     TOE SURGERY Bilateral    "straightened big toe"   TONSILLECTOMY  ~ 1941   VAGINAL HYSTERECTOMY  1973   VISCERAL ANGIOGRAPHY N/A 05/15/2020   Procedure: MESENTERIC  ANGIOGRAPHY;  Surgeon: Cephus Shelling, MD;  Location: [facility] INVASIVE CV LAB;  Service: Cardiovascular;  Laterality: N/A;   VISCERAL ANGIOGRAPHY N/A 05/15/2021   Procedure: MESENTRIC ANGIOGRAPHY;  Surgeon: Cephus Shelling, MD;  Location: [facility] INVASIVE CV LAB;  Service: Cardiovascular;  Laterality: N/A;    Family  History  Problem Relation Age of Onset   Parkinsonism Father    Cancer Brother        Bladder   Hypertension Brother    Hyperlipidemia Brother    Stroke Maternal Grandfather    Colon cancer Neg Hx    Colon polyps Neg Hx    Stomach cancer Neg Hx     SOCIAL HISTORY: Social History   Tobacco Use   Smoking status: Former     Current packs/day: 0.50    Average packs/day: 0.5 packs/day for 20.0 years (10.0 ttl pk-yrs)    Types: Cigarettes   Smokeless tobacco: Never   Tobacco comments:    'quit smoking in the 1990's?"  Substance Use Topics   Alcohol use: Not Currently    Alcohol/week: 0.0 - 1.0 standard drinks of alcohol    Comment: rarely    No Known Allergies  Current Outpatient Medications  Medication Sig Dispense Refill   albuterol (PROVENTIL) (2.5 MG/3ML) 0.083% nebulizer solution INHALE 3 ML BY NEBULIZATION EVERY 6 HOURS AS NEEDED FOR WHEEZING OR SHORTNESS OF BREATH 75 mL 12   albuterol (VENTOLIN HFA) 108 (90 Base) MCG/ACT inhaler TAKE 2 PUFFS BY MOUTH EVERY 6 HOURS AS NEEDED FOR WHEEZE OR SHORTNESS OF BREATH 153 g 2   ALPRAZolam (XANAX) 0.25 MG tablet Take 1 tablet (0.25 mg total) by mouth daily as needed (for shortness of breath not relieved by nebulizers). 20 tablet 0   amLODipine (NORVASC) 5 MG tablet Take 5 mg by mouth daily.     aspirin EC 81 MG tablet 1 tablet Orally Once a day     atorvastatin (LIPITOR) 20 MG tablet TAKE 1 TABLET BY MOUTH EVERY DAY 90 tablet 3   clopidogrel (PLAVIX) 75 MG tablet Take 1 tablet by mouth daily.     ferrous sulfate (IRON SUPPLEMENT) 75 (15 Fe) MG/ML SOLN Take 2.9 mLs (217.5 mg total) by mouth daily with breakfast. 50 mL 3   gabapentin (NEURONTIN) 100 MG capsule Take 100 mg by mouth at bedtime.     HYDROcodone-acetaminophen (NORCO) 7.5-325 MG tablet Take 1 tablet by mouth 5 (five) times daily.     ipratropium (ATROVENT) 0.03 % nasal spray PLACE 2 SPRAYS INTO BOTH NOSTRILS 2 (TWO) TIMES DAILY AS NEEDED FOR RHINITIS. 90 mL 1   losartan (COZAAR) 100 MG tablet Take 100 mg by mouth every evening.     mirtazapine (REMERON) 15 MG tablet Take 15 mg by mouth at bedtime.     montelukast (SINGULAIR) 10 MG tablet Take 10 mg by mouth at bedtime.   6   Multiple Vitamins-Minerals (MULTIVITAMIN PO) Take 1 tablet by mouth daily.      Multiple Vitamins-Minerals (PRESERVISION AREDS 2)  CAPS Take 1 capsule by mouth 2 (two) times daily.     nystatin (MYCOSTATIN) 100000 UNIT/ML suspension Take 5 mLs (500,000 Units total) by mouth 4 (four) times daily. 120 mL 0   pantoprazole (PROTONIX) 40 MG tablet Take 40 mg by mouth daily.     predniSONE (DELTASONE) 5 MG tablet TAKE 4 TABLETS BY MOUTH DAILY FOR 5 DAYS, 2 TABS DAILY FOR 5 DAYS, THEN 1 TAB DAILY UNTIL FOLLOW UP 90 tablet 0   propranolol ER (INDERAL LA) 120 MG 24 hr capsule Take 120 mg by mouth daily.  3   TRELEGY ELLIPTA 200-62.5-25 MCG/ACT AEPB INHALE 1 PUFF BY MOUTH EVERY DAY 60 each 3   No current facility-administered medications for this visit.    REVIEW OF SYSTEMS:  [X]  denotes positive  finding, [ ]  denotes negative finding Cardiac  Comments:  Chest pain or chest pressure:    Shortness of breath upon exertion:    Short of breath when lying flat:    Irregular heart rhythm:        Vascular    Pain in calf, thigh, or hip brought on by ambulation:    Pain in feet at night that wakes you up from your sleep:     Blood clot in your veins:    Leg swelling:         Pulmonary    Oxygen at home:    Productive cough:     Wheezing:         Neurologic    Sudden weakness in arms or legs:     Sudden numbness in arms or legs:     Sudden onset of difficulty speaking or slurred speech:    Temporary loss of vision in one eye:     Problems with dizziness:         Gastrointestinal    Blood in stool:     Vomited blood:         Genitourinary    Burning when urinating:     Blood in urine:        Psychiatric    Major depression:         Hematologic    Bleeding problems:    Problems with blood clotting too easily:        Skin    Rashes or ulcers:        Constitutional    Fever or chills:      PHYSICAL EXAM: There were no vitals filed for this visit.    GENERAL: The patient is a well-nourished female, in no acute distress. The vital signs are documented above. CARDIAC: There is a regular rate and rhythm.   VASCULAR:  Bilateral femoral pulses palpable No palpable pedal pulses, no active tissue loss Bilateral DP signals monophasic   DATA:   Mesenteric duplex today again shows high grade celiac and SMA stenosis today with velocity 603/79 celiac and 360/31 SMA  (mesenteric duplex 02/16/23 shows high-grade stenosis in celiac stent with velocity 567/166 and high-grade stenosis in SMA with velocity 554/72)  Assessment/Plan: 89 y.o. female well-known to vascular surgery that presents for 3 month interval follow-up for surveillance of her mesenteric artery disease and ongoing surveillance of her celiac stent with concern for in-stent stenosis.   She initially underwent angioplasty and stent of her celiac artery on 05/15/2020 for mesenteric ischemia with chronic  gastritis.  There was also question of an SMA stenosis that was widely patent on angiogram and no intervention was performed on the SMA.  She had complication of closure device in the right groin requiring common femoral endarterectomy.  She then underwent celiac stent angioplasty on 05/15/2021 for an in-stent stenosis.  We used a Celt closure device in the left common femoral artery and ultimately she had a complication of the closure device and developed a left common femoral occlusion.  She subsequently underwent a left common femoral endarterectomy with bovine patch with findings of a dissection on 05/20/2021.     Cephus Shelling, MD [facility] Office: [phone]

## 2024-02-22 ENCOUNTER — Ambulatory Visit: Payer: Medicare Other | Admitting: Vascular Surgery

## 2024-02-23 ENCOUNTER — Other Ambulatory Visit: Payer: Self-pay | Admitting: *Deleted

## 2024-02-23 DIAGNOSIS — I739 Peripheral vascular disease, unspecified: Secondary | ICD-10-CM

## 2024-02-23 DIAGNOSIS — I70229 Atherosclerosis of native arteries of extremities with rest pain, unspecified extremity: Secondary | ICD-10-CM

## 2024-03-01 DIAGNOSIS — D509 Iron deficiency anemia, unspecified: Secondary | ICD-10-CM | POA: Diagnosis not present

## 2024-03-01 DIAGNOSIS — G43909 Migraine, unspecified, not intractable, without status migrainosus: Secondary | ICD-10-CM | POA: Diagnosis not present

## 2024-03-01 DIAGNOSIS — J441 Chronic obstructive pulmonary disease with (acute) exacerbation: Secondary | ICD-10-CM | POA: Diagnosis not present

## 2024-03-01 DIAGNOSIS — I10 Essential (primary) hypertension: Secondary | ICD-10-CM | POA: Diagnosis not present

## 2024-03-01 DIAGNOSIS — I739 Peripheral vascular disease, unspecified: Secondary | ICD-10-CM | POA: Diagnosis not present

## 2024-03-01 DIAGNOSIS — I701 Atherosclerosis of renal artery: Secondary | ICD-10-CM | POA: Diagnosis not present

## 2024-03-02 DIAGNOSIS — Z7982 Long term (current) use of aspirin: Secondary | ICD-10-CM | POA: Diagnosis not present

## 2024-03-02 DIAGNOSIS — M5416 Radiculopathy, lumbar region: Secondary | ICD-10-CM | POA: Diagnosis not present

## 2024-03-02 DIAGNOSIS — H269 Unspecified cataract: Secondary | ICD-10-CM | POA: Diagnosis not present

## 2024-03-02 DIAGNOSIS — Z8673 Personal history of transient ischemic attack (TIA), and cerebral infarction without residual deficits: Secondary | ICD-10-CM | POA: Diagnosis not present

## 2024-03-02 DIAGNOSIS — E781 Pure hyperglyceridemia: Secondary | ICD-10-CM | POA: Diagnosis not present

## 2024-03-02 DIAGNOSIS — J441 Chronic obstructive pulmonary disease with (acute) exacerbation: Secondary | ICD-10-CM | POA: Diagnosis not present

## 2024-03-02 DIAGNOSIS — Z8701 Personal history of pneumonia (recurrent): Secondary | ICD-10-CM | POA: Diagnosis not present

## 2024-03-02 DIAGNOSIS — I10 Essential (primary) hypertension: Secondary | ICD-10-CM | POA: Diagnosis not present

## 2024-03-02 DIAGNOSIS — Z9981 Dependence on supplemental oxygen: Secondary | ICD-10-CM | POA: Diagnosis not present

## 2024-03-02 DIAGNOSIS — E785 Hyperlipidemia, unspecified: Secondary | ICD-10-CM | POA: Diagnosis not present

## 2024-03-02 DIAGNOSIS — G43909 Migraine, unspecified, not intractable, without status migrainosus: Secondary | ICD-10-CM | POA: Diagnosis not present

## 2024-03-02 DIAGNOSIS — Z7902 Long term (current) use of antithrombotics/antiplatelets: Secondary | ICD-10-CM | POA: Diagnosis not present

## 2024-03-02 DIAGNOSIS — Z7951 Long term (current) use of inhaled steroids: Secondary | ICD-10-CM | POA: Diagnosis not present

## 2024-03-02 DIAGNOSIS — K219 Gastro-esophageal reflux disease without esophagitis: Secondary | ICD-10-CM | POA: Diagnosis not present

## 2024-03-02 DIAGNOSIS — M1990 Unspecified osteoarthritis, unspecified site: Secondary | ICD-10-CM | POA: Diagnosis not present

## 2024-03-02 DIAGNOSIS — K649 Unspecified hemorrhoids: Secondary | ICD-10-CM | POA: Diagnosis not present

## 2024-03-02 DIAGNOSIS — K5900 Constipation, unspecified: Secondary | ICD-10-CM | POA: Diagnosis not present

## 2024-03-02 DIAGNOSIS — D509 Iron deficiency anemia, unspecified: Secondary | ICD-10-CM | POA: Diagnosis not present

## 2024-03-02 DIAGNOSIS — E43 Unspecified severe protein-calorie malnutrition: Secondary | ICD-10-CM | POA: Diagnosis not present

## 2024-03-02 DIAGNOSIS — Z681 Body mass index (BMI) 19 or less, adult: Secondary | ICD-10-CM | POA: Diagnosis not present

## 2024-03-02 DIAGNOSIS — I701 Atherosclerosis of renal artery: Secondary | ICD-10-CM | POA: Diagnosis not present

## 2024-03-02 DIAGNOSIS — I739 Peripheral vascular disease, unspecified: Secondary | ICD-10-CM | POA: Diagnosis not present

## 2024-03-02 DIAGNOSIS — Z87891 Personal history of nicotine dependence: Secondary | ICD-10-CM | POA: Diagnosis not present

## 2024-03-02 DIAGNOSIS — K5790 Diverticulosis of intestine, part unspecified, without perforation or abscess without bleeding: Secondary | ICD-10-CM | POA: Diagnosis not present

## 2024-03-02 DIAGNOSIS — R32 Unspecified urinary incontinence: Secondary | ICD-10-CM | POA: Diagnosis not present

## 2024-03-03 DIAGNOSIS — N1831 Chronic kidney disease, stage 3a: Secondary | ICD-10-CM | POA: Diagnosis not present

## 2024-03-03 DIAGNOSIS — M810 Age-related osteoporosis without current pathological fracture: Secondary | ICD-10-CM | POA: Diagnosis not present

## 2024-03-03 DIAGNOSIS — M5416 Radiculopathy, lumbar region: Secondary | ICD-10-CM | POA: Diagnosis not present

## 2024-03-03 DIAGNOSIS — I129 Hypertensive chronic kidney disease with stage 1 through stage 4 chronic kidney disease, or unspecified chronic kidney disease: Secondary | ICD-10-CM | POA: Diagnosis not present

## 2024-03-03 DIAGNOSIS — F331 Major depressive disorder, recurrent, moderate: Secondary | ICD-10-CM | POA: Diagnosis not present

## 2024-03-03 DIAGNOSIS — E785 Hyperlipidemia, unspecified: Secondary | ICD-10-CM | POA: Diagnosis not present

## 2024-03-03 DIAGNOSIS — J432 Centrilobular emphysema: Secondary | ICD-10-CM | POA: Diagnosis not present

## 2024-03-03 DIAGNOSIS — Z95820 Peripheral vascular angioplasty status with implants and grafts: Secondary | ICD-10-CM | POA: Diagnosis not present

## 2024-03-03 DIAGNOSIS — E440 Moderate protein-calorie malnutrition: Secondary | ICD-10-CM | POA: Diagnosis not present

## 2024-03-03 DIAGNOSIS — I774 Celiac artery compression syndrome: Secondary | ICD-10-CM | POA: Diagnosis not present

## 2024-03-03 DIAGNOSIS — R7301 Impaired fasting glucose: Secondary | ICD-10-CM | POA: Diagnosis not present

## 2024-03-03 DIAGNOSIS — S20211A Contusion of right front wall of thorax, initial encounter: Secondary | ICD-10-CM | POA: Diagnosis not present

## 2024-03-11 ENCOUNTER — Other Ambulatory Visit: Payer: Self-pay | Admitting: Pulmonary Disease

## 2024-03-14 ENCOUNTER — Ambulatory Visit (HOSPITAL_COMMUNITY): Admission: RE | Admit: 2024-03-14 | Source: Ambulatory Visit

## 2024-03-14 ENCOUNTER — Ambulatory Visit: Admitting: Vascular Surgery

## 2024-03-16 ENCOUNTER — Ambulatory Visit: Admitting: Gastroenterology

## 2024-03-17 DIAGNOSIS — J441 Chronic obstructive pulmonary disease with (acute) exacerbation: Secondary | ICD-10-CM | POA: Diagnosis not present

## 2024-03-17 DIAGNOSIS — I739 Peripheral vascular disease, unspecified: Secondary | ICD-10-CM | POA: Diagnosis not present

## 2024-03-17 DIAGNOSIS — I10 Essential (primary) hypertension: Secondary | ICD-10-CM | POA: Diagnosis not present

## 2024-03-17 DIAGNOSIS — D509 Iron deficiency anemia, unspecified: Secondary | ICD-10-CM | POA: Diagnosis not present

## 2024-03-17 DIAGNOSIS — I701 Atherosclerosis of renal artery: Secondary | ICD-10-CM | POA: Diagnosis not present

## 2024-03-17 DIAGNOSIS — G43909 Migraine, unspecified, not intractable, without status migrainosus: Secondary | ICD-10-CM | POA: Diagnosis not present

## 2024-03-20 ENCOUNTER — Other Ambulatory Visit (HOSPITAL_COMMUNITY): Payer: Self-pay

## 2024-03-22 DIAGNOSIS — I701 Atherosclerosis of renal artery: Secondary | ICD-10-CM | POA: Diagnosis not present

## 2024-03-22 DIAGNOSIS — I739 Peripheral vascular disease, unspecified: Secondary | ICD-10-CM | POA: Diagnosis not present

## 2024-03-22 DIAGNOSIS — J441 Chronic obstructive pulmonary disease with (acute) exacerbation: Secondary | ICD-10-CM | POA: Diagnosis not present

## 2024-03-22 DIAGNOSIS — D509 Iron deficiency anemia, unspecified: Secondary | ICD-10-CM | POA: Diagnosis not present

## 2024-03-22 DIAGNOSIS — G43909 Migraine, unspecified, not intractable, without status migrainosus: Secondary | ICD-10-CM | POA: Diagnosis not present

## 2024-03-22 DIAGNOSIS — I10 Essential (primary) hypertension: Secondary | ICD-10-CM | POA: Diagnosis not present

## 2024-03-24 DIAGNOSIS — I10 Essential (primary) hypertension: Secondary | ICD-10-CM | POA: Diagnosis not present

## 2024-03-24 DIAGNOSIS — J441 Chronic obstructive pulmonary disease with (acute) exacerbation: Secondary | ICD-10-CM | POA: Diagnosis not present

## 2024-03-24 DIAGNOSIS — I739 Peripheral vascular disease, unspecified: Secondary | ICD-10-CM | POA: Diagnosis not present

## 2024-03-24 DIAGNOSIS — I701 Atherosclerosis of renal artery: Secondary | ICD-10-CM | POA: Diagnosis not present

## 2024-03-24 DIAGNOSIS — D509 Iron deficiency anemia, unspecified: Secondary | ICD-10-CM | POA: Diagnosis not present

## 2024-03-24 DIAGNOSIS — G43909 Migraine, unspecified, not intractable, without status migrainosus: Secondary | ICD-10-CM | POA: Diagnosis not present

## 2024-03-27 ENCOUNTER — Ambulatory Visit: Payer: Self-pay

## 2024-03-27 ENCOUNTER — Ambulatory Visit: Payer: Medicare Other | Admitting: Pulmonary Disease

## 2024-03-27 NOTE — Telephone Encounter (Signed)
 Recommend patient use albuterol  nebulizer treatment 20-30 minutes prior to taking her daily trelegy puff.   If she is having increasing wheezing/cough/mucous production then we can send in steroid taper and antibiotic for concern of COPD exacerbation.  Otherwise it would have been best for her to keep her appointment today for evaluation.   If she continues to feel bad, recommend going to the ER.  Dr. Diania Fortes

## 2024-03-27 NOTE — Telephone Encounter (Signed)
 Pt reports worsening SOB this AM, notes she feels too weak to got appt, pt has used medications bot maintenance and PRN as prescribed with little relief. This RN advised ED, pt requests medication adjustment, advised eval/treat needed. Pt/daughter agreeable. This RN educated pt on home care, new-worsening symptoms, when to call back/seek emergent care. Pt verbalized understanding and agrees to plan.   E2C2 Pulmonary Triage - Initial Assessment Questions "Chief Complaint (e.g., cough, sob, wheezing, fever, chills, sweat or additional symptoms) *Go to specific symptom protocol after initial questions. SOB  "How long have symptoms been present?" Today  Have you tested for COVID or Flu? Note: If not, ask patient if a home test can be taken. If so, instruct patient to call back for positive results. No  MEDICINES:   "Have you used any OTC meds to help with symptoms?" No If yes, ask "What medications?" None  "Have you used your inhalers/maintenance medication?" Yes If yes, "What medications?" Trellegy Albuterol  Prednisone   If inhaler, ask "How many puffs and how often?" Note: Review instructions on medication in the chart. As prescribed  OXYGEN : "Do you wear supplemental oxygen ?" Yes If yes, "How many liters are you supposed to use?" 2 LPM  "Do you monitor your oxygen  levels?" Yes If yes, "What is your reading (oxygen  level) today?" 93%  "What is your usual oxygen  saturation reading?"  (Note: Pulmonary O2 sats should be 90% or greater) Low 90s     Copied from CRM #[id number]. Topic: Clinical - Red Word Triage >> Mar 27, 2024 10:39 AM Ambrose Junk wrote: [facility] that prompted transfer to Nurse Triage: [facility] Breathing/Weakness Reason for Disposition  [1] MODERATE difficulty breathing (e.g., speaks in phrases, SOB even at rest, pulse 100-120) AND [2] NEW-onset or WORSE than normal  Answer Assessment - Initial Assessment Questions 1. RESPIRATORY STATUS: "Describe your breathing?"  (e.g., wheezing, shortness of breath, unable to speak, severe coughing)      Shortness of Breath 2. ONSET: "When did this breathing problem begin?"      This AM 3. PATTERN "Does the difficult breathing come and go, or has it been constant since it started?"      Constant 4. SEVERITY: "How bad is your breathing?" (e.g., mild, moderate, severe)    - MILD: No SOB at rest, mild SOB with walking, speaks normally in sentences, can lie down, no retractions, pulse < 100.    - MODERATE: SOB at rest, SOB with minimal exertion and prefers to sit, cannot lie down flat, speaks in phrases, mild retractions, audible wheezing, pulse 100-120.    - SEVERE: Very SOB at rest, speaks in single words, struggling to breathe, sitting hunched forward, retractions, pulse > 120      Moderate 8. CAUSE: "What do you think is causing the breathing problem?"      Exacerbation 9. OTHER SYMPTOMS: "Do you have any other symptoms? (e.g., dizziness, runny nose, cough, chest pain, fever)     None 10. O2 SATURATION MONITOR:  "Do you use an oxygen  saturation monitor (pulse oximeter) at home?" If Yes, ask: "What is your reading (oxygen  level) today?" "What is your usual oxygen  saturation reading?" (e.g., 95%)       93%  Protocols used: Breathing Difficulty-A-AH

## 2024-03-27 NOTE — Telephone Encounter (Signed)
 ATC, LMOVM for pt to return call

## 2024-03-27 NOTE — Telephone Encounter (Signed)
 She had an apt with Dr. Diania Fortes scheduled for today and cancelled, I will forward to him seeing as he is her primary pulmonologist

## 2024-03-28 DIAGNOSIS — I701 Atherosclerosis of renal artery: Secondary | ICD-10-CM | POA: Diagnosis not present

## 2024-03-28 DIAGNOSIS — D509 Iron deficiency anemia, unspecified: Secondary | ICD-10-CM | POA: Diagnosis not present

## 2024-03-28 DIAGNOSIS — J441 Chronic obstructive pulmonary disease with (acute) exacerbation: Secondary | ICD-10-CM | POA: Diagnosis not present

## 2024-03-28 DIAGNOSIS — I10 Essential (primary) hypertension: Secondary | ICD-10-CM | POA: Diagnosis not present

## 2024-03-28 DIAGNOSIS — I739 Peripheral vascular disease, unspecified: Secondary | ICD-10-CM | POA: Diagnosis not present

## 2024-03-28 DIAGNOSIS — G43909 Migraine, unspecified, not intractable, without status migrainosus: Secondary | ICD-10-CM | POA: Diagnosis not present

## 2024-03-28 NOTE — Telephone Encounter (Signed)
 Called and spoke with patient, provided recommendations per Dr. Diania Fortes.  She verbalized understanding.  She stated she has some mucous, however, it is not green or yellow, it is grey.  She will try the albuterol  nebulizer solution prior to her Trelegy inhaler and see if that helps.  She had been using the albuterol  nebulizer after the trelegy.  She will call back if she starts to cough up colored mucous (yellow or green) or continues to have wheezing or coughing.  Nothing further needed.

## 2024-03-30 DIAGNOSIS — G43909 Migraine, unspecified, not intractable, without status migrainosus: Secondary | ICD-10-CM | POA: Diagnosis not present

## 2024-03-30 DIAGNOSIS — J441 Chronic obstructive pulmonary disease with (acute) exacerbation: Secondary | ICD-10-CM | POA: Diagnosis not present

## 2024-03-30 DIAGNOSIS — I739 Peripheral vascular disease, unspecified: Secondary | ICD-10-CM | POA: Diagnosis not present

## 2024-03-30 DIAGNOSIS — I10 Essential (primary) hypertension: Secondary | ICD-10-CM | POA: Diagnosis not present

## 2024-03-30 DIAGNOSIS — D509 Iron deficiency anemia, unspecified: Secondary | ICD-10-CM | POA: Diagnosis not present

## 2024-03-30 DIAGNOSIS — I701 Atherosclerosis of renal artery: Secondary | ICD-10-CM | POA: Diagnosis not present

## 2024-03-31 ENCOUNTER — Other Ambulatory Visit: Payer: Self-pay | Admitting: Primary Care

## 2024-03-31 NOTE — Telephone Encounter (Signed)
 Copied from CRM [id number]. Topic: Clinical - Medication Refill >> Mar 31, 2024  1:02 PM Alverda Joe S wrote: Most Recent Primary Care Visit:   Medication: predniSONE  (DELTASONE )   Has the patient contacted their pharmacy? Yes (Agent: If no, request that the patient contact the pharmacy for the refill. If patient does not wish to contact the pharmacy document the reason why and proceed with request.) (Agent: If yes, when and what did the pharmacy advise?)  Is this the correct pharmacy for this prescription? Yes If no, delete pharmacy and type the correct one.  This is the patient's preferred pharmacy:  [facility] [phone] [name], [place] - [address] [place] [address] Phone: [phone] Fax: [phone]  [facility] [address] [place] [address] Phone: [phone] Fax: [phone]   Has the prescription been filled recently? No  Is the patient out of the medication? Yes  Has the patient been seen for an appointment in the last year OR does the patient have an upcoming appointment? Yes  Can we respond through MyChart? No  Agent: Please be advised that Rx refills may take up to 3 business days. We ask that you follow-up with your pharmacy.

## 2024-04-01 DIAGNOSIS — Z8701 Personal history of pneumonia (recurrent): Secondary | ICD-10-CM | POA: Diagnosis not present

## 2024-04-01 DIAGNOSIS — I739 Peripheral vascular disease, unspecified: Secondary | ICD-10-CM | POA: Diagnosis not present

## 2024-04-01 DIAGNOSIS — M1990 Unspecified osteoarthritis, unspecified site: Secondary | ICD-10-CM | POA: Diagnosis not present

## 2024-04-01 DIAGNOSIS — J441 Chronic obstructive pulmonary disease with (acute) exacerbation: Secondary | ICD-10-CM | POA: Diagnosis not present

## 2024-04-01 DIAGNOSIS — I10 Essential (primary) hypertension: Secondary | ICD-10-CM | POA: Diagnosis not present

## 2024-04-01 DIAGNOSIS — K219 Gastro-esophageal reflux disease without esophagitis: Secondary | ICD-10-CM | POA: Diagnosis not present

## 2024-04-01 DIAGNOSIS — Z7951 Long term (current) use of inhaled steroids: Secondary | ICD-10-CM | POA: Diagnosis not present

## 2024-04-01 DIAGNOSIS — M5416 Radiculopathy, lumbar region: Secondary | ICD-10-CM | POA: Diagnosis not present

## 2024-04-01 DIAGNOSIS — I701 Atherosclerosis of renal artery: Secondary | ICD-10-CM | POA: Diagnosis not present

## 2024-04-01 DIAGNOSIS — H269 Unspecified cataract: Secondary | ICD-10-CM | POA: Diagnosis not present

## 2024-04-01 DIAGNOSIS — D509 Iron deficiency anemia, unspecified: Secondary | ICD-10-CM | POA: Diagnosis not present

## 2024-04-01 DIAGNOSIS — Z9981 Dependence on supplemental oxygen: Secondary | ICD-10-CM | POA: Diagnosis not present

## 2024-04-01 DIAGNOSIS — Z8673 Personal history of transient ischemic attack (TIA), and cerebral infarction without residual deficits: Secondary | ICD-10-CM | POA: Diagnosis not present

## 2024-04-01 DIAGNOSIS — E785 Hyperlipidemia, unspecified: Secondary | ICD-10-CM | POA: Diagnosis not present

## 2024-04-01 DIAGNOSIS — K649 Unspecified hemorrhoids: Secondary | ICD-10-CM | POA: Diagnosis not present

## 2024-04-01 DIAGNOSIS — Z7902 Long term (current) use of antithrombotics/antiplatelets: Secondary | ICD-10-CM | POA: Diagnosis not present

## 2024-04-01 DIAGNOSIS — K5790 Diverticulosis of intestine, part unspecified, without perforation or abscess without bleeding: Secondary | ICD-10-CM | POA: Diagnosis not present

## 2024-04-01 DIAGNOSIS — Z681 Body mass index (BMI) 19 or less, adult: Secondary | ICD-10-CM | POA: Diagnosis not present

## 2024-04-01 DIAGNOSIS — R32 Unspecified urinary incontinence: Secondary | ICD-10-CM | POA: Diagnosis not present

## 2024-04-01 DIAGNOSIS — Z7982 Long term (current) use of aspirin: Secondary | ICD-10-CM | POA: Diagnosis not present

## 2024-04-01 DIAGNOSIS — G43909 Migraine, unspecified, not intractable, without status migrainosus: Secondary | ICD-10-CM | POA: Diagnosis not present

## 2024-04-01 DIAGNOSIS — E43 Unspecified severe protein-calorie malnutrition: Secondary | ICD-10-CM | POA: Diagnosis not present

## 2024-04-01 DIAGNOSIS — E781 Pure hyperglyceridemia: Secondary | ICD-10-CM | POA: Diagnosis not present

## 2024-04-01 DIAGNOSIS — Z87891 Personal history of nicotine dependence: Secondary | ICD-10-CM | POA: Diagnosis not present

## 2024-04-01 DIAGNOSIS — K5900 Constipation, unspecified: Secondary | ICD-10-CM | POA: Diagnosis not present

## 2024-04-04 ENCOUNTER — Telehealth: Payer: Self-pay

## 2024-04-04 DIAGNOSIS — T402X5D Adverse effect of other opioids, subsequent encounter: Secondary | ICD-10-CM | POA: Diagnosis not present

## 2024-04-04 DIAGNOSIS — J441 Chronic obstructive pulmonary disease with (acute) exacerbation: Secondary | ICD-10-CM

## 2024-04-04 DIAGNOSIS — M5416 Radiculopathy, lumbar region: Secondary | ICD-10-CM | POA: Diagnosis not present

## 2024-04-04 DIAGNOSIS — M5459 Other low back pain: Secondary | ICD-10-CM | POA: Diagnosis not present

## 2024-04-04 DIAGNOSIS — I739 Peripheral vascular disease, unspecified: Secondary | ICD-10-CM | POA: Diagnosis not present

## 2024-04-04 DIAGNOSIS — Z79899 Other long term (current) drug therapy: Secondary | ICD-10-CM | POA: Diagnosis not present

## 2024-04-04 DIAGNOSIS — Z79891 Long term (current) use of opiate analgesic: Secondary | ICD-10-CM | POA: Diagnosis not present

## 2024-04-04 MED ORDER — AZITHROMYCIN 250 MG PO TABS: ORAL_TABLET | ORAL | 0 refills | Status: DC

## 2024-04-04 MED ORDER — PREDNISONE 10 MG PO TABS: ORAL_TABLET | ORAL | 0 refills | Status: AC

## 2024-04-04 NOTE — Telephone Encounter (Signed)
 Please schedule patient for follow up with me. Can use a blocked slot if needed.  I will send in steroid taper and Zpak for her cough/mucous production.  Continue albuterol  nebulizer prior to taking your trelegy inhaler.  We may need to consider swtiching her to an all nebulizer regimen from the trelegy inhaler. Will determine this at follow up.  JD

## 2024-04-04 NOTE — Telephone Encounter (Signed)
 Got pt scheduled tomorrow at 4:00 with Dewald.

## 2024-04-04 NOTE — Telephone Encounter (Signed)
 Please advise, pt was given recommendations last week but sees no improvement in chest congestion with changes given

## 2024-04-04 NOTE — Telephone Encounter (Signed)
 Copied from CRM [id number]. Topic: Clinical - Medical Advice >> Mar 30, 2024  3:35 PM Crist Dominion wrote: Reason for CRM: Patient states she spoke with nurse Pattie Borders this week about changing her routine with using her Trelegy and nebulizer, and while that did help her chest some, patient would like to discuss the gradual treatment of prednisone  and antibiotics again. Please call patient back when available to do so. >> Apr 03, 2024 10:34 AM Crist Dominion wrote: Patient is requesting an update regarding this matter as she states no one has tried to contact her.

## 2024-04-05 ENCOUNTER — Encounter: Payer: Self-pay | Admitting: Pulmonary Disease

## 2024-04-05 ENCOUNTER — Ambulatory Visit (INDEPENDENT_AMBULATORY_CARE_PROVIDER_SITE_OTHER): Admitting: Pulmonary Disease

## 2024-04-05 VITALS — BP 124/55 | HR 64 | Ht 61.0 in

## 2024-04-05 DIAGNOSIS — J441 Chronic obstructive pulmonary disease with (acute) exacerbation: Secondary | ICD-10-CM | POA: Diagnosis not present

## 2024-04-05 MED ORDER — REVEFENACIN 175 MCG/3ML IN SOLN: 175.0000 ug | Freq: Every day | RESPIRATORY_TRACT | 3 refills | Status: DC

## 2024-04-05 MED ORDER — BUDESONIDE 0.5 MG/2ML IN SUSP: 0.5000 mg | Freq: Two times a day (BID) | RESPIRATORY_TRACT | 3 refills | Status: DC

## 2024-04-05 MED ORDER — ARFORMOTEROL TARTRATE 15 MCG/2ML IN NEBU: 15.0000 ug | INHALATION_SOLUTION | Freq: Two times a day (BID) | RESPIRATORY_TRACT | 3 refills | Status: DC

## 2024-04-05 NOTE — Patient Instructions (Addendum)
 Stop trelegy inhaler once your new nebulizer medicines arrive  Morning Nebulizer regimen - Yupelri - Budesonide  - Brovana  Evening Nebulizer regimen - budesonide  - brovana  Use albuterol  inhaler or nebulizer treatments as needed every 4-6 hours as needed  Complete your steroid course and Zpak recently sent in  We will send in a new order for a portable oxygen  concentrator to [facility] for 2-3L of pulsed oxygen   Follow up in 2 months, call sooner if needed
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
## 2024-04-09 ENCOUNTER — Encounter: Payer: Self-pay | Admitting: Pulmonary Disease

## 2024-04-10 DIAGNOSIS — K649 Unspecified hemorrhoids: Secondary | ICD-10-CM | POA: Diagnosis not present

## 2024-04-10 DIAGNOSIS — D509 Iron deficiency anemia, unspecified: Secondary | ICD-10-CM | POA: Diagnosis not present

## 2024-04-10 DIAGNOSIS — I10 Essential (primary) hypertension: Secondary | ICD-10-CM | POA: Diagnosis not present

## 2024-04-10 DIAGNOSIS — I701 Atherosclerosis of renal artery: Secondary | ICD-10-CM | POA: Diagnosis not present

## 2024-04-10 DIAGNOSIS — M1990 Unspecified osteoarthritis, unspecified site: Secondary | ICD-10-CM | POA: Diagnosis not present

## 2024-04-10 DIAGNOSIS — K5790 Diverticulosis of intestine, part unspecified, without perforation or abscess without bleeding: Secondary | ICD-10-CM | POA: Diagnosis not present

## 2024-04-10 DIAGNOSIS — G43909 Migraine, unspecified, not intractable, without status migrainosus: Secondary | ICD-10-CM | POA: Diagnosis not present

## 2024-04-10 DIAGNOSIS — E781 Pure hyperglyceridemia: Secondary | ICD-10-CM | POA: Diagnosis not present

## 2024-04-10 DIAGNOSIS — I739 Peripheral vascular disease, unspecified: Secondary | ICD-10-CM | POA: Diagnosis not present

## 2024-04-10 DIAGNOSIS — H269 Unspecified cataract: Secondary | ICD-10-CM | POA: Diagnosis not present

## 2024-04-10 DIAGNOSIS — M5416 Radiculopathy, lumbar region: Secondary | ICD-10-CM | POA: Diagnosis not present

## 2024-04-10 DIAGNOSIS — J441 Chronic obstructive pulmonary disease with (acute) exacerbation: Secondary | ICD-10-CM | POA: Diagnosis not present

## 2024-04-12 DIAGNOSIS — I701 Atherosclerosis of renal artery: Secondary | ICD-10-CM | POA: Diagnosis not present

## 2024-04-12 DIAGNOSIS — D509 Iron deficiency anemia, unspecified: Secondary | ICD-10-CM | POA: Diagnosis not present

## 2024-04-12 DIAGNOSIS — J441 Chronic obstructive pulmonary disease with (acute) exacerbation: Secondary | ICD-10-CM | POA: Diagnosis not present

## 2024-04-12 DIAGNOSIS — I10 Essential (primary) hypertension: Secondary | ICD-10-CM | POA: Diagnosis not present

## 2024-04-12 DIAGNOSIS — G43909 Migraine, unspecified, not intractable, without status migrainosus: Secondary | ICD-10-CM | POA: Diagnosis not present

## 2024-04-12 DIAGNOSIS — I739 Peripheral vascular disease, unspecified: Secondary | ICD-10-CM | POA: Diagnosis not present

## 2024-04-13 DIAGNOSIS — Z79899 Other long term (current) drug therapy: Secondary | ICD-10-CM | POA: Diagnosis not present

## 2024-04-18 ENCOUNTER — Other Ambulatory Visit: Payer: Self-pay | Admitting: Primary Care

## 2024-04-18 ENCOUNTER — Telehealth: Payer: Self-pay

## 2024-04-18 NOTE — Telephone Encounter (Deleted)
 Copied from CRM [id number]. Topic: Clinical - Order For Equipment >> Apr 17, 2024  1:40 PM Chantha C wrote: Reason for CRM: Patient is checking on status of portable oxygen  concentator to [facility] and when is to expecting to hear or received portable oxygen  concentrator? Provided [facility] phone #[phone]. However patient wants to hear from the nurse, please call back.

## 2024-04-18 NOTE — Telephone Encounter (Signed)
 Reason for CRM: Patient is checking on status of portable oxygen  concentator to [facility] and when is to expecting to hear or received portable oxygen  concentrator? Provided [facility] phone #[phone]. However patient wants to hear from the nurse, please call back    O2 order was placed on 5/7 and still shows pending. Routing to [facility], please advise

## 2024-04-18 NOTE — Telephone Encounter (Signed)
 Order was faxed this morning to [facility] as urgent

## 2024-04-19 ENCOUNTER — Telehealth: Payer: Self-pay | Admitting: *Deleted

## 2024-04-19 DIAGNOSIS — G43909 Migraine, unspecified, not intractable, without status migrainosus: Secondary | ICD-10-CM | POA: Diagnosis not present

## 2024-04-19 DIAGNOSIS — I701 Atherosclerosis of renal artery: Secondary | ICD-10-CM | POA: Diagnosis not present

## 2024-04-19 DIAGNOSIS — D509 Iron deficiency anemia, unspecified: Secondary | ICD-10-CM | POA: Diagnosis not present

## 2024-04-19 DIAGNOSIS — J441 Chronic obstructive pulmonary disease with (acute) exacerbation: Secondary | ICD-10-CM | POA: Diagnosis not present

## 2024-04-19 DIAGNOSIS — I10 Essential (primary) hypertension: Secondary | ICD-10-CM | POA: Diagnosis not present

## 2024-04-19 DIAGNOSIS — I739 Peripheral vascular disease, unspecified: Secondary | ICD-10-CM | POA: Diagnosis not present

## 2024-04-19 NOTE — Telephone Encounter (Signed)
 Please advise if refill is appropriate

## 2024-04-19 NOTE — Telephone Encounter (Signed)
 Per Dr. Reine Caraway note it says to continue prednisone  taper as prescribed, he needs to clarify if she is to stay on medication

## 2024-04-19 NOTE — Telephone Encounter (Signed)
 Denied. This was a one time medication. If having acute symptoms needs visit

## 2024-04-19 NOTE — Telephone Encounter (Signed)
 Copied from CRM [id number]. Topic: Clinical - Prescription Issue >> Apr 19, 2024  3:11 PM Alverda Joe S wrote: Reason for CRM: patient would like to speak to nurse about prednisone  denial, please call patient, she says she needs the medicine.  Called and spoke with patient, she states that when she saw Dr. Diania Fortes on 5/7, her understanding was that after she finished the taper of prednisone  that she would start a daily dose of prednisone .  She said when she called in the refill it was denied and she wanted to know why it was denied.  I asked if she was better since she completed the prednisone  and antibiotic and she stated yes.  I advised her that based on Dr. Reine Caraway last note the plan was to "Evaluate need for daily prednisone  maintenance post-taper and new nebulizers.  She states she received the nebulizer medications yesterday.  I advised her to use the nebulizers as instructed by Dr. Diania Fortes and call the office if things are getting worse and not better.  She verbalized understanding.  She has a f/u on 05/30/24 with Eulas Hick NP.

## 2024-04-25 ENCOUNTER — Ambulatory Visit (INDEPENDENT_AMBULATORY_CARE_PROVIDER_SITE_OTHER): Admitting: Vascular Surgery

## 2024-04-25 ENCOUNTER — Ambulatory Visit (HOSPITAL_COMMUNITY)
Admission: RE | Admit: 2024-04-25 | Discharge: 2024-04-25 | Disposition: A | Discharge status: Home / Self Care | Source: Ambulatory Visit | Attending: Vascular Surgery | Admitting: Vascular Surgery

## 2024-04-25 ENCOUNTER — Ambulatory Visit (HOSPITAL_BASED_OUTPATIENT_CLINIC_OR_DEPARTMENT_OTHER)
Admission: RE | Admit: 2024-04-25 | Discharge: 2024-04-25 | Disposition: A | Discharge status: Home / Self Care | Source: Ambulatory Visit | Attending: Vascular Surgery | Admitting: Vascular Surgery

## 2024-04-25 ENCOUNTER — Encounter: Payer: Self-pay | Admitting: Vascular Surgery

## 2024-04-25 VITALS — BP 124/70 | HR 71 | Temp 97.7°F | Resp 22 | Ht 61.0 in | Wt 85.0 lb

## 2024-04-25 DIAGNOSIS — I70229 Atherosclerosis of native arteries of extremities with rest pain, unspecified extremity: Secondary | ICD-10-CM

## 2024-04-25 DIAGNOSIS — I70222 Atherosclerosis of native arteries of extremities with rest pain, left leg: Secondary | ICD-10-CM

## 2024-04-25 DIAGNOSIS — I739 Peripheral vascular disease, unspecified: Secondary | ICD-10-CM | POA: Diagnosis not present

## 2024-04-25 LAB — VAS US ABI WITH/WO TBI
Left ABI: 0.63
Right ABI: 0.84
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
## 2024-04-26 ENCOUNTER — Other Ambulatory Visit: Payer: Self-pay

## 2024-04-26 DIAGNOSIS — I70229 Atherosclerosis of native arteries of extremities with rest pain, unspecified extremity: Secondary | ICD-10-CM

## 2024-04-27 DIAGNOSIS — G43909 Migraine, unspecified, not intractable, without status migrainosus: Secondary | ICD-10-CM | POA: Diagnosis not present

## 2024-04-27 DIAGNOSIS — I10 Essential (primary) hypertension: Secondary | ICD-10-CM | POA: Diagnosis not present

## 2024-04-27 DIAGNOSIS — J441 Chronic obstructive pulmonary disease with (acute) exacerbation: Secondary | ICD-10-CM | POA: Diagnosis not present

## 2024-04-27 DIAGNOSIS — D509 Iron deficiency anemia, unspecified: Secondary | ICD-10-CM | POA: Diagnosis not present

## 2024-04-27 DIAGNOSIS — I739 Peripheral vascular disease, unspecified: Secondary | ICD-10-CM | POA: Diagnosis not present

## 2024-04-27 DIAGNOSIS — I701 Atherosclerosis of renal artery: Secondary | ICD-10-CM | POA: Diagnosis not present

## 2024-05-04 ENCOUNTER — Encounter (HOSPITAL_COMMUNITY): Payer: Self-pay | Admitting: Vascular Surgery

## 2024-05-04 ENCOUNTER — Ambulatory Visit (HOSPITAL_COMMUNITY)
Admission: RE | Admit: 2024-05-04 | Discharge: 2024-05-04 | Disposition: A | Discharge status: Home / Self Care | Attending: Vascular Surgery | Admitting: Vascular Surgery

## 2024-05-04 ENCOUNTER — Other Ambulatory Visit: Payer: Self-pay

## 2024-05-04 ENCOUNTER — Encounter (HOSPITAL_COMMUNITY)
Admission: RE | Disposition: A | Discharge status: Home / Self Care | Payer: Self-pay | Source: Home / Self Care | Attending: Vascular Surgery

## 2024-05-04 DIAGNOSIS — I70222 Atherosclerosis of native arteries of extremities with rest pain, left leg: Secondary | ICD-10-CM | POA: Diagnosis not present

## 2024-05-04 DIAGNOSIS — Z7902 Long term (current) use of antithrombotics/antiplatelets: Secondary | ICD-10-CM | POA: Insufficient documentation

## 2024-05-04 DIAGNOSIS — Z9981 Dependence on supplemental oxygen: Secondary | ICD-10-CM | POA: Insufficient documentation

## 2024-05-04 DIAGNOSIS — L97529 Non-pressure chronic ulcer of other part of left foot with unspecified severity: Secondary | ICD-10-CM | POA: Insufficient documentation

## 2024-05-04 DIAGNOSIS — I70245 Atherosclerosis of native arteries of left leg with ulceration of other part of foot: Secondary | ICD-10-CM | POA: Insufficient documentation

## 2024-05-04 DIAGNOSIS — Z79899 Other long term (current) drug therapy: Secondary | ICD-10-CM | POA: Insufficient documentation

## 2024-05-04 DIAGNOSIS — L97329 Non-pressure chronic ulcer of left ankle with unspecified severity: Secondary | ICD-10-CM | POA: Diagnosis not present

## 2024-05-04 DIAGNOSIS — Z7982 Long term (current) use of aspirin: Secondary | ICD-10-CM | POA: Diagnosis not present

## 2024-05-04 DIAGNOSIS — Z87891 Personal history of nicotine dependence: Secondary | ICD-10-CM | POA: Diagnosis not present

## 2024-05-04 DIAGNOSIS — I10 Essential (primary) hypertension: Secondary | ICD-10-CM | POA: Insufficient documentation

## 2024-05-04 DIAGNOSIS — I70229 Atherosclerosis of native arteries of extremities with rest pain, unspecified extremity: Secondary | ICD-10-CM

## 2024-05-04 HISTORY — PX: LOWER EXTREMITY INTERVENTION: CATH118252

## 2024-05-04 HISTORY — PX: LOWER EXTREMITY ANGIOGRAPHY: CATH118251

## 2024-05-04 HISTORY — PX: ABDOMINAL AORTOGRAM: CATH118222

## 2024-05-04 LAB — POCT I-STAT, CHEM 8
BUN: 22 mg/dL (ref 8–23)
Calcium, Ion: 1.14 mmol/L — ABNORMAL LOW (ref 1.15–1.40)
Chloride: 99 mmol/L (ref 98–111)
Creatinine, Ser: 0.8 mg/dL (ref 0.44–1.00)
Glucose, Bld: 109 mg/dL — ABNORMAL HIGH (ref 70–99)
HCT: 37 % (ref 36.0–46.0)
Hemoglobin: 12.6 g/dL (ref 12.0–15.0)
Potassium: 5.1 mmol/L (ref 3.5–5.1)
Sodium: 137 mmol/L (ref 135–145)
TCO2: 32 mmol/L (ref 22–32)

## 2024-05-04 LAB — POCT ACTIVATED CLOTTING TIME: Activated Clotting Time: 234 s

## 2024-05-04 SURGERY — ABDOMINAL AORTOGRAM
Anesthesia: LOCAL

## 2024-05-04 MED ORDER — FENTANYL CITRATE (PF) 100 MCG/2ML IJ SOLN
INTRAMUSCULAR | Status: AC
  Filled 2024-05-04: qty 2

## 2024-05-04 MED ORDER — OXYCODONE HCL 5 MG PO TABS
ORAL_TABLET | ORAL | Status: AC
  Administered 2024-05-04: 5 mg
  Filled 2024-05-04: qty 1

## 2024-05-04 MED ORDER — OXYCODONE HCL 5 MG PO TABS: 5.0000 mg | ORAL_TABLET | ORAL | Status: DC | PRN

## 2024-05-04 MED ORDER — ASPIRIN 81 MG PO CHEW
CHEWABLE_TABLET | ORAL | Status: DC | PRN
  Administered 2024-05-04: 81 mg via ORAL

## 2024-05-04 MED ORDER — ASPIRIN 81 MG PO CHEW
CHEWABLE_TABLET | ORAL | Status: AC
  Filled 2024-05-04: qty 1

## 2024-05-04 MED ORDER — SODIUM CHLORIDE 0.9 % IV SOLN: INTRAVENOUS | Status: DC

## 2024-05-04 MED ORDER — LIDOCAINE HCL (PF) 1 % IJ SOLN
INTRAMUSCULAR | Status: DC | PRN
  Administered 2024-05-04: 15 mL

## 2024-05-04 MED ORDER — HEPARIN (PORCINE) IN NACL 1000-0.9 UT/500ML-% IV SOLN
INTRAVENOUS | Status: DC | PRN
  Administered 2024-05-04 (×2): 500 mL

## 2024-05-04 MED ORDER — ONDANSETRON HCL 4 MG/2ML IJ SOLN: 4.0000 mg | Freq: Four times a day (QID) | INTRAMUSCULAR | Status: DC | PRN

## 2024-05-04 MED ORDER — MIDAZOLAM HCL 2 MG/2ML IJ SOLN
INTRAMUSCULAR | Status: AC
Start: 2024-05-04 — End: ?
  Filled 2024-05-04: qty 2

## 2024-05-04 MED ORDER — SODIUM CHLORIDE 0.9% FLUSH
3.0000 mL | INTRAVENOUS | Status: DC | PRN
Start: 2024-05-04 — End: 2024-05-04

## 2024-05-04 MED ORDER — SODIUM CHLORIDE 0.9 % IV SOLN: 250.0000 mL | INTRAVENOUS | Status: DC | PRN

## 2024-05-04 MED ORDER — CLOPIDOGREL BISULFATE 75 MG PO TABS
ORAL_TABLET | ORAL | Status: AC
Start: 2024-05-04 — End: ?
  Filled 2024-05-04: qty 1

## 2024-05-04 MED ORDER — LIDOCAINE HCL (PF) 1 % IJ SOLN
INTRAMUSCULAR | Status: AC
  Filled 2024-05-04: qty 30

## 2024-05-04 MED ORDER — FENTANYL CITRATE (PF) 100 MCG/2ML IJ SOLN
INTRAMUSCULAR | Status: DC | PRN
  Administered 2024-05-04: 25 ug via INTRAVENOUS

## 2024-05-04 MED ORDER — MIDAZOLAM HCL 2 MG/2ML IJ SOLN
INTRAMUSCULAR | Status: DC | PRN
  Administered 2024-05-04: 1 mg via INTRAVENOUS

## 2024-05-04 MED ORDER — HEPARIN SODIUM (PORCINE) 1000 UNIT/ML IJ SOLN
INTRAMUSCULAR | Status: DC | PRN
  Administered 2024-05-04: 5000 [IU] via INTRAVENOUS

## 2024-05-04 MED ORDER — SODIUM CHLORIDE 0.9% FLUSH: 3.0000 mL | Freq: Two times a day (BID) | INTRAVENOUS | Status: DC

## 2024-05-04 MED ORDER — ACETAMINOPHEN 325 MG PO TABS: 650.0000 mg | ORAL_TABLET | ORAL | Status: DC | PRN

## 2024-05-04 MED ORDER — ATORVASTATIN CALCIUM 20 MG PO TABS: 20.0000 mg | ORAL_TABLET | Freq: Every day | ORAL | 3 refills | Status: DC

## 2024-05-04 MED ORDER — CLOPIDOGREL BISULFATE 300 MG PO TABS
ORAL_TABLET | ORAL | Status: DC | PRN
  Administered 2024-05-04: 75 mg via ORAL

## 2024-05-04 MED ORDER — SODIUM CHLORIDE 0.9 % IV SOLN: INTRAVENOUS | Status: AC

## 2024-05-04 MED ORDER — HYDROCODONE-ACETAMINOPHEN 7.5-325 MG/15ML PO SOLN: 10.0000 mL | Freq: Once | ORAL | Status: DC

## 2024-05-04 MED ORDER — PROTAMINE SULFATE 10 MG/ML IV SOLN
INTRAVENOUS | Status: DC | PRN
  Administered 2024-05-04 (×2): 5 mg via INTRAVENOUS

## 2024-05-04 MED ORDER — HYDRALAZINE HCL 20 MG/ML IJ SOLN: 5.0000 mg | INTRAMUSCULAR | Status: DC | PRN

## 2024-05-04 MED ORDER — IODIXANOL 320 MG/ML IV SOLN
INTRAVENOUS | Status: DC | PRN
  Administered 2024-05-04: 80 mL via INTRA_ARTERIAL

## 2024-05-04 MED ORDER — PROTAMINE SULFATE 10 MG/ML IV SOLN
INTRAVENOUS | Status: AC
  Filled 2024-05-04: qty 5

## 2024-05-04 MED ORDER — CLOPIDOGREL BISULFATE 75 MG PO TABS: 75.0000 mg | ORAL_TABLET | Freq: Every day | ORAL | Status: DC

## 2024-05-04 MED ORDER — HEPARIN SODIUM (PORCINE) 1000 UNIT/ML IJ SOLN
INTRAMUSCULAR | Status: AC
  Filled 2024-05-04: qty 10

## 2024-05-04 MED ORDER — ASPIRIN 81 MG PO TBEC: 81.0000 mg | DELAYED_RELEASE_TABLET | Freq: Every day | ORAL | Status: DC

## 2024-05-04 MED ORDER — LABETALOL HCL 5 MG/ML IV SOLN
10.0000 mg | INTRAVENOUS | Status: DC | PRN
  Administered 2024-05-04: 10 mg via INTRAVENOUS
  Filled 2024-05-04: qty 4

## 2024-05-04 SURGICAL SUPPLY — 14 items
CATH OMNI FLUSH 5F 65CM (CATHETERS) IMPLANT
CATH QUICKCROSS SUPP .035X90CM (MICROCATHETER) IMPLANT
CATH SHOCKWAVE C2 2.5X12 (CATHETERS) IMPLANT
COVER DOME SNAP 22 D (MISCELLANEOUS) IMPLANT
DEVICE TORQUE SEADRAGON GRN (MISCELLANEOUS) IMPLANT
GUIDEWIRE ANGLED .035 180CM (WIRE) IMPLANT
KIT ENCORE 26 ADVANTAGE (KITS) IMPLANT
KIT MICROPUNCTURE NIT STIFF (SHEATH) IMPLANT
SHEATH CATAPULT 5F 45 MP (SHEATH) IMPLANT
SHEATH PINNACLE 5F 10CM (SHEATH) IMPLANT
SHEATH PROBE COVER 6X72 (BAG) IMPLANT
TRAY PV CATH (CUSTOM PROCEDURE TRAY) ×3 IMPLANT
WIRE BENTSON .035X145CM (WIRE) IMPLANT
WIRE SPARTACORE .014X300CM (WIRE) IMPLANT

## 2024-05-04 NOTE — Progress Notes (Signed)
Up and walked and tolerated well; right groin stable no bleeding or hematoma

## 2024-05-04 NOTE — Op Note (Signed)
 Patient name: Jessica [name] MRN: [id number] DOB: 19-Apr-1932 Sex: female  05/04/2024 Pre-operative Diagnosis: Critical limb ischemia of the left lower extremity with tissue loss Post-operative diagnosis:  Same Surgeon:  Young Hensen, MD Procedure Performed: 1.  Ultrasound-guided access right common femoral artery 2.  Aortogram with catheter selection of aorta 3.  Left lower extremity arteriogram with catheter selection of the left common femoral artery 4.  Left popliteal artery angioplasty with shockwave lithotripsy (2.5 mm x 12 mm x 120 pulses) 5.  58 minutes of monitored moderate conscious sedation time  Indications: 89 year old female well-known to vascular surgery that presented with rest pain and tissue loss in the left foot.  She presents for aortogram, lower extremity arteriogram with possible intervention with a focus on the left leg  Findings:   Ultrasound-guided access right common femoral artery.  Sheath was occlusive in the right common femoral artery.  Aortogram showed patent infrarenal aorta with patent iliacs although these are very small.  On the left she had a patent common femoral after previous femoral endarterectomy in the past.  Patent SFA and profunda.  The entire SFA was patent.  The popliteal artery behind the left knee had a subtotal occlusion with 99% stenosis.  Distally she has three-vessel runoff with very small tibial vessels.  The popliteal artery behind the knee and below the knee was treated with a 2.5 mm lithotripsy balloon for 30 pulses in a total of 4 segments for 120 pulses total.  Patent vessel at completion with three-vessel runoff.  No significant residual stenosis.  Vessel is extremely small measuring about 2 mm below the knee   Procedure:  The patient was identified in the holding area and taken to room 8.  The patient was then placed supine on the table and prepped and draped in the usual sterile fashion.  A time out was called.  Patient received  Versed  and fentanyl  for conscious moderate sedation.  Vital signs were monitored including heart rate, respiratory rate, oxygenation, and blood pressure.  I was present for all moderate sedation.  Ultrasound was used to evaluate the right common femoral artery, it was patent, an image was saved.  This was accessed with a micro access needle, placed a microwire, and micro sheath.  I then used a Bentson wire and exchanged for a short 5 [place] sheath.  An abdominal aortogram was then obtained.  I then crossed the aortic bifurcation and placed a Omni Flush catheter into the left common femoral and got left leg runoff.  Ultimately I elected for left popliteal intervention.  I then used a 5 [place] catapult sheath in the right groin over the aortic bifurcation.  The patient was then given 100 units per kilogram IV heparin .  I then went with a Glidewire down the left SFA and exchanged for a 014 [place] core with a quick cross.  The 0.014 wire was used to cross the popliteal occlusion behind the knee.  I got a wire down the posterior tibial.  This was then treated with shockwave lithotripsy using a 2.5 mm x 12 mm lithotripsy balloon inflated to 4 atm for 30 pulses for total of 4 segments with 120 pulses total.  I was very satisfied with results.  No significant residual stenosis with patent vessel and three-vessel runoff.  Wires and catheters were removed.  I did get several sheath shots on the right to make sure the common femoral was patent which it was.  It just appeared that the  sheath was just occlusive.  Sheath in the right groin was then removed and manual pressure held.  We did give 10 of protamine .   Plan: Excellent results after left popliteal intervention.  Now has inline flow.  Aspirin  Plavix  statin.  Will arrange follow-up in 1 month with left leg arterial duplex and ABIs.  Young Hensen, MD [facility] Office: [phone]
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
## 2024-05-12 ENCOUNTER — Other Ambulatory Visit: Payer: Self-pay | Admitting: Primary Care

## 2024-05-16 ENCOUNTER — Other Ambulatory Visit: Payer: Self-pay | Admitting: Pulmonary Disease

## 2024-05-16 NOTE — Telephone Encounter (Signed)
 Copied from CRM [id number]. Topic: General - Other >> May 16, 2024 10:23 AM Jessica [name] wrote: Reason for CRM: the last time patient  seen Jessica [name], he put her on a nebulizer exclusively, tried it and within one hour it caused her to gasp for air, it took her breath away. She stopped using the nebulizer and is back to taking trelegy and maintenance dose of prednisone , she is currently out of both and her pharmacy not refilling, she really need help getting these refilled and wanted to know if she can get a callback for further assistance.

## 2024-05-17 ENCOUNTER — Ambulatory Visit: Payer: Self-pay

## 2024-05-17 NOTE — Telephone Encounter (Signed)
 Copied from CRM [id number]. Topic: General - Other >> May 16, 2024 10:23 AM Jessica [name] wrote: Reason for CRM: the last time patient  seen Dewald, he put her on a nebulizer exclusively, tried it and within one hour it caused her to gasp for air, it took her breath away. She stopped using the nebulizer and is back to taking trelegy and maintenance dose of prednisone , she is currently out of both and her pharmacy not refilling, she really need help getting these refilled and wanted to know if she can get a callback for further assistance. >> May 17, 2024 12:42 PM Jessica [name] wrote: Patient never heard back about this and is requesting to speak to a member of the clinical staff.   [id number] [name], [place] - [address], [place] [address] Phone: [phone]  Fax: [phone] DEA #: [id number]  DAW Reason: --    FYI Only or Action Required?: Action required by provider Patient requesting refill for Trelegy Ellipta  and Prednisone  sent to the above pharmacy and would like a call back with a response to this request.   Patient is followed in Pulmonology for COPD, last seen on 04/05/2024 by Wilfredo Hanly, MD. Called Nurse Triage reporting Medication Refill. Symptoms began yesterday. Symptoms are: unchanged.  Triage Disposition: Call PCP Now  Patient/caregiver understands and will follow disposition?: Yes   Reason for Disposition  [1] Prescription refill request for ESSENTIAL medicine (i.e., likelihood of harm to patient if not taken) AND [2] triager unable to refill per department policy  Answer Assessment - Initial Assessment Questions 1. DRUG NAME: What medicine do you need to have refilled?     Trelegy Ellipta  and Prednisone   2. REFILLS REMAINING: How many refills are remaining? (Note: The label on the medicine or pill bottle will show how many refills are remaining. If there are no refills remaining, then a renewal may be needed.)     0 3.  EXPIRATION DATE: What is the expiration date? (Note: The label states when the prescription will expire, and thus can no longer be refilled.)     0 4. PRESCRIBING HCP: Who prescribed it? Reason: If prescribed by specialist, call should be referred to that group.     Dr. Diania Fortes 5. SYMPTOMS: Do you have any symptoms?     Mild to moderate shortness of breath  Protocols used: Medication Refill and Renewal Call-A-AH

## 2024-05-18 ENCOUNTER — Telehealth: Payer: Self-pay

## 2024-05-18 MED ORDER — PREDNISONE 20 MG PO TABS: 20.0000 mg | ORAL_TABLET | Freq: Every day | ORAL | 0 refills | Status: DC

## 2024-05-18 NOTE — Telephone Encounter (Signed)
 Dr. Diania Fortes note on 04/05/24 -Rx for prednisone  taper -finish as directed.  He would decide on daily prednisone  at next ov in 2 months.   If she is having trouble can have Prednisone  20mg  daily for 5 days, until Dr. Diania Fortes can answer about daily steroids  Will send rx .   Please contact office for sooner follow up if symptoms do not improve or worsen or seek emergency care

## 2024-05-18 NOTE — Telephone Encounter (Signed)
 Copied from CRM [id number]. Topic: Clinical - Medication Question >> May 18, 2024  3:31 PM Crist Dominion wrote: Reason for CRM: Patient states she has been trying since Tuesday to get an answer for a question she has, she is short of breath, and called 3 times to get a round of prednisone  as it does help her more than anything. No one has responded to her, can some please call her with a yes or no on this matter.   *Patient spoke with nurse triage yesterday 6/18*

## 2024-05-18 NOTE — Telephone Encounter (Signed)
 Copied from CRM [id number]. Topic: Clinical - Medication Question >> May 18, 2024  3:31 PM Crist Dominion wrote: Reason for CRM: Patient states she has been trying since Tuesday to get an answer for a question she has, she is short of breath, and called 3 times to get a round of prednisone  as it does help her more than anything. No one has responded to her, can some please call her with a yes or no on this matter.   *Patient spoke with nurse triage yesterday 6/18*     Spoke with patient sent request to DOD, will call patient back with update on Rx. (Looks like original message was sent back to the triage nurse by accident)

## 2024-05-19 NOTE — Telephone Encounter (Signed)
 Noted. Thanks Re education provided to staff.

## 2024-05-19 NOTE — Telephone Encounter (Signed)
 Spoke with patient regarding prior message.Advised patient that Tammy has sent in a script for prednisone  20mg  daily for 5 days, until Dr. Diania Fortes can answer about daily steroids. Patient does have a f/u with Beth on 05/30/2024 at 2:30pm.  Patient's voice was understanding.Noting else further needed.

## 2024-05-22 NOTE — Telephone Encounter (Signed)
 Pt is scheduled to see Landry Ferrari, NP on 05-30-24. NFN

## 2024-05-24 ENCOUNTER — Other Ambulatory Visit: Payer: Self-pay

## 2024-05-24 DIAGNOSIS — I70229 Atherosclerosis of native arteries of extremities with rest pain, unspecified extremity: Secondary | ICD-10-CM

## 2024-05-30 ENCOUNTER — Ambulatory Visit: Admitting: Primary Care

## 2024-06-01 ENCOUNTER — Other Ambulatory Visit: Payer: Self-pay | Admitting: Pulmonary Disease

## 2024-06-01 DIAGNOSIS — J449 Chronic obstructive pulmonary disease, unspecified: Secondary | ICD-10-CM

## 2024-06-07 ENCOUNTER — Telehealth: Payer: Self-pay

## 2024-06-07 NOTE — Telephone Encounter (Signed)
 Copied from CRM [id number]. Topic: Clinical - Medical Advice >> May 30, 2024  1:25 PM Corean SAUNDERS wrote: Reason for CRM: Patients daughter [name] cancelled patiens appointment today 7/1 as the patient is having some issues that are causing her to not be able to leave the house. [name] is requesting Almarie Ferrari to please call as she would like to discuss the issues the patient is having. [phone] >> May 31, 2024 12:21 PM Russell PARAS wrote: Daughter, [name], is contacting clinic again due to no call back on 07/01. She is concerned with her mother's condition. Pt has become homebound and is afraid to leave her home, would like to speak with provider or nurse further.   CB# [phone]    I called and spoke to pt's daughter, [name]. (DPR) [name] states the pt is on O2 24/7 and they have a poc and the pt does not trust it which has caused the daughter t struggle to get the pt out of the house. [name] states she gets the pt up in the morning, helps her get dressed, and by the time they are ready to walk out the door, she shakes her head no and is scared the POC will die while they are out. [name] states she makes sure it is charged everyday. Pt will start to panic before leaving. [name] states she is trying to get the pt to move to [place] permanently to either live with her or put her in a home due to [facility] also caring for her special needs daughter. [name] states the pt needs a 24/7 care. Pt also has tanks but they belong to the DME company. I informed [name] that the tanks may have came from the DME company (which seems to be [employer]) but they are for the pt to use. The DME company will not send people tanks if they don't need them.  [name] states she does not know how to turn the tanks on and she was never shown. [name] has not contacted the DME company as she did not know who it was from. [name] states the pt's lungs do not sound good and sound raspy. I informed [name] that she can call  the DME and they can show her how to use the tanks. I also informed [name] that the POC will last the pt the drive to our office, and we can put her on our tanks during her appointment so she Is not running her POC battery. [name] verbalized understanding and would like advise on what to do. I advised the pt's daughter I would notify Landry Ferrari, NP and get back to her. [name] requested that the office calls her back tomorrow. (06-08-24)

## 2024-06-07 NOTE — Telephone Encounter (Signed)
 Can you send to Dr. Kara, I believe he is in clinic and saw patient most recently  I would recommend patients family get a pulse oximeter to be able to show patient that her oxygen  level on the POC is within a safe range. They can purchase a spare battery to bring with them. If patient would feel more comfortable with tanks we can discontinue POC and place an order for continuous oxygen  tanks. May also want to discuss anxiety with primary care

## 2024-06-08 ENCOUNTER — Inpatient Hospital Stay (HOSPITAL_COMMUNITY): Admission: RE | Admit: 2024-06-08 | Source: Ambulatory Visit

## 2024-06-08 ENCOUNTER — Ambulatory Visit (HOSPITAL_COMMUNITY)

## 2024-06-08 NOTE — Telephone Encounter (Signed)
 Given she has become home bound and more immobile, she should likely go to the hospital for further evaluation. She likely needs to be admitted, get updated lab work and imaging. She will be evaluated by PT and may need a rehab to get her strength back.   JD  I called and spoke with the pt's daughter, [name] and notified of response from Dr. Kara. She verbalized understanding. Denies any further questions.

## 2024-06-08 NOTE — Telephone Encounter (Signed)
 Dr Kara, can you advise?   I will relay to pt's daughter of Beth's and your recommendations after your review

## 2024-06-13 ENCOUNTER — Encounter

## 2024-06-13 ENCOUNTER — Inpatient Hospital Stay (HOSPITAL_COMMUNITY)
Admission: EM | Admit: 2024-06-13 | Discharge: 2024-06-30 | DRG: 190 | Disposition: E | Attending: Internal Medicine | Admitting: Internal Medicine

## 2024-06-13 ENCOUNTER — Other Ambulatory Visit: Payer: Self-pay

## 2024-06-13 ENCOUNTER — Emergency Department (HOSPITAL_COMMUNITY)

## 2024-06-13 ENCOUNTER — Encounter (HOSPITAL_COMMUNITY): Payer: Self-pay | Admitting: Emergency Medicine

## 2024-06-13 DIAGNOSIS — R918 Other nonspecific abnormal finding of lung field: Secondary | ICD-10-CM | POA: Diagnosis present

## 2024-06-13 DIAGNOSIS — R0602 Shortness of breath: Secondary | ICD-10-CM | POA: Diagnosis not present

## 2024-06-13 DIAGNOSIS — L8990 Pressure ulcer of unspecified site, unspecified stage: Secondary | ICD-10-CM | POA: Insufficient documentation

## 2024-06-13 DIAGNOSIS — R600 Localized edema: Secondary | ICD-10-CM | POA: Diagnosis present

## 2024-06-13 DIAGNOSIS — R059 Cough, unspecified: Secondary | ICD-10-CM | POA: Diagnosis not present

## 2024-06-13 DIAGNOSIS — F419 Anxiety disorder, unspecified: Secondary | ICD-10-CM | POA: Diagnosis not present

## 2024-06-13 DIAGNOSIS — R451 Restlessness and agitation: Secondary | ICD-10-CM | POA: Diagnosis not present

## 2024-06-13 DIAGNOSIS — B370 Candidal stomatitis: Secondary | ICD-10-CM | POA: Diagnosis not present

## 2024-06-13 DIAGNOSIS — R0603 Acute respiratory distress: Secondary | ICD-10-CM

## 2024-06-13 DIAGNOSIS — J439 Emphysema, unspecified: Secondary | ICD-10-CM | POA: Diagnosis present

## 2024-06-13 DIAGNOSIS — Z79899 Other long term (current) drug therapy: Secondary | ICD-10-CM | POA: Diagnosis not present

## 2024-06-13 DIAGNOSIS — J449 Chronic obstructive pulmonary disease, unspecified: Secondary | ICD-10-CM | POA: Diagnosis not present

## 2024-06-13 DIAGNOSIS — R1310 Dysphagia, unspecified: Secondary | ICD-10-CM | POA: Diagnosis present

## 2024-06-13 DIAGNOSIS — Z515 Encounter for palliative care: Secondary | ICD-10-CM

## 2024-06-13 DIAGNOSIS — Z7189 Other specified counseling: Secondary | ICD-10-CM

## 2024-06-13 DIAGNOSIS — R636 Underweight: Secondary | ICD-10-CM | POA: Diagnosis present

## 2024-06-13 DIAGNOSIS — M545 Low back pain, unspecified: Secondary | ICD-10-CM | POA: Diagnosis present

## 2024-06-13 DIAGNOSIS — J441 Chronic obstructive pulmonary disease with (acute) exacerbation: Secondary | ICD-10-CM | POA: Diagnosis not present

## 2024-06-13 DIAGNOSIS — I701 Atherosclerosis of renal artery: Secondary | ICD-10-CM | POA: Diagnosis present

## 2024-06-13 DIAGNOSIS — E781 Pure hyperglyceridemia: Secondary | ICD-10-CM | POA: Diagnosis present

## 2024-06-13 DIAGNOSIS — E876 Hypokalemia: Secondary | ICD-10-CM | POA: Diagnosis not present

## 2024-06-13 DIAGNOSIS — R64 Cachexia: Secondary | ICD-10-CM | POA: Diagnosis not present

## 2024-06-13 DIAGNOSIS — R54 Age-related physical debility: Secondary | ICD-10-CM | POA: Diagnosis present

## 2024-06-13 DIAGNOSIS — K219 Gastro-esophageal reflux disease without esophagitis: Secondary | ICD-10-CM | POA: Diagnosis present

## 2024-06-13 DIAGNOSIS — Z87891 Personal history of nicotine dependence: Secondary | ICD-10-CM

## 2024-06-13 DIAGNOSIS — M419 Scoliosis, unspecified: Secondary | ICD-10-CM | POA: Diagnosis present

## 2024-06-13 DIAGNOSIS — Z711 Person with feared health complaint in whom no diagnosis is made: Secondary | ICD-10-CM | POA: Diagnosis not present

## 2024-06-13 DIAGNOSIS — J9611 Chronic respiratory failure with hypoxia: Secondary | ICD-10-CM | POA: Diagnosis present

## 2024-06-13 DIAGNOSIS — J9620 Acute and chronic respiratory failure, unspecified whether with hypoxia or hypercapnia: Secondary | ICD-10-CM | POA: Diagnosis not present

## 2024-06-13 DIAGNOSIS — Z7951 Long term (current) use of inhaled steroids: Secondary | ICD-10-CM

## 2024-06-13 DIAGNOSIS — Z90710 Acquired absence of both cervix and uterus: Secondary | ICD-10-CM

## 2024-06-13 DIAGNOSIS — I739 Peripheral vascular disease, unspecified: Secondary | ICD-10-CM | POA: Diagnosis present

## 2024-06-13 DIAGNOSIS — Z7982 Long term (current) use of aspirin: Secondary | ICD-10-CM

## 2024-06-13 DIAGNOSIS — Z66 Do not resuscitate: Secondary | ICD-10-CM | POA: Diagnosis not present

## 2024-06-13 DIAGNOSIS — T502X5A Adverse effect of carbonic-anhydrase inhibitors, benzothiadiazides and other diuretics, initial encounter: Secondary | ICD-10-CM | POA: Diagnosis present

## 2024-06-13 DIAGNOSIS — Z83438 Family history of other disorder of lipoprotein metabolism and other lipidemia: Secondary | ICD-10-CM

## 2024-06-13 DIAGNOSIS — I5031 Acute diastolic (congestive) heart failure: Secondary | ICD-10-CM | POA: Diagnosis present

## 2024-06-13 DIAGNOSIS — I272 Pulmonary hypertension, unspecified: Secondary | ICD-10-CM | POA: Diagnosis not present

## 2024-06-13 DIAGNOSIS — Z681 Body mass index (BMI) 19 or less, adult: Secondary | ICD-10-CM | POA: Diagnosis not present

## 2024-06-13 DIAGNOSIS — J9811 Atelectasis: Secondary | ICD-10-CM | POA: Diagnosis not present

## 2024-06-13 DIAGNOSIS — Z9981 Dependence on supplemental oxygen: Secondary | ICD-10-CM

## 2024-06-13 DIAGNOSIS — K5900 Constipation, unspecified: Secondary | ICD-10-CM | POA: Diagnosis present

## 2024-06-13 DIAGNOSIS — G8929 Other chronic pain: Secondary | ICD-10-CM | POA: Diagnosis present

## 2024-06-13 DIAGNOSIS — J984 Other disorders of lung: Secondary | ICD-10-CM | POA: Diagnosis present

## 2024-06-13 DIAGNOSIS — M19041 Primary osteoarthritis, right hand: Secondary | ICD-10-CM | POA: Diagnosis present

## 2024-06-13 DIAGNOSIS — Z7902 Long term (current) use of antithrombotics/antiplatelets: Secondary | ICD-10-CM | POA: Diagnosis not present

## 2024-06-13 DIAGNOSIS — I10 Essential (primary) hypertension: Secondary | ICD-10-CM | POA: Diagnosis present

## 2024-06-13 DIAGNOSIS — I110 Hypertensive heart disease with heart failure: Secondary | ICD-10-CM | POA: Diagnosis not present

## 2024-06-13 DIAGNOSIS — Z8249 Family history of ischemic heart disease and other diseases of the circulatory system: Secondary | ICD-10-CM | POA: Diagnosis not present

## 2024-06-13 DIAGNOSIS — L89311 Pressure ulcer of right buttock, stage 1: Secondary | ICD-10-CM | POA: Diagnosis not present

## 2024-06-13 DIAGNOSIS — I5030 Unspecified diastolic (congestive) heart failure: Secondary | ICD-10-CM | POA: Diagnosis not present

## 2024-06-13 DIAGNOSIS — Z9862 Peripheral vascular angioplasty status: Secondary | ICD-10-CM

## 2024-06-13 DIAGNOSIS — M7989 Other specified soft tissue disorders: Secondary | ICD-10-CM | POA: Diagnosis present

## 2024-06-13 DIAGNOSIS — L89321 Pressure ulcer of left buttock, stage 1: Secondary | ICD-10-CM | POA: Diagnosis present

## 2024-06-13 DIAGNOSIS — R0609 Other forms of dyspnea: Secondary | ICD-10-CM | POA: Diagnosis not present

## 2024-06-13 DIAGNOSIS — M19042 Primary osteoarthritis, left hand: Secondary | ICD-10-CM | POA: Diagnosis present

## 2024-06-13 LAB — CBC
HCT: 38.8 % (ref 36.0–46.0)
Hemoglobin: 10.7 g/dL — ABNORMAL LOW (ref 12.0–15.0)
MCH: 27 pg (ref 26.0–34.0)
MCHC: 27.6 g/dL — ABNORMAL LOW (ref 30.0–36.0)
MCV: 97.7 fL (ref 80.0–100.0)
Platelets: 353 K/uL (ref 150–400)
RBC: 3.97 MIL/uL (ref 3.87–5.11)
RDW: 13.8 % (ref 11.5–15.5)
WBC: 12 K/uL — ABNORMAL HIGH (ref 4.0–10.5)
nRBC: 0 % (ref 0.0–0.2)

## 2024-06-13 LAB — BLOOD GAS, VENOUS
Acid-Base Excess: 8.5 mmol/L — ABNORMAL HIGH (ref 0.0–2.0)
Bicarbonate: 36.1 mmol/L — ABNORMAL HIGH (ref 20.0–28.0)
O2 Saturation: 32.1 %
Patient temperature: 37
pCO2, Ven: 67 mmHg — ABNORMAL HIGH (ref 44–60)
pH, Ven: 7.34 (ref 7.25–7.43)
pO2, Ven: <31 mmHg — CL (ref 32–45)

## 2024-06-13 LAB — BRAIN NATRIURETIC PEPTIDE: B Natriuretic Peptide: 969.8 pg/mL — ABNORMAL HIGH (ref 0.0–100.0)

## 2024-06-13 LAB — D-DIMER, QUANTITATIVE: D-Dimer, Quant: 1.15 ug{FEU}/mL — ABNORMAL HIGH (ref 0.00–0.50)

## 2024-06-13 LAB — BASIC METABOLIC PANEL WITH GFR
Anion gap: 10 (ref 5–15)
BUN: 17 mg/dL (ref 8–23)
CO2: 31 mmol/L (ref 22–32)
Calcium: 8.6 mg/dL — ABNORMAL LOW (ref 8.9–10.3)
Chloride: 99 mmol/L (ref 98–111)
Creatinine, Ser: 0.69 mg/dL (ref 0.44–1.00)
GFR, Estimated: >60 mL/min (ref >60)
Glucose, Bld: 141 mg/dL — ABNORMAL HIGH (ref 70–99)
Potassium: 4.5 mmol/L (ref 3.5–5.1)
Sodium: 140 mmol/L (ref 135–145)

## 2024-06-13 MED ORDER — IPRATROPIUM-ALBUTEROL 0.5-2.5 (3) MG/3ML IN SOLN
3.0000 mL | Freq: Once | RESPIRATORY_TRACT | Status: AC
  Administered 2024-06-13: 3 mL via RESPIRATORY_TRACT
  Filled 2024-06-13: qty 3

## 2024-06-13 MED ORDER — METHYLPREDNISOLONE SODIUM SUCC 125 MG IJ SOLR
125.0000 mg | INTRAMUSCULAR | Status: AC
  Administered 2024-06-13: 125 mg via INTRAVENOUS
  Filled 2024-06-13: qty 2
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
## 2024-06-13 NOTE — ED Triage Notes (Signed)
  Patient BIB EMS from home for cough and SOB for the past 3 days.  Patient has hx of COPD and wears 3 L O2 at all times.  Has had productive cough the past 3 days, but denies any fevers.  No OTC meds at home.  Denies any pain. SPO2 99% on 3 L [place].

## 2024-06-14 ENCOUNTER — Observation Stay (HOSPITAL_COMMUNITY)

## 2024-06-14 ENCOUNTER — Emergency Department (HOSPITAL_COMMUNITY)

## 2024-06-14 ENCOUNTER — Encounter (HOSPITAL_COMMUNITY): Payer: Self-pay

## 2024-06-14 ENCOUNTER — Other Ambulatory Visit: Payer: Self-pay | Admitting: Pulmonary Disease

## 2024-06-14 DIAGNOSIS — R0603 Acute respiratory distress: Secondary | ICD-10-CM | POA: Diagnosis not present

## 2024-06-14 DIAGNOSIS — Z7951 Long term (current) use of inhaled steroids: Secondary | ICD-10-CM | POA: Diagnosis not present

## 2024-06-14 DIAGNOSIS — R0609 Other forms of dyspnea: Secondary | ICD-10-CM | POA: Diagnosis not present

## 2024-06-14 DIAGNOSIS — Z7902 Long term (current) use of antithrombotics/antiplatelets: Secondary | ICD-10-CM | POA: Diagnosis not present

## 2024-06-14 DIAGNOSIS — I10 Essential (primary) hypertension: Secondary | ICD-10-CM

## 2024-06-14 DIAGNOSIS — E781 Pure hyperglyceridemia: Secondary | ICD-10-CM | POA: Diagnosis present

## 2024-06-14 DIAGNOSIS — R918 Other nonspecific abnormal finding of lung field: Secondary | ICD-10-CM | POA: Diagnosis not present

## 2024-06-14 DIAGNOSIS — Z66 Do not resuscitate: Secondary | ICD-10-CM | POA: Diagnosis present

## 2024-06-14 DIAGNOSIS — Z681 Body mass index (BMI) 19 or less, adult: Secondary | ICD-10-CM | POA: Diagnosis not present

## 2024-06-14 DIAGNOSIS — Z79899 Other long term (current) drug therapy: Secondary | ICD-10-CM | POA: Diagnosis not present

## 2024-06-14 DIAGNOSIS — J9811 Atelectasis: Secondary | ICD-10-CM | POA: Diagnosis present

## 2024-06-14 DIAGNOSIS — J439 Emphysema, unspecified: Secondary | ICD-10-CM | POA: Diagnosis present

## 2024-06-14 DIAGNOSIS — I739 Peripheral vascular disease, unspecified: Secondary | ICD-10-CM | POA: Diagnosis present

## 2024-06-14 DIAGNOSIS — Z7189 Other specified counseling: Secondary | ICD-10-CM | POA: Diagnosis not present

## 2024-06-14 DIAGNOSIS — J9620 Acute and chronic respiratory failure, unspecified whether with hypoxia or hypercapnia: Secondary | ICD-10-CM | POA: Diagnosis not present

## 2024-06-14 DIAGNOSIS — R1310 Dysphagia, unspecified: Secondary | ICD-10-CM | POA: Diagnosis present

## 2024-06-14 DIAGNOSIS — Z7982 Long term (current) use of aspirin: Secondary | ICD-10-CM | POA: Diagnosis not present

## 2024-06-14 DIAGNOSIS — L89321 Pressure ulcer of left buttock, stage 1: Secondary | ICD-10-CM | POA: Diagnosis present

## 2024-06-14 DIAGNOSIS — Z8249 Family history of ischemic heart disease and other diseases of the circulatory system: Secondary | ICD-10-CM | POA: Diagnosis not present

## 2024-06-14 DIAGNOSIS — F419 Anxiety disorder, unspecified: Secondary | ICD-10-CM | POA: Diagnosis not present

## 2024-06-14 DIAGNOSIS — R0602 Shortness of breath: Secondary | ICD-10-CM | POA: Diagnosis present

## 2024-06-14 DIAGNOSIS — I110 Hypertensive heart disease with heart failure: Secondary | ICD-10-CM | POA: Diagnosis present

## 2024-06-14 DIAGNOSIS — Z711 Person with feared health complaint in whom no diagnosis is made: Secondary | ICD-10-CM | POA: Diagnosis not present

## 2024-06-14 DIAGNOSIS — I272 Pulmonary hypertension, unspecified: Secondary | ICD-10-CM | POA: Diagnosis present

## 2024-06-14 DIAGNOSIS — R451 Restlessness and agitation: Secondary | ICD-10-CM | POA: Diagnosis not present

## 2024-06-14 DIAGNOSIS — E876 Hypokalemia: Secondary | ICD-10-CM | POA: Diagnosis not present

## 2024-06-14 DIAGNOSIS — J449 Chronic obstructive pulmonary disease, unspecified: Secondary | ICD-10-CM | POA: Diagnosis not present

## 2024-06-14 DIAGNOSIS — J441 Chronic obstructive pulmonary disease with (acute) exacerbation: Secondary | ICD-10-CM

## 2024-06-14 DIAGNOSIS — K5900 Constipation, unspecified: Secondary | ICD-10-CM | POA: Diagnosis not present

## 2024-06-14 DIAGNOSIS — R059 Cough, unspecified: Secondary | ICD-10-CM | POA: Diagnosis not present

## 2024-06-14 DIAGNOSIS — B370 Candidal stomatitis: Secondary | ICD-10-CM | POA: Diagnosis present

## 2024-06-14 DIAGNOSIS — R64 Cachexia: Secondary | ICD-10-CM | POA: Diagnosis present

## 2024-06-14 DIAGNOSIS — J9611 Chronic respiratory failure with hypoxia: Secondary | ICD-10-CM | POA: Diagnosis present

## 2024-06-14 DIAGNOSIS — R600 Localized edema: Secondary | ICD-10-CM | POA: Diagnosis not present

## 2024-06-14 DIAGNOSIS — Z515 Encounter for palliative care: Secondary | ICD-10-CM | POA: Diagnosis not present

## 2024-06-14 DIAGNOSIS — I5030 Unspecified diastolic (congestive) heart failure: Secondary | ICD-10-CM | POA: Diagnosis not present

## 2024-06-14 DIAGNOSIS — I5031 Acute diastolic (congestive) heart failure: Secondary | ICD-10-CM | POA: Diagnosis present

## 2024-06-14 DIAGNOSIS — L89311 Pressure ulcer of right buttock, stage 1: Secondary | ICD-10-CM | POA: Diagnosis present

## 2024-06-14 LAB — CBC
HCT: 36.5 % (ref 36.0–46.0)
Hemoglobin: 10.3 g/dL — ABNORMAL LOW (ref 12.0–15.0)
MCH: 27.2 pg (ref 26.0–34.0)
MCHC: 28.2 g/dL — ABNORMAL LOW (ref 30.0–36.0)
MCV: 96.3 fL (ref 80.0–100.0)
Platelets: 310 K/uL (ref 150–400)
RBC: 3.79 MIL/uL — ABNORMAL LOW (ref 3.87–5.11)
RDW: 13.7 % (ref 11.5–15.5)
WBC: 10.6 K/uL — ABNORMAL HIGH (ref 4.0–10.5)
nRBC: 0 % (ref 0.0–0.2)

## 2024-06-14 LAB — HEPATIC FUNCTION PANEL
ALT: 10 U/L (ref 0–44)
AST: 16 U/L (ref 15–41)
Albumin: 2.5 g/dL — ABNORMAL LOW (ref 3.5–5.0)
Alkaline Phosphatase: 98 U/L (ref 38–126)
Bilirubin, Direct: 0.1 mg/dL (ref 0.0–0.2)
Indirect Bilirubin: 0.4 mg/dL (ref 0.3–0.9)
Total Bilirubin: 0.5 mg/dL (ref 0.0–1.2)
Total Protein: 5.9 g/dL — ABNORMAL LOW (ref 6.5–8.1)

## 2024-06-14 LAB — BASIC METABOLIC PANEL WITH GFR
Anion gap: 8 (ref 5–15)
BUN: 14 mg/dL (ref 8–23)
CO2: 30 mmol/L (ref 22–32)
Calcium: 8.5 mg/dL — ABNORMAL LOW (ref 8.9–10.3)
Chloride: 97 mmol/L — ABNORMAL LOW (ref 98–111)
Creatinine, Ser: 0.53 mg/dL (ref 0.44–1.00)
GFR, Estimated: >60 mL/min (ref >60)
Glucose, Bld: 178 mg/dL — ABNORMAL HIGH (ref 70–99)
Potassium: 4.3 mmol/L (ref 3.5–5.1)
Sodium: 135 mmol/L (ref 135–145)

## 2024-06-14 LAB — ECHOCARDIOGRAM COMPLETE
AR max vel: 2.34 cm2
AV Area VTI: 2.13 cm2
AV Area mean vel: 2.2 cm2
AV Mean grad: 5 mmHg
AV Peak grad: 8.4 mmHg
Ao pk vel: 1.45 m/s
Area-P 1/2: 2.24 cm2
Height: 60 in
S' Lateral: 1.4 cm
Weight: 1132.28 [oz_av]

## 2024-06-14 LAB — TROPONIN I (HIGH SENSITIVITY): Troponin I (High Sensitivity): 13 ng/L

## 2024-06-14 LAB — MRSA NEXT GEN BY PCR, NASAL: MRSA by PCR Next Gen: NOT DETECTED

## 2024-06-14 MED ORDER — METHYLPREDNISOLONE SODIUM SUCC 125 MG IJ SOLR
125.0000 mg | Freq: Two times a day (BID) | INTRAMUSCULAR | Status: AC
  Administered 2024-06-14 (×2): 125 mg via INTRAVENOUS
  Filled 2024-06-14 (×2): qty 2

## 2024-06-14 MED ORDER — GABAPENTIN 100 MG PO CAPS
100.0000 mg | ORAL_CAPSULE | Freq: Every day | ORAL | Status: DC
  Administered 2024-06-14 – 2024-06-16 (×3): 100 mg via ORAL
  Filled 2024-06-14 (×3): qty 1

## 2024-06-14 MED ORDER — IPRATROPIUM-ALBUTEROL 0.5-2.5 (3) MG/3ML IN SOLN
3.0000 mL | RESPIRATORY_TRACT | Status: DC | PRN
  Administered 2024-06-14: 3 mL via RESPIRATORY_TRACT
  Filled 2024-06-14 (×2): qty 3

## 2024-06-14 MED ORDER — ENOXAPARIN SODIUM 30 MG/0.3ML IJ SOSY
30.0000 mg | PREFILLED_SYRINGE | INTRAMUSCULAR | Status: DC
  Administered 2024-06-14 – 2024-06-16 (×3): 30 mg via SUBCUTANEOUS
  Filled 2024-06-14 (×3): qty 0.3

## 2024-06-14 MED ORDER — IOHEXOL 350 MG/ML SOLN
60.0000 mL | Freq: Once | INTRAVENOUS | Status: AC | PRN
  Administered 2024-06-14: 60 mL via INTRAVENOUS

## 2024-06-14 MED ORDER — LOSARTAN POTASSIUM 50 MG PO TABS
100.0000 mg | ORAL_TABLET | Freq: Every evening | ORAL | Status: DC
  Administered 2024-06-14 – 2024-06-16 (×3): 100 mg via ORAL
  Filled 2024-06-14 (×3): qty 2

## 2024-06-14 MED ORDER — IPRATROPIUM-ALBUTEROL 0.5-2.5 (3) MG/3ML IN SOLN
3.0000 mL | Freq: Three times a day (TID) | RESPIRATORY_TRACT | Status: DC
  Administered 2024-06-14: 3 mL via RESPIRATORY_TRACT
  Filled 2024-06-14 (×2): qty 3

## 2024-06-14 MED ORDER — FLUTICASONE PROPIONATE 50 MCG/ACT NA SUSP
2.0000 | Freq: Every day | NASAL | Status: DC
  Administered 2024-06-15 – 2024-06-16 (×2): 2 via NASAL
  Filled 2024-06-14: qty 16

## 2024-06-14 MED ORDER — PANTOPRAZOLE SODIUM 40 MG PO TBEC
40.0000 mg | DELAYED_RELEASE_TABLET | Freq: Every day | ORAL | Status: DC
  Administered 2024-06-15 – 2024-06-16 (×2): 40 mg via ORAL
  Filled 2024-06-14 (×3): qty 1

## 2024-06-14 MED ORDER — IPRATROPIUM-ALBUTEROL 0.5-2.5 (3) MG/3ML IN SOLN
3.0000 mL | Freq: Three times a day (TID) | RESPIRATORY_TRACT | Status: DC
  Administered 2024-06-14 – 2024-06-15 (×2): 3 mL via RESPIRATORY_TRACT
  Filled 2024-06-14 (×2): qty 3

## 2024-06-14 MED ORDER — ONDANSETRON HCL 4 MG/2ML IJ SOLN: 4.0000 mg | Freq: Four times a day (QID) | INTRAMUSCULAR | Status: DC | PRN

## 2024-06-14 MED ORDER — MONTELUKAST SODIUM 10 MG PO TABS
10.0000 mg | ORAL_TABLET | Freq: Every day | ORAL | Status: DC
  Administered 2024-06-14 – 2024-06-16 (×3): 10 mg via ORAL
  Filled 2024-06-14 (×3): qty 1

## 2024-06-14 MED ORDER — SENNOSIDES-DOCUSATE SODIUM 8.6-50 MG PO TABS
1.0000 | ORAL_TABLET | Freq: Every evening | ORAL | Status: DC | PRN
Start: 2024-06-14 — End: 2024-06-18
  Administered 2024-06-14: 1 via ORAL
  Filled 2024-06-14: qty 1

## 2024-06-14 MED ORDER — ORAL CARE MOUTH RINSE
15.0000 mL | OROMUCOSAL | Status: DC
  Administered 2024-06-14 – 2024-06-17 (×12): 15 mL via OROMUCOSAL

## 2024-06-14 MED ORDER — ORAL CARE MOUTH RINSE
15.0000 mL | OROMUCOSAL | Status: DC | PRN
Start: 2024-06-14 — End: 2024-06-19

## 2024-06-14 MED ORDER — ACETAMINOPHEN 325 MG PO TABS
650.0000 mg | ORAL_TABLET | Freq: Four times a day (QID) | ORAL | Status: DC | PRN
Start: 2024-06-14 — End: 2024-06-17

## 2024-06-14 MED ORDER — LORAZEPAM 2 MG/ML IJ SOLN
0.5000 mg | Freq: Three times a day (TID) | INTRAMUSCULAR | Status: DC | PRN
  Administered 2024-06-14: 0.5 mg via INTRAVENOUS
  Filled 2024-06-14: qty 1

## 2024-06-14 MED ORDER — FUROSEMIDE 10 MG/ML IJ SOLN
20.0000 mg | Freq: Once | INTRAMUSCULAR | Status: AC
  Administered 2024-06-14: 20 mg via INTRAVENOUS
  Filled 2024-06-14: qty 2

## 2024-06-14 MED ORDER — ATORVASTATIN CALCIUM 40 MG PO TABS
20.0000 mg | ORAL_TABLET | Freq: Every day | ORAL | Status: DC
  Administered 2024-06-15 – 2024-06-16 (×2): 20 mg via ORAL
  Filled 2024-06-14 (×3): qty 1

## 2024-06-14 MED ORDER — SODIUM CHLORIDE 0.9 % IV SOLN
1.0000 g | INTRAVENOUS | Status: DC
  Administered 2024-06-14 – 2024-06-17 (×4): 1 g via INTRAVENOUS
  Filled 2024-06-14 (×4): qty 10

## 2024-06-14 MED ORDER — GUAIFENESIN ER 600 MG PO TB12
1200.0000 mg | ORAL_TABLET | Freq: Two times a day (BID) | ORAL | Status: DC
  Administered 2024-06-14 – 2024-06-16 (×5): 1200 mg via ORAL
  Filled 2024-06-14 (×6): qty 2

## 2024-06-14 MED ORDER — FUROSEMIDE 10 MG/ML IJ SOLN
20.0000 mg | Freq: Once | INTRAMUSCULAR | Status: AC
  Administered 2024-06-14: 20 mg via INTRAVENOUS
  Filled 2024-06-14: qty 4

## 2024-06-14 MED ORDER — CHLORHEXIDINE GLUCONATE CLOTH 2 % EX PADS
6.0000 | MEDICATED_PAD | Freq: Every day | CUTANEOUS | Status: DC
  Administered 2024-06-14 – 2024-06-17 (×3): 6 via TOPICAL

## 2024-06-14 MED ORDER — PREDNISONE 20 MG PO TABS
40.0000 mg | ORAL_TABLET | Freq: Every day | ORAL | Status: DC
  Administered 2024-06-15 – 2024-06-17 (×3): 40 mg via ORAL
  Filled 2024-06-14 (×3): qty 2

## 2024-06-14 MED ORDER — ACETAMINOPHEN 650 MG RE SUPP
650.0000 mg | Freq: Four times a day (QID) | RECTAL | Status: DC | PRN
Start: 2024-06-14 — End: 2024-06-17

## 2024-06-14 MED ORDER — LORATADINE 10 MG PO TABS
10.0000 mg | ORAL_TABLET | Freq: Every day | ORAL | Status: DC
  Administered 2024-06-15 – 2024-06-16 (×2): 10 mg via ORAL
  Filled 2024-06-14 (×3): qty 1

## 2024-06-14 MED ORDER — CLOPIDOGREL BISULFATE 75 MG PO TABS
75.0000 mg | ORAL_TABLET | Freq: Every day | ORAL | Status: DC
  Administered 2024-06-15 – 2024-06-16 (×2): 75 mg via ORAL
  Filled 2024-06-14 (×3): qty 1

## 2024-06-14 MED ORDER — PROPRANOLOL HCL ER 60 MG PO CP24
120.0000 mg | ORAL_CAPSULE | Freq: Every day | ORAL | Status: DC
  Administered 2024-06-15 – 2024-06-16 (×2): 120 mg via ORAL
  Filled 2024-06-14 (×4): qty 2

## 2024-06-14 MED ORDER — ONDANSETRON HCL 4 MG PO TABS: 4.0000 mg | ORAL_TABLET | Freq: Four times a day (QID) | ORAL | Status: DC | PRN

## 2024-06-14 MED ORDER — MIRTAZAPINE 15 MG PO TABS
15.0000 mg | ORAL_TABLET | Freq: Every day | ORAL | Status: DC
  Administered 2024-06-14: 15 mg via ORAL
  Filled 2024-06-14: qty 1

## 2024-06-14 MED ORDER — AMLODIPINE BESYLATE 5 MG PO TABS
5.0000 mg | ORAL_TABLET | Freq: Every day | ORAL | Status: DC
  Administered 2024-06-15 – 2024-06-16 (×2): 5 mg via ORAL
  Filled 2024-06-14 (×2): qty 1

## 2024-06-14 MED ORDER — ARFORMOTEROL TARTRATE 15 MCG/2ML IN NEBU
15.0000 ug | INHALATION_SOLUTION | Freq: Two times a day (BID) | RESPIRATORY_TRACT | Status: DC
  Administered 2024-06-14 – 2024-06-17 (×8): 15 ug via RESPIRATORY_TRACT
  Filled 2024-06-14 (×9): qty 2

## 2024-06-14 MED ORDER — SODIUM CHLORIDE 0.9% FLUSH
3.0000 mL | Freq: Two times a day (BID) | INTRAVENOUS | Status: DC
  Administered 2024-06-14 – 2024-06-18 (×8): 3 mL via INTRAVENOUS

## 2024-06-14 MED ORDER — BUDESONIDE 0.5 MG/2ML IN SUSP
0.5000 mg | Freq: Two times a day (BID) | RESPIRATORY_TRACT | Status: DC
  Administered 2024-06-14 – 2024-06-17 (×8): 0.5 mg via RESPIRATORY_TRACT
  Filled 2024-06-14 (×9): qty 2

## 2024-06-14 MED ORDER — MORPHINE SULFATE (PF) 2 MG/ML IV SOLN
0.5000 mg | INTRAVENOUS | Status: DC | PRN
  Administered 2024-06-14 – 2024-06-16 (×5): 0.5 mg via INTRAVENOUS
  Filled 2024-06-14 (×5): qty 1

## 2024-06-14 MED ORDER — ASPIRIN 81 MG PO TBEC
81.0000 mg | DELAYED_RELEASE_TABLET | Freq: Every day | ORAL | Status: DC
  Administered 2024-06-15 – 2024-06-16 (×2): 81 mg via ORAL
  Filled 2024-06-14 (×3): qty 1

## 2024-06-14 NOTE — H&P (Addendum)
 History and Physical    Jessica [name] FMW:[id number] DOB: [birth year] DOA: 06/13/2024  PCP: Nichole Senior, MD   Patient coming from: Home  Chief Complaint: SOB, cough  HPI: Jessica [name] is a 89 y.o. female with medical history significant for COPD, chronic Evoxac respiratory failure, hypertension, and PAD status post angioplasty in June 2025, now presenting with worsening shortness of breath and productive cough.  Patient reports 3 days of worsening shortness of breath and productive cough.  She denies any chest pain, fever, or chills associated with this.  Patient also notes that her bilateral lower extremities have been swelling for several months.  She denies orthopnea.  ED Course: Upon arrival to the ED, patient is found to be afebrile and saturating well on 3 L/min of supplemental oxygen  with tachypnea, normal HR, and stable BP.  Labs are most notable for normal creatinine, WBC 12,000, normal troponin, BNP 970, and D-dimer 1.15.  CTA chest is negative for PE but notable for advanced emphysema, and multiple lung nodules.   Patient was treated in the ED with 125 mg IV Solu-Medrol , DuoNeb, and 20 mg IV Lasix .  Review of Systems:  All other systems reviewed and apart from HPI, are negative.  Past Medical History:  Diagnosis Date   Anemia    Arthritis    thumbs (06/27/2015)   Bilateral renal artery stenosis (HCC)    Cataract    Chronic bronchitis (HCC)    Chronic lower back pain    COPD (chronic obstructive pulmonary disease) (HCC)    GERD (gastroesophageal reflux disease)    Headache    probably weekly (06/27/2015)   Hypertension    Hypertriglyceridemia    Migraine    maybe monthly (06/27/2015)   Pneumonia     Past Surgical History:  Procedure Laterality Date   ABDOMINAL AORTOGRAM N/A 05/04/2024   Procedure: ABDOMINAL AORTOGRAM;  Surgeon: Gretta Lonni PARAS, MD;  Location: [facility];  Service: Cardiovascular;  Laterality: N/A;   BALLOON DILATION  1980's?    for renal stenosis   CATARACT EXTRACTION, BILATERAL     ENDARTERECTOMY FEMORAL Right 05/15/2020   Procedure: Right Common Femoral Endarterectomy;  Surgeon: Gretta Lonni PARAS, MD;  Location: [facility] OR;  Service: Vascular;  Laterality: Right;   ESOPHAGOGASTRODUODENOSCOPY N/A 01/29/2024   Procedure: ESOPHAGOGASTRODUODENOSCOPY (EGD);  Surgeon: Nandigam, Kavitha V, MD;  Location: [facility] ENDOSCOPY;  Service: Gastroenterology;  Laterality: N/A;   FEMORAL ARTERY EXPLORATION Left 05/20/2021   Procedure: LEFT LEG THROMBECTOMY WITH LEFT FEMORAL  ENDARTERECTOMY AND PATCH ANGIOPLASTY;  Surgeon: Oris Krystal FALCON, MD;  Location: [facility] OR;  Service: Vascular;  Laterality: Left;   LOWER EXTREMITY ANGIOGRAPHY Left 05/04/2024   Procedure: Lower Extremity Angiography;  Surgeon: Gretta Lonni PARAS, MD;  Location: [facility] INVASIVE CV LAB;  Service: Cardiovascular;  Laterality: Left;   LOWER EXTREMITY INTERVENTION Left 05/04/2024   Procedure: LOWER EXTREMITY INTERVENTION;  Surgeon: Gretta Lonni PARAS, MD;  Location: [facility];  Service: Cardiovascular;  Laterality: Left;   PATCH ANGIOPLASTY Right 05/15/2020   Procedure: Common Femoral Artery Patch Angioplasty using XenoSure Bovine Pericardial Patch;  Surgeon: Gretta Lonni PARAS, MD;  Location: [facility] OR;  Service: Vascular;  Laterality: Right;   PERIPHERAL VASCULAR BALLOON ANGIOPLASTY  05/15/2021   Procedure: PERIPHERAL VASCULAR BALLOON ANGIOPLASTY;  Surgeon: Gretta Lonni PARAS, MD;  Location: [facility];  Service: Cardiovascular;;   Celiac   PERIPHERAL VASCULAR INTERVENTION  05/15/2020   Procedure: PERIPHERAL VASCULAR INTERVENTION;  Surgeon: Gretta Lonni PARAS, MD;  Location: [facility];  Service: Cardiovascular;;  Celiac   RECONSTRUCTION OF EYELID Bilateral 01/2016   Upper and lower eyelid   RENAL ANGIOPLASTY     TOE SURGERY Bilateral    straightened big toe   TONSILLECTOMY  ~ 1941   VAGINAL HYSTERECTOMY  1973   VISCERAL ANGIOGRAPHY N/A 05/15/2020    Procedure: MESENTERIC  ANGIOGRAPHY;  Surgeon: Gretta Lonni PARAS, MD;  Location: [facility];  Service: Cardiovascular;  Laterality: N/A;   VISCERAL ANGIOGRAPHY N/A 05/15/2021   Procedure: MESENTRIC ANGIOGRAPHY;  Surgeon: Gretta Lonni PARAS, MD;  Location: [facility];  Service: Cardiovascular;  Laterality: N/A;    Social History:   reports that she has quit smoking. Her smoking use included cigarettes. She has a 10 pack-year smoking history. She has never used smokeless tobacco. She reports that she does not currently use alcohol . She reports that she does not use drugs.  No Known Allergies  Family History  Problem Relation Age of Onset   Parkinsonism Father    Cancer Brother        Bladder   Hypertension Brother    Hyperlipidemia Brother    Stroke Maternal Grandfather    Colon cancer Neg Hx    Colon polyps Neg Hx    Stomach cancer Neg Hx      Prior to Admission medications   Medication Sig Start Date End Date Taking? Authorizing Provider  albuterol  (PROVENTIL ) (2.5 MG/3ML) 0.083% nebulizer solution INHALE 3 ML BY NEBULIZATION EVERY 6 HOURS AS NEEDED FOR WHEEZING OR SHORTNESS OF BREATH 04/01/23   Kara Dorn NOVAK, MD  albuterol  (VENTOLIN  HFA) 108 (90 Base) MCG/ACT inhaler TAKE 2 PUFFS BY MOUTH EVERY 6 HOURS AS NEEDED FOR WHEEZE OR SHORTNESS OF BREATH 10/22/23   Kara Dorn NOVAK, MD  amLODipine  (NORVASC ) 5 MG tablet Take 5 mg by mouth daily.    [provider]  arformoterol  (BROVANA ) 15 MCG/2ML NEBU Take 2 mLs (15 mcg total) by nebulization 2 (two) times daily. 04/05/24   Kara Dorn NOVAK, MD  aspirin  EC 81 MG tablet 1 tablet Orally Once a day    [provider]  atorvastatin  (LIPITOR) 20 MG tablet Take 1 tablet (20 mg total) by mouth daily. 05/04/24   Gretta Lonni PARAS, MD  budesonide  (PULMICORT ) 0.5 MG/2ML nebulizer solution Take 2 mLs (0.5 mg total) by nebulization 2 (two) times daily. 04/05/24   Kara Dorn NOVAK, MD  clopidogrel  (PLAVIX ) 75  MG tablet Take 75 mg by mouth daily.    [provider]  ferrous sulfate  (IRON SUPPLEMENT) 75 (15 Fe) MG/ML SOLN Take 2.9 mLs (217.5 mg total) by mouth daily with breakfast. 01/31/24   Patsy Lenis, MD  gabapentin  (NEURONTIN ) 100 MG capsule Take 100 mg by mouth at bedtime. 04/30/20   [provider]  HYDROcodone -acetaminophen  (NORCO) 7.5-325 MG tablet Take 1 tablet by mouth daily as needed for moderate pain (pain score 4-6) or severe pain (pain score 7-10). 04/14/23   [provider]  ipratropium (ATROVENT ) 0.03 % nasal spray PLACE 2 SPRAYS INTO BOTH NOSTRILS 2 TIMES DAILY AS NEEDED FOR RHINITIS. 03/12/24   Kara Dorn NOVAK, MD  losartan  (COZAAR ) 100 MG tablet Take 100 mg by mouth every evening. 12/12/18   [provider]  mirtazapine  (REMERON ) 15 MG tablet Take 15 mg by mouth at bedtime.    [provider]  montelukast  (SINGULAIR ) 10 MG tablet Take 10 mg by mouth at bedtime.  04/28/16   [provider]  pantoprazole  (PROTONIX ) 40 MG tablet Take 40 mg by mouth daily. 01/08/24   [provider]  predniSONE  (DELTASONE ) 20 MG tablet Take 1 tablet (20 mg total) by mouth daily with breakfast. 05/18/24   Parrett, Madelin RAMAN, NP  propranolol  ER (INDERAL  LA) 120 MG 24 hr capsule Take 120 mg by mouth daily. 04/08/15   Nichole Senior, MD  revefenacin  (YUPELRI ) 175 MCG/3ML nebulizer solution Take 3 mLs (175 mcg total) by nebulization daily. 04/05/24   Kara Dorn NOVAK, MD  TRELEGY ELLIPTA  200-62.5-25 MCG/ACT AEPB Take 1 puff by mouth daily. 04/15/24   [provider]    Physical Exam: Vitals:   06/14/24 0100 06/14/24 0221 06/14/24 0300 06/14/24 0330  BP:  (!) 134/59 (!) 125/54 131/60  Pulse:  60 64 64  Resp:  19 18 15   Temp: 98.1 F (36.7 C) 98 F (36.7 C)    TempSrc:  Oral    SpO2:  100% 98% 99%  Weight:      Height:        Constitutional: NAD, no pallor or diaphoresis   Eyes: PERTLA, lids and conjunctivae normal ENMT: Mucous membranes  are moist. Posterior pharynx clear of any exudate or lesions.   Neck: supple, no masses  Respiratory: Diminished breath sounds with prolonged expiratory phase. Speaking in full sentences.  Cardiovascular: S1 & S2 heard, regular rate and rhythm. Pretibial pitting edema.  Abdomen: No tenderness, soft. Bowel sounds active.  Musculoskeletal: no clubbing / cyanosis. No joint deformity upper and lower extremities.   Skin: no significant rashes, lesions, ulcers. Warm, dry, well-perfused. Neurologic: CN 2-12 grossly intact. Moving all extremities. Alert and oriented.  Psychiatric: Pleasant. Cooperative.    Labs and Imaging on Admission: I have personally reviewed following labs and imaging studies  CBC: Recent Labs  Lab 06/13/24 2311  WBC 12.0*  HGB 10.7*  HCT 38.8  MCV 97.7  PLT 353   Basic Metabolic Panel: Recent Labs  Lab 06/13/24 2311  NA 140  K 4.5  CL 99  CO2 31  GLUCOSE 141*  BUN 17  CREATININE 0.69  CALCIUM  8.6*   GFR: Estimated Creatinine Clearance: 29.1 mL/min (by C-G formula based on SCr of 0.69 mg/dL). Liver Function Tests: No results for input(s): AST, ALT, ALKPHOS, BILITOT, PROT, ALBUMIN in the last 168 hours. No results for input(s): LIPASE, AMYLASE in the last 168 hours. No results for input(s): AMMONIA in the last 168 hours. Coagulation Profile: No results for input(s): INR, PROTIME in the last 168 hours. Cardiac Enzymes: No results for input(s): CKTOTAL, CKMB, CKMBINDEX, TROPONINI in the last 168 hours. BNP (last 3 results) No results for input(s): PROBNP in the last 8760 hours. HbA1C: No results for input(s): HGBA1C in the last 72 hours. CBG: No results for input(s): GLUCAP in the last 168 hours. Lipid Profile: No results for input(s): CHOL, HDL, LDLCALC, TRIG, CHOLHDL, LDLDIRECT in the last 72 hours. Thyroid  Function Tests: No results for input(s): TSH, T4TOTAL, FREET4, T3FREE, THYROIDAB in  the last 72 hours. Anemia Panel: No results for input(s): VITAMINB12, FOLATE, FERRITIN, TIBC, IRON, RETICCTPCT in the last 72 hours. Urine analysis:    Component Value Date/Time   COLORURINE YELLOW 05/20/2021 1042   APPEARANCEUR CLEAR 05/20/2021 1042   LABSPEC 1.011 05/20/2021 1042   PHURINE 7.0 05/20/2021 1042   GLUCOSEU NEGATIVE 05/20/2021 1042   HGBUR NEGATIVE 05/20/2021 1042   BILIRUBINUR NEGATIVE 05/20/2021 1042   BILIRUBINUR N 05/18/2016 1051   KETONESUR NEGATIVE 05/20/2021 1042   PROTEINUR 30 (A)  05/20/2021 1042   UROBILINOGEN negative 05/18/2016 1051   NITRITE NEGATIVE 05/20/2021 1042   LEUKOCYTESUR NEGATIVE 05/20/2021 1042   Sepsis Labs: @LABRCNTIP (procalcitonin:4,lacticidven:4) )No results found for this or any previous visit (from the past 240 hours).   Radiological Exams on Admission: CT Angio Chest PE W and/or Wo Contrast Result Date: 06/14/2024 EXAM: CTA CHEST AORTA 06/14/2024 12:36:03 AM TECHNIQUE: CTA of the chest was performed after the administration of intravenous contrast. Multiplanar reformatted images are provided for review. MIP images are provided for review. Automated exposure control, iterative reconstruction, and/or weight based adjustment of the mA/kV was utilized to reduce the radiation dose to as low as reasonably achievable. COMPARISON: Chest radiograph 06/13/2024 and CT chest 07/15/2020. CLINICAL HISTORY: Pulmonary embolism (PE) suspected, low to intermediate prob, positive D-dimer. Cough and SOB for the past 3 days. Patient has hx of COPD. FINDINGS: AORTA: No thoracic aortic dissection. No aneurysm. MEDIASTINUM: No mediastinal lymphadenopathy. The heart and pericardium demonstrate no acute abnormality. Remote sternal fracture. LYMPH NODES: No mediastinal, hilar or axillary lymphadenopathy. LUNGS AND PLEURA: Negative for pulmonary embolism. Advanced emphysema. Diffuse bronchial wall thickening. Scattered mucus plugs in the left lower lobe.  Biapical pleural parenchymal scarring. Multiple subpleural nodules, for example on the posterolateral left upper lobe on series 10 image 117 measuring 11 mm and 10 mm respectively, and an 11 x 8 mm nodule in the posterior right lower lobe on series 10 image 25. Scattered centrilobular micronodules and tree-in-bud opacities compatible with small airway infection/inflammation. UPPER ABDOMEN: Limited images of the upper abdomen are unremarkable. SOFT TISSUES AND BONES: No acute bone or soft tissue abnormality. IMPRESSION: 1. No evidence of pulmonary embolism. 2. Advanced emphysema with bronchial inflammation. 3. Multiple subpleural nodules, including 11 mm and 10 mm nodules in the posterolateral left upper lobe and an 11 mm nodule in the posterior right lower lobe. Follow up CT at 3-6 months is recommended. Electronically signed by: Norman Gatlin MD 06/14/2024 12:58 AM EDT RP Workstation: [id number]   DG Chest Portable 1 View Result Date: 06/13/2024 CLINICAL DATA:  Shortness of breath and cough EXAM: PORTABLE CHEST 1 VIEW COMPARISON:  Chest x-ray 01/29/2024. FINDINGS: The lungs are hyperinflated, unchanged. There is atelectasis in the lung bases. There is blunting of the left costophrenic angle, likely scarring. No pneumothorax. Cardiomediastinal silhouette is within normal limits. No acute fractures are seen. IMPRESSION: 1. Hyperinflated lungs, likely COPD. 2. Bibasilar atelectasis. Electronically Signed   By: Greig Pique M.D.   On: 06/13/2024 23:06    EKG: Independently reviewed. Sinus rhythm, PACs.   Assessment/Plan   1. COPD exacerbation; chronic hypoxic respiratory failure  - culture sputum, start antibiotic, continue systemic steroids, continue short-acting bronchodilators, continue supplemental O2    2. Leg swelling  - Pt reports months of b/l LE edema  - BNP is 970; no cardiomegaly, vascular congestion, or edema on CT in ED  - Check albumin and TTE, consider stopping Norvasc  if these are  normal   3. PAD  - S/p angioplasty on left popliteal artery in June 2025  - No acute ischemia  - Continue ASA, Plavix , Lipitor    4. Hypertension  - Norvasc , losartan , propranolol    5. Lung nodules  - Multiple lung nodules noted on CT in ED  - Discussed patient, outpatient follow-up recommended    DVT prophylaxis: Lovenox  Code Status: Full  Level of Care: Level of care: Telemetry Family Communication: none present  Disposition Plan:  Patient is from: home  Anticipated d/c is to: Home  Anticipated d/c date is: 7/17 or 06/16/24  Patient currently: Pending stable respiratory status  Consults called: None  Admission status: observation     Evalene GORMAN Sprinkles, MD [facility]  06/14/2024, 3:49 AM

## 2024-06-14 NOTE — Progress Notes (Signed)
*  PRELIMINARY RESULTS* Echocardiogram 2D Echocardiogram has been performed.  Benard FORBES Stallion 06/14/2024, 3:38 PM

## 2024-06-14 NOTE — Progress Notes (Signed)
 Rapid response called and Bipap ordered for pt.  Pt placed on bipap and is tolerating well.  Rapid response RN at bedside.  Pt transferred on bipap to ICU room 1223.

## 2024-06-14 NOTE — Progress Notes (Signed)
 Patient arrived to 1612 at 0830 with labored breathing. Respiratory Therapist called to assess and Duoneb administered. Patient's VSS, on 2 L [place] SpO2 95%. Rapid called and BIPAP started, Lasix  administered for crackles. Patient successfully moved to [place] for BIPAP and closer supervision.

## 2024-06-14 NOTE — ED Notes (Signed)
 ED TO INPATIENT HANDOFF REPORT  Name/Age/Gender Jessica [name] 89 y.o. female  Code Status    Code Status Orders  (From admission, onward)           Start     Ordered   06/14/24 0348  Full code  Continuous       Question:  By:  Answer:  Consent: discussion documented in EHR   06/14/24 0349           Code Status History     Date Active Date Inactive Code Status Order ID Comments User Context   05/04/2024 1433 05/04/2024 2359 Full Code [id number]  Gretta Lonni PARAS, MD Inpatient   01/30/2024 0220 01/31/2024 2022 Full Code [id number]  Lee Kingfisher, MD ED   05/20/2021 1734 05/22/2021 1934 Full Code [id number]  Bethanie Cough, PA-C Inpatient   05/15/2021 1542 05/15/2021 2233 Full Code [id number]  Gretta Lonni PARAS, MD Inpatient   07/15/2020 0815 07/18/2020 1735 Full Code [id number]  Claudene Maximino LABOR, MD ED   05/15/2020 1607 05/17/2020 1614 Full Code [id number]  Rosendo Nena PARAS, PA-C Inpatient   05/15/2020 1115 05/15/2020 1606 Full Code [id number]  Gretta Lonni PARAS, MD Inpatient   06/27/2015 1247 06/28/2015 2031 Full Code [id number]  Barbra Lang PARAS, DO ED      Advance Directive Documentation    Flowsheet Row Most Recent Value  Type of Advance Directive Living will, Healthcare Power of Attorney, Out of facility DNR (pink MOST or yellow form)  Pre-existing out of facility DNR order (yellow form or pink MOST form) --  MOST Form in Place? --    Home/SNF/Other Home  Chief Complaint COPD with acute exacerbation (HCC) [J44.1]  Level of Care/Admitting Diagnosis ED Disposition     ED Disposition  Admit   Condition  --   Comment  Hospital Area: [facility] [[id number]]  Level of Care: Telemetry [5]  Admit to tele based on following criteria: Monitor QTC interval  May place patient in observation at [facility] or [facility] if equivalent level of care is available:: Yes  Covid Evaluation: Asymptomatic - no recent exposure (last 10 days) testing not required   Diagnosis: COPD with acute exacerbation [facility]) [[id number]]  Admitting Physician: CHARLTON EVALENE RAMAN [[id number]]  Attending Physician: CHARLTON EVALENE RAMAN [[id number]]  For patients discharging to extended facilities (i.e. SNF, AL, group homes or LTAC) initiate:: Discharge to SNF/Facility Placement COVID-19 Lab Testing Protocol          Medical History Past Medical History:  Diagnosis Date   Anemia    Arthritis    thumbs (06/27/2015)   Bilateral renal artery stenosis (HCC)    Cataract    Chronic bronchitis (HCC)    Chronic lower back pain    COPD (chronic obstructive pulmonary disease) (HCC)    GERD (gastroesophageal reflux disease)    Headache    probably weekly (06/27/2015)   Hypertension    Hypertriglyceridemia    Migraine    maybe monthly (06/27/2015)   Pneumonia     Allergies No Known Allergies  IV Location/Drains/Wounds Patient Lines/Drains/Airways Status     Active Line/Drains/Airways     Name Placement date Placement time Site Days   Peripheral IV 06/13/24 20 G 1 Left Antecubital 06/13/24  2310  Antecubital  1            Labs/Imaging Results for orders placed or performed during the hospital encounter of 06/13/24 (from the past 48 hours)  CBC     Status: Abnormal  Collection Time: 06/13/24 11:11 PM  Result Value Ref Range   WBC 12.0 (H) 4.0 - 10.5 K/uL   RBC 3.97 3.87 - 5.11 MIL/uL   Hemoglobin 10.7 (L) 12.0 - 15.0 g/dL   HCT 61.1 63.9 - 53.9 %   MCV 97.7 80.0 - 100.0 fL   MCH 27.0 26.0 - 34.0 pg   MCHC 27.6 (L) 30.0 - 36.0 g/dL   RDW 86.1 88.4 - 84.4 %   Platelets 353 150 - 400 K/uL   nRBC 0.0 0.0 - 0.2 %    Comment: Performed at [facility], [address] [place], [place] [address]  Basic metabolic panel     Status: Abnormal   Collection Time: 06/13/24 11:11 PM  Result Value Ref Range   Sodium 140 135 - 145 mmol/L   Potassium 4.5 3.5 - 5.1 mmol/L   Chloride 99 98 - 111 mmol/L   CO2 31 22 - 32 mmol/L   Glucose, Bld 141 (H) 70 -  99 mg/dL    Comment: Glucose reference range applies only to samples taken after fasting for at least 8 hours.   BUN 17 8 - 23 mg/dL   Creatinine, Ser 9.30 0.44 - 1.00 mg/dL   Calcium  8.6 (L) 8.9 - 10.3 mg/dL   GFR, Estimated >39 >39 mL/min    Comment: (NOTE) Calculated using the CKD-EPI Creatinine Equation (2021)    Anion gap 10 5 - 15    Comment: Performed at [facility], [address], [place], [place] [address]  D-dimer, quantitative     Status: Abnormal   Collection Time: 06/13/24 11:11 PM  Result Value Ref Range   D-Dimer, Quant 1.15 (H) 0.00 - 0.50 ug/mL-FEU    Comment: (NOTE) At the manufacturer cut-off value of 0.5 g/mL FEU, this assay has a negative predictive value of 95-100%.This assay is intended for use in conjunction with a clinical pretest probability (PTP) assessment model to exclude pulmonary embolism (PE) and deep venous thrombosis (DVT) in outpatients suspected of PE or DVT. Results should be correlated with clinical presentation. Performed at [facility] [address], [place], [place] [address]   Brain natriuretic peptide     Status: Abnormal   Collection Time: 06/13/24 11:11 PM  Result Value Ref Range   B Natriuretic Peptide 969.8 (H) 0.0 - 100.0 pg/mL    Comment: Performed at [facility], [address], [place], [place] [address]  Troponin I (High Sensitivity)     Status: None   Collection Time: 06/13/24 11:11 PM  Result Value Ref Range   Troponin I (High Sensitivity) 13 <18 ng/L    Comment: (NOTE) Elevated high sensitivity troponin I (hsTnI) values and significant  changes across serial measurements may suggest ACS but many other  chronic and acute conditions are known to elevate hsTnI results.  Refer to the Links section for chest pain algorithms and additional  guidance. Performed at [facility], [address] [place], [place] [address]   Blood gas, venous (at  [facility])     Status: Abnormal   Collection Time: 06/13/24 11:25 PM  Result Value Ref Range   pH, Ven 7.34 7.25 - 7.43   pCO2, Ven 67 (H) 44 - 60 mmHg   pO2, Ven <31 (LL) 32 - 45 mmHg    Comment: CRITICAL RESULT CALLED TO, READ BACK BY AND VERIFIED WITH: ELSWORTH EDISON RN AT 2343 ON 7.15.25. FA    Bicarbonate 36.1 (  H) 20.0 - 28.0 mmol/L   Acid-Base Excess 8.5 (H) 0.0 - 2.0 mmol/L   O2 Saturation 32.1 %   Patient temperature 37.0     Comment: Performed at [facility], [address] [place], [place] [address]  Basic metabolic panel     Status: Abnormal   Collection Time: 06/14/24  5:05 AM  Result Value Ref Range   Sodium 135 135 - 145 mmol/L   Potassium 4.3 3.5 - 5.1 mmol/L   Chloride 97 (L) 98 - 111 mmol/L   CO2 30 22 - 32 mmol/L   Glucose, Bld 178 (H) 70 - 99 mg/dL    Comment: Glucose reference range applies only to samples taken after fasting for at least 8 hours.   BUN 14 8 - 23 mg/dL   Creatinine, Ser 9.46 0.44 - 1.00 mg/dL   Calcium  8.5 (L) 8.9 - 10.3 mg/dL   GFR, Estimated >39 >39 mL/min    Comment: (NOTE) Calculated using the CKD-EPI Creatinine Equation (2021)    Anion gap 8 5 - 15    Comment: Performed at [facility], [address] [place], [place] [address]  Hepatic function panel     Status: Abnormal   Collection Time: 06/14/24  5:05 AM  Result Value Ref Range   Total Protein 5.9 (L) 6.5 - 8.1 g/dL   Albumin 2.5 (L) 3.5 - 5.0 g/dL   AST 16 15 - 41 U/L   ALT 10 0 - 44 U/L   Alkaline Phosphatase 98 38 - 126 U/L   Total Bilirubin 0.5 0.0 - 1.2 mg/dL   Bilirubin, Direct 0.1 0.0 - 0.2 mg/dL   Indirect Bilirubin 0.4 0.3 - 0.9 mg/dL    Comment: Performed at [facility], [address] [place], [place] [address]  CBC     Status: Abnormal   Collection Time: 06/14/24  5:05 AM  Result Value Ref Range   WBC 10.6 (H) 4.0 - 10.5 K/uL   RBC 3.79 (L) 3.87 - 5.11 MIL/uL   Hemoglobin 10.3 (L) 12.0 - 15.0 g/dL    HCT 63.4 63.9 - 53.9 %   MCV 96.3 80.0 - 100.0 fL   MCH 27.2 26.0 - 34.0 pg   MCHC 28.2 (L) 30.0 - 36.0 g/dL   RDW 86.2 88.4 - 84.4 %   Platelets 310 150 - 400 K/uL   nRBC 0.0 0.0 - 0.2 %    Comment: Performed at [facility], [address], [place], [place] [address]   CT Angio Chest PE W and/or Wo Contrast Result Date: 06/14/2024 EXAM: CTA CHEST AORTA 06/14/2024 12:36:03 AM TECHNIQUE: CTA of the chest was performed after the administration of intravenous contrast. Multiplanar reformatted images are provided for review. MIP images are provided for review. Automated exposure control, iterative reconstruction, and/or weight based adjustment of the mA/kV was utilized to reduce the radiation dose to as low as reasonably achievable. COMPARISON: Chest radiograph 06/13/2024 and CT chest 07/15/2020. CLINICAL HISTORY: Pulmonary embolism (PE) suspected, low to intermediate prob, positive D-dimer. Cough and SOB for the past 3 days. Patient has hx of COPD. FINDINGS: AORTA: No thoracic aortic dissection. No aneurysm. MEDIASTINUM: No mediastinal lymphadenopathy. The heart and pericardium demonstrate no acute abnormality. Remote sternal fracture. LYMPH NODES: No mediastinal, hilar or axillary lymphadenopathy. LUNGS AND PLEURA: Negative for pulmonary embolism. Advanced emphysema. Diffuse bronchial wall thickening. Scattered mucus plugs in the left lower lobe. Biapical pleural parenchymal scarring. Multiple subpleural nodules, for example on  the posterolateral left upper lobe on series 10 image 117 measuring 11 mm and 10 mm respectively, and an 11 x 8 mm nodule in the posterior right lower lobe on series 10 image 25. Scattered centrilobular micronodules and tree-in-bud opacities compatible with small airway infection/inflammation. UPPER ABDOMEN: Limited images of the upper abdomen are unremarkable. SOFT TISSUES AND BONES: No acute bone or soft tissue abnormality. IMPRESSION: 1. No evidence of pulmonary  embolism. 2. Advanced emphysema with bronchial inflammation. 3. Multiple subpleural nodules, including 11 mm and 10 mm nodules in the posterolateral left upper lobe and an 11 mm nodule in the posterior right lower lobe. Follow up CT at 3-6 months is recommended. Electronically signed by: Norman Gatlin MD 06/14/2024 12:58 AM EDT RP Workstation: [id number]   DG Chest Portable 1 View Result Date: 06/13/2024 CLINICAL DATA:  Shortness of breath and cough EXAM: PORTABLE CHEST 1 VIEW COMPARISON:  Chest x-ray 01/29/2024. FINDINGS: The lungs are hyperinflated, unchanged. There is atelectasis in the lung bases. There is blunting of the left costophrenic angle, likely scarring. No pneumothorax. Cardiomediastinal silhouette is within normal limits. No acute fractures are seen. IMPRESSION: 1. Hyperinflated lungs, likely COPD. 2. Bibasilar atelectasis. Electronically Signed   By: Greig Pique M.D.   On: 06/13/2024 23:06    Pending Labs Unresulted Labs (From admission, onward)     Start     Ordered   06/21/24 0500  Creatinine, serum  (enoxaparin  (LOVENOX )    CrCl >/= 30 ml/min)  Weekly,   R     Comments: while on enoxaparin  therapy    06/14/24 0349   06/14/24 0500  Basic metabolic panel  Daily,   R      06/14/24 0349   06/14/24 0500  CBC  Daily,   R      06/14/24 0349   06/14/24 0347  Expectorated Sputum Assessment w Gram Stain, Rflx to Resp Cult  (COPD / Pneumonia / Cellulitis / Lower Extremity Wound (Diabetic Foot Infection))  Once,   R        06/14/24 0349            Vitals/Pain Today's Vitals   06/14/24 0645 06/14/24 0648 06/14/24 0700 06/14/24 0730  BP: (!) 141/59  (!) 126/50 (!) 142/102  Pulse: 60  (!) 55 66  Resp: 19  18 (!) 24  Temp:      TempSrc:      SpO2: 100%  100% 97%  Weight:      Height:      PainSc:  Asleep      Isolation Precautions No active isolations  Medications Medications  aspirin  EC tablet 81 mg (has no administration in time range)  amLODipine  (NORVASC )  tablet 5 mg (has no administration in time range)  atorvastatin  (LIPITOR) tablet 20 mg (has no administration in time range)  losartan  (COZAAR ) tablet 100 mg (has no administration in time range)  propranolol  ER (INDERAL  LA) 24 hr capsule 120 mg (has no administration in time range)  mirtazapine  (REMERON ) tablet 15 mg (has no administration in time range)  pantoprazole  (PROTONIX ) EC tablet 40 mg (has no administration in time range)  clopidogrel  (PLAVIX ) tablet 75 mg (has no administration in time range)  gabapentin  (NEURONTIN ) capsule 100 mg (has no administration in time range)  montelukast  (SINGULAIR ) tablet 10 mg (has no administration in time range)  enoxaparin  (LOVENOX ) injection 30 mg (has no administration in time range)  cefTRIAXone  (ROCEPHIN ) 1 g in sodium chloride  0.9 % 100 mL IVPB (0 g  Intravenous Stopped 06/14/24 0603)  methylPREDNISolone  sodium succinate (SOLU-MEDROL ) 125 mg/2 mL injection 125 mg (125 mg Intravenous Given 06/14/24 0750)    Followed by  predniSONE  (DELTASONE ) tablet 40 mg (has no administration in time range)  ipratropium-albuterol  (DUONEB) 0.5-2.5 (3) MG/3ML nebulizer solution 3 mL (has no administration in time range)  sodium chloride  flush (NS) 0.9 % injection 3 mL (has no administration in time range)  acetaminophen  (TYLENOL ) tablet 650 mg (has no administration in time range)    Or  acetaminophen  (TYLENOL ) suppository 650 mg (has no administration in time range)  senna-docusate (Senokot-S) tablet 1 tablet (has no administration in time range)  ondansetron  (ZOFRAN ) tablet 4 mg (has no administration in time range)    Or  ondansetron  (ZOFRAN ) injection 4 mg (has no administration in time range)  ipratropium-albuterol  (DUONEB) 0.5-2.5 (3) MG/3ML nebulizer solution 3 mL (3 mLs Nebulization Given 06/13/24 2312)  methylPREDNISolone  sodium succinate (SOLU-MEDROL ) 125 mg/2 mL injection 125 mg (125 mg Intravenous Given 06/13/24 2312)  iohexol  (OMNIPAQUE ) 350 MG/ML  injection 60 mL (60 mLs Intravenous Contrast Given 06/14/24 0036)  furosemide  (LASIX ) injection 20 mg (20 mg Intravenous Given 06/14/24 0222)    Mobility non-ambulatory

## 2024-06-14 NOTE — Progress Notes (Signed)
 PROGRESS NOTE    Jessica [name]  FMW:[id number] DOB: 21-Apr-1932 DOA: 06/13/2024 PCP: Nichole Senior, MD   Chief Complaint  Patient presents with   Cough   Shortness of Breath    Brief Narrative:  Patient is a 89 year old female history of COPD, chronic respiratory failure, hypertension, PAD status post angioplasty June 2025 presenting with worsening shortness and productive cough x 3 days.  Patient in the ED noted to be on 3 L nasal cannula, BP stable, noted to have a leukocytosis, D-dimer elevated at 1.15, BNP of 970.  CT angiogram chest done was negative for PE however noticeable for advanced emphysema and multiple lung nodules.  Patient admitted with acute COPD exacerbation.  Patient noted once got to the floor to have increased work of breathing, use of accessory muscles of respiration and subsequently transferred to the stepdown unit placed on BiPAP ABG ordered and patient placed on Pulmicort  and Brovana  nebs in addition to scheduled DuoNebs.   Assessment & Plan:   Principal Problem:   COPD with acute exacerbation (HCC) Active Problems:   Acute respiratory distress   Chronic respiratory failure with hypoxia (HCC)   Essential hypertension   PVD (peripheral vascular disease) (HCC)   Bilateral leg edema   Lung nodules   #1 acute respiratory distress secondary to probable acute COPD exacerbation +/- acute CHF exacerbation -Patient presenting with worsening shortness of breath, productive cough of greenish sputum. - CT angiogram chest done negative for PE however consistent with advanced emphysema with multiple lung nodules. -On admission VBG obtained with a pH of 7.34, pCO2 of 67, PaO2 < 31, bicarb of 36. - Patient with no significant lower extremity edema noted. - Patient noted with increased work of breathing during my assessment this morning. - Will check ABG, transfer to stepdown unit, placed on BiPAP and reassess in 2 to 3 hours. - Lasix  20 mg IV x 1 - Strict I's and O's. -  Place on Pulmicort , Brovana , scheduled DuoNebs, Mucinex , Flonase , Claritin , PPI. -2D echo pending. - Supportive care.  2.  Leg swelling -Patient reported to admitting physician had bilateral lower extremity edema. - BNP noted at 970, no cardiomegaly, vascular congestion or edema noted on CT chest. - 2D echo pending. - If 2D echo within normal limits may consider discontinue patient's home regimen Norvasc .  3.  PAD -Status post angioplasty on left popliteal artery in June 2025. - No acute ischemia noted. - Continue aspirin , Plavix , Lipitor.  4.  Hypertension -Continue losartan , propranolol . - Hold Norvasc  for now.  5.  Lung nodules -Noted on CT chest. - outpatient follow-up recommended with repeat scan in 3 to 6 months.    DVT prophylaxis: Lovenox  Code Status: Full Family Communication: Updated patient, daughter, granddaughter at bedside. Disposition: Transfer to stepdown unit  Status is: Inpatient The patient will require care spanning > 2 midnights and should be moved to inpatient because: Severity of illness   Consultants:  None  Procedures:  CT angiogram chest 06/14/2024 Chest x-ray 06/13/2024   Antimicrobials:  Anti-infectives (From admission, onward)    Start     Dose/Rate Route Frequency Ordered Stop   06/14/24 0400  cefTRIAXone  (ROCEPHIN ) 1 g in sodium chloride  0.9 % 100 mL IVPB        1 g 200 mL/hr over 30 Minutes Intravenous Every 24 hours 06/14/24 0349 06/19/24 0359         Subjective: Patient laying in bed.  With complaints of shortness of breath.  With increased work of breathing  noted.  Denies any chest pain.  No abdominal pain.  Patient states prior to admission had a productive cough of greenish sputum.  Slight improvement with cough this morning.  Objective: Vitals:   06/14/24 0849 06/14/24 1032 06/14/24 1123 06/14/24 1129  BP:    (!) 159/80  Pulse:   81 67  Resp:    (!) 24  Temp:    98 F (36.7 C)  TempSrc:    Axillary  SpO2: 98% 95%  100% 100%  Weight:    32.1 kg  Height:    5' (1.524 m)   No intake or output data in the 24 hours ending 06/14/24 1131 Filed Weights   06/13/24 2213 06/14/24 1129  Weight: 38.6 kg 32.1 kg    Examination:  General exam: Looks in respiratory distress. Respiratory system: Bibasilar crackles.  Poor to fair air movement.  Use of accessory muscles of respiration.no significant wheezing noted.   Cardiovascular system: S1 & S2 heard, RRR. No JVD, murmurs, rubs, gallops or clicks. No pedal edema. Gastrointestinal system: Abdomen is nondistended, soft and nontender. No organomegaly or masses felt. Normal bowel sounds heard. Central nervous system: Alert and oriented. No focal neurological deficits. Extremities: Symmetric 5 x 5 power. Skin: No rashes, lesions or ulcers Psychiatry: Judgement and insight appear normal. Mood & affect appropriate.     Data Reviewed: I have personally reviewed following labs and imaging studies  CBC: Recent Labs  Lab 06/13/24 2311 06/14/24 0505  WBC 12.0* 10.6*  HGB 10.7* 10.3*  HCT 38.8 36.5  MCV 97.7 96.3  PLT 353 310    Basic Metabolic Panel: Recent Labs  Lab 06/13/24 2311 06/14/24 0505  NA 140 135  K 4.5 4.3  CL 99 97*  CO2 31 30  GLUCOSE 141* 178*  BUN 17 14  CREATININE 0.69 0.53  CALCIUM  8.6* 8.5*    GFR: Estimated Creatinine Clearance: 24.2 mL/min (by C-G formula based on SCr of 0.53 mg/dL).  Liver Function Tests: Recent Labs  Lab 06/14/24 0505  AST 16  ALT 10  ALKPHOS 98  BILITOT 0.5  PROT 5.9*  ALBUMIN 2.5*    CBG: No results for input(s): GLUCAP in the last 168 hours.   No results found for this or any previous visit (from the past 240 hours).       Radiology Studies: CT Angio Chest PE W and/or Wo Contrast Result Date: 06/14/2024 EXAM: CTA CHEST AORTA 06/14/2024 12:36:03 AM TECHNIQUE: CTA of the chest was performed after the administration of intravenous contrast. Multiplanar reformatted images are provided  for review. MIP images are provided for review. Automated exposure control, iterative reconstruction, and/or weight based adjustment of the mA/kV was utilized to reduce the radiation dose to as low as reasonably achievable. COMPARISON: Chest radiograph 06/13/2024 and CT chest 07/15/2020. CLINICAL HISTORY: Pulmonary embolism (PE) suspected, low to intermediate prob, positive D-dimer. Cough and SOB for the past 3 days. Patient has hx of COPD. FINDINGS: AORTA: No thoracic aortic dissection. No aneurysm. MEDIASTINUM: No mediastinal lymphadenopathy. The heart and pericardium demonstrate no acute abnormality. Remote sternal fracture. LYMPH NODES: No mediastinal, hilar or axillary lymphadenopathy. LUNGS AND PLEURA: Negative for pulmonary embolism. Advanced emphysema. Diffuse bronchial wall thickening. Scattered mucus plugs in the left lower lobe. Biapical pleural parenchymal scarring. Multiple subpleural nodules, for example on the posterolateral left upper lobe on series 10 image 117 measuring 11 mm and 10 mm respectively, and an 11 x 8 mm nodule in the posterior right lower lobe on series 10  image 25. Scattered centrilobular micronodules and tree-in-bud opacities compatible with small airway infection/inflammation. UPPER ABDOMEN: Limited images of the upper abdomen are unremarkable. SOFT TISSUES AND BONES: No acute bone or soft tissue abnormality. IMPRESSION: 1. No evidence of pulmonary embolism. 2. Advanced emphysema with bronchial inflammation. 3. Multiple subpleural nodules, including 11 mm and 10 mm nodules in the posterolateral left upper lobe and an 11 mm nodule in the posterior right lower lobe. Follow up CT at 3-6 months is recommended. Electronically signed by: Norman Gatlin MD 06/14/2024 12:58 AM EDT RP Workstation: [id number]   DG Chest Portable 1 View Result Date: 06/13/2024 CLINICAL DATA:  Shortness of breath and cough EXAM: PORTABLE CHEST 1 VIEW COMPARISON:  Chest x-ray 01/29/2024. FINDINGS: The lungs  are hyperinflated, unchanged. There is atelectasis in the lung bases. There is blunting of the left costophrenic angle, likely scarring. No pneumothorax. Cardiomediastinal silhouette is within normal limits. No acute fractures are seen. IMPRESSION: 1. Hyperinflated lungs, likely COPD. 2. Bibasilar atelectasis. Electronically Signed   By: Greig Pique M.D.   On: 06/13/2024 23:06        Scheduled Meds:  amLODipine   5 mg Oral Daily   arformoterol   15 mcg Nebulization BID   aspirin  EC  81 mg Oral Daily   atorvastatin   20 mg Oral Daily   budesonide  (PULMICORT ) nebulizer solution  0.5 mg Nebulization BID   Chlorhexidine  Gluconate Cloth  6 each Topical Daily   clopidogrel   75 mg Oral Daily   enoxaparin  (LOVENOX ) injection  30 mg Subcutaneous Q24H   fluticasone   2 spray Each Nare Daily   gabapentin   100 mg Oral QHS   guaiFENesin   1,200 mg Oral BID   ipratropium-albuterol   3 mL Nebulization TID   loratadine   10 mg Oral Daily   losartan   100 mg Oral QPM   methylPREDNISolone  (SOLU-MEDROL ) injection  125 mg Intravenous Q12H   Followed by   NOREEN ON 06/15/2024] predniSONE   40 mg Oral Q breakfast   mirtazapine   15 mg Oral QHS   montelukast   10 mg Oral QHS   mouth rinse  15 mL Mouth Rinse 4 times per day   pantoprazole   40 mg Oral Daily   propranolol  ER  120 mg Oral Daily   sodium chloride  flush  3 mL Intravenous Q12H   Continuous Infusions:  cefTRIAXone  (ROCEPHIN )  IV Stopped (06/14/24 0603)     LOS: 0 days    Time spent: 45 minutes    Toribio Hummer, MD [facility]   To contact the attending provider between 7A-7P or the covering provider during after hours 7P-7A, please log into the web site [email or web address] and access using universal [place] password for that web site. If you do not have the password, please call the hospital operator.  06/14/2024, 11:31 AM

## 2024-06-15 DIAGNOSIS — R600 Localized edema: Secondary | ICD-10-CM | POA: Diagnosis not present

## 2024-06-15 DIAGNOSIS — B370 Candidal stomatitis: Secondary | ICD-10-CM | POA: Diagnosis not present

## 2024-06-15 DIAGNOSIS — R918 Other nonspecific abnormal finding of lung field: Secondary | ICD-10-CM | POA: Diagnosis not present

## 2024-06-15 DIAGNOSIS — I739 Peripheral vascular disease, unspecified: Secondary | ICD-10-CM | POA: Diagnosis not present

## 2024-06-15 DIAGNOSIS — L8990 Pressure ulcer of unspecified site, unspecified stage: Secondary | ICD-10-CM | POA: Insufficient documentation

## 2024-06-15 DIAGNOSIS — K5900 Constipation, unspecified: Secondary | ICD-10-CM | POA: Diagnosis not present

## 2024-06-15 DIAGNOSIS — J9611 Chronic respiratory failure with hypoxia: Secondary | ICD-10-CM | POA: Diagnosis not present

## 2024-06-15 DIAGNOSIS — I10 Essential (primary) hypertension: Secondary | ICD-10-CM | POA: Diagnosis not present

## 2024-06-15 DIAGNOSIS — J441 Chronic obstructive pulmonary disease with (acute) exacerbation: Secondary | ICD-10-CM | POA: Diagnosis not present

## 2024-06-15 LAB — BASIC METABOLIC PANEL WITH GFR
Anion gap: 12 (ref 5–15)
BUN: 15 mg/dL (ref 8–23)
CO2: 29 mmol/L (ref 22–32)
Calcium: 9 mg/dL (ref 8.9–10.3)
Chloride: 98 mmol/L (ref 98–111)
Creatinine, Ser: 0.53 mg/dL (ref 0.44–1.00)
GFR, Estimated: >60 mL/min (ref >60)
Glucose, Bld: 180 mg/dL — ABNORMAL HIGH (ref 70–99)
Potassium: 3.9 mmol/L (ref 3.5–5.1)
Sodium: 139 mmol/L (ref 135–145)

## 2024-06-15 LAB — CBC
HCT: 36.3 % (ref 36.0–46.0)
Hemoglobin: 10.4 g/dL — ABNORMAL LOW (ref 12.0–15.0)
MCH: 27.7 pg (ref 26.0–34.0)
MCHC: 28.7 g/dL — ABNORMAL LOW (ref 30.0–36.0)
MCV: 96.5 fL (ref 80.0–100.0)
Platelets: 308 K/uL (ref 150–400)
RBC: 3.76 MIL/uL — ABNORMAL LOW (ref 3.87–5.11)
RDW: 13.9 % (ref 11.5–15.5)
WBC: 5.3 K/uL (ref 4.0–10.5)
nRBC: 0 % (ref 0.0–0.2)

## 2024-06-15 LAB — MAGNESIUM: Magnesium: 1.8 mg/dL (ref 1.7–2.4)

## 2024-06-15 MED ORDER — LEVALBUTEROL HCL 0.63 MG/3ML IN NEBU
0.6300 mg | INHALATION_SOLUTION | Freq: Three times a day (TID) | RESPIRATORY_TRACT | Status: DC
  Administered 2024-06-15 – 2024-06-17 (×5): 0.63 mg via RESPIRATORY_TRACT
  Filled 2024-06-15 (×5): qty 3

## 2024-06-15 MED ORDER — HYDROXYZINE HCL 10 MG PO TABS
10.0000 mg | ORAL_TABLET | Freq: Three times a day (TID) | ORAL | Status: DC | PRN
  Administered 2024-06-15 – 2024-06-16 (×4): 10 mg via ORAL
  Filled 2024-06-15 (×4): qty 1

## 2024-06-15 MED ORDER — HYDROCODONE-ACETAMINOPHEN 7.5-325 MG PO TABS
1.0000 | ORAL_TABLET | Freq: Every day | ORAL | Status: DC | PRN
  Administered 2024-06-15 – 2024-06-16 (×2): 1 via ORAL
  Filled 2024-06-15 (×3): qty 1

## 2024-06-15 MED ORDER — NYSTATIN 100000 UNIT/ML MT SUSP
5.0000 mL | Freq: Four times a day (QID) | OROMUCOSAL | Status: DC
  Administered 2024-06-15 – 2024-06-17 (×9): 500000 [IU] via ORAL
  Filled 2024-06-15 (×9): qty 5

## 2024-06-15 MED ORDER — IPRATROPIUM BROMIDE 0.02 % IN SOLN
0.5000 mg | Freq: Three times a day (TID) | RESPIRATORY_TRACT | Status: DC
  Administered 2024-06-15 – 2024-06-16 (×3): 0.5 mg via RESPIRATORY_TRACT
  Filled 2024-06-15 (×3): qty 2.5

## 2024-06-15 MED ORDER — MIRTAZAPINE 15 MG PO TABS
30.0000 mg | ORAL_TABLET | Freq: Every day | ORAL | Status: DC
  Administered 2024-06-15 – 2024-06-16 (×2): 30 mg via ORAL
  Filled 2024-06-15 (×2): qty 2

## 2024-06-15 MED ORDER — BISACODYL 5 MG PO TBEC
5.0000 mg | DELAYED_RELEASE_TABLET | Freq: Every day | ORAL | Status: DC
  Administered 2024-06-15 – 2024-06-16 (×2): 5 mg via ORAL
  Filled 2024-06-15 (×3): qty 1

## 2024-06-15 MED ORDER — MAGNESIUM SULFATE 2 GM/50ML IV SOLN
2.0000 g | Freq: Once | INTRAVENOUS | Status: AC
  Administered 2024-06-15: 2 g via INTRAVENOUS
  Filled 2024-06-15: qty 50

## 2024-06-15 MED ORDER — BISACODYL 10 MG RE SUPP
10.0000 mg | Freq: Once | RECTAL | Status: AC
  Administered 2024-06-15: 10 mg via RECTAL
  Filled 2024-06-15: qty 1

## 2024-06-15 MED ORDER — BOOST / RESOURCE BREEZE PO LIQD CUSTOM
1.0000 | Freq: Two times a day (BID) | ORAL | Status: DC
  Administered 2024-06-15 – 2024-06-16 (×4): 1 via ORAL

## 2024-06-15 MED ORDER — POLYETHYLENE GLYCOL 3350 17 G PO PACK
17.0000 g | PACK | Freq: Every day | ORAL | Status: DC
  Administered 2024-06-15 – 2024-06-17 (×3): 17 g via ORAL
  Filled 2024-06-15 (×3): qty 1

## 2024-06-15 MED ORDER — LEVALBUTEROL HCL 0.63 MG/3ML IN NEBU: 0.6300 mg | INHALATION_SOLUTION | RESPIRATORY_TRACT | Status: DC | PRN

## 2024-06-15 MED ORDER — FLUCONAZOLE IN SODIUM CHLORIDE 200-0.9 MG/100ML-% IV SOLN
200.0000 mg | Freq: Every day | INTRAVENOUS | Status: DC
  Administered 2024-06-15 – 2024-06-16 (×2): 200 mg via INTRAVENOUS
  Filled 2024-06-15 (×3): qty 100

## 2024-06-15 NOTE — Progress Notes (Signed)
 BSE completed, full report to follow.  Pt reports gradual progressive dysphagia contributing to weight loss - Note she is s/p endoscopy for concern for esophageal food impaction 01/2024. Pt was placed on a PPI at that time with some improvement per pt- after diagnosed with esophagitis *at are of food impaction per report* otherwise normal.  Pt admits to sensing food lodging in the right side of her throat *pointing high in pharynx* and also in proximal esophagus *pointing just above sternum.  Reports both are equally problematic.  Pt admits if she coughs up food - it is accompanied by secretions. No focal CN deficit apparent but pt does have significant white coating on the left lateral tongue and posterior bilateral - consistent with oral candidiasis. She reports she uses Magic Mouthwash when she had oral candidiasis = which is effective. PO Observed included water, juice, Ensure, applesauce and graham crackers.  Pt presents with multiple sub-swallows *3-4 with each bolus of liquids* followed by weak subtle cough with approx 80% of thin liquids.   Clinically much improved tolerance of Ensure - and pt confirms she coughs less with Ensure making it easier for her to swallow.  SLP questions component of multifactorial dysphagia including pharyngoesophageal (? Prominent CP, ? Zenker's; ? Decreased laryngeal closure) and/or esophageal dysmotility given age and GERD.  Pt asked SLP how this can be fixed - advised to concern for chronic issues given above factors.  Also question access issue as pt reports she does not cook much.  Options discussed including ongoing po with known risk and compensations vs instrumental evaluation. Pt desires to continue po - open to Ensure with meals and to proceed with instrumental WHEN pt has had a BM as she has chronic constipation and has not had BM for 3 days.  Left swallow signs on RN's desk and will follow up.   Madelin POUR, MS [facility] SLP Acute [employer] [phone]

## 2024-06-15 NOTE — Evaluation (Signed)
 Clinical/Bedside Swallow Evaluation Patient Details  Name: Jessica [name] MRN: [id number] Date of Birth: 04-Apr-1932  Today's Date: 06/15/2024 Time: SLP Start Time (ACUTE ONLY): 0840 SLP Stop Time (ACUTE ONLY): 0930 SLP Time Calculation (min) (ACUTE ONLY): 50 min  Past Medical History:  Past Medical History:  Diagnosis Date   Anemia    Arthritis    thumbs (06/27/2015)   Bilateral renal artery stenosis (HCC)    Cataract    Chronic bronchitis (HCC)    Chronic lower back pain    COPD (chronic obstructive pulmonary disease) (HCC)    GERD (gastroesophageal reflux disease)    Headache    probably weekly (06/27/2015)   Hypertension    Hypertriglyceridemia    Migraine    maybe monthly (06/27/2015)   Pneumonia    Past Surgical History:  Past Surgical History:  Procedure Laterality Date   ABDOMINAL AORTOGRAM N/A 05/04/2024   Procedure: ABDOMINAL AORTOGRAM;  Surgeon: Gretta Lonni PARAS, MD;  Location: [facility] INVASIVE CV LAB;  Service: Cardiovascular;  Laterality: N/A;   BALLOON DILATION  1980's?   for renal stenosis   CATARACT EXTRACTION, BILATERAL     ENDARTERECTOMY FEMORAL Right 05/15/2020   Procedure: Right Common Femoral Endarterectomy;  Surgeon: Gretta Lonni PARAS, MD;  Location: [facility] OR;  Service: Vascular;  Laterality: Right;   ESOPHAGOGASTRODUODENOSCOPY N/A 01/29/2024   Procedure: ESOPHAGOGASTRODUODENOSCOPY (EGD);  Surgeon: Nandigam, Kavitha V, MD;  Location: [facility] ENDOSCOPY;  Service: Gastroenterology;  Laterality: N/A;   FEMORAL ARTERY EXPLORATION Left 05/20/2021   Procedure: LEFT LEG THROMBECTOMY WITH LEFT FEMORAL  ENDARTERECTOMY AND PATCH ANGIOPLASTY;  Surgeon: Oris Krystal FALCON, MD;  Location: [facility] OR;  Service: Vascular;  Laterality: Left;   LOWER EXTREMITY ANGIOGRAPHY Left 05/04/2024   Procedure: Lower Extremity Angiography;  Surgeon: Gretta Lonni PARAS, MD;  Location: [facility] INVASIVE CV LAB;  Service: Cardiovascular;  Laterality: Left;   LOWER EXTREMITY INTERVENTION Left 05/04/2024    Procedure: LOWER EXTREMITY INTERVENTION;  Surgeon: Gretta Lonni PARAS, MD;  Location: [facility] INVASIVE CV LAB;  Service: Cardiovascular;  Laterality: Left;   PATCH ANGIOPLASTY Right 05/15/2020   Procedure: Common Femoral Artery Patch Angioplasty using XenoSure Bovine Pericardial Patch;  Surgeon: Gretta Lonni PARAS, MD;  Location: [facility] OR;  Service: Vascular;  Laterality: Right;   PERIPHERAL VASCULAR BALLOON ANGIOPLASTY  05/15/2021   Procedure: PERIPHERAL VASCULAR BALLOON ANGIOPLASTY;  Surgeon: Gretta Lonni PARAS, MD;  Location: [facility] IN[facility] CV [facility];  Service: Cardiovascular;;   Celiac   PERIPHERAL VASCULAR INTERVENTION  05/15/2020   Procedure: PERIPHERAL VASCULAR INTERVENTION;  Surgeon: Gretta Lonni PARAS, MD;  Location: [facility] INVAS[facility];  Service: Cardiovascular;;  Celiac   RECONSTRUCTION OF EYELID Bilateral 01/2016   Upper and lower eyelid   RENAL ANGIOPLASTY     TOE SURGERY Bilateral    straightened big toe   TONSILLECTOMY  ~ 1941   VAGINAL HYSTERECTOMY  1973   VISCERAL ANGIOGRAPHY N/A 05/15/2020   Procedure: MESENTERIC  ANGIOGRAPHY;  Surgeon: Gretta Lonni PARAS, MD;  Location: [facility] INVAS[facility];  Service: Cardiovascular;  Laterality: N/A;   VISCERAL ANGIOGRAPHY N/A 05/15/2021   Procedure: MESENTRIC ANGIOGRAPHY;  Surgeon: Gretta Lonni PARAS, MD;  Location: [facility] INVASIVE CV [facility];  Service: Cardiovascular;  Laterality: N/A;   HPI:  89 yo full code adm with 3 days cough and incr SOB, copd exacerbation, RR tx to ICU, bipap- CT chest left lung mucus plugs, ? Airway infiltration vs atx    Pt with h/o GERD and food impaction s/p endoscopy 01/2024.    Assessment /  Plan / Recommendation  Clinical Impression  Pt reports gradual progressive dysphagia contributing to weight loss - Note she is s/p endoscopy for concern for esophageal food impaction 01/2024. Pt was placed on a PPI at that time with some improvement per pt- after diagnosed with esophagitis *at are of food impaction per report* otherwise  normal.  Pt admits to sensing food lodging in the right side of her throat *pointing high in pharynx* and also in proximal esophagus *pointing just above sternum.  Reports both are equally problematic.  Pt admits if she coughs up food - it is accompanied by secretions- suspect more esophageal.   No focal CN deficit apparent but pt does have significant white coating on the left lateral tongue and posterior bilateral - consistent with oral candidiasis. She reports she uses Magic Mouthwash when she had oral candidiasis = which is effective.   PO Observed included water, juice, Ensure, applesauce and graham crackers.  Pt presents with multiple sub-swallows *3-4 with each bolus of liquids* followed by weak subtle cough with approx 80% of thin liquids.   Clinically much improved tolerance of Ensure - and pt confirms she coughs less with Ensure making it easier for her to swallow.    SLP questions component of multifactorial dysphagia including pharyngoesophageal (? Prominent CP, ? Zenker's; ? Decreased laryngeal closure) and/or esophageal dysmotility given age and GERD.  Pt asked SLP how this can be fixed - advised to concern for chronic issues given above factors.  Also question access issue as pt reports she does not cook much.    Options discussed including ongoing po with known risk and compensations vs instrumental evaluation. Pt desires to continue po - open to Ensure with meals and to proceed with instrumental WHEN pt has had a BM as she has chronic constipation and has not had BM for 3 days.  Left swallow signs on RN's desk and will follow up.      Using teach back, educated pt to precautions and mitigation strategies - advised prefer she drink WATER due to improved tolerance when aspirated - but she wants juices regardless of risk.     SLP Visit Diagnosis: Dysphagia, pharyngoesophageal phase (R13.14);Dysphagia, unspecified (R13.10)    Aspiration Risk  Moderate aspiration risk    Diet  Recommendation Regular;Thin liquid (ensure with meals)    Liquid Administration via: Straw Medication Administration: Whole meds with puree Supervision: Patient able to self feed Compensations: Slow rate;Small sips/bites Postural Changes: Remain upright for at least 30 minutes after po intake;Seated upright at 90 degrees    Other  Recommendations Oral Care Recommendations: Oral care BID     Assistance Recommended at Discharge  tbd  Functional Status Assessment Patient has had a recent decline in their functional status and/or demonstrates limited ability to make significant improvements in function in a reasonable and predictable amount of time  Frequency and Duration min 1 x/week  1 week       Prognosis Prognosis for improved oropharyngeal function: Fair Barriers to Reach Goals: Time post onset      Swallow Study   General Date of Onset: 06/15/24 HPI: 89 yo full code adm with 3 days cough and incr SOB, copd exacerbation, RR tx to ICU, bipap- CT chest left lung mucus plugs, ? Airway infiltration vs atx    Pt with h/o GERD and food impaction s/p endoscopy 01/2024. Type of Study: Bedside Swallow Evaluation Diet Prior to this Study: Regular;Thin liquids (Level 0) Temperature Spikes Noted: No Respiratory Status: Nasal  cannula History of Recent Intubation: No Behavior/Cognition: Alert;Cooperative Oral Cavity Assessment: Other (comment) (white coating on tongue) Oral Care Completed by SLP: Yes Oral Cavity - Dentition: Adequate natural dentition;Other (Comment) (partial) Vision: Functional for self-feeding Self-Feeding Abilities: Able to feed self Patient Positioning: Upright in bed Baseline Vocal Quality: Normal Volitional Cough: Weak Volitional Swallow: Able to elicit    Oral/Motor/Sensory Function Overall Oral Motor/Sensory Function: Within functional limits   Ice Chips Ice chips: Not tested   Thin Liquid Thin Liquid: Impaired Presentation: Self Fed;Straw Pharyngeal  Phase  Impairments: Cough - Delayed;Cough - Immediate;Multiple swallows    Nectar Thick Nectar Thick Liquid: Impaired Presentation: Self Fed;Straw Pharyngeal Phase Impairments: Multiple swallows;Throat Clearing - Delayed   Honey Thick Honey Thick Liquid: Not tested   Puree Puree: Within functional limits Presentation: Self Fed   Solid     Solid: Within functional limits Presentation: Self Fed      Nicolas Emmie Caldron 06/15/2024,10:14 AM  Madelin POUR, MS [facility] SLP Acute Rehab Services Office [phone]

## 2024-06-15 NOTE — Progress Notes (Signed)
 Initial Nutrition Assessment  DOCUMENTATION CODES:   Severe malnutrition in context of chronic illness  INTERVENTION:  - Regular diet per SLP recs.  - Boost Breeze po BID, each supplement provides 250 kcal and 9 grams of protein - Magic cup BID with meals, each supplement provides 290 kcal and 9 grams of protein - Encourage intake of small and frequent meals and snacks in addition to supplements.  - Would recommend appetite stimulant if medically appropriate.   - Reached out to MD. - Monitor weight trends.   NUTRITION DIAGNOSIS:   Severe Malnutrition related to chronic illness (COPD) as evidenced by severe fat depletion, severe muscle depletion.  GOAL:   Patient will meet greater than or equal to 90% of their needs  MONITOR:   PO intake, Supplement acceptance, Weight trends  REASON FOR ASSESSMENT:   Malnutrition Screening Tool, Other (Comment) (very low BMI)    ASSESSMENT:   89 year old female with PMH of COPD, chronic respiratory failure, HTN, PAD s/p angioplasty June 2025 who presented with worsening shortness and productive cough x 3 day. Admitted for COPD exacerbation.  Patient is unsure of a UBW but reports no changes in weight.  Per EMR, patient has been consistently weight at 85# in March, May, June, and during this admission as well. Suspect weight was reported and then copied over. Her weight taken yesterday was 70#. Suspect patient may have lost weight recently but unable to confirm at this time.   Patient admits she has been eating poorly recently. Has no appetite and often experiences early satiety. Tries to eat around 2 meals a day. Does not consume ONS at home as she reports hating Ensure.  Patient reports previously taking a multivitamin, but stopped because she has so many pills to take. She would not like to receive once during admission.   Current appetite remains the same. Patient is documented to have had 5% of breakfast today. Lunch in room at time of  visit and visitors assisting patient with getting set up to eat.  Patient had an SLP eval today, recommended a regular diet.  Encouraged patient to always order 3 meals and eat as much as tolerated of each of meal. Discussed strategies to combat early satiety such as eating small and frequent meals and snacks every 2-3 hours. Encouraged patient to order snack items with each tray to have extra items in between meals to eat.   She is agreeable to try [employer] to support oral intake. Also reached out to MD to see if it would be appropriate to start patient on an appetite stimulant.   Medications reviewed and include: Dulcolax, Miralax , Remeron , Protonix , Prednisone   Labs reviewed: -   NUTRITION - FOCUSED PHYSICAL EXAM:  Flowsheet Row Most Recent Value  Orbital Region Severe depletion  Upper Arm Region Severe depletion  Thoracic and Lumbar Region Severe depletion  Buccal Region Moderate depletion  Temple Region Severe depletion  Clavicle Bone Region Severe depletion  Clavicle and Acromion Bone Region Severe depletion  Scapular Bone Region Unable to assess  Dorsal Hand Severe depletion  Patellar Region Severe depletion  Anterior Thigh Region Severe depletion  Posterior Calf Region Severe depletion  Edema (RD Assessment) None  Hair Reviewed  Eyes Reviewed  Mouth Reviewed  [partial dentures]  Skin Reviewed  Nails Reviewed    Diet Order:   Diet Order             Diet regular Room service appropriate? Yes with Assist; Fluid consistency:  Thin  Diet effective now                   EDUCATION NEEDS:  Education needs have been addressed  Skin:  Skin Assessment: Skin Integrity Issues: Skin Integrity Issues:: Stage I Stage I: Medial Buttocks  Last BM:  7/17 - type 3  Height:  Ht Readings from Last 1 Encounters:  06/14/24 5' (1.524 m)   Weight:  Wt Readings from Last 1 Encounters:  06/14/24 32.1 kg   Ideal Body Weight:  45.45 kg  BMI:  Body mass  index is 13.82 kg/m.  Estimated Nutritional Needs:  Kcal:  1450-1600 kcals Protein:  65-75 grams Fluid:  >/= 1.5L    Trude Ned RD, LDN Contact via Secure Chat.

## 2024-06-15 NOTE — Progress Notes (Addendum)
 PROGRESS NOTE    Jessica [name]  FMW:[id number] DOB: 17-Feb-1932 DOA: 06/13/2024 PCP: Nichole Senior, MD   Chief Complaint  Patient presents with   Cough   Shortness of Breath    Brief Narrative:  Patient is a 89 year old female history of COPD, chronic respiratory failure, hypertension, PAD status post angioplasty June 2025 presenting with worsening shortness and productive cough x 3 days.  Patient in the ED noted to be on 3 L nasal cannula, BP stable, noted to have a leukocytosis, D-dimer elevated at 1.15, BNP of 970.  CT angiogram chest done was negative for PE however noticeable for advanced emphysema and multiple lung nodules.  Patient admitted with acute COPD exacerbation.  Patient noted once got to the floor to have increased work of breathing, use of accessory muscles of respiration and subsequently transferred to the stepdown unit placed on BiPAP ABG ordered and patient placed on Pulmicort  and Brovana  nebs in addition to scheduled DuoNebs.   Assessment & Plan:   Principal Problem:   COPD with acute exacerbation (HCC) Active Problems:   Acute respiratory distress   Chronic respiratory failure with hypoxia (HCC)   Essential hypertension   PVD (peripheral vascular disease) (HCC)   Bilateral leg edema   Lung nodules   Oral thrush   Constipation   Pressure injury of skin   #1 acute respiratory distress secondary to probable acute COPD exacerbation +/- acute CHF exacerbation -Patient presenting with worsening shortness of breath, productive cough of greenish sputum. - CT angiogram chest done negative for PE however consistent with advanced emphysema with multiple lung nodules. -On admission VBG obtained with a pH of 7.34, pCO2 of 67, PaO2 < 31, bicarb of 36. - Patient with no significant lower extremity edema noted. - Patient noted with increased work of breathing on assessment the morning of 06/14/2024.   - Patient transferred to stepdown unit, placed on BiPAP and also received  Lasix  20 mg IV x 1.   - Patient with clinical improvement, improved work of breathing. - Continue Pulmicort , Brovana , Mucinex , Flonase , Claritin , PPI, IV Rocephin . -Continue steroid taper. -Change DuoNebs to scheduled Xopenex  and Atrovent  nebs as patient feels albuterol  makes her anxious. -2D echo with EF of 60 to 65%, NWMA, grade 1 DD, intraventricular septum is flattened in systole consistent with right ventricular pressure overload.  Mild MVR.  - Supportive care.  2.  Leg swelling -Patient reported to admitting physician had bilateral lower extremity edema. - BNP noted at 970, no cardiomegaly, vascular congestion or edema noted on CT chest. - 2D echo with EF of 60 to 65%, NWMA, grade 1 DD, intraventricular septum is flattened in systole consistent with right ventricular pressure overload.  Mild MVR.  -Continue to hold home regimen Norvasc  and may consider discontinuing on discharge..  3.  PAD -Status post angioplasty on left popliteal artery in June 2025. - No acute ischemia noted. - Continue aspirin , Plavix , Lipitor.  4.  Hypertension - Propranolol , losartan . - Continue to hold Norvasc .   5.  Lung nodules -Noted on CT chest. - outpatient follow-up recommended with repeat scan in 3 to 6 months.  6.  Oral thrush -Place on nystatin  swish and swallow. - IV Diflucan .  7.  Constipation -Will collect suppository x 1. - Placed on MiraLAX  daily, Senokot as daily.  8.  Dysphagia - SLP following    DVT prophylaxis: Lovenox  Code Status: Full Family Communication: Updated patient, no family at bedside. Disposition: Transfer to stepdown unit  Status is: Inpatient The  patient will require care spanning > 2 midnights and should be moved to inpatient because: Severity of illness   Consultants:  None  Procedures:  CT angiogram chest 06/14/2024 Chest x-ray 06/13/2024 2D echo 06/14/2024  Antimicrobials:  Anti-infectives (From admission, onward)    Start     Dose/Rate Route  Frequency Ordered Stop   06/15/24 1200  fluconazole  (DIFLUCAN ) IVPB 200 mg        200 mg 100 mL/hr over 60 Minutes Intravenous Daily 06/15/24 0957 06/22/24 0959   06/14/24 0400  cefTRIAXone  (ROCEPHIN ) 1 g in sodium chloride  0.9 % 100 mL IVPB        1 g 200 mL/hr over 30 Minutes Intravenous Every 24 hours 06/14/24 0349 06/19/24 0359         Subjective: Patient laying in bed.  States some improvement with shortness of breath.  Feels albuterol  makes her anxious and panicked and panting.  Denies any wheezing.  States she has thrush.  Complain of constipation.  Noted to have been on BiPAP overnight.   Objective: Vitals:   06/15/24 0402 06/15/24 0500 06/15/24 0600 06/15/24 0800  BP:  (!) 171/61 (!) 141/37   Pulse:  64 70   Resp:  13 14   Temp: 97.8 F (36.6 C)   98.3 F (36.8 C)  TempSrc: Axillary   Oral  SpO2:  100% 100%   Weight:      Height:        Intake/Output Summary (Last 24 hours) at 06/15/2024 1009 Last data filed at 06/15/2024 0518 Gross per 24 hour  Intake 319.41 ml  Output 650 ml  Net -330.59 ml   Filed Weights   06/13/24 2213 06/14/24 1129  Weight: 38.6 kg 32.1 kg    Examination:  General exam: NAD.  Oral thrush. Respiratory system: Left basilar crackles.  No significant wheezing.  Fair air movement.  No use of accessory muscles of respiration.  Speaking in full sentences.  Cardiovascular system: Regular rate rhythm no murmurs rubs or gallops.  No JVD.  No lower extremity edema. Gastrointestinal system: Abdomen is nondistended, soft and nontender. No organomegaly or masses felt. Normal bowel sounds heard. Central nervous system: Alert and oriented. No focal neurological deficits. Extremities: Symmetric 5 x 5 power. Skin: No rashes, lesions or ulcers Psychiatry: Judgement and insight appear normal. Mood & affect appropriate.     Data Reviewed: I have personally reviewed following labs and imaging studies  CBC: Recent Labs  Lab 06/13/24 2311  06/14/24 0505 06/15/24 0248  WBC 12.0* 10.6* 5.3  HGB 10.7* 10.3* 10.4*  HCT 38.8 36.5 36.3  MCV 97.7 96.3 96.5  PLT 353 310 308    Basic Metabolic Panel: Recent Labs  Lab 06/13/24 2311 06/14/24 0505 06/15/24 0248  NA 140 135 139  K 4.5 4.3 3.9  CL 99 97* 98  CO2 31 30 29   GLUCOSE 141* 178* 180*  BUN 17 14 15   CREATININE 0.69 0.53 0.53  CALCIUM  8.6* 8.5* 9.0  MG  --   --  1.8    GFR: Estimated Creatinine Clearance: 24.2 mL/min (by C-G formula based on SCr of 0.53 mg/dL).  Liver Function Tests: Recent Labs  Lab 06/14/24 0505  AST 16  ALT 10  ALKPHOS 98  BILITOT 0.5  PROT 5.9*  ALBUMIN 2.5*    CBG: No results for input(s): GLUCAP in the last 168 hours.   Recent Results (from the past 240 hours)  MRSA Next Gen by PCR, Nasal  Status: None   Collection Time: 06/14/24 12:09 PM   Specimen: Nasal Mucosa; Nasal Swab  Result Value Ref Range Status   MRSA by PCR Next Gen NOT DETECTED NOT DETECTED Final    Comment: (NOTE) The GeneXpert MRSA Assay (FDA approved for NASAL specimens only), is one component of a comprehensive MRSA colonization surveillance program. It is not intended to diagnose MRSA infection nor to guide or monitor treatment for MRSA infections. Test performance is not FDA approved in patients less than 96 years old. Performed at [facility], [address] [place], [place] [address]          Radiology Studies: ECHOCARDIOGRAM COMPLETE Result Date: 06/14/2024    ECHOCARDIOGRAM REPORT   Patient Name:   LYNNITA SOMMA Ridge Date of Exam: 06/14/2024 Medical Rec #:  [id number]    Height:       60.0 in Accession #:    [id number]   Weight:       70.8 lb Date of Birth:  [birth year]     BSA:          1.200 m Patient Age:    89 years     BP:           142/102 mmHg Patient Gender: F            HR:           107 bpm. Exam Location:  Inpatient Procedure: 2D Echo, Color Doppler and Cardiac Doppler (Both Spectral and Color            Flow Doppler  were utilized during procedure). Indications:    Dyspnea  History:        Patient has prior history of Echocardiogram examinations, most                 recent 06/28/2015. Risk Factors:Hypertension.  Sonographer:    Benard Stallion Referring Phys: [id number] TIMOTHY S OPYD IMPRESSIONS  1. Left ventricular ejection fraction, by estimation, is 60 to 65%. The left ventricle has normal function. The left ventricle has no regional wall motion abnormalities. Left ventricular diastolic parameters are consistent with Grade I diastolic dysfunction (impaired relaxation). There is the interventricular septum is flattened in systole, consistent with right ventricular pressure overload.  2. Right ventricular systolic function is normal. The right ventricular size is normal. There is moderately elevated pulmonary artery systolic pressure.  3. The mitral valve is normal in structure. Mild mitral valve regurgitation. No evidence of mitral stenosis.  4. The aortic valve is calcified. Aortic valve regurgitation is not visualized. Aortic valve sclerosis/calcification is present, without any evidence of aortic stenosis. FINDINGS  Left Ventricle: Left ventricular ejection fraction, by estimation, is 60 to 65%. The left ventricle has normal function. The left ventricle has no regional wall motion abnormalities. The left ventricular internal cavity size was normal in size. There is  no left ventricular hypertrophy. The interventricular septum is flattened in systole, consistent with right ventricular pressure overload. Left ventricular diastolic parameters are consistent with Grade I diastolic dysfunction (impaired relaxation). Right Ventricle: The right ventricular size is normal. No increase in right ventricular wall thickness. Right ventricular systolic function is normal. There is moderately elevated pulmonary artery systolic pressure. The tricuspid regurgitant velocity is 3.43 m/s, and with an assumed right atrial pressure of 8 mmHg,  the estimated right ventricular systolic pressure is 55.1 mmHg. Left Atrium: Left atrial size was normal in size. Right Atrium: Right atrial size was normal in size. Pericardium: There is  no evidence of pericardial effusion. Mitral Valve: The mitral valve is normal in structure. Mild mitral valve regurgitation. No evidence of mitral valve stenosis. Tricuspid Valve: The tricuspid valve is normal in structure. Tricuspid valve regurgitation is not demonstrated. No evidence of tricuspid stenosis. Aortic Valve: The aortic valve is calcified. Aortic valve regurgitation is not visualized. Aortic valve sclerosis/calcification is present, without any evidence of aortic stenosis. Aortic valve mean gradient measures 5.0 mmHg. Aortic valve peak gradient measures 8.4 mmHg. Aortic valve area, by VTI measures 2.13 cm. Pulmonic Valve: The pulmonic valve was normal in structure. Pulmonic valve regurgitation is not visualized. No evidence of pulmonic stenosis. Aorta: The aortic root is normal in size and structure. IAS/Shunts: No atrial level shunt detected by color flow Doppler.  LEFT VENTRICLE PLAX 2D LVIDd:         2.40 cm   Diastology LVIDs:         1.40 cm   LV e' medial:    2.83 cm/s LV PW:         0.90 cm   LV E/e' medial:  27.2 LV IVS:        0.90 cm   LV e' lateral:   4.03 cm/s LVOT diam:     2.10 cm   LV E/e' lateral: 19.1 LV SV:         66 LV SV Index:   55 LVOT Area:     3.46 cm  RIGHT VENTRICLE RV Basal diam:  3.30 cm RV Mid diam:    2.60 cm LEFT ATRIUM             Index        RIGHT ATRIUM          Index LA diam:        1.90 cm 1.58 cm/m   RA Area:     8.82 cm LA Vol (A2C):   29.3 ml 24.41 ml/m  RA Volume:   18.60 ml 15.50 ml/m LA Vol (A4C):   23.5 ml 19.58 ml/m LA Biplane Vol: 28.2 ml 23.49 ml/m  AORTIC VALVE AV Area (Vmax):    2.34 cm AV Area (Vmean):   2.20 cm AV Area (VTI):     2.13 cm AV Vmax:           145.00 cm/s AV Vmean:          99.100 cm/s AV VTI:            0.310 m AV Peak Grad:      8.4 mmHg AV  Mean Grad:      5.0 mmHg LVOT Vmax:         97.90 cm/s LVOT Vmean:        63.000 cm/s LVOT VTI:          0.191 m LVOT/AV VTI ratio: 0.62  AORTA Ao Root diam: 2.60 cm MITRAL VALVE                TRICUSPID VALVE MV Area (PHT): 2.24 cm     TR Peak grad:   47.1 mmHg MV Decel Time: 338 msec     TR Vmax:        343.00 cm/s MV E velocity: 77.10 cm/s MV A velocity: 125.00 cm/s  SHUNTS MV E/A ratio:  0.62         Systemic VTI:  0.19 m  Systemic Diam: 2.10 cm Dub Tobb DO Electronically signed by Dub Huntsman DO Signature Date/Time: 06/14/2024/4:08:56 PM    Final    CT Angio Chest PE W and/or Wo Contrast Result Date: 06/14/2024 EXAM: CTA CHEST AORTA 06/14/2024 12:36:03 AM TECHNIQUE: CTA of the chest was performed after the administration of intravenous contrast. Multiplanar reformatted images are provided for review. MIP images are provided for review. Automated exposure control, iterative reconstruction, and/or weight based adjustment of the mA/kV was utilized to reduce the radiation dose to as low as reasonably achievable. COMPARISON: Chest radiograph 06/13/2024 and CT chest 07/15/2020. CLINICAL HISTORY: Pulmonary embolism (PE) suspected, low to intermediate prob, positive D-dimer. Cough and SOB for the past 3 days. Patient has hx of COPD. FINDINGS: AORTA: No thoracic aortic dissection. No aneurysm. MEDIASTINUM: No mediastinal lymphadenopathy. The heart and pericardium demonstrate no acute abnormality. Remote sternal fracture. LYMPH NODES: No mediastinal, hilar or axillary lymphadenopathy. LUNGS AND PLEURA: Negative for pulmonary embolism. Advanced emphysema. Diffuse bronchial wall thickening. Scattered mucus plugs in the left lower lobe. Biapical pleural parenchymal scarring. Multiple subpleural nodules, for example on the posterolateral left upper lobe on series 10 image 117 measuring 11 mm and 10 mm respectively, and an 11 x 8 mm nodule in the posterior right lower lobe on series 10 image  25. Scattered centrilobular micronodules and tree-in-bud opacities compatible with small airway infection/inflammation. UPPER ABDOMEN: Limited images of the upper abdomen are unremarkable. SOFT TISSUES AND BONES: No acute bone or soft tissue abnormality. IMPRESSION: 1. No evidence of pulmonary embolism. 2. Advanced emphysema with bronchial inflammation. 3. Multiple subpleural nodules, including 11 mm and 10 mm nodules in the posterolateral left upper lobe and an 11 mm nodule in the posterior right lower lobe. Follow up CT at 3-6 months is recommended. Electronically signed by: Norman Gatlin MD 06/14/2024 12:58 AM EDT RP Workstation: [id number]   DG Chest Portable 1 View Result Date: 06/13/2024 CLINICAL DATA:  Shortness of breath and cough EXAM: PORTABLE CHEST 1 VIEW COMPARISON:  Chest x-ray 01/29/2024. FINDINGS: The lungs are hyperinflated, unchanged. There is atelectasis in the lung bases. There is blunting of the left costophrenic angle, likely scarring. No pneumothorax. Cardiomediastinal silhouette is within normal limits. No acute fractures are seen. IMPRESSION: 1. Hyperinflated lungs, likely COPD. 2. Bibasilar atelectasis. Electronically Signed   By: Greig Pique M.D.   On: 06/13/2024 23:06        Scheduled Meds:  amLODipine   5 mg Oral Daily   arformoterol   15 mcg Nebulization BID   aspirin  EC  81 mg Oral Daily   atorvastatin   20 mg Oral Daily   bisacodyl   5 mg Oral Daily   bisacodyl   10 mg Rectal Once   budesonide  (PULMICORT ) nebulizer solution  0.5 mg Nebulization BID   Chlorhexidine  Gluconate Cloth  6 each Topical Daily   clopidogrel   75 mg Oral Daily   enoxaparin  (LOVENOX ) injection  30 mg Subcutaneous Q24H   fluticasone   2 spray Each Nare Daily   gabapentin   100 mg Oral QHS   guaiFENesin   1,200 mg Oral BID   ipratropium-albuterol   3 mL Nebulization TID   loratadine   10 mg Oral Daily   losartan   100 mg Oral QPM   mirtazapine   15 mg Oral QHS   montelukast   10 mg Oral QHS    nystatin   5 mL Oral QID   mouth rinse  15 mL Mouth Rinse 4 times per day   pantoprazole   40 mg Oral Daily   predniSONE   40 mg Oral Q breakfast   propranolol  ER  120 mg Oral Daily   sodium chloride  flush  3 mL Intravenous Q12H   Continuous Infusions:  cefTRIAXone  (ROCEPHIN )  IV Stopped (06/15/24 0454)   fluconazole  (DIFLUCAN ) IV       LOS: 1 day    Time spent: 45 minutes    Toribio Hummer, MD [facility]   To contact the attending provider between 7A-7P or the covering provider during after hours 7P-7A, please log into the web site [email or web address] and access using universal [place] password for that web site. If you do not have the password, please call the hospital operator.  06/15/2024, 10:09 AM

## 2024-06-16 ENCOUNTER — Inpatient Hospital Stay (HOSPITAL_COMMUNITY)

## 2024-06-16 DIAGNOSIS — R918 Other nonspecific abnormal finding of lung field: Secondary | ICD-10-CM | POA: Diagnosis not present

## 2024-06-16 DIAGNOSIS — K5900 Constipation, unspecified: Secondary | ICD-10-CM | POA: Diagnosis not present

## 2024-06-16 DIAGNOSIS — J449 Chronic obstructive pulmonary disease, unspecified: Secondary | ICD-10-CM | POA: Diagnosis not present

## 2024-06-16 DIAGNOSIS — R600 Localized edema: Secondary | ICD-10-CM | POA: Diagnosis not present

## 2024-06-16 DIAGNOSIS — J441 Chronic obstructive pulmonary disease with (acute) exacerbation: Secondary | ICD-10-CM | POA: Diagnosis not present

## 2024-06-16 LAB — CBC
HCT: 34 % — ABNORMAL LOW (ref 36.0–46.0)
Hemoglobin: 9.7 g/dL — ABNORMAL LOW (ref 12.0–15.0)
MCH: 27.6 pg (ref 26.0–34.0)
MCHC: 28.5 g/dL — ABNORMAL LOW (ref 30.0–36.0)
MCV: 96.6 fL (ref 80.0–100.0)
Platelets: 323 K/uL (ref 150–400)
RBC: 3.52 MIL/uL — ABNORMAL LOW (ref 3.87–5.11)
RDW: 14.1 % (ref 11.5–15.5)
WBC: 8.4 K/uL (ref 4.0–10.5)
nRBC: 0 % (ref 0.0–0.2)

## 2024-06-16 LAB — BLOOD GAS, ARTERIAL
Acid-Base Excess: 3.1 mmol/L — ABNORMAL HIGH (ref 0.0–2.0)
Bicarbonate: 27 mmol/L (ref 20.0–28.0)
Drawn by: 20012
FIO2: 28 %
O2 Content: 2 L/min
O2 Saturation: 99.1 %
Patient temperature: 36.5
pCO2 arterial: 37 mmHg (ref 32–48)
pH, Arterial: 7.47 — ABNORMAL HIGH (ref 7.35–7.45)
pO2, Arterial: 93 mmHg (ref 83–108)

## 2024-06-16 LAB — BASIC METABOLIC PANEL WITH GFR
Anion gap: 10 (ref 5–15)
BUN: 20 mg/dL (ref 8–23)
CO2: 29 mmol/L (ref 22–32)
Calcium: 8.5 mg/dL — ABNORMAL LOW (ref 8.9–10.3)
Chloride: 100 mmol/L (ref 98–111)
Creatinine, Ser: 0.52 mg/dL (ref 0.44–1.00)
GFR, Estimated: >60 mL/min (ref >60)
Glucose, Bld: 129 mg/dL — ABNORMAL HIGH (ref 70–99)
Potassium: 3.7 mmol/L (ref 3.5–5.1)
Sodium: 139 mmol/L (ref 135–145)

## 2024-06-16 LAB — BRAIN NATRIURETIC PEPTIDE: B Natriuretic Peptide: 755.6 pg/mL — ABNORMAL HIGH (ref 0.0–100.0)

## 2024-06-16 LAB — MAGNESIUM: Magnesium: 2 mg/dL (ref 1.7–2.4)

## 2024-06-16 MED ORDER — HEPARIN SODIUM (PORCINE) 5000 UNIT/ML IJ SOLN
5000.0000 [IU] | Freq: Two times a day (BID) | INTRAMUSCULAR | Status: DC
  Administered 2024-06-17: 5000 [IU] via SUBCUTANEOUS
  Filled 2024-06-16: qty 1

## 2024-06-16 MED ORDER — ALPRAZOLAM 0.25 MG PO TABS
0.2500 mg | ORAL_TABLET | Freq: Three times a day (TID) | ORAL | Status: DC | PRN
  Administered 2024-06-16 (×2): 0.25 mg via ORAL
  Filled 2024-06-16 (×2): qty 1

## 2024-06-16 MED ORDER — HYDRALAZINE HCL 20 MG/ML IJ SOLN
5.0000 mg | Freq: Once | INTRAMUSCULAR | Status: AC
  Administered 2024-06-16: 5 mg via INTRAVENOUS
  Filled 2024-06-16: qty 1

## 2024-06-16 MED ORDER — REVEFENACIN 175 MCG/3ML IN SOLN
175.0000 ug | Freq: Every day | RESPIRATORY_TRACT | Status: DC
  Administered 2024-06-17: 175 ug via RESPIRATORY_TRACT
  Filled 2024-06-16 (×2): qty 3

## 2024-06-16 MED ORDER — MORPHINE SULFATE (PF) 2 MG/ML IV SOLN
0.5000 mg | INTRAVENOUS | Status: DC | PRN
  Administered 2024-06-16 – 2024-06-17 (×4): 0.5 mg via INTRAVENOUS
  Administered 2024-06-17: 1 mg via INTRAVENOUS
  Administered 2024-06-17: 0.5 mg via INTRAVENOUS
  Filled 2024-06-16 (×6): qty 1

## 2024-06-16 MED ORDER — FUROSEMIDE 10 MG/ML IJ SOLN
40.0000 mg | Freq: Every day | INTRAMUSCULAR | Status: DC
  Administered 2024-06-16 – 2024-06-17 (×2): 40 mg via INTRAVENOUS
  Filled 2024-06-16 (×2): qty 4

## 2024-06-16 MED ORDER — HYDRALAZINE HCL 20 MG/ML IJ SOLN
5.0000 mg | Freq: Three times a day (TID) | INTRAMUSCULAR | Status: DC | PRN
  Administered 2024-06-17: 5 mg via INTRAVENOUS
  Filled 2024-06-16: qty 1

## 2024-06-16 NOTE — Plan of Care (Signed)
  Problem: Education: Goal: Knowledge of General Education information will improve Description: Including pain rating scale, medication(s)/side effects and non-pharmacologic comfort measures Outcome: Progressing   Problem: Health Behavior/Discharge Planning: Goal: Ability to manage health-related needs will improve Outcome: Progressing   Problem: Clinical Measurements: Goal: Ability to maintain clinical measurements within normal limits will improve Outcome: Progressing Goal: Will remain free from infection Outcome: Progressing Goal: Diagnostic test results will improve Outcome: Progressing Goal: Respiratory complications will improve Outcome: Progressing Goal: Cardiovascular complication will be avoided Outcome: Progressing   Problem: Activity: Goal: Risk for activity intolerance will decrease Outcome: Progressing   Problem: Coping: Goal: Level of anxiety will decrease Outcome: Progressing   Problem: Elimination: Goal: Will not experience complications related to bowel motility Outcome: Progressing Goal: Will not experience complications related to urinary retention Outcome: Progressing   Problem: Pain Managment: Goal: General experience of comfort will improve and/or be controlled Outcome: Progressing   Problem: Safety: Goal: Ability to remain free from injury will improve Outcome: Progressing   Problem: Skin Integrity: Goal: Risk for impaired skin integrity will decrease Outcome: Progressing   Problem: Education: Goal: Knowledge of disease or condition will improve Outcome: Progressing Goal: Knowledge of the prescribed therapeutic regimen will improve Outcome: Progressing Goal: Individualized Educational Video(s) Outcome: Progressing   Problem: Respiratory: Goal: Ability to maintain a clear airway will improve Outcome: Progressing Goal: Levels of oxygenation will improve Outcome: Progressing Goal: Ability to maintain adequate ventilation will  improve Outcome: Progressing   Problem: Nutrition: Goal: Adequate nutrition will be maintained Outcome: Not Progressing   Problem: Activity: Goal: Ability to tolerate increased activity will improve Outcome: Not Progressing

## 2024-06-16 NOTE — Progress Notes (Signed)
 OT Cancellation Note  Patient Details Name: Jessica [name] MRN: [id number] DOB: December 24, 1931   Cancelled Treatment:    Reason Eval/Treat Not Completed: Patient declined, stating she was feeling too short of breath and did not want to exert herself.    Delanna LITTIE Molt, OTR/L 06/16/2024, 4:46 PM

## 2024-06-16 NOTE — Progress Notes (Addendum)
 PROGRESS NOTE    Jessica [name]  FMW:[id number] DOB: [birth year] DOA: 06/13/2024 PCP: Nichole Senior, MD   Chief Complaint  Patient presents with   Cough   Shortness of Breath    Brief Narrative:  Patient is a 89 year old female history of COPD, chronic respiratory failure, hypertension, PAD status post angioplasty June 2025 presenting with worsening shortness and productive cough x 3 days.  Patient in the ED noted to be on 3 L nasal cannula, BP stable, noted to have a leukocytosis, D-dimer elevated at 1.15, BNP of 970.  CT angiogram chest done was negative for PE however noticeable for advanced emphysema and multiple lung nodules.  Patient admitted with acute COPD exacerbation.  Patient noted once got to the floor to have increased work of breathing, use of accessory muscles of respiration and subsequently transferred to the stepdown unit placed on BiPAP ABG ordered and patient placed on Pulmicort  and Brovana  nebs in addition to scheduled DuoNebs.   Assessment & Plan:   Principal Problem:   COPD with acute exacerbation (HCC) Active Problems:   Acute respiratory distress   Chronic respiratory failure with hypoxia (HCC)   Essential hypertension   PVD (peripheral vascular disease) (HCC)   Bilateral leg edema   Lung nodules   Oral thrush   Constipation   Pressure injury of skin   #1 acute respiratory distress secondary to probable acute COPD exacerbation +/- acute CHF exacerbation -Patient presenting with worsening shortness of breath, productive cough of greenish sputum. - CT angiogram chest done negative for PE however consistent with advanced emphysema with multiple lung nodules. -On admission VBG obtained with a pH of 7.34, pCO2 of 67, PaO2 < 31, bicarb of 36. - Patient with no significant lower extremity edema noted. - Patient noted with increased work of breathing on assessment the morning of 06/14/2024.   - Patient transferred to stepdown unit, placed on BiPAP and also received  Lasix  20 mg IV x 1.   - Patient with clinical improvement, improved work of breathing. -Patient however feels breathing gets worse with panting after breathing treatments and is insistent that she does not feel significant improvement since admission. --2D echo with EF of 60 to 65%, NWMA, grade 1 DD, intraventricular septum is flattened in systole consistent with right ventricular pressure overload.  Mild MVR.  - Continue Pulmicort , Brovana , Mucinex , Flonase , Claritin , PPI, IV Rocephin , Xopenex  and Atrovent  nebs. -Continue steroid taper. -Place on Lasix  40 mg IV daily. -CXR, check BNP. -Strict I's and O's, daily weights. - Supportive care. -BiPAP nightly, BiPAP as needed. - Will consult with pulmonary for further input and recommendations as patient feels breathing treatment is making her breathing worse.  2.  Leg swelling -Patient reported to admitting physician had bilateral lower extremity edema. - BNP noted at 970, no cardiomegaly, vascular congestion or edema noted on CT chest. - 2D echo with EF of 60 to 65%, NWMA, grade 1 DD, intraventricular septum is flattened in systole consistent with right ventricular pressure overload.  Mild MVR.  - No significant leg swelling noted on exam.   - Home regimen Norvasc  resumed.   3.  PAD -Status post angioplasty on left popliteal artery in June 2025. - No acute ischemia noted. - Continue aspirin , Plavix , Lipitor.  4.  Hypertension - Continue propranolol , losartan , Norvasc .   - Patient to receive IV Lasix  today.   5.  Lung nodules -Noted on CT chest. - outpatient follow-up recommended with repeat scan in 3 to 6 months.  6.  Oral thrush - Continue nystatin  swish and swallow, Diflucan .   7.  Constipation - Continue current bowel regimen of MiraLAX  daily, Dulcolax daily.  8.  Dysphagia - SLP following    DVT prophylaxis: Lovenox  Code Status: Full Family Communication: Updated patient, no family at bedside. Disposition: Remain in  stepdown unit.  Status is: Inpatient The patient will require care spanning > 2 midnights and should be moved to inpatient because: Severity of illness   Consultants:  None  Procedures:  CT angiogram chest 06/14/2024 Chest x-ray 06/13/2024 2D echo 06/14/2024  Antimicrobials:  Anti-infectives (From admission, onward)    Start     Dose/Rate Route Frequency Ordered Stop   06/15/24 1200  fluconazole  (DIFLUCAN ) IVPB 200 mg        200 mg 100 mL/hr over 60 Minutes Intravenous Daily 06/15/24 0957 06/22/24 0959   06/14/24 0400  cefTRIAXone  (ROCEPHIN ) 1 g in sodium chloride  0.9 % 100 mL IVPB        1 g 200 mL/hr over 30 Minutes Intravenous Every 24 hours 06/14/24 0349 06/19/24 0359         Subjective: Patient sleeping but arousable.  States she does not feel any significant improvement with her shortness of breath.  Refused breathing treatment her breathing to get worse with increased panting.  Denies any chest pain.  No abdominal pain.  States did not get placed on BiPAP overnight.     Objective: Vitals:   06/16/24 0800 06/16/24 0817 06/16/24 0852 06/16/24 0900  BP: (!) 207/135   (!) 188/65  Pulse: 76  76 69  Resp: (!) 25  (!) 21 15  Temp:  98 F (36.7 C)    TempSrc:  Oral    SpO2: 92%  98% 100%  Weight:      Height:        Intake/Output Summary (Last 24 hours) at 06/16/2024 0933 Last data filed at 06/16/2024 0800 Gross per 24 hour  Intake 357.49 ml  Output 750 ml  Net -392.51 ml   Filed Weights   06/13/24 2213 06/14/24 1129  Weight: 38.6 kg 32.1 kg    Examination:  General exam: NAD.  Oral thrush. Respiratory system: Bibasilar crackles.  Minimal expiratory wheezing.  Fair air movement.  No use of accessory muscles of respiration.  Speaking in full sentences. Cardiovascular system: RRR no murmurs rubs or gallops.  No JVD.  No lower extremity edema.  Gastrointestinal system: Abdomen is soft, nontender, nondistended, positive bowel sounds.  No rebound.  No  guarding. Central nervous system: Alert and oriented. No focal neurological deficits. Extremities: Symmetric 5 x 5 power. Skin: No rashes, lesions or ulcers Psychiatry: Judgement and insight appear normal. Mood & affect appropriate.     Data Reviewed: I have personally reviewed following labs and imaging studies  CBC: Recent Labs  Lab 06/13/24 2311 06/14/24 0505 06/15/24 0248 06/16/24 0301  WBC 12.0* 10.6* 5.3 8.4  HGB 10.7* 10.3* 10.4* 9.7*  HCT 38.8 36.5 36.3 34.0*  MCV 97.7 96.3 96.5 96.6  PLT 353 310 308 323    Basic Metabolic Panel: Recent Labs  Lab 06/13/24 2311 06/14/24 0505 06/15/24 0248 06/16/24 0301  NA 140 135 139 139  K 4.5 4.3 3.9 3.7  CL 99 97* 98 100  CO2 31 30 29 29   GLUCOSE 141* 178* 180* 129*  BUN 17 14 15 20   CREATININE 0.69 0.53 0.53 0.52  CALCIUM  8.6* 8.5* 9.0 8.5*  MG  --   --  1.8 2.0  GFR: Estimated Creatinine Clearance: 24.2 mL/min (by C-G formula based on SCr of 0.52 mg/dL).  Liver Function Tests: Recent Labs  Lab 06/14/24 0505  AST 16  ALT 10  ALKPHOS 98  BILITOT 0.5  PROT 5.9*  ALBUMIN 2.5*    CBG: No results for input(s): GLUCAP in the last 168 hours.   Recent Results (from the past 240 hours)  MRSA Next Gen by PCR, Nasal     Status: None   Collection Time: 06/14/24 12:09 PM   Specimen: Nasal Mucosa; Nasal Swab  Result Value Ref Range Status   MRSA by PCR Next Gen NOT DETECTED NOT DETECTED Final    Comment: (NOTE) The GeneXpert MRSA Assay (FDA approved for NASAL specimens only), is one component of a comprehensive MRSA colonization surveillance program. It is not intended to diagnose MRSA infection nor to guide or monitor treatment for MRSA infections. Test performance is not FDA approved in patients less than 45 years old. Performed at [facility], [address] [place], [place] [address]          Radiology Studies: ECHOCARDIOGRAM COMPLETE Result Date: 06/14/2024     ECHOCARDIOGRAM REPORT   Patient Name:   Jessica [name] Date of Exam: 06/14/2024 Medical Rec #:  [id number]    Height:       60.0 in Accession #:    [id number]   Weight:       70.8 lb Date of Birth:  Jan 27, 1932     BSA:          1.200 m Patient Age:    89 years     BP:           142/102 mmHg Patient Gender: F            HR:           107 bpm. Exam Location:  Inpatient Procedure: 2D Echo, Color Doppler and Cardiac Doppler (Both Spectral and Color            Flow Doppler were utilized during procedure). Indications:    Dyspnea  History:        Patient has prior history of Echocardiogram examinations, most                 recent 06/28/2015. Risk Factors:Hypertension.  Sonographer:    Benard Stallion Referring Phys: [id number] TIMOTHY S OPYD IMPRESSIONS  1. Left ventricular ejection fraction, by estimation, is 60 to 65%. The left ventricle has normal function. The left ventricle has no regional wall motion abnormalities. Left ventricular diastolic parameters are consistent with Grade I diastolic dysfunction (impaired relaxation). There is the interventricular septum is flattened in systole, consistent with right ventricular pressure overload.  2. Right ventricular systolic function is normal. The right ventricular size is normal. There is moderately elevated pulmonary artery systolic pressure.  3. The mitral valve is normal in structure. Mild mitral valve regurgitation. No evidence of mitral stenosis.  4. The aortic valve is calcified. Aortic valve regurgitation is not visualized. Aortic valve sclerosis/calcification is present, without any evidence of aortic stenosis. FINDINGS  Left Ventricle: Left ventricular ejection fraction, by estimation, is 60 to 65%. The left ventricle has normal function. The left ventricle has no regional wall motion abnormalities. The left ventricular internal cavity size was normal in size. There is  no left ventricular hypertrophy. The interventricular septum is flattened in systole, consistent  with right ventricular pressure overload. Left ventricular diastolic parameters are consistent with Grade I diastolic dysfunction (impaired  relaxation). Right Ventricle: The right ventricular size is normal. No increase in right ventricular wall thickness. Right ventricular systolic function is normal. There is moderately elevated pulmonary artery systolic pressure. The tricuspid regurgitant velocity is 3.43 m/s, and with an assumed right atrial pressure of 8 mmHg, the estimated right ventricular systolic pressure is 55.1 mmHg. Left Atrium: Left atrial size was normal in size. Right Atrium: Right atrial size was normal in size. Pericardium: There is no evidence of pericardial effusion. Mitral Valve: The mitral valve is normal in structure. Mild mitral valve regurgitation. No evidence of mitral valve stenosis. Tricuspid Valve: The tricuspid valve is normal in structure. Tricuspid valve regurgitation is not demonstrated. No evidence of tricuspid stenosis. Aortic Valve: The aortic valve is calcified. Aortic valve regurgitation is not visualized. Aortic valve sclerosis/calcification is present, without any evidence of aortic stenosis. Aortic valve mean gradient measures 5.0 mmHg. Aortic valve peak gradient measures 8.4 mmHg. Aortic valve area, by VTI measures 2.13 cm. Pulmonic Valve: The pulmonic valve was normal in structure. Pulmonic valve regurgitation is not visualized. No evidence of pulmonic stenosis. Aorta: The aortic root is normal in size and structure. IAS/Shunts: No atrial level shunt detected by color flow Doppler.  LEFT VENTRICLE PLAX 2D LVIDd:         2.40 cm   Diastology LVIDs:         1.40 cm   LV e' medial:    2.83 cm/s LV PW:         0.90 cm   LV E/e' medial:  27.2 LV IVS:        0.90 cm   LV e' lateral:   4.03 cm/s LVOT diam:     2.10 cm   LV E/e' lateral: 19.1 LV SV:         66 LV SV Index:   55 LVOT Area:     3.46 cm  RIGHT VENTRICLE RV Basal diam:  3.30 cm RV Mid diam:    2.60 cm LEFT ATRIUM              Index        RIGHT ATRIUM          Index LA diam:        1.90 cm 1.58 cm/m   RA Area:     8.82 cm LA Vol (A2C):   29.3 ml 24.41 ml/m  RA Volume:   18.60 ml 15.50 ml/m LA Vol (A4C):   23.5 ml 19.58 ml/m LA Biplane Vol: 28.2 ml 23.49 ml/m  AORTIC VALVE AV Area (Vmax):    2.34 cm AV Area (Vmean):   2.20 cm AV Area (VTI):     2.13 cm AV Vmax:           145.00 cm/s AV Vmean:          99.100 cm/s AV VTI:            0.310 m AV Peak Grad:      8.4 mmHg AV Mean Grad:      5.0 mmHg LVOT Vmax:         97.90 cm/s LVOT Vmean:        63.000 cm/s LVOT VTI:          0.191 m LVOT/AV VTI ratio: 0.62  AORTA Ao Root diam: 2.60 cm MITRAL VALVE                TRICUSPID VALVE MV Area (PHT): 2.24 cm     TR Peak  grad:   47.1 mmHg MV Decel Time: 338 msec     TR Vmax:        343.00 cm/s MV E velocity: 77.10 cm/s MV A velocity: 125.00 cm/s  SHUNTS MV E/A ratio:  0.62         Systemic VTI:  0.19 m                             Systemic Diam: 2.10 cm Kardie Tobb DO Electronically signed by Kardie Tobb DO Signature Date/Time: 06/14/2024/4:08:56 PM    Final         Scheduled Meds:  amLODipine   5 mg Oral Daily   arformoterol   15 mcg Nebulization BID   aspirin  EC  81 mg Oral Daily   atorvastatin   20 mg Oral Daily   bisacodyl   5 mg Oral Daily   budesonide  (PULMICORT ) nebulizer solution  0.5 mg Nebulization BID   Chlorhexidine  Gluconate Cloth  6 each Topical Daily   clopidogrel   75 mg Oral Daily   enoxaparin  (LOVENOX ) injection  30 mg Subcutaneous Q24H   feeding supplement  1 Container Oral BID BM   fluticasone   2 spray Each Nare Daily   gabapentin   100 mg Oral QHS   guaiFENesin   1,200 mg Oral BID   ipratropium  0.5 mg Nebulization Q8H   levalbuterol   0.63 mg Nebulization Q8H   loratadine   10 mg Oral Daily   losartan   100 mg Oral QPM   mirtazapine   30 mg Oral QHS   montelukast   10 mg Oral QHS   nystatin   5 mL Oral QID   mouth rinse  15 mL Mouth Rinse 4 times per day   pantoprazole   40 mg Oral Daily    polyethylene glycol  17 g Oral Daily   predniSONE   40 mg Oral Q breakfast   propranolol  ER  120 mg Oral Daily   sodium chloride  flush  3 mL Intravenous Q12H   Continuous Infusions:  cefTRIAXone  (ROCEPHIN )  IV Stopped (06/16/24 0346)   fluconazole  (DIFLUCAN ) IV Stopped (06/15/24 1221)     LOS: 2 days    Time spent: 45 minutes    Toribio Hummer, MD [facility]   To contact the attending provider between 7A-7P or the covering provider during after hours 7P-7A, please log into the web site [email or web address] and access using universal [place] password for that web site. If you do not have the password, please call the hospital operator.  06/16/2024, 9:33 AM
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
## 2024-06-16 NOTE — Plan of Care (Signed)

## 2024-06-16 NOTE — Progress Notes (Signed)
 SLP Cancellation Note  Patient Details Name: Jessica [name] MRN: [id number] DOB: [birth year]   Cancelled treatment:       Reason Eval/Treat Not Completed: Other (comment) (SlP got pt repositioned upright with RN for her meal, then her lung MD arrived, will continue efforts.)  Jessica POUR, MS [facility] SLP Acute Rehab Services Office [phone]  Jessica [name] 06/16/2024, 12:36 PM

## 2024-06-16 NOTE — Progress Notes (Signed)
 PT Cancellation Note  Patient Details Name: Jessica [name] MRN: [id number] DOB: 10/02/1932   Cancelled Treatment:    Reason Eval/Treat Not Completed: Patient declined, reporting needing to get her breathing in order prior to exerting herself. Pt O2 saturation 96% at rest with supplemental oxygen . Pt daughter present. PT to continue to follow acutely.   [place], PT Acute Rehab   [place] VEAR Drone 06/16/2024, 1:36 PM
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
## 2024-06-17 DIAGNOSIS — Z66 Do not resuscitate: Secondary | ICD-10-CM | POA: Diagnosis not present

## 2024-06-17 DIAGNOSIS — Z515 Encounter for palliative care: Secondary | ICD-10-CM | POA: Diagnosis not present

## 2024-06-17 DIAGNOSIS — J9620 Acute and chronic respiratory failure, unspecified whether with hypoxia or hypercapnia: Secondary | ICD-10-CM

## 2024-06-17 DIAGNOSIS — Z711 Person with feared health complaint in whom no diagnosis is made: Secondary | ICD-10-CM | POA: Diagnosis not present

## 2024-06-17 DIAGNOSIS — K5900 Constipation, unspecified: Secondary | ICD-10-CM | POA: Diagnosis not present

## 2024-06-17 DIAGNOSIS — Z79899 Other long term (current) drug therapy: Secondary | ICD-10-CM

## 2024-06-17 DIAGNOSIS — I5030 Unspecified diastolic (congestive) heart failure: Secondary | ICD-10-CM

## 2024-06-17 DIAGNOSIS — R0602 Shortness of breath: Principal | ICD-10-CM

## 2024-06-17 DIAGNOSIS — I10 Essential (primary) hypertension: Secondary | ICD-10-CM | POA: Diagnosis not present

## 2024-06-17 DIAGNOSIS — J441 Chronic obstructive pulmonary disease with (acute) exacerbation: Secondary | ICD-10-CM | POA: Diagnosis not present

## 2024-06-17 DIAGNOSIS — R918 Other nonspecific abnormal finding of lung field: Secondary | ICD-10-CM | POA: Diagnosis not present

## 2024-06-17 DIAGNOSIS — Z7189 Other specified counseling: Secondary | ICD-10-CM | POA: Diagnosis not present

## 2024-06-17 DIAGNOSIS — R600 Localized edema: Secondary | ICD-10-CM | POA: Diagnosis not present

## 2024-06-17 LAB — BLOOD GAS, ARTERIAL
Bicarbonate: 36.1 mmol/L — ABNORMAL HIGH (ref 20.0–28.0)
Inspiratory PAP: 8 cmH2O
Mode: 8 cmH2O
O2 Content: 35 L/min
O2 Saturation: 98.3 mmol/L — AB (ref 0.0–2.0)
Patient temperature: 36.4
Patient temperature: 98.3
pH, Arterial: 56 mmHg — AB (ref 7.35–7.45)
pH, Arterial: 7.42 cmH2O (ref 7.35–7.45)
pO2, Arterial: 80 mmHg — ABNORMAL LOW (ref 83–48)
pO2, Arterial: 80 mmol/L — ABNORMAL LOW (ref 83–28.0)

## 2024-06-17 LAB — CBC
HCT: 35.8 % — ABNORMAL LOW (ref 36.0–46.0)
Hemoglobin: 10.1 g/dL — ABNORMAL LOW (ref 12.0–15.0)
MCH: 27.2 pg (ref 26.0–34.0)
MCHC: 28.2 g/dL — ABNORMAL LOW (ref 30.0–36.0)
MCV: 96.2 fL (ref 80.0–100.0)
Platelets: 302 K/uL (ref 150–400)
RBC: 3.72 MIL/uL — ABNORMAL LOW (ref 3.87–5.11)
RDW: 13.7 % (ref 11.5–15.5)
WBC: 11.6 K/uL — ABNORMAL HIGH (ref 4.0–10.5)
nRBC: 0 % (ref 0.0–0.2)

## 2024-06-17 LAB — BASIC METABOLIC PANEL WITH GFR
Anion gap: 10 (ref 5–15)
BUN: 19 mg/dL (ref 8–23)
CO2: 32 mmol/L (ref 22–32)
Calcium: 8.5 mg/dL — ABNORMAL LOW (ref 8.9–10.3)
Chloride: 96 mmol/L — ABNORMAL LOW (ref 98–111)
Creatinine, Ser: 0.47 mg/dL (ref 0.44–1.00)
GFR, Estimated: >60 mL/min (ref >60)
Glucose, Bld: 179 mg/dL — ABNORMAL HIGH (ref 70–99)
Potassium: 3.1 mmol/L — ABNORMAL LOW (ref 3.5–5.1)
Sodium: 138 mmol/L (ref 135–145)

## 2024-06-17 LAB — BRAIN NATRIURETIC PEPTIDE: B Natriuretic Peptide: 787.4 pg/mL — ABNORMAL HIGH (ref 0.0–100.0)

## 2024-06-17 MED ORDER — BISACODYL 10 MG RE SUPP
10.0000 mg | Freq: Every day | RECTAL | Status: DC
  Administered 2024-06-17: 10 mg via RECTAL

## 2024-06-17 MED ORDER — ALPRAZOLAM 0.5 MG PO TABS: 0.5000 mg | ORAL_TABLET | Freq: Four times a day (QID) | ORAL | Status: DC | PRN

## 2024-06-17 MED ORDER — BIOTENE DRY MOUTH MT LIQD: 15.0000 mL | OROMUCOSAL | Status: DC | PRN

## 2024-06-17 MED ORDER — GLYCOPYRROLATE 0.2 MG/ML IJ SOLN: 0.2000 mg | INTRAMUSCULAR | Status: DC | PRN

## 2024-06-17 MED ORDER — MORPHINE BOLUS VIA INFUSION
1.0000 mg | INTRAVENOUS | Status: DC | PRN
  Administered 2024-06-18: 3 mg via INTRAVENOUS
  Administered 2024-06-18: 4 mg via INTRAVENOUS
  Administered 2024-06-18: 2 mg via INTRAVENOUS

## 2024-06-17 MED ORDER — FUROSEMIDE 20 MG PO TABS: 20.0000 mg | ORAL_TABLET | Freq: Every day | ORAL | Status: DC

## 2024-06-17 MED ORDER — POLYVINYL ALCOHOL 1.4 % OP SOLN: 1.0000 [drp] | Freq: Four times a day (QID) | OPHTHALMIC | Status: DC | PRN

## 2024-06-17 MED ORDER — BISACODYL 10 MG RE SUPP: 10.0000 mg | Freq: Every day | RECTAL | Status: DC | PRN

## 2024-06-17 MED ORDER — POTASSIUM CHLORIDE CRYS ER 20 MEQ PO TBCR
40.0000 meq | EXTENDED_RELEASE_TABLET | ORAL | Status: DC
  Filled 2024-06-17: qty 2

## 2024-06-17 MED ORDER — SODIUM CHLORIDE 0.9 % IV SOLN
500.0000 mg | INTRAVENOUS | Status: DC
  Filled 2024-06-17: qty 5

## 2024-06-17 MED ORDER — MORPHINE 100MG IN NS 100ML (1MG/ML) PREMIX INFUSION
1.0000 mg/h | INTRAVENOUS | Status: DC
  Administered 2024-06-17: 1 mg/h via INTRAVENOUS
  Administered 2024-06-18: 5 mg/h via INTRAVENOUS
  Filled 2024-06-17 (×2): qty 100

## 2024-06-17 MED ORDER — HALOPERIDOL LACTATE 5 MG/ML IJ SOLN
2.0000 mg | INTRAMUSCULAR | Status: DC | PRN
  Administered 2024-06-18: 2 mg via INTRAVENOUS
  Filled 2024-06-17: qty 1

## 2024-06-17 MED ORDER — MORPHINE SULFATE (PF) 2 MG/ML IV SOLN
1.0000 mg | INTRAVENOUS | Status: DC | PRN
  Administered 2024-06-17: 2 mg via INTRAVENOUS
  Administered 2024-06-17: 1 mg via INTRAVENOUS
  Filled 2024-06-17 (×2): qty 1

## 2024-06-17 MED ORDER — LEVALBUTEROL HCL 0.63 MG/3ML IN NEBU
0.6300 mg | INHALATION_SOLUTION | Freq: Two times a day (BID) | RESPIRATORY_TRACT | Status: DC
  Filled 2024-06-17: qty 3

## 2024-06-17 MED ORDER — AMLODIPINE BESYLATE 5 MG PO TABS
7.5000 mg | ORAL_TABLET | Freq: Every day | ORAL | Status: DC
  Filled 2024-06-17: qty 2

## 2024-06-17 NOTE — Progress Notes (Signed)
 PT Cancellation Note  Patient Details Name: Jessica [name] MRN: [id number] DOB: Apr 28, 1932   Cancelled Treatment:     PT order received but eval deferred.  Family has elected to focus on comfort care.  PT will sign off at this time.  Please re-order if situation changes.   Kadey Mihalic 06/17/2024, 1:06 PM

## 2024-06-17 NOTE — Progress Notes (Addendum)
 PROGRESS NOTE    Jessica [name]  FMW:[id number] DOB: 05/29/1932 DOA: 06/13/2024 PCP: Nichole Senior, MD   Chief Complaint  Patient presents with   Cough   Shortness of Breath    Brief Narrative:  Patient is a 89 year old female history of COPD, chronic respiratory failure, hypertension, PAD status post angioplasty June 2025 presenting with worsening shortness and productive cough x 3 days.  Patient in the ED noted to be on 3 L nasal cannula, BP stable, noted to have a leukocytosis, D-dimer elevated at 1.15, BNP of 970.  CT angiogram chest done was negative for PE however noticeable for advanced emphysema and multiple lung nodules.  Patient admitted with acute COPD exacerbation.  Patient noted once got to the floor to have increased work of breathing, use of accessory muscles of respiration and subsequently transferred to the stepdown unit placed on BiPAP ABG ordered and patient placed on Pulmicort  and Brovana  nebs in addition to scheduled DuoNebs.   Assessment & Plan:   Principal Problem:   COPD with acute exacerbation (HCC) Active Problems:   Acute respiratory distress   Chronic respiratory failure with hypoxia (HCC)   Essential hypertension   PVD (peripheral vascular disease) (HCC)   Bilateral leg edema   Lung nodules   Oral thrush   Constipation   Pressure injury of skin   #1 acute respiratory distress on chronic respiratory failure secondary to probable acute COPD exacerbation +/- acute CHF exacerbation -Patient presenting with worsening shortness of breath, productive cough of greenish sputum. - CT angiogram chest done negative for PE however consistent with advanced emphysema with multiple lung nodules. -On admission VBG obtained with a pH of 7.34, pCO2 of 67, PaO2 < 31, bicarb of 36. - Patient with no significant lower extremity edema noted. - Patient noted with increased work of breathing on assessment the morning of 06/14/2024.   - Patient transferred to stepdown unit,  placed on BiPAP and also received Lasix  20 mg IV x 1.   - Patient with some clinical improvement, improved work of breathing. -Patient however feels breathing gets worse with panting after breathing treatments and is insistent that she does not feel significant improvement since admission. --2D echo with EF of 60 to 65%, NWMA, grade 1 DD, intraventricular septum is flattened in systole consistent with right ventricular pressure overload.  Mild MVR.  - Continue Yulperi, Brovana , Mucinex , Flonase , Claritin , PPI, IV Rocephin , Xopenex  nebs. -Change Xopenex  nebs to twice daily. -Continue steroid taper. -Patient started on Lasix  40 mg IV daily with urine output of 1.750 L over the past 24 hours. -Discontinue Lasix  after today's doses. -Strict I's and O's, daily weights. - Supportive care. -BiPAP nightly, BiPAP as needed. 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  PCCM with kyphoscoliosis likely causing some restriction.  - PCCM recommending continuation of bronchodilators, empiric antibiotics, pain medication and also recommending involvement of palliative care medicine as patient with very advanced underlying lung disease.  - Will consult with palliative care medicine team for goals of care.   2.  Leg swelling -Patient reported to admitting physician had bilateral lower extremity edema. - BNP noted at 970, no cardiomegaly, vascular congestion or edema noted on CT chest. - 2D echo with EF of 60 to 65%, NWMA, grade 1 DD, intraventricular septum is flattened in systole consistent with right ventricular pressure overload.  Mild MVR.  - No significant leg swelling noted on exam.   - Norvasc   resumed.  3.  PAD -Status post angioplasty on left popliteal artery in June 2025. - No acute ischemia noted. - Continue aspirin , Plavix , Lipitor.   4.  Hypertension - Continue propranolol , losartan . - Increase Norvasc  to 7.5 mg daily. -  Patient on Lasix  40 mg IV daily.  5.  Lung nodules -Noted on CT chest. - outpatient follow-up recommended with repeat scan in 3 to 6 months.  6.  Oral thrush - Continue nystatin  swish and swallow, Diflucan .   7.  Constipation - Continue bowel regimen of MiraLAX  daily, Dulcolax daily.   8.  Dysphagia - SLP following  9.  Hypokalemia -Likely secondary to diuresis.   -Replete.  10.  Anxiety -Continue Xanax  3 times daily as needed.    DVT prophylaxis: Lovenox  Code Status: Full Family Communication: Updated patient, no family at bedside. Disposition: Remain in stepdown unit.  Status is: Inpatient The patient will require care spanning > 2 midnights and should be moved to inpatient because: Severity of illness   Consultants:  PCCM: Dr.Olalere 06/16/2024  Procedures:  CT angiogram chest 06/14/2024 Chest x-ray 06/13/2024 2D echo 06/14/2024  Antimicrobials:  Anti-infectives (From admission, onward)    Start     Dose/Rate Route Frequency Ordered Stop   06/15/24 1200  fluconazole  (DIFLUCAN ) IVPB 200 mg        200 mg 100 mL/hr over 60 Minutes Intravenous Daily 06/15/24 0957 06/22/24 0959   06/14/24 0400  cefTRIAXone  (ROCEPHIN ) 1 g in sodium chloride  0.9 % 100 mL IVPB        1 g 200 mL/hr over 30 Minutes Intravenous Every 24 hours 06/14/24 0349 06/19/24 0359         Subjective: Patient resting comfortably on BiPAP.   Objective: Vitals:   06/17/24 0610 06/17/24 0729 06/17/24 0733 06/17/24 0817  BP: (!) 185/46     Pulse:   60   Resp:   19   Temp:    97.9 F (36.6 C)  TempSrc:    Axillary  SpO2:  100% 100%   Weight:      Height:        Intake/Output Summary (Last 24 hours) at 06/17/2024 0908 Last data filed at 06/17/2024 0530 Gross per 24 hour  Intake 338.7 ml  Output 1750 ml  Net -1411.3 ml   Filed Weights   06/13/24 2213 06/14/24 1129 06/17/24 0500  Weight: 38.6 kg 32.1 kg 33.4 kg    Examination:  General exam: NAD.  Oral thrush.  Frail.   Cachectic. Respiratory system: CTAB anterior lung fields.  Minimal expiratory wheezing.  No use of accessory muscles of respiration.  On BiPAP.   Cardiovascular system: Regular rate rhythm no murmurs rubs or gallops.  No JVD.  No pitting lower extremity edema.  Gastrointestinal system: Abdomen soft, nontender, nondistended, positive bowel sounds.  No rebound.  No guarding.  Central nervous system: Alert and oriented. No focal neurological deficits. Extremities: Symmetric 5 x 5 power. Skin: No rashes, lesions or ulcers Psychiatry: Judgement and insight appear normal. Mood & affect appropriate.     Data Reviewed: I have personally reviewed following labs and imaging studies  CBC: Recent Labs  Lab 06/13/24 2311 06/14/24 0505 06/15/24 0248 06/16/24 0301 06/17/24 0248  WBC 12.0* 10.6* 5.3 8.4 11.6*  HGB 10.7* 10.3* 10.4* 9.7* 10.1*  HCT 38.8 36.5 36.3 34.0* 35.8*  MCV 97.7 96.3 96.5 96.6 96.2  PLT 353 310 308 323 302    Basic Metabolic Panel: Recent Labs  Lab 06/13/24 2311 06/14/24  0505 06/15/24 0248 06/16/24 0301 06/17/24 0248  NA 140 135 139 139 138  K 4.5 4.3 3.9 3.7 3.1*  CL 99 97* 98 100 96*  CO2 31 30 29 29  32  GLUCOSE 141* 178* 180* 129* 179*  BUN 17 14 15 20 19   CREATININE 0.69 0.53 0.53 0.52 0.47  CALCIUM  8.6* 8.5* 9.0 8.5* 8.5*  MG  --   --  1.8 2.0  --     GFR: Estimated Creatinine Clearance: 25.1 mL/min (by C-G formula based on SCr of 0.47 mg/dL).  Liver Function Tests: Recent Labs  Lab 06/14/24 0505  AST 16  ALT 10  ALKPHOS 98  BILITOT 0.5  PROT 5.9*  ALBUMIN 2.5*    CBG: No results for input(s): GLUCAP in the last 168 hours.   Recent Results (from the past 240 hours)  MRSA Next Gen by PCR, Nasal     Status: None   Collection Time: 06/14/24 12:09 PM   Specimen: Nasal Mucosa; Nasal Swab  Result Value Ref Range Status   MRSA by PCR Next Gen NOT DETECTED NOT DETECTED Final    Comment: (NOTE) The GeneXpert MRSA Assay (FDA approved for  NASAL specimens only), is one component of a comprehensive MRSA colonization surveillance program. It is not intended to diagnose MRSA infection nor to guide or monitor treatment for MRSA infections. Test performance is not FDA approved in patients less than 42 years old. Performed at [facility], [address], [place], [place] [address]          Radiology Studies: DG CHEST PORT 1 VIEW Result Date: 06/16/2024 CLINICAL DATA:  Shortness of breath EXAM: PORTABLE CHEST 1 VIEW COMPARISON:  X-ray 06/13/2024 and older.  CTA chest 06/14/2024 FINDINGS: Hyperinflation. Normal cardiopericardial silhouette. Chronic lung changes. There is blunting of the costophrenic angles bilaterally. Question tiny effusions. Mild left retrocardiac opacity but decreased from previous. IMPRESSION: Decreasing left retrocardiac opacity. Recommend continued follow-up. Question tiny pleural effusions. Hyperinflation with chronic lung changes. Electronically Signed   By: Ranell Bring M.D.   On: 06/16/2024 11:18        Scheduled Meds:  amLODipine   7.5 mg Oral Daily   arformoterol   15 mcg Nebulization BID   aspirin  EC  81 mg Oral Daily   atorvastatin   20 mg Oral Daily   bisacodyl   5 mg Oral Daily   budesonide  (PULMICORT ) nebulizer solution  0.5 mg Nebulization BID   Chlorhexidine  Gluconate Cloth  6 each Topical Daily   clopidogrel   75 mg Oral Daily   feeding supplement  1 Container Oral BID BM   fluticasone   2 spray Each Nare Daily   furosemide   40 mg Intravenous Daily   gabapentin   100 mg Oral QHS   guaiFENesin   1,200 mg Oral BID   heparin  injection (subcutaneous)  5,000 Units Subcutaneous Q12H   levalbuterol   0.63 mg Nebulization Q8H   loratadine   10 mg Oral Daily   losartan   100 mg Oral QPM   mirtazapine   30 mg Oral QHS   montelukast   10 mg Oral QHS   nystatin   5 mL Oral QID   mouth rinse  15 mL Mouth Rinse 4 times per day   pantoprazole   40 mg Oral Daily   polyethylene glycol  17  g Oral Daily   potassium chloride   40 mEq Oral Q4H   predniSONE   40 mg Oral Q breakfast   propranolol  ER  120 mg Oral Daily   revefenacin   175 mcg  Nebulization Daily   sodium chloride  flush  3 mL Intravenous Q12H   Continuous Infusions:  cefTRIAXone  (ROCEPHIN )  IV Stopped (06/17/24 0453)   fluconazole  (DIFLUCAN ) IV Stopped (06/16/24 1103)     LOS: 3 days    Time spent: 40 minutes    Toribio Hummer, MD [facility]   To contact the attending provider between 7A-7P or the covering provider during after hours 7P-7A, please log into the web site [email or web address] and access using universal [place] password for that web site. If you do not have the password, please call the hospital operator.  06/17/2024, 9:08 AM

## 2024-06-17 NOTE — Progress Notes (Signed)
 OT Cancellation Note  Patient Details Name: Jessica [name] MRN: [id number] DOB: 05-29-1932   Cancelled Treatment:    Reason Eval/Treat Not Completed: Other (comment) (Pt and family elected for comfort care.) Should situation change please feel free to re-consult.  Leita PARAS Demarri Elie 06/17/2024, 12:06 PM  Leita DEL OTR/L Acute Rehabilitation Services Office: [phone]

## 2024-06-17 NOTE — Progress Notes (Signed)
 Chaplain visited pt in response to [facility] consult to provide support due to of end of life. Pt's only child, [name] was at bedside. Pt is of the [place] faith and prayer is meaningful to her. Chaplain provided reflective listening, a compassionate presence and prayer at pt's daughter's request. Additional chaplain support would be welcomed.  [address], [place]    06/17/24 1300  Spiritual Encounters  Type of Visit Initial  Care provided to: Patient;Family  Referral source Physician  Reason for visit End-of-life  Spiritual Framework  Presenting Themes Significant life change;Impactful experiences and emotions  Community/Connection Family  Patient Stress Factors Health changes  Interventions  Spiritual Care Interventions Made Established relationship of care and support;Compassionate presence;Reflective listening;Prayer  Intervention Outcomes  Outcomes Connection to spiritual care;Reduced anxiety  Spiritual Care Plan  Spiritual Care Issues Still Outstanding Referring to oncoming chaplain for further support
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
## 2024-06-17 NOTE — Progress Notes (Signed)
 NAME:  Jessica [name], MRN:  [id number], DOB:  03-19-1932, LOS: 3 ADMISSION DATE:  06/13/2024, CONSULTATION DATE:  06/16/24 REFERRING MD:  Dr Sebastian, CHIEF COMPLAINT:  Acute-on-chronic hypoxic respiratory failure    History of Present Illness:  89 year old woman who presented to [facility] 7/15 via EMS for cough and SOB x 3 days. PMHx significant for HTN, HLD, COPD with emphysema and chronic hypoxic respiratory failure (on 3L HOT), bilateral renal artery stenosis, GERD, anemia, arthritis.    Patient presented to [facility] ED via EMS; at the time of presentation she reported productive cough and SOB for the three days PTA but denied any fevers/chills. Denies CP/significant LE edema. Endorses compliance with her home oxygen  therapy of 3LNC and with her Trelegy inhaler. Reports feeling worse after nebulization treatments and like she is panting. Patient does endorse some anxiety r/t increased WOB/SOB and states that anxiolytics have helped her.   On presentation to ED, patient was afebrile with HR 64, BP 131/60, RR 15, SpO2 99% on 3LNC. Labs were notable for WBC 12, Hgb 10.7, Plt 353; Na 140, K 4.5, CO2 31, BUN/Cr 17/0.69, Trop 13, BNP 969.8. D-dimer 1.15. VBG pH 7.34/pCO2 67/bicarb 36.1. CXR demonstrated hyperinflated lungs c/w COPD, bibasilar atelectasis. CTA Chest PE Protocol negative for PE; +advanced emphysema with bronchial inflammation and multiple subpleural nodules (LUL, RLL).    PCCM consulted for further recommendations on management of persistent SOB/increased WOB.  Pertinent  Medical History   Past Medical History:  Diagnosis Date   Anemia    Arthritis    thumbs (06/27/2015)   Bilateral renal artery stenosis (HCC)    Cataract    Chronic bronchitis (HCC)    Chronic lower back pain    COPD (chronic obstructive pulmonary disease) (HCC)    GERD (gastroesophageal reflux disease)    Headache    probably weekly (06/27/2015)   Hypertension    Hypertriglyceridemia    Migraine    maybe monthly  (06/27/2015)   Pneumonia    Significant Hospital Events: Including procedures, antibiotic start and stop dates in addition to other pertinent events   7/15 - Presented to [facility] ED with SOB x 3 days, +productive cough. CXR with hyperinflation, bibasilar atelectasis. CTA Chest PE Protocol negative for PE, +advanced emphysema with bronchial inflammation and multiple subpleural nodules (LUL, RLL). 7/18 - PCCM consulted for persistent SOB/WOB.  Interim History / Subjective:  No overnight events  Objective    Blood pressure (!) 166/50, pulse 67, temperature 97.9 F (36.6 C), temperature source Axillary, resp. rate 20, height 5' (1.524 m), weight 33.4 kg, last menstrual period 11/30/1976, SpO2 100%.    FiO2 (%):  [32 %-35 %] 35 % PEEP:  [5 cmH20] 5 cmH20   Intake/Output Summary (Last 24 hours) at 06/17/2024 9077 Last data filed at 06/17/2024 0530 Gross per 24 hour  Intake 338.7 ml  Output 1750 ml  Net -1411.3 ml   Filed Weights   06/13/24 2213 06/14/24 1129 06/17/24 0500  Weight: 38.6 kg 32.1 kg 33.4 kg    Examination: General: Elderly, frail HENT: Moist oral mucosa Lungs: Decreased air movement bilaterally Cardiovascular: S1-S2 appreciated Abdomen: Soft, bowel sounds appreciated Extremities: No clubbing, no edema Neuro: Alert and oriented x 3 GU:   Resolved problem list   Assessment and Plan   Chronic obstructive pulmonary disease Increased cough with shortness of breath and mucus production - COPD exacerbation - Bronchodilators - Steroids-prednisone  - Antibiotic therapy-cover for community-acquired pneumonia-is on ceftriaxone , will add 3-day course of azithromycin  -  Stop ceftriaxone  at day 7  Advanced COPD Exacerbation of chronic respiratory failure - Continue oxygen  supplementation  Hypertension - Continue current meds  Pulmonary hypertension - Secondary to advanced lung disease  Diastolic heart failure - Continue diuretics  BiPAP may help work of  breathing  Notably diffusing capacity was 27 percent from PFT 3 years ago, so likely lower at present, consistent with advanced disease She is very frail, may not be able to rally from this acute on chronic illness  Jennet Epley, MD [place] PCCM Pager: See Tracey

## 2024-06-18 DIAGNOSIS — B370 Candidal stomatitis: Secondary | ICD-10-CM | POA: Diagnosis not present

## 2024-06-18 DIAGNOSIS — R600 Localized edema: Secondary | ICD-10-CM | POA: Diagnosis not present

## 2024-06-18 DIAGNOSIS — I5030 Unspecified diastolic (congestive) heart failure: Secondary | ICD-10-CM | POA: Diagnosis not present

## 2024-06-18 DIAGNOSIS — R918 Other nonspecific abnormal finding of lung field: Secondary | ICD-10-CM | POA: Diagnosis not present

## 2024-06-18 DIAGNOSIS — Z515 Encounter for palliative care: Secondary | ICD-10-CM

## 2024-06-18 DIAGNOSIS — I739 Peripheral vascular disease, unspecified: Secondary | ICD-10-CM | POA: Diagnosis not present

## 2024-06-18 DIAGNOSIS — J9620 Acute and chronic respiratory failure, unspecified whether with hypoxia or hypercapnia: Secondary | ICD-10-CM | POA: Diagnosis not present

## 2024-06-18 DIAGNOSIS — I10 Essential (primary) hypertension: Secondary | ICD-10-CM | POA: Diagnosis not present

## 2024-06-18 DIAGNOSIS — J441 Chronic obstructive pulmonary disease with (acute) exacerbation: Secondary | ICD-10-CM

## 2024-06-18 DIAGNOSIS — R451 Restlessness and agitation: Secondary | ICD-10-CM

## 2024-06-18 DIAGNOSIS — R0603 Acute respiratory distress: Secondary | ICD-10-CM | POA: Diagnosis not present

## 2024-06-18 DIAGNOSIS — K5900 Constipation, unspecified: Secondary | ICD-10-CM | POA: Diagnosis not present

## 2024-06-18 DIAGNOSIS — Z79899 Other long term (current) drug therapy: Secondary | ICD-10-CM | POA: Diagnosis not present

## 2024-06-18 DIAGNOSIS — J449 Chronic obstructive pulmonary disease, unspecified: Secondary | ICD-10-CM

## 2024-06-18 MED ORDER — MIDAZOLAM-SODIUM CHLORIDE 100-0.9 MG/100ML-% IV SOLN
0.5000 mg/h | INTRAVENOUS | Status: DC
  Administered 2024-06-18: 0.5 mg/h via INTRAVENOUS
  Filled 2024-06-18: qty 100

## 2024-06-18 MED ORDER — HALOPERIDOL LACTATE 5 MG/ML IJ SOLN: 5.0000 mg | INTRAMUSCULAR | Status: DC | PRN

## 2024-06-18 MED ORDER — MIDAZOLAM BOLUS VIA INFUSION
1.0000 mg | INTRAVENOUS | Status: DC | PRN
  Administered 2024-06-18: 1 mg via INTRAVENOUS

## 2024-06-30 NOTE — Progress Notes (Signed)
   NAME:  Jessica [name], MRN:  [id number], DOB:  1932/02/10, LOS: 4 ADMISSION DATE:  06/13/2024, CONSULTATION DATE:  06/16/24 REFERRING MD:  Dr Sebastian, CHIEF COMPLAINT:  Acute-on-chronic hypoxic respiratory failure    History of Present Illness:  89 year old woman who presented to [facility] 7/15 via EMS for cough and SOB x 3 days. PMHx significant for HTN, HLD, COPD with emphysema and chronic hypoxic respiratory failure (on 3L HOT), bilateral renal artery stenosis, GERD, anemia, arthritis.    Patient presented to [facility] ED via EMS; at the time of presentation she reported productive cough and SOB for the three days PTA but denied any fevers/chills. Denies CP/significant LE edema. Endorses compliance with her home oxygen  therapy of 3LNC and with her Trelegy inhaler. Reports feeling worse after nebulization treatments and like she is panting. Patient does endorse some anxiety r/t increased WOB/SOB and states that anxiolytics have helped her.   On presentation to ED, patient was afebrile with HR 64, BP 131/60, RR 15, SpO2 99% on 3LNC. Labs were notable for WBC 12, Hgb 10.7, Plt 353; Na 140, K 4.5, CO2 31, BUN/Cr 17/0.69, Trop 13, BNP 969.8. D-dimer 1.15. VBG pH 7.34/pCO2 67/bicarb 36.1. CXR demonstrated hyperinflated lungs c/w COPD, bibasilar atelectasis. CTA Chest PE Protocol negative for PE; +advanced emphysema with bronchial inflammation and multiple subpleural nodules (LUL, RLL).    PCCM consulted for further recommendations on management of persistent SOB/increased WOB.  Pertinent  Medical History   Past Medical History:  Diagnosis Date   Anemia    Arthritis    thumbs (06/27/2015)   Bilateral renal artery stenosis (HCC)    Cataract    Chronic bronchitis (HCC)    Chronic lower back pain    COPD (chronic obstructive pulmonary disease) (HCC)    GERD (gastroesophageal reflux disease)    Headache    probably weekly (06/27/2015)   Hypertension    Hypertriglyceridemia    Migraine    maybe monthly  (06/27/2015)   Pneumonia    Significant Hospital Events: Including procedures, antibiotic start and stop dates in addition to other pertinent events   7/15 - Presented to [facility] ED with SOB x 3 days, +productive cough. CXR with hyperinflation, bibasilar atelectasis. CTA Chest PE Protocol negative for PE, +advanced emphysema with bronchial inflammation and multiple subpleural nodules (LUL, RLL). 7/18 - PCCM consulted for persistent SOB/WOB. 7/19-transition to comfort measures  Interim History / Subjective:  No overnight events Is comfortable On morphine  drip  Objective    Blood pressure (!) 126/52, pulse 76, temperature (!) 94.3 F (34.6 C), temperature source Axillary, resp. rate 15, height 5' (1.524 m), weight 33.4 kg, last menstrual period 11/30/1976, SpO2 (!) 88%.        Intake/Output Summary (Last 24 hours) at 13-Jul-2024 9167 Last data filed at 06/17/2024 2059 Gross per 24 hour  Intake 13.49 ml  Output --  Net 13.49 ml   Filed Weights   06/13/24 2213 06/14/24 1129 06/17/24 0500  Weight: 38.6 kg 32.1 kg 33.4 kg    Examination: Elderly frail Appears comfortable  Resolved problem list   Assessment and Plan   Chronic obstructive pulmonary disease Advanced chronic obstructive pulmonary disease Acute on chronic respiratory failure Hypertension Pulmonary hypertension Diastolic heart failure   Transition to comfort measures On morphine  drip  Focus remains patient's comfort

## 2024-06-30 NOTE — Progress Notes (Signed)
 PROGRESS NOTE    Jessica [name]  FMW:[id number] DOB: 01-22-1932 DOA: 06/13/2024 PCP: Nichole Senior, MD   Chief Complaint  Patient presents with   Cough   Shortness of Breath    Brief Narrative:  Patient is a 89 year old female history of COPD, chronic respiratory failure, hypertension, PAD status post angioplasty June 2025 presenting with worsening shortness and productive cough x 3 days.  Patient in the ED noted to be on 3 L nasal cannula, BP stable, noted to have a leukocytosis, D-dimer elevated at 1.15, BNP of 970.  CT angiogram chest done was negative for PE however noticeable for advanced emphysema and multiple lung nodules.  Patient admitted with acute COPD exacerbation.  Patient noted once got to the floor to have increased work of breathing, use of accessory muscles of respiration and subsequently transferred to the stepdown unit placed on BiPAP ABG ordered and patient placed on Pulmicort  and Brovana  nebs in addition to scheduled DuoNebs. Patient seen in consultation by PCCM. Palliative care consulted and decision made to transition to full comfort measures.   Assessment & Plan:   Principal Problem:   COPD with acute exacerbation (HCC) Active Problems:   Acute respiratory distress   Chronic respiratory failure with hypoxia (HCC)   Essential hypertension   PVD (peripheral vascular disease) (HCC)   Bilateral leg edema   Lung nodules   Oral thrush   Constipation   Pressure injury of skin   Palliative care encounter   DNR (do not resuscitate)   Concern about end of life   Shortness of breath   Medication management   High risk medication use   ACP (advance care planning)   #1 acute respiratory distress on chronic respiratory failure secondary to end-stage COPD exacerbation/acute COPD exacerbation +/- acute CHF exacerbation -Patient presented with worsening shortness of breath, productive cough of greenish sputum. - CT angiogram chest done negative for PE however  consistent with advanced emphysema with multiple lung nodules. -On admission VBG obtained with a pH of 7.34, pCO2 of 67, PaO2 < 31, bicarb of 36. - Patient with no significant lower extremity edema noted. - Patient noted with increased work of breathing on assessment the morning of 06/14/2024.   - Patient transferred to stepdown unit, placed on BiPAP and also received Lasix  20 mg IV x 1.   - Patient with some clinical improvement, improved work of breathing. -Patient however felt breathing gets worse with panting after breathing treatments and is insistent that she does not feel significant improvement since admission. --2D echo with EF of 60 to 65%, NWMA, grade 1 DD, intraventricular septum is flattened in systole consistent with right ventricular pressure overload.  Mild MVR.  - Was initially on Yulperi, Brovana , Mucinex , Flonase , Claritin , PPI, IV Rocephin , Xopenex  nebs, steroid taper. -Patient noted to have received IV Lasix  during the hospitalization with good urine output.  -Also noted to have been placed on BiPAP nightly and as needed. 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  PCCM with kyphoscoliosis likely causing some restriction.  - PCCM recommended continuation of bronchodilators, empiric antibiotics, pain medication and also recommending involvement of palliative care medicine as patient with very advanced underlying lung disease.  -Patient seen in consultation by palliative care who met with family and decision made to transition to full comfort measures. -Nonessential medications discontinued and patient currently on Pulmicort  nebs, Brovana  nebs,yupelri  for comfort. -Patient currently on a morphine  drip per palliative care  and also on Haldol  as needed. -Goal is to focus on comfort. -Palliative care following and appreciate the input and recommendations. - Will consult with palliative care medicine team for goals  of care.   2.  Leg swelling -Patient reported to admitting physician had bilateral lower extremity edema. - BNP noted at 970, no cardiomegaly, vascular congestion or edema noted on CT chest. - 2D echo with EF of 60 to 65%, NWMA, grade 1 DD, intraventricular septum is flattened in systole consistent with right ventricular pressure overload.  Mild MVR.  - No significant leg swelling noted on exam.   - Patient seen in consultation by palliative care and decision made to transition to full comfort measures.  3.  PAD -Status post angioplasty on left popliteal artery in June 2025. - No acute ischemia noted. - Was on aspirin , Plavix , Lipitor.  - Patient seen by palliative care and decision made to transition to full comfort measures.  4.  Hypertension - Was on propranolol , losartan , Norvasc  and Lasix .   - Patient seen in consultation by palliative care and patient has been transition to comfort measures.   - Antihypertensive medications discontinued by palliative care.   - BP stable.   5.  Lung nodules -Noted on CT chest. - outpatient follow-up recommended with repeat scan in 3 to 6 months. - Patient seen by palliative care and decision made to transition to comfort measures.  6.  Oral thrush - Continue nystatin  swish and swallow.  - Patient now transitioning to comfort measures.  7.  Constipation - Dulcolax suppositories daily.   8.  Dysphagia - SLP following -  9.  Hypokalemia -Likely secondary to diuresis.   -Repleted.  10.  Anxiety -Continue Xanax  3 times daily as needed.    DVT prophylaxis: Lovenox  Code Status: Full Family Communication: Updated patient, no family at bedside. Disposition: Transfer to MedSurg.  In-hospital death versus residential hospice.   Status is: Inpatient The patient will require care spanning > 2 midnights and should be moved to inpatient because: Severity of illness   Consultants:  PCCM: Dr.Olalere 06/16/2024 Palliative care: Dr. Clayton  06/17/2024  Procedures:  CT angiogram chest 06/14/2024 Chest x-ray 06/13/2024 2D echo 06/14/2024  Antimicrobials:  Anti-infectives (From admission, onward)    Start     Dose/Rate Route Frequency Ordered Stop   06/17/24 1100  azithromycin  (ZITHROMAX ) 500 mg in sodium chloride  0.9 % 250 mL IVPB  Status:  Discontinued        500 mg 250 mL/hr over 60 Minutes Intravenous Every 24 hours 06/17/24 1009 06/17/24 1124   06/15/24 1200  fluconazole  (DIFLUCAN ) IVPB 200 mg  Status:  Discontinued        200 mg 100 mL/hr over 60 Minutes Intravenous Daily 06/15/24 0957 06/17/24 1124   06/14/24 0400  cefTRIAXone  (ROCEPHIN ) 1 g in sodium chloride  0.9 % 100 mL IVPB  Status:  Discontinued        1 g 200 mL/hr over 30 Minutes Intravenous Every 24 hours 06/14/24 0349 06/17/24 1124         Subjective: Patient with eyes open, resting comfortably.  Noted to get agitated early on this morning and confused per RN and just received some Haldol .  Patient on morphine  drip.  Alert but not responding to verbal stimuli.   Objective: Vitals:   06/17/24 2000 06-26-24 0000 06-26-2024 0100 June 26, 2024 0830  BP:      Pulse: 68 72 76   Resp: 15 13 15    Temp:    ROLLEN)  94.3 F (34.6 C)  TempSrc:    Axillary  SpO2: 96% 94% (!) 88%   Weight:      Height:        Intake/Output Summary (Last 24 hours) at 06-29-24 0904 Last data filed at 06/17/2024 2059 Gross per 24 hour  Intake 13.49 ml  Output --  Net 13.49 ml   Filed Weights   06/13/24 2213 06/14/24 1129 06/17/24 0500  Weight: 38.6 kg 32.1 kg 33.4 kg    Examination:  General exam: NAD.  Oral thrush.  Frail.  Cachectic. Respiratory system: Some coarse breath sounds anterior lung fields.  No significant expiratory wheezing noted.  No increased work of breathing.  No use of accessory muscles of respiration.  On 2 L nasal cannula.  Cardiovascular system: RRR no murmurs rubs or gallops.  No JVD.  No pitting lower extremity edema.   Gastrointestinal system: Abdomen  soft, nontender, nondistended, positive bowel sounds.  No rebound.  No guarding.  Central nervous system: Alert. No focal neurological deficits. Extremities: Symmetric 5 x 5 power. Skin: No rashes, lesions or ulcers Psychiatry: Judgement and insight appear poor. Mood & affect appropriate.     Data Reviewed: I have personally reviewed following labs and imaging studies  CBC: Recent Labs  Lab 06/13/24 2311 06/14/24 0505 06/15/24 0248 06/16/24 0301 06/17/24 0248  WBC 12.0* 10.6* 5.3 8.4 11.6*  HGB 10.7* 10.3* 10.4* 9.7* 10.1*  HCT 38.8 36.5 36.3 34.0* 35.8*  MCV 97.7 96.3 96.5 96.6 96.2  PLT 353 310 308 323 302    Basic Metabolic Panel: Recent Labs  Lab 06/13/24 2311 06/14/24 0505 06/15/24 0248 06/16/24 0301 06/17/24 0248  NA 140 135 139 139 138  K 4.5 4.3 3.9 3.7 3.1*  CL 99 97* 98 100 96*  CO2 31 30 29 29  32  GLUCOSE 141* 178* 180* 129* 179*  BUN 17 14 15 20 19   CREATININE 0.69 0.53 0.53 0.52 0.47  CALCIUM  8.6* 8.5* 9.0 8.5* 8.5*  MG  --   --  1.8 2.0  --     GFR: Estimated Creatinine Clearance: 25.1 mL/min (by C-G formula based on SCr of 0.47 mg/dL).  Liver Function Tests: Recent Labs  Lab 06/14/24 0505  AST 16  ALT 10  ALKPHOS 98  BILITOT 0.5  PROT 5.9*  ALBUMIN 2.5*    CBG: No results for input(s): GLUCAP in the last 168 hours.   Recent Results (from the past 240 hours)  MRSA Next Gen by PCR, Nasal     Status: None   Collection Time: 06/14/24 12:09 PM   Specimen: Nasal Mucosa; Nasal Swab  Result Value Ref Range Status   MRSA by PCR Next Gen NOT DETECTED NOT DETECTED Final    Comment: (NOTE) The GeneXpert MRSA Assay (FDA approved for NASAL specimens only), is one component of a comprehensive MRSA colonization surveillance program. It is not intended to diagnose MRSA infection nor to guide or monitor treatment for MRSA infections. Test performance is not FDA approved in patients less than 62 years old. Performed at [facility], [address] [place], [place] [address]          Radiology Studies: DG CHEST PORT 1 VIEW Result Date: 06/16/2024 CLINICAL DATA:  Shortness of breath EXAM: PORTABLE CHEST 1 VIEW COMPARISON:  X-ray 06/13/2024 and older.  CTA chest 06/14/2024 FINDINGS: Hyperinflation. Normal cardiopericardial silhouette. Chronic lung changes. There is blunting of the costophrenic angles bilaterally. Question tiny effusions. Mild left retrocardiac opacity but  decreased from previous. IMPRESSION: Decreasing left retrocardiac opacity. Recommend continued follow-up. Question tiny pleural effusions. Hyperinflation with chronic lung changes. Electronically Signed   By: Ranell Bring M.D.   On: 06/16/2024 11:18        Scheduled Meds:  arformoterol   15 mcg Nebulization BID   bisacodyl   10 mg Rectal Daily   budesonide  (PULMICORT ) nebulizer solution  0.5 mg Nebulization BID   mouth rinse  15 mL Mouth Rinse 4 times per day   revefenacin   175 mcg Nebulization Daily   sodium chloride  flush  3 mL Intravenous Q12H   Continuous Infusions:  morphine  3 mg/hr (06/17/24 2059)     LOS: 4 days    Time spent: 40 minutes    Toribio Hummer, MD [facility]   To contact the attending provider between 7A-7P or the covering provider during after hours 7P-7A, please log into the web site [email or web address] and access using universal [place] password for that web site. If you do not have the password, please call the hospital operator.  July 05, 2024, 9:04 AM

## 2024-06-30 NOTE — Plan of Care (Signed)
  Problem: Health Behavior/Discharge Planning: Goal: Ability to manage health-related needs will improve Outcome: Adequate for Discharge   Problem: Clinical Measurements: Goal: Ability to maintain clinical measurements within normal limits will improve Outcome: Adequate for Discharge Goal: Diagnostic test results will improve Outcome: Adequate for Discharge Goal: Respiratory complications will improve Outcome: Adequate for Discharge   Problem: Activity: Goal: Risk for activity intolerance will decrease Outcome: Adequate for Discharge    Patient comfort care

## 2024-06-30 NOTE — Progress Notes (Signed)
 Medication waste  Morphine  80ml wasted, Versed  80ml wasted, with witness Medford Robins, RN

## 2024-06-30 NOTE — Progress Notes (Deleted)
 Marland Kitchen  pcc

## 2024-06-30 NOTE — Death Summary Note (Signed)
 DEATH SUMMARY   Patient Details  Name: Jessica [name] MRN: [id number] DOB: 1932/05/08 ERE:Dnluy, Garnette, MD Admission/Discharge Information   Admit Date:  2024/06/15  Date of Death: Date of Death: 06/20/2024  Time of Death: Time of Death: 1808/06/30  Length of Stay: 4   Principle Cause of death: End-stage COPD/COPD exacerbation  Hospital Diagnoses: Principal Problem:   COPD with acute exacerbation (HCC) Active Problems:   Acute respiratory distress   Chronic respiratory failure with hypoxia (HCC)   Essential hypertension   PVD (peripheral vascular disease) (HCC)   Bilateral leg edema   Lung nodules   Oral thrush   Constipation   Pressure injury of skin   Palliative care encounter   DNR (do not resuscitate)   Concern about end of life   Shortness of breath   Medication management   High risk medication use   ACP (advance care planning)   End stage COPD (HCC)   Agitation   End of life care   COPD exacerbation (HCC)   HPI Per Dr. Charlton Corky Jessica [name] is a 89 y.o. female with medical history significant for COPD, chronic Evoxac respiratory failure, hypertension, and PAD status post angioplasty in June 2025, now presenting with worsening shortness of breath and productive cough.   Patient reports 3 days of worsening shortness of breath and productive cough.  She denies any chest pain, fever, or chills associated with this.  Patient also notes that her bilateral lower extremities have been swelling for several months.  She denies orthopnea.   ED Course: Upon arrival to the ED, patient is found to be afebrile and saturating well on 3 L/min of supplemental oxygen  with tachypnea, normal HR, and stable BP.  Labs are most notable for normal creatinine, WBC 12,000, normal troponin, BNP 970, and D-dimer 1.15.  CTA chest is negative for PE but notable for advanced emphysema, and multiple lung nodules.    Patient was treated in the ED with 125 mg IV Solu-Medrol , DuoNeb, and 20 mg IV  Lasix .  Assessment and Plan: #1 acute respiratory distress on chronic respiratory failure secondary to end-stage COPD exacerbation/acute COPD exacerbation +/- acute CHF exacerbation -Patient presented with worsening shortness of breath, productive cough of greenish sputum. - CT angiogram chest done negative for PE however consistent with advanced emphysema with multiple lung nodules. -On admission VBG obtained with a pH of 7.34, pCO2 of 67, PaO2 < 31, bicarb of 36. - Patient with no significant lower extremity edema noted. - Patient noted with increased work of breathing on assessment the morning of 06/14/2024.   - Patient transferred to stepdown unit, placed on BiPAP and also received Lasix  20 mg IV x 1.   - Patient with some clinical improvement, improved work of breathing. -Patient however felt breathing gets worse with panting after breathing treatments and is insistent that she does not feel significant improvement since admission. --2D echo with EF of 60 to 65%, NWMA, grade 1 DD, intraventricular septum is flattened in systole consistent with right ventricular pressure overload.  Mild MVR.  - Was initially on Yulperi, Brovana , Mucinex , Flonase , Claritin , PPI, IV Rocephin , Xopenex  nebs, steroid taper. -Patient noted to have received IV Lasix  during the hospitalization with good urine output.  -Also noted to have been placed on BiPAP nightly and as needed. 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  PCCM with kyphoscoliosis likely causing some restriction.  - PCCM recommended continuation of  bronchodilators, empiric antibiotics, pain medication and also recommending involvement of palliative care medicine as patient with very advanced underlying lung disease.  -Patient seen in consultation by palliative care who met with family and decision made to transition to full comfort measures. -Nonessential medications discontinued  and patient maintained on Pulmicort  nebs, Brovana  nebs,yupelri  for comfort. -Patient subsequently placed on a morphine  drip as well as Haldol  as needed  - Goal was to focus on comfort.   - She was kept comfortable and patient subsequently died at 1809 hrs. on 15-Jul-2024.   May her soul rest in peace.    2.  Leg swelling -Patient reported to admitting physician had bilateral lower extremity edema. - BNP noted at 970, no cardiomegaly, vascular congestion or edema noted on CT chest. - 2D echo with EF of 60 to 65%, NWMA, grade 1 DD, intraventricular septum is flattened in systole consistent with right ventricular pressure overload.  Mild MVR.  - No significant leg swelling noted on exam.   - Patient seen in consultation by palliative care and decision made to transition to full comfort measures.   3.  PAD -Status post angioplasty on left popliteal artery in June 2025. - No acute ischemia noted. - Was on aspirin , Plavix , Lipitor.  - Patient seen by palliative care and decision made to transition to full comfort measures. -Nonessential medications were subsequently discontinued.   4.  Hypertension - Was on propranolol , losartan , Norvasc  and Lasix .   - Patient seen in consultation by palliative care and patient has been transition to comfort measures.   - Antihypertensive medications discontinued by palliative care.   - Patient was made comfortable.   5.  Lung nodules -Noted on CT chest. - outpatient follow-up recommended with repeat scan in 3 to 6 months. - Patient seen by palliative care and decision made to transition to comfort measures.   6.  Oral thrush - Placed on nystatin  swish and swallow as well as Diflucan .    7.  Constipation - Patient placed on Dulcolax suppositories daily.    8.  Dysphagia - Seen by speech therapy during the hospitalization.   - Patient was transitioned to comfort measures.   -   9.  Hypokalemia -Likely secondary to diuresis.   -Repleted.   10.   Anxiety - Patient was maintained on Xanax  3 times daily as needed.  11.  Underweight -Likely secondary to end-stage COPD/pulmonary cachexia -Patient seen by palliative care and decision made to transition to full comfort measures.         Procedures:  CT angiogram chest 06/14/2024 Chest x-ray 06/13/2024 2D echo 06/14/2024  Consultations:  PCCM: Dr.Olalere 06/16/2024 Palliative care: Dr. Clayton 06/17/2024  The results of significant diagnostics from this hospitalization (including imaging, microbiology, ancillary and laboratory) are listed below for reference.   Significant Diagnostic Studies: DG CHEST PORT 1 VIEW Result Date: 06/16/2024 CLINICAL DATA:  Shortness of breath EXAM: PORTABLE CHEST 1 VIEW COMPARISON:  X-ray 06/13/2024 and older.  CTA chest 06/14/2024 FINDINGS: Hyperinflation. Normal cardiopericardial silhouette. Chronic lung changes. There is blunting of the costophrenic angles bilaterally. Question tiny effusions. Mild left retrocardiac opacity but decreased from previous. IMPRESSION: Decreasing left retrocardiac opacity. Recommend continued follow-up. Question tiny pleural effusions. Hyperinflation with chronic lung changes. Electronically Signed   By: Ranell Bring M.D.   On: 06/16/2024 11:18   ECHOCARDIOGRAM COMPLETE Result Date: 06/14/2024    ECHOCARDIOGRAM REPORT   Patient Name:   Jessica [name] Date of Exam: 06/14/2024 Medical Rec #:  [id number]    Height:       60.0 in Accession #:    [id number]   Weight:       70.8 lb Date of Birth:  Jan 15, 1932     BSA:          1.200 m Patient Age:    89 years     BP:           142/102 mmHg Patient Gender: F            HR:           107 bpm. Exam Location:  Inpatient Procedure: 2D Echo, Color Doppler and Cardiac Doppler (Both Spectral and Color            Flow Doppler were utilized during procedure). Indications:    Dyspnea  History:        Patient has prior history of Echocardiogram examinations, most                 recent 06/28/2015. Risk  Factors:Hypertension.  Sonographer:    Benard Stallion Referring Phys: [id number] TIMOTHY S OPYD IMPRESSIONS  1. Left ventricular ejection fraction, by estimation, is 60 to 65%. The left ventricle has normal function. The left ventricle has no regional wall motion abnormalities. Left ventricular diastolic parameters are consistent with Grade I diastolic dysfunction (impaired relaxation). There is the interventricular septum is flattened in systole, consistent with right ventricular pressure overload.  2. Right ventricular systolic function is normal. The right ventricular size is normal. There is moderately elevated pulmonary artery systolic pressure.  3. The mitral valve is normal in structure. Mild mitral valve regurgitation. No evidence of mitral stenosis.  4. The aortic valve is calcified. Aortic valve regurgitation is not visualized. Aortic valve sclerosis/calcification is present, without any evidence of aortic stenosis. FINDINGS  Left Ventricle: Left ventricular ejection fraction, by estimation, is 60 to 65%. The left ventricle has normal function. The left ventricle has no regional wall motion abnormalities. The left ventricular internal cavity size was normal in size. There is  no left ventricular hypertrophy. The interventricular septum is flattened in systole, consistent with right ventricular pressure overload. Left ventricular diastolic parameters are consistent with Grade I diastolic dysfunction (impaired relaxation). Right Ventricle: The right ventricular size is normal. No increase in right ventricular wall thickness. Right ventricular systolic function is normal. There is moderately elevated pulmonary artery systolic pressure. The tricuspid regurgitant velocity is 3.43 m/s, and with an assumed right atrial pressure of 8 mmHg, the estimated right ventricular systolic pressure is 55.1 mmHg. Left Atrium: Left atrial size was normal in size. Right Atrium: Right atrial size was normal in size.  Pericardium: There is no evidence of pericardial effusion. Mitral Valve: The mitral valve is normal in structure. Mild mitral valve regurgitation. No evidence of mitral valve stenosis. Tricuspid Valve: The tricuspid valve is normal in structure. Tricuspid valve regurgitation is not demonstrated. No evidence of tricuspid stenosis. Aortic Valve: The aortic valve is calcified. Aortic valve regurgitation is not visualized. Aortic valve sclerosis/calcification is present, without any evidence of aortic stenosis. Aortic valve mean gradient measures 5.0 mmHg. Aortic valve peak gradient measures 8.4 mmHg. Aortic valve area, by VTI measures 2.13 cm. Pulmonic Valve: The pulmonic valve was normal in structure. Pulmonic valve regurgitation is not visualized. No evidence of pulmonic stenosis. Aorta: The aortic root is normal in size and structure. IAS/Shunts: No atrial level shunt detected by color flow Doppler.  LEFT VENTRICLE PLAX 2D LVIDd:  2.40 cm   Diastology LVIDs:         1.40 cm   LV e' medial:    2.83 cm/s LV PW:         0.90 cm   LV E/e' medial:  27.2 LV IVS:        0.90 cm   LV e' lateral:   4.03 cm/s LVOT diam:     2.10 cm   LV E/e' lateral: 19.1 LV SV:         66 LV SV Index:   55 LVOT Area:     3.46 cm  RIGHT VENTRICLE RV Basal diam:  3.30 cm RV Mid diam:    2.60 cm LEFT ATRIUM             Index        RIGHT ATRIUM          Index LA diam:        1.90 cm 1.58 cm/m   RA Area:     8.82 cm LA Vol (A2C):   29.3 ml 24.41 ml/m  RA Volume:   18.60 ml 15.50 ml/m LA Vol (A4C):   23.5 ml 19.58 ml/m LA Biplane Vol: 28.2 ml 23.49 ml/m  AORTIC VALVE AV Area (Vmax):    2.34 cm AV Area (Vmean):   2.20 cm AV Area (VTI):     2.13 cm AV Vmax:           145.00 cm/s AV Vmean:          99.100 cm/s AV VTI:            0.310 m AV Peak Grad:      8.4 mmHg AV Mean Grad:      5.0 mmHg LVOT Vmax:         97.90 cm/s LVOT Vmean:        63.000 cm/s LVOT VTI:          0.191 m LVOT/AV VTI ratio: 0.62  AORTA Ao Root diam: 2.60 cm  MITRAL VALVE                TRICUSPID VALVE MV Area (PHT): 2.24 cm     TR Peak grad:   47.1 mmHg MV Decel Time: 338 msec     TR Vmax:        343.00 cm/s MV E velocity: 77.10 cm/s MV A velocity: 125.00 cm/s  SHUNTS MV E/A ratio:  0.62         Systemic VTI:  0.19 m                             Systemic Diam: 2.10 cm Kardie Tobb DO Electronically signed by Dub Huntsman DO Signature Date/Time: 06/14/2024/4:08:56 PM    Final    CT Angio Chest PE W and/or Wo Contrast Result Date: 06/14/2024 EXAM: CTA CHEST AORTA 06/14/2024 12:36:03 AM TECHNIQUE: CTA of the chest was performed after the administration of intravenous contrast. Multiplanar reformatted images are provided for review. MIP images are provided for review. Automated exposure control, iterative reconstruction, and/or weight based adjustment of the mA/kV was utilized to reduce the radiation dose to as low as reasonably achievable. COMPARISON: Chest radiograph 06/13/2024 and CT chest 07/15/2020. CLINICAL HISTORY: Pulmonary embolism (PE) suspected, low to intermediate prob, positive D-dimer. Cough and SOB for the past 3 days. Patient has hx of COPD. FINDINGS: AORTA: No thoracic aortic dissection. No aneurysm. MEDIASTINUM: No mediastinal lymphadenopathy.  The heart and pericardium demonstrate no acute abnormality. Remote sternal fracture. LYMPH NODES: No mediastinal, hilar or axillary lymphadenopathy. LUNGS AND PLEURA: Negative for pulmonary embolism. Advanced emphysema. Diffuse bronchial wall thickening. Scattered mucus plugs in the left lower lobe. Biapical pleural parenchymal scarring. Multiple subpleural nodules, for example on the posterolateral left upper lobe on series 10 image 117 measuring 11 mm and 10 mm respectively, and an 11 x 8 mm nodule in the posterior right lower lobe on series 10 image 25. Scattered centrilobular micronodules and tree-in-bud opacities compatible with small airway infection/inflammation. UPPER ABDOMEN: Limited images of the upper  abdomen are unremarkable. SOFT TISSUES AND BONES: No acute bone or soft tissue abnormality. IMPRESSION: 1. No evidence of pulmonary embolism. 2. Advanced emphysema with bronchial inflammation. 3. Multiple subpleural nodules, including 11 mm and 10 mm nodules in the posterolateral left upper lobe and an 11 mm nodule in the posterior right lower lobe. Follow up CT at 3-6 months is recommended. Electronically signed by: Norman Gatlin MD 06/14/2024 12:58 AM EDT RP Workstation: [id number]   DG Chest Portable 1 View Result Date: 06/13/2024 CLINICAL DATA:  Shortness of breath and cough EXAM: PORTABLE CHEST 1 VIEW COMPARISON:  Chest x-ray 01/29/2024. FINDINGS: The lungs are hyperinflated, unchanged. There is atelectasis in the lung bases. There is blunting of the left costophrenic angle, likely scarring. No pneumothorax. Cardiomediastinal silhouette is within normal limits. No acute fractures are seen. IMPRESSION: 1. Hyperinflated lungs, likely COPD. 2. Bibasilar atelectasis. Electronically Signed   By: Greig Pique M.D.   On: 06/13/2024 23:06    Microbiology: Recent Results (from the past 240 hours)  MRSA Next Gen by PCR, Nasal     Status: None   Collection Time: 06/14/24 12:09 PM   Specimen: Nasal Mucosa; Nasal Swab  Result Value Ref Range Status   MRSA by PCR Next Gen NOT DETECTED NOT DETECTED Final    Comment: (NOTE) The GeneXpert MRSA Assay (FDA approved for NASAL specimens only), is one component of a comprehensive MRSA colonization surveillance program. It is not intended to diagnose MRSA infection nor to guide or monitor treatment for MRSA infections. Test performance is not FDA approved in patients less than 21 years old. Performed at [facility], [address], [place], [place] [address]     Time spent: 30 minutes  Signed: Toribio Hummer, MD 28-Jun-2024

## 2024-06-30 NOTE — Progress Notes (Signed)
 Changed Morphine  drip bag  waste was oml. Bag placed in sharps container.

## 2024-06-30 NOTE — Progress Notes (Signed)
PCCM will sign off 

## 2024-06-30 NOTE — Progress Notes (Signed)
 Daily Progress Note   Patient Name: Jessica [name]       Date: July 12, 2024 DOB: 06-21-1932  Age: 89 y.o. MRN#: [id number] Attending Physician: Sebastian Toribio GAILS, MD Primary Care Physician: Nichole Senior, MD Admit Date: 06/13/2024 Length of Stay: 4 days  Reason for Consultation/Follow-up: Establishing goals of care  Subjective:   CC: Patient appears agitated trying to push it daughter.  Following up regarding complex medical decision making and symptom management.  Subjective:  Reviewed EMR prior to presenting to bedside including recent documentation from hospitalist and PCCM provider.  At time of EMR review, patient receiving continuous IV morphine  infusion at 3 mg/h.  Patient required as needed doses of IV Haldol  2 mg x 1 dose recently.  Review of patient's vitals are reviewed noting decrease in SPO2 and body temperature.  Presented to ICU to see patient though patient had been transferred to floor room.  Able to discuss care with ICU RN who had been taking care of patient who noted that patient has worsening agitation despite medication.  Presented to floor bedside to see patient.  Patient's daughter and granddaughter present at bedside.  Again introduced myself as a member of the palliative medicine team.  Patient lying in bed, appears agitated and confused.  Patient trying to get out of bed and push it family and even at 1 point trying to grasp at daughter's throat.  This is very distressing for the family to see the patient like this when she is not normally like this.  Discussed with patient's work of breathing improved, will add additional medication for agitation.  Noted morphine  can be increased as needed as well.  Goal is comfort at this time.  Daughter agreeing with this plan.  Patient's grandson to be present later in afternoon.  Encouraged daughter that if oxygen  is causing agitation or once grandson gets here, would recommend removal of nasal cannula.  Daughter agreeing with  this.  Spent time answering questions as able.  Family inquiring about prognosis, expressed concern of hours to a few days.  Family again stating priority is patient's comfort at end-of-life.  Provided emotional support via active listening.  Noted palliative medicine team continue to follow with patient's medical journey.  Review of Systems Patient agitated Objective:   Vital Signs:  BP (!) 126/52 (BP Location: Right Arm)   Pulse 76   Temp 97.8 F (36.6 C) (Oral)   Resp 15   Ht 5' (1.524 m)   Wt 33.4 kg   LMP 11/30/1976   SpO2 (!) 88%   BMI 14.38 kg/m   Physical Exam: General: Agitated, cachectic, frail, ill-appearing Cardiovascular: RRR, no edema in LE b/l Respiratory: no increased work of breathing noted, not in respiratory distress Neuro: Confused, agitated  Assessment & Plan:   Assessment: Patient is an 89 year old female with a past medical history of COPD, chronic respiratory failure, hypertension, and PAD status post angioplasty June 2025 who was admitted on 06/13/2024 for management of worsening shortness of breath and productive cough x 3 days prior to admission. During hospitalization patient received management for acute respiratory failure in the setting of COPD exacerbation and possible CHF exacerbation. Despite appropriate medical interventions patient's medical status continued to worsen requiring transfer to stepdown unit. PCCM provider consulted for recommendations. Palliative medicine team consulted to assist with complex medical decision making.   Recommendations/Plan: # Complex medical decision making/goals of care:     - Discussed care with patient's daughter and granddaughter as detailed above in HPI.  Patient now confused at end-of-life.  Patient transition to full comfort focused care 06/17/2024.  Continuing to manage medications with priority of comfort at end-of-life.  Anticipate hospital death based on current evaluation.  Palliative medicine team continues  to follow along with patient's medical journey.                -Have already discontinue interventions that are no longer focused on comfort such as IV fluids, imaging, or lab work.  Focusing on symptom management of pain, dyspnea, and agitation in the setting of end-of-life care.                  Code Status: Do not attempt resuscitation (DNR) - Comfort care   # Symptom management Patient is receiving these palliative interventions for symptom management with an intent to improve quality of life.  Discontinuing oral medications as noted difficulty with patient swallowing/dysphagia at this point.                  -Pain/Dyspnea, acute in the setting of end-of-life care                Patient was not on medications for pain previously.                               - Continue with IV morphine  titratable continuous infusion with as needed bolus dosing every 15 minutes as needed.  Continue to adjust based on patient's symptom burden.                                 - Continue as needed IV morphine  1-2 mg every 1 hour as needed for breakthrough after infusion.                  -Anxiety/agitation, in the setting of end-of-life care Patient having severe worsening of agitation today when seen.   - Start titratable IV Versed  continuous infusion with bolus dosing.  Continue to adjust based on patient's symptom burden.                               - Change IV Haldol  5 mg every 4 hours as needed. Continue to adjust based on patient's symptom burden.                   -Secretions, in the setting of end-of-life care                               - Continue IV glycopyrrolate  0.2 mg every 4 hours as needed.   # Psycho-social/Spiritual Support:  - Support System: Daughter, granddaughter, and grandson - Desire for further Chaplain support:yes   # Discharge Planning:  Anticipated Hospital Death   Discussed with: Patient's daughter and granddaughter at bedside, RN, hospitalist  Thank you for allowing the  palliative care team to participate in the care Jessica [name].  Tinnie Radar, DO Palliative Care Provider PMT # [phone]  If patient remains symptomatic despite maximum doses, please call PMT at [phone] between 0700 and 1900. Outside of these hours, please call attending, as PMT does not have night coverage.

## 2024-06-30 NOTE — Plan of Care (Signed)
  Problem: Education: Goal: Knowledge of General Education information will improve Description: Including pain rating scale, medication(s)/side effects and non-pharmacologic comfort measures Outcome: Not Progressing   Problem: Health Behavior/Discharge Planning: Goal: Ability to manage health-related needs will improve Outcome: Not Progressing   Problem: Clinical Measurements: Goal: Ability to maintain clinical measurements within normal limits will improve Outcome: Not Progressing Goal: Will remain free from infection Outcome: Not Progressing Goal: Diagnostic test results will improve Outcome: Not Progressing Goal: Respiratory complications will improve Outcome: Not Progressing Goal: Cardiovascular complication will be avoided Outcome: Not Progressing   Problem: Activity: Goal: Risk for activity intolerance will decrease Outcome: Not Progressing   Problem: Nutrition: Goal: Adequate nutrition will be maintained Outcome: Not Progressing   Problem: Coping: Goal: Level of anxiety will decrease Outcome: Not Progressing   Problem: Elimination: Goal: Will not experience complications related to bowel motility Outcome: Not Progressing Goal: Will not experience complications related to urinary retention Outcome: Not Progressing   Problem: Pain Managment: Goal: General experience of comfort will improve and/or be controlled Outcome: Not Progressing   Problem: Safety: Goal: Ability to remain free from injury will improve Outcome: Not Progressing   Problem: Skin Integrity: Goal: Risk for impaired skin integrity will decrease Outcome: Not Progressing   Problem: Education: Goal: Knowledge of disease or condition will improve Outcome: Not Progressing Goal: Knowledge of the prescribed therapeutic regimen will improve Outcome: Not Progressing Goal: Individualized Educational Video(s) Outcome: Not Progressing   Problem: Activity: Goal: Ability to tolerate increased  activity will improve Outcome: Not Progressing Goal: Will verbalize the importance of balancing activity with adequate rest periods Outcome: Not Progressing   Problem: Respiratory: Goal: Ability to maintain a clear airway will improve Outcome: Not Progressing Goal: Levels of oxygenation will improve Outcome: Not Progressing Goal: Ability to maintain adequate ventilation will improve Outcome: Not Progressing   Problem: Education: Goal: Knowledge of the prescribed therapeutic regimen will improve Outcome: Not Progressing   Problem: Coping: Goal: Ability to identify and develop effective coping behavior will improve Outcome: Not Progressing   Problem: Clinical Measurements: Goal: Quality of life will improve Outcome: Not Progressing   Problem: Respiratory: Goal: Verbalizations of increased ease of respirations will increase Outcome: Not Progressing   Problem: Role Relationship: Goal: Family's ability to cope with current situation will improve Outcome: Not Progressing Goal: Ability to verbalize concerns, feelings, and thoughts to partner or family member will improve Outcome: Not Progressing   Problem: Pain Management: Goal: Satisfaction with pain management regimen will improve Outcome: Not Progressing  Comfort care pt

## 2024-06-30 DEATH — deceased

## 2024-08-01 ENCOUNTER — Encounter (HOSPITAL_COMMUNITY)

## 2024-08-01 ENCOUNTER — Encounter
